# Patient Record
Sex: Male | Born: 1940
Health system: Southern US, Community
[De-identification: ages and names within clinical notes are randomized; demographics above are authoritative.]

## PROBLEM LIST (undated history)

## (undated) DIAGNOSIS — Z950 Presence of cardiac pacemaker: Secondary | ICD-10-CM

## (undated) DIAGNOSIS — K219 Gastro-esophageal reflux disease without esophagitis: Secondary | ICD-10-CM

## (undated) DIAGNOSIS — I499 Cardiac arrhythmia, unspecified: Secondary | ICD-10-CM

## (undated) DIAGNOSIS — M353 Polymyalgia rheumatica: Secondary | ICD-10-CM

## (undated) DIAGNOSIS — R413 Other amnesia: Secondary | ICD-10-CM

## (undated) DIAGNOSIS — E7800 Pure hypercholesterolemia, unspecified: Secondary | ICD-10-CM

## (undated) DIAGNOSIS — G709 Myoneural disorder, unspecified: Secondary | ICD-10-CM

## (undated) DIAGNOSIS — G4700 Insomnia, unspecified: Secondary | ICD-10-CM

## (undated) DIAGNOSIS — I2510 Atherosclerotic heart disease of native coronary artery without angina pectoris: Secondary | ICD-10-CM

## (undated) DIAGNOSIS — I10 Essential (primary) hypertension: Secondary | ICD-10-CM

## (undated) DIAGNOSIS — I509 Heart failure, unspecified: Secondary | ICD-10-CM

## (undated) DIAGNOSIS — I4891 Unspecified atrial fibrillation: Secondary | ICD-10-CM

## (undated) DIAGNOSIS — E119 Type 2 diabetes mellitus without complications: Secondary | ICD-10-CM

## (undated) DIAGNOSIS — M1990 Unspecified osteoarthritis, unspecified site: Secondary | ICD-10-CM

## (undated) HISTORY — PX: ESOPHAGOGASTRODUODENOSCOPY: SHX1529

## (undated) HISTORY — PX: CORONARY ANGIOPLASTY WITH STENT PLACEMENT: SHX49

## (undated) HISTORY — PX: CARDIAC CATHETERIZATION: SHX172

## (undated) HISTORY — DX: Pure hypercholesterolemia, unspecified: E78.00

## (undated) HISTORY — DX: Unspecified atrial fibrillation: I48.91

## (undated) HISTORY — PX: BACK SURGERY: SHX140

## (undated) HISTORY — PX: ATRIAL FIBRILLATION ABLATION: EP1191

## (undated) HISTORY — DX: Essential (primary) hypertension: I10

## (undated) HISTORY — DX: Polymyalgia rheumatica: M35.3

## (undated) HISTORY — DX: Gastro-esophageal reflux disease without esophagitis: K21.9

## (undated) HISTORY — DX: Other amnesia: R41.3

## (undated) HISTORY — PX: TONSILLECTOMY: SUR1361

## (undated) HISTORY — DX: Insomnia, unspecified: G47.00

## (undated) HISTORY — DX: Type 2 diabetes mellitus without complications: E11.9

## (undated) HISTORY — DX: Unspecified osteoarthritis, unspecified site: M19.90

---

## 2003-08-09 ENCOUNTER — Ambulatory Visit (HOSPITAL_COMMUNITY): Admission: RE | Admit: 2003-08-09 | Discharge: 2003-08-09 | Payer: Self-pay | Admitting: Cardiology

## 2003-12-09 ENCOUNTER — Inpatient Hospital Stay (HOSPITAL_COMMUNITY): Admission: AD | Admit: 2003-12-09 | Discharge: 2003-12-12 | Payer: Self-pay | Admitting: Internal Medicine

## 2004-07-21 ENCOUNTER — Ambulatory Visit: Payer: Self-pay

## 2004-08-03 ENCOUNTER — Ambulatory Visit: Payer: Self-pay | Admitting: Internal Medicine

## 2004-08-24 ENCOUNTER — Ambulatory Visit: Payer: Self-pay | Admitting: Internal Medicine

## 2004-08-24 ENCOUNTER — Ambulatory Visit: Payer: Self-pay | Admitting: Cardiovascular Disease

## 2004-09-10 ENCOUNTER — Ambulatory Visit: Payer: Self-pay | Admitting: Internal Medicine

## 2004-10-06 ENCOUNTER — Ambulatory Visit: Payer: Self-pay | Admitting: Internal Medicine

## 2004-10-08 ENCOUNTER — Ambulatory Visit: Payer: Self-pay | Admitting: Internal Medicine

## 2004-10-16 ENCOUNTER — Ambulatory Visit: Payer: Self-pay

## 2004-10-19 ENCOUNTER — Ambulatory Visit: Payer: Self-pay | Admitting: Cardiology

## 2004-10-20 ENCOUNTER — Ambulatory Visit (HOSPITAL_COMMUNITY): Admission: RE | Admit: 2004-10-20 | Discharge: 2004-10-20 | Payer: Self-pay | Admitting: Cardiovascular Disease

## 2004-10-20 ENCOUNTER — Ambulatory Visit: Payer: Self-pay | Admitting: Cardiology

## 2004-10-22 ENCOUNTER — Ambulatory Visit: Payer: Self-pay | Admitting: Internal Medicine

## 2004-10-28 ENCOUNTER — Ambulatory Visit: Payer: Self-pay | Admitting: Cardiology

## 2004-10-28 ENCOUNTER — Encounter: Admission: RE | Admit: 2004-10-28 | Discharge: 2004-10-28 | Payer: Self-pay | Admitting: Rheumatology

## 2004-11-04 ENCOUNTER — Ambulatory Visit: Payer: Self-pay | Admitting: Internal Medicine

## 2004-11-13 ENCOUNTER — Ambulatory Visit: Payer: Self-pay | Admitting: Internal Medicine

## 2004-11-19 ENCOUNTER — Ambulatory Visit: Payer: Self-pay | Admitting: Cardiology

## 2004-12-07 ENCOUNTER — Ambulatory Visit: Payer: Self-pay | Admitting: Cardiology

## 2004-12-28 ENCOUNTER — Ambulatory Visit: Payer: Self-pay | Admitting: Cardiology

## 2005-01-04 ENCOUNTER — Ambulatory Visit: Payer: Self-pay | Admitting: Cardiology

## 2005-01-11 ENCOUNTER — Ambulatory Visit: Payer: Self-pay | Admitting: *Deleted

## 2005-01-18 ENCOUNTER — Ambulatory Visit: Payer: Self-pay | Admitting: Cardiology

## 2005-01-28 ENCOUNTER — Ambulatory Visit: Payer: Self-pay | Admitting: Cardiology

## 2005-02-02 ENCOUNTER — Ambulatory Visit: Payer: Self-pay | Admitting: *Deleted

## 2005-02-16 ENCOUNTER — Ambulatory Visit: Payer: Self-pay | Admitting: Cardiovascular Disease

## 2005-02-18 ENCOUNTER — Ambulatory Visit: Payer: Self-pay | Admitting: Internal Medicine

## 2005-02-22 ENCOUNTER — Ambulatory Visit: Payer: Self-pay | Admitting: Cardiology

## 2005-03-02 ENCOUNTER — Ambulatory Visit: Payer: Self-pay | Admitting: Cardiology

## 2005-03-23 ENCOUNTER — Ambulatory Visit: Payer: Self-pay | Admitting: Cardiology

## 2005-04-14 ENCOUNTER — Ambulatory Visit: Payer: Self-pay | Admitting: Cardiology

## 2005-05-04 ENCOUNTER — Ambulatory Visit: Payer: Self-pay | Admitting: Cardiology

## 2005-06-01 ENCOUNTER — Ambulatory Visit: Payer: Self-pay | Admitting: *Deleted

## 2005-06-29 ENCOUNTER — Ambulatory Visit: Payer: Self-pay | Admitting: Cardiology

## 2005-07-01 ENCOUNTER — Ambulatory Visit: Payer: Self-pay | Admitting: Family Medicine

## 2005-07-06 ENCOUNTER — Ambulatory Visit: Payer: Self-pay | Admitting: Cardiology

## 2005-07-27 ENCOUNTER — Ambulatory Visit: Payer: Self-pay | Admitting: Cardiology

## 2005-08-27 ENCOUNTER — Ambulatory Visit: Payer: Self-pay | Admitting: Internal Medicine

## 2005-09-20 ENCOUNTER — Ambulatory Visit: Payer: Self-pay | Admitting: Internal Medicine

## 2005-10-08 ENCOUNTER — Ambulatory Visit: Payer: Self-pay | Admitting: Cardiology

## 2005-10-11 ENCOUNTER — Ambulatory Visit: Payer: Self-pay | Admitting: Family Medicine

## 2005-11-05 ENCOUNTER — Ambulatory Visit: Payer: Self-pay | Admitting: Internal Medicine

## 2005-12-06 ENCOUNTER — Ambulatory Visit: Payer: Self-pay | Admitting: Cardiology

## 2006-01-03 ENCOUNTER — Ambulatory Visit: Payer: Self-pay | Admitting: Cardiology

## 2006-01-19 ENCOUNTER — Ambulatory Visit: Payer: Self-pay | Admitting: Family Medicine

## 2006-02-02 ENCOUNTER — Ambulatory Visit: Payer: Self-pay | Admitting: Cardiovascular Disease

## 2006-02-14 ENCOUNTER — Ambulatory Visit: Payer: Self-pay | Admitting: Cardiology

## 2006-02-28 ENCOUNTER — Ambulatory Visit: Payer: Self-pay | Admitting: Cardiology

## 2006-03-15 ENCOUNTER — Ambulatory Visit: Payer: Self-pay | Admitting: Cardiology

## 2006-03-29 ENCOUNTER — Ambulatory Visit: Payer: Self-pay | Admitting: *Deleted

## 2006-04-13 ENCOUNTER — Ambulatory Visit: Payer: Self-pay | Admitting: Cardiology

## 2006-04-28 ENCOUNTER — Ambulatory Visit: Payer: Self-pay | Admitting: Cardiology

## 2006-05-23 ENCOUNTER — Ambulatory Visit: Payer: Self-pay | Admitting: Cardiology

## 2006-06-20 ENCOUNTER — Ambulatory Visit: Payer: Self-pay | Admitting: Cardiology

## 2006-07-04 ENCOUNTER — Ambulatory Visit: Payer: Self-pay | Admitting: Cardiology

## 2006-07-08 ENCOUNTER — Ambulatory Visit: Payer: Self-pay | Admitting: Family Medicine

## 2006-07-25 ENCOUNTER — Ambulatory Visit: Payer: Self-pay | Admitting: Cardiology

## 2006-09-08 ENCOUNTER — Ambulatory Visit: Payer: Self-pay | Admitting: Family Medicine

## 2006-09-13 ENCOUNTER — Ambulatory Visit: Payer: Self-pay | Admitting: Cardiology

## 2006-09-23 ENCOUNTER — Ambulatory Visit: Payer: Self-pay | Admitting: Cardiovascular Disease

## 2006-09-30 ENCOUNTER — Encounter: Payer: Self-pay | Admitting: Family Medicine

## 2006-10-04 ENCOUNTER — Ambulatory Visit: Payer: Self-pay | Admitting: Cardiology

## 2006-10-17 ENCOUNTER — Ambulatory Visit: Payer: Self-pay | Admitting: Cardiology

## 2006-11-29 ENCOUNTER — Ambulatory Visit: Payer: Self-pay | Admitting: Cardiology

## 2006-12-13 ENCOUNTER — Ambulatory Visit: Payer: Self-pay | Admitting: Cardiology

## 2006-12-20 ENCOUNTER — Ambulatory Visit: Payer: Self-pay | Admitting: *Deleted

## 2006-12-27 ENCOUNTER — Ambulatory Visit: Payer: Self-pay | Admitting: Cardiology

## 2007-01-09 ENCOUNTER — Ambulatory Visit: Payer: Self-pay | Admitting: Family Medicine

## 2007-05-01 ENCOUNTER — Ambulatory Visit: Payer: Self-pay | Admitting: Family Medicine

## 2007-05-01 DIAGNOSIS — I4891 Unspecified atrial fibrillation: Secondary | ICD-10-CM | POA: Insufficient documentation

## 2007-05-22 DIAGNOSIS — K219 Gastro-esophageal reflux disease without esophagitis: Secondary | ICD-10-CM | POA: Insufficient documentation

## 2007-05-22 DIAGNOSIS — I10 Essential (primary) hypertension: Secondary | ICD-10-CM | POA: Insufficient documentation

## 2007-05-30 ENCOUNTER — Ambulatory Visit: Payer: Self-pay | Admitting: Family Medicine

## 2007-06-01 LAB — CONVERTED CEMR LAB
ALT: 26 units/L (ref 0–53)
AST: 28 units/L (ref 0–37)
Albumin: 4 g/dL (ref 3.5–5.2)
Alkaline Phosphatase: 71 units/L (ref 39–117)
BUN: 12 mg/dL (ref 6–23)
Basophils Absolute: 0 10*3/uL (ref 0.0–0.1)
Basophils Relative: 0.1 % (ref 0.0–1.0)
Bilirubin, Direct: 0.2 mg/dL (ref 0.0–0.3)
CO2: 30 meq/L (ref 19–32)
Calcium: 9.9 mg/dL (ref 8.4–10.5)
Chloride: 106 meq/L (ref 96–112)
Cholesterol: 236 mg/dL (ref 0–200)
Creatinine, Ser: 0.9 mg/dL (ref 0.4–1.5)
Direct LDL: 144.1 mg/dL
Eosinophils Absolute: 0 10*3/uL (ref 0.0–0.6)
Eosinophils Relative: 0.5 % (ref 0.0–5.0)
GFR calc Af Amer: 109 mL/min
GFR calc non Af Amer: 90 mL/min
Glucose, Bld: 109 mg/dL — ABNORMAL HIGH (ref 70–99)
HCT: 41.1 % (ref 39.0–52.0)
HDL: 46.3 mg/dL (ref >39.0)
Hemoglobin: 14.4 g/dL (ref 13.0–17.0)
Lymphocytes Relative: 10.3 % — ABNORMAL LOW (ref 12.0–46.0)
MCHC: 35.1 g/dL (ref 30.0–36.0)
MCV: 100.7 fL — ABNORMAL HIGH (ref 78.0–100.0)
Monocytes Absolute: 0.5 10*3/uL (ref 0.2–0.7)
Monocytes Relative: 7.6 % (ref 3.0–11.0)
Neutro Abs: 5.8 10*3/uL (ref 1.4–7.7)
Neutrophils Relative %: 81.5 % — ABNORMAL HIGH (ref 43.0–77.0)
PSA: 0.69 ng/mL (ref 0.10–4.00)
Platelets: 174 10*3/uL (ref 150–400)
Potassium: 4.6 meq/L (ref 3.5–5.1)
RBC: 4.08 M/uL — ABNORMAL LOW (ref 4.22–5.81)
RDW: 12.1 % (ref 11.5–14.6)
Sodium: 142 meq/L (ref 135–145)
TSH: 0.76 microintl units/mL (ref 0.35–5.50)
Total Bilirubin: 1.1 mg/dL (ref 0.3–1.2)
Total CHOL/HDL Ratio: 5.1
Total Protein: 6 g/dL (ref 6.0–8.3)
Triglycerides: 162 mg/dL — ABNORMAL HIGH (ref 0–149)
VLDL: 32 mg/dL (ref 0–40)
WBC: 7 10*3/uL (ref 4.5–10.5)

## 2007-06-05 ENCOUNTER — Ambulatory Visit: Payer: Self-pay | Admitting: Family Medicine

## 2007-06-05 DIAGNOSIS — M353 Polymyalgia rheumatica: Secondary | ICD-10-CM | POA: Insufficient documentation

## 2007-06-09 ENCOUNTER — Ambulatory Visit: Payer: Self-pay | Admitting: Family Medicine

## 2007-06-27 ENCOUNTER — Ambulatory Visit: Payer: Self-pay | Admitting: Gastroenterology

## 2007-07-07 ENCOUNTER — Encounter: Payer: Self-pay | Admitting: Family Medicine

## 2007-07-07 ENCOUNTER — Encounter (INDEPENDENT_AMBULATORY_CARE_PROVIDER_SITE_OTHER): Payer: Self-pay | Admitting: *Deleted

## 2007-07-07 ENCOUNTER — Ambulatory Visit: Payer: Self-pay | Admitting: Gastroenterology

## 2007-07-07 ENCOUNTER — Encounter: Payer: Self-pay | Admitting: Gastroenterology

## 2007-07-07 DIAGNOSIS — D126 Benign neoplasm of colon, unspecified: Secondary | ICD-10-CM | POA: Insufficient documentation

## 2007-08-11 ENCOUNTER — Ambulatory Visit: Payer: Self-pay | Admitting: Family Medicine

## 2008-01-09 ENCOUNTER — Telehealth: Payer: Self-pay | Admitting: Family Medicine

## 2008-01-26 ENCOUNTER — Ambulatory Visit: Payer: Self-pay | Admitting: Family Medicine

## 2008-01-26 DIAGNOSIS — N318 Other neuromuscular dysfunction of bladder: Secondary | ICD-10-CM | POA: Insufficient documentation

## 2008-03-14 ENCOUNTER — Telehealth: Payer: Self-pay | Admitting: Family Medicine

## 2008-03-26 ENCOUNTER — Ambulatory Visit: Payer: Self-pay | Admitting: Family Medicine

## 2008-03-26 LAB — CONVERTED CEMR LAB
INR: 2.7
Prothrombin Time: 19.9 s

## 2008-04-25 ENCOUNTER — Ambulatory Visit: Payer: Self-pay | Admitting: Family Medicine

## 2008-04-25 LAB — CONVERTED CEMR LAB
INR: 3.1
Prothrombin Time: 21.3 s

## 2008-05-16 ENCOUNTER — Ambulatory Visit: Payer: Self-pay | Admitting: Family Medicine

## 2008-05-16 LAB — CONVERTED CEMR LAB
INR: 2
Prothrombin Time: 17.3 s

## 2008-05-30 ENCOUNTER — Ambulatory Visit: Payer: Self-pay | Admitting: Family Medicine

## 2008-05-30 LAB — CONVERTED CEMR LAB
INR: 2.2
Prothrombin Time: 18.1 s

## 2008-06-07 ENCOUNTER — Ambulatory Visit: Payer: Self-pay | Admitting: Family Medicine

## 2008-06-13 ENCOUNTER — Telehealth (INDEPENDENT_AMBULATORY_CARE_PROVIDER_SITE_OTHER): Payer: Self-pay | Admitting: *Deleted

## 2008-06-27 ENCOUNTER — Ambulatory Visit: Payer: Self-pay | Admitting: Family Medicine

## 2008-06-27 LAB — CONVERTED CEMR LAB
INR: 1.8
Prothrombin Time: 16.6 s

## 2008-07-11 ENCOUNTER — Ambulatory Visit: Payer: Self-pay | Admitting: Family Medicine

## 2008-07-11 LAB — CONVERTED CEMR LAB
INR: 2.6
Prothrombin Time: 19.7 s

## 2008-07-19 ENCOUNTER — Encounter: Payer: Self-pay | Admitting: Family Medicine

## 2008-07-19 ENCOUNTER — Encounter: Admission: RE | Admit: 2008-07-19 | Discharge: 2008-07-19 | Payer: Self-pay | Admitting: Family Medicine

## 2008-08-08 ENCOUNTER — Ambulatory Visit: Payer: Self-pay | Admitting: Family Medicine

## 2008-08-08 LAB — CONVERTED CEMR LAB
INR: 1.5
Prothrombin Time: 14.9 s

## 2008-08-22 ENCOUNTER — Ambulatory Visit: Payer: Self-pay | Admitting: Family Medicine

## 2008-08-22 LAB — CONVERTED CEMR LAB
INR: 2.6
Prothrombin Time: 19.7 s

## 2008-09-13 ENCOUNTER — Ambulatory Visit: Payer: Self-pay | Admitting: Family Medicine

## 2008-09-13 DIAGNOSIS — E785 Hyperlipidemia, unspecified: Secondary | ICD-10-CM | POA: Insufficient documentation

## 2008-09-17 LAB — CONVERTED CEMR LAB
ALT: 32 units/L (ref 0–53)
AST: 31 units/L (ref 0–37)
Albumin: 4.2 g/dL (ref 3.5–5.2)
Alkaline Phosphatase: 63 units/L (ref 39–117)
Bilirubin, Direct: 0.2 mg/dL (ref 0.0–0.3)
Cholesterol: 176 mg/dL (ref 0–200)
Creatinine,U: 178.4 mg/dL
HDL: 43.1 mg/dL (ref >39.0)
Hgb A1c MFr Bld: 6.2 % — ABNORMAL HIGH (ref 4.6–6.0)
LDL Cholesterol: 101 mg/dL — ABNORMAL HIGH (ref 0–99)
Microalb Creat Ratio: 8.4 mg/g (ref 0.0–30.0)
Microalb, Ur: 1.5 mg/dL (ref 0.0–1.9)
Total Bilirubin: 1.2 mg/dL (ref 0.3–1.2)
Total CHOL/HDL Ratio: 4.1
Total Protein: 6.3 g/dL (ref 6.0–8.3)
Triglycerides: 157 mg/dL — ABNORMAL HIGH (ref 0–149)
VLDL: 31 mg/dL (ref 0–40)

## 2008-09-19 ENCOUNTER — Ambulatory Visit: Payer: Self-pay | Admitting: Family Medicine

## 2008-09-19 LAB — CONVERTED CEMR LAB
INR: 2.7
Prothrombin Time: 19.9 s

## 2008-10-14 ENCOUNTER — Ambulatory Visit: Payer: Self-pay | Admitting: Family Medicine

## 2008-10-14 LAB — CONVERTED CEMR LAB
INR: 2.3
Prothrombin Time: 18.6 s

## 2008-11-05 ENCOUNTER — Encounter: Payer: Self-pay | Admitting: Family Medicine

## 2008-11-18 ENCOUNTER — Ambulatory Visit: Payer: Self-pay | Admitting: Family Medicine

## 2008-11-18 LAB — CONVERTED CEMR LAB
INR: 2
Prothrombin Time: 17.3 s

## 2008-12-12 ENCOUNTER — Ambulatory Visit: Payer: Self-pay | Admitting: Family Medicine

## 2008-12-12 LAB — CONVERTED CEMR LAB
INR: 1.6
Prothrombin Time: 15.4 s

## 2008-12-18 ENCOUNTER — Encounter: Payer: Self-pay | Admitting: Family Medicine

## 2009-01-03 ENCOUNTER — Ambulatory Visit: Payer: Self-pay | Admitting: Family Medicine

## 2009-01-03 LAB — CONVERTED CEMR LAB
INR: 2
Prothrombin Time: 17.6 s

## 2009-02-06 ENCOUNTER — Ambulatory Visit: Payer: Self-pay | Admitting: Family Medicine

## 2009-02-06 LAB — CONVERTED CEMR LAB
INR: 1.4
Prothrombin Time: 14.3 s

## 2009-04-07 ENCOUNTER — Ambulatory Visit: Payer: Self-pay | Admitting: Family Medicine

## 2009-04-07 ENCOUNTER — Telehealth (INDEPENDENT_AMBULATORY_CARE_PROVIDER_SITE_OTHER): Payer: Self-pay | Admitting: *Deleted

## 2009-04-07 LAB — CONVERTED CEMR LAB
INR: 2
Prothrombin Time: 17.6 s

## 2009-05-07 ENCOUNTER — Ambulatory Visit: Payer: Self-pay | Admitting: Family Medicine

## 2009-05-07 LAB — CONVERTED CEMR LAB
INR: 3.1
Prothrombin Time: 18.6 s

## 2009-05-09 ENCOUNTER — Ambulatory Visit: Payer: Self-pay | Admitting: Family Medicine

## 2009-05-13 ENCOUNTER — Encounter: Payer: Self-pay | Admitting: Family Medicine

## 2009-05-14 LAB — CONVERTED CEMR LAB
ALT: 58 units/L — ABNORMAL HIGH (ref 0–53)
AST: 42 units/L — ABNORMAL HIGH (ref 0–37)
Albumin: 4.1 g/dL (ref 3.5–5.2)
Alkaline Phosphatase: 86 units/L (ref 39–117)
Bilirubin, Direct: 0.1 mg/dL (ref 0.0–0.3)
Cholesterol: 224 mg/dL — ABNORMAL HIGH (ref 0–200)
Creatinine,U: 119.1 mg/dL
Direct LDL: 145.1 mg/dL
HDL: 42.9 mg/dL (ref >39.00)
Hgb A1c MFr Bld: 5.7 % (ref 4.6–6.5)
Microalb Creat Ratio: 8.4 mg/g (ref 0.0–30.0)
Microalb, Ur: 1 mg/dL (ref 0.0–1.9)
Total Bilirubin: 1.2 mg/dL (ref 0.3–1.2)
Total CHOL/HDL Ratio: 5
Total Protein: 6.4 g/dL (ref 6.0–8.3)
Triglycerides: 183 mg/dL — ABNORMAL HIGH (ref 0.0–149.0)
VLDL: 36.6 mg/dL (ref 0.0–40.0)

## 2009-05-20 ENCOUNTER — Telehealth: Payer: Self-pay | Admitting: Family Medicine

## 2009-05-29 ENCOUNTER — Ambulatory Visit: Payer: Self-pay | Admitting: Family Medicine

## 2009-05-29 LAB — CONVERTED CEMR LAB
INR: 3
Prothrombin Time: 21 s

## 2009-06-18 ENCOUNTER — Ambulatory Visit: Payer: Self-pay | Admitting: Family Medicine

## 2009-06-26 ENCOUNTER — Ambulatory Visit: Payer: Self-pay | Admitting: Family Medicine

## 2009-06-26 LAB — CONVERTED CEMR LAB
INR: 1.6
Prothrombin Time: 14.7 s

## 2009-07-10 ENCOUNTER — Ambulatory Visit: Payer: Self-pay | Admitting: Family Medicine

## 2009-07-11 ENCOUNTER — Telehealth: Payer: Self-pay | Admitting: Family Medicine

## 2009-07-15 ENCOUNTER — Ambulatory Visit: Payer: Self-pay | Admitting: Family Medicine

## 2009-07-15 LAB — CONVERTED CEMR LAB
INR: 2
Prothrombin Time: 17.6 s

## 2009-08-15 ENCOUNTER — Ambulatory Visit: Payer: Self-pay | Admitting: Family Medicine

## 2009-08-15 LAB — CONVERTED CEMR LAB
INR: 1.8
Prothrombin Time: 16.7 s

## 2009-09-02 ENCOUNTER — Encounter: Payer: Self-pay | Admitting: Family Medicine

## 2009-09-02 ENCOUNTER — Ambulatory Visit: Payer: Self-pay | Admitting: Family Medicine

## 2009-09-02 LAB — CONVERTED CEMR LAB
INR: 3.95
INR: 3.95 — ABNORMAL HIGH (ref <1.50)
Prothrombin Time: 28.1 s
Prothrombin Time: 28.1 s — ABNORMAL HIGH (ref 10.2–14.0)

## 2009-09-17 ENCOUNTER — Ambulatory Visit: Payer: Self-pay | Admitting: Family Medicine

## 2009-09-17 DIAGNOSIS — R071 Chest pain on breathing: Secondary | ICD-10-CM | POA: Insufficient documentation

## 2009-09-17 DIAGNOSIS — K4090 Unilateral inguinal hernia, without obstruction or gangrene, not specified as recurrent: Secondary | ICD-10-CM | POA: Insufficient documentation

## 2009-09-26 ENCOUNTER — Encounter: Payer: Self-pay | Admitting: Family Medicine

## 2009-09-29 ENCOUNTER — Encounter: Admission: RE | Admit: 2009-09-29 | Discharge: 2009-09-29 | Payer: Self-pay | Admitting: Surgery

## 2009-10-01 ENCOUNTER — Encounter: Payer: Self-pay | Admitting: Family Medicine

## 2009-10-02 ENCOUNTER — Encounter: Admission: RE | Admit: 2009-10-02 | Discharge: 2009-10-02 | Payer: Self-pay | Admitting: Surgery

## 2009-10-03 ENCOUNTER — Ambulatory Visit: Payer: Self-pay | Admitting: Family Medicine

## 2009-10-03 LAB — CONVERTED CEMR LAB
INR: 2.6
Prothrombin Time: 19.1 s

## 2009-10-09 ENCOUNTER — Encounter: Payer: Self-pay | Admitting: Family Medicine

## 2009-10-10 ENCOUNTER — Telehealth (INDEPENDENT_AMBULATORY_CARE_PROVIDER_SITE_OTHER): Payer: Self-pay | Admitting: *Deleted

## 2009-10-14 ENCOUNTER — Encounter: Payer: Self-pay | Admitting: Family Medicine

## 2009-10-31 ENCOUNTER — Ambulatory Visit: Payer: Self-pay | Admitting: Family Medicine

## 2009-10-31 LAB — CONVERTED CEMR LAB
INR: 2.9
Prothrombin Time: 20.5 s

## 2009-11-07 ENCOUNTER — Ambulatory Visit: Payer: Self-pay | Admitting: Family Medicine

## 2009-11-07 LAB — CONVERTED CEMR LAB: Blood Glucose, Fingerstick: 136

## 2009-11-10 ENCOUNTER — Ambulatory Visit: Payer: Self-pay | Admitting: Family Medicine

## 2009-11-10 DIAGNOSIS — IMO0002 Reserved for concepts with insufficient information to code with codable children: Secondary | ICD-10-CM | POA: Insufficient documentation

## 2009-11-11 ENCOUNTER — Telehealth: Payer: Self-pay | Admitting: Family Medicine

## 2009-11-11 LAB — CONVERTED CEMR LAB
ALT: 26 units/L (ref 0–53)
AST: 26 units/L (ref 0–37)
Albumin: 4.1 g/dL (ref 3.5–5.2)
Alkaline Phosphatase: 90 units/L (ref 39–117)
BUN: 16 mg/dL (ref 6–23)
Basophils Absolute: 0 10*3/uL (ref 0.0–0.1)
Basophils Relative: 0.2 % (ref 0.0–3.0)
Bilirubin, Direct: 0.1 mg/dL (ref 0.0–0.3)
CO2: 33 meq/L — ABNORMAL HIGH (ref 19–32)
Calcium: 9.1 mg/dL (ref 8.4–10.5)
Chloride: 105 meq/L (ref 96–112)
Creatinine, Ser: 0.8 mg/dL (ref 0.4–1.5)
Eosinophils Absolute: 0.1 10*3/uL (ref 0.0–0.7)
Eosinophils Relative: 0.7 % (ref 0.0–5.0)
GFR calc non Af Amer: 101.81 mL/min (ref >60)
Glucose, Bld: 106 mg/dL — ABNORMAL HIGH (ref 70–99)
HCT: 40.9 % (ref 39.0–52.0)
Hemoglobin: 14 g/dL (ref 13.0–17.0)
Hgb A1c MFr Bld: 5.7 % (ref 4.6–6.5)
Lymphocytes Relative: 14.5 % (ref 12.0–46.0)
Lymphs Abs: 1.2 10*3/uL (ref 0.7–4.0)
MCHC: 34.2 g/dL (ref 30.0–36.0)
MCV: 102.3 fL — ABNORMAL HIGH (ref 78.0–100.0)
Monocytes Absolute: 0.7 10*3/uL (ref 0.1–1.0)
Monocytes Relative: 9.1 % (ref 3.0–12.0)
Neutro Abs: 6 10*3/uL (ref 1.4–7.7)
Neutrophils Relative %: 75.5 % (ref 43.0–77.0)
Platelets: 163 10*3/uL (ref 150.0–400.0)
Potassium: 4.6 meq/L (ref 3.5–5.1)
RBC: 4 M/uL — ABNORMAL LOW (ref 4.22–5.81)
RDW: 12.8 % (ref 11.5–14.6)
Sodium: 142 meq/L (ref 135–145)
TSH: 0.46 microintl units/mL (ref 0.35–5.50)
Total Bilirubin: 0.6 mg/dL (ref 0.3–1.2)
Total Protein: 6.1 g/dL (ref 6.0–8.3)
Vitamin B-12: 360 pg/mL (ref 211–911)
WBC: 8 10*3/uL (ref 4.5–10.5)

## 2009-11-24 ENCOUNTER — Telehealth (INDEPENDENT_AMBULATORY_CARE_PROVIDER_SITE_OTHER): Payer: Self-pay | Admitting: *Deleted

## 2009-11-28 ENCOUNTER — Ambulatory Visit (HOSPITAL_COMMUNITY): Admission: RE | Admit: 2009-11-28 | Discharge: 2009-11-28 | Payer: Self-pay | Admitting: Surgery

## 2009-11-28 HISTORY — PX: INGUINAL HERNIA REPAIR: SUR1180

## 2009-12-09 ENCOUNTER — Ambulatory Visit: Payer: Self-pay | Admitting: Family Medicine

## 2009-12-09 LAB — CONVERTED CEMR LAB
INR: 1.7
Prothrombin Time: 16.1 s

## 2009-12-11 ENCOUNTER — Encounter: Payer: Self-pay | Admitting: Family Medicine

## 2010-01-06 ENCOUNTER — Ambulatory Visit (HOSPITAL_COMMUNITY): Admission: RE | Admit: 2010-01-06 | Discharge: 2010-01-06 | Payer: Self-pay | Admitting: Orthopedic Surgery

## 2010-01-21 ENCOUNTER — Ambulatory Visit: Payer: Self-pay | Admitting: Family Medicine

## 2010-01-21 LAB — CONVERTED CEMR LAB
INR: 2.1
Prothrombin Time: 17.8 s

## 2010-02-03 ENCOUNTER — Telehealth: Payer: Self-pay | Admitting: Family Medicine

## 2010-02-03 HISTORY — PX: KNEE ARTHROSCOPY: SHX127

## 2010-03-12 ENCOUNTER — Ambulatory Visit: Payer: Self-pay | Admitting: Family Medicine

## 2010-03-12 LAB — CONVERTED CEMR LAB: INR: 2.6

## 2010-05-07 ENCOUNTER — Ambulatory Visit: Payer: Self-pay | Admitting: Family Medicine

## 2010-05-07 LAB — CONVERTED CEMR LAB: INR: 2.6

## 2010-07-13 ENCOUNTER — Encounter: Payer: Self-pay | Admitting: Family Medicine

## 2010-07-13 ENCOUNTER — Ambulatory Visit: Payer: Self-pay | Admitting: Family Medicine

## 2010-07-13 DIAGNOSIS — N401 Enlarged prostate with lower urinary tract symptoms: Secondary | ICD-10-CM

## 2010-07-13 DIAGNOSIS — N138 Other obstructive and reflux uropathy: Secondary | ICD-10-CM | POA: Insufficient documentation

## 2010-07-14 ENCOUNTER — Encounter: Payer: Self-pay | Admitting: Family Medicine

## 2010-07-15 LAB — CONVERTED CEMR LAB
ALT: 21 units/L (ref 0–53)
AST: 26 units/L (ref 0–37)
Albumin: 4.2 g/dL (ref 3.5–5.2)
Alkaline Phosphatase: 86 units/L (ref 39–117)
BUN: 16 mg/dL (ref 6–23)
Basophils Absolute: 0 10*3/uL (ref 0.0–0.1)
Basophils Relative: 0.6 % (ref 0.0–3.0)
Bilirubin, Direct: 0.1 mg/dL (ref 0.0–0.3)
CO2: 28 meq/L (ref 19–32)
Calcium: 9.7 mg/dL (ref 8.4–10.5)
Chloride: 107 meq/L (ref 96–112)
Cholesterol: 212 mg/dL — ABNORMAL HIGH (ref 0–200)
Creatinine, Ser: 0.9 mg/dL (ref 0.4–1.5)
Creatinine,U: 59.2 mg/dL
Direct LDL: 146.8 mg/dL
Eosinophils Absolute: 0.2 10*3/uL (ref 0.0–0.7)
Eosinophils Relative: 2.7 % (ref 0.0–5.0)
GFR calc non Af Amer: 91.03 mL/min (ref >60)
Glucose, Bld: 84 mg/dL (ref 70–99)
HCT: 41.9 % (ref 39.0–52.0)
HDL: 49.2 mg/dL (ref >39.00)
Hemoglobin: 14.5 g/dL (ref 13.0–17.0)
Hgb A1c MFr Bld: 5.7 % (ref 4.6–6.5)
Lymphocytes Relative: 21.3 % (ref 12.0–46.0)
Lymphs Abs: 1.2 10*3/uL (ref 0.7–4.0)
MCHC: 34.7 g/dL (ref 30.0–36.0)
MCV: 100.8 fL — ABNORMAL HIGH (ref 78.0–100.0)
Microalb Creat Ratio: 4.1 mg/g (ref 0.0–30.0)
Microalb, Ur: 2.4 mg/dL — ABNORMAL HIGH (ref 0.0–1.9)
Monocytes Absolute: 0.7 10*3/uL (ref 0.1–1.0)
Monocytes Relative: 12.6 % — ABNORMAL HIGH (ref 3.0–12.0)
Neutro Abs: 3.7 10*3/uL (ref 1.4–7.7)
Neutrophils Relative %: 62.8 % (ref 43.0–77.0)
PSA: 0.91 ng/mL (ref 0.10–4.00)
Platelets: 166 10*3/uL (ref 150.0–400.0)
Potassium: 4.1 meq/L (ref 3.5–5.1)
RBC: 4.16 M/uL — ABNORMAL LOW (ref 4.22–5.81)
RDW: 13.1 % (ref 11.5–14.6)
Sodium: 142 meq/L (ref 135–145)
TSH: 1.81 microintl units/mL (ref 0.35–5.50)
Total Bilirubin: 1 mg/dL (ref 0.3–1.2)
Total CHOL/HDL Ratio: 4
Total Protein: 6.5 g/dL (ref 6.0–8.3)
Triglycerides: 91 mg/dL (ref 0.0–149.0)
VLDL: 18.2 mg/dL (ref 0.0–40.0)
WBC: 5.8 10*3/uL (ref 4.5–10.5)

## 2010-07-24 ENCOUNTER — Encounter: Payer: Self-pay | Admitting: Family Medicine

## 2010-07-28 ENCOUNTER — Ambulatory Visit: Payer: Self-pay | Admitting: Family Medicine

## 2010-07-28 LAB — CONVERTED CEMR LAB: INR: 1.7

## 2010-08-12 ENCOUNTER — Ambulatory Visit: Payer: Self-pay | Admitting: Family Medicine

## 2010-08-12 LAB — CONVERTED CEMR LAB: INR: 2.5

## 2010-09-22 ENCOUNTER — Encounter: Payer: Self-pay | Admitting: Family Medicine

## 2010-09-23 ENCOUNTER — Encounter
Admission: RE | Admit: 2010-09-23 | Discharge: 2010-09-23 | Payer: Self-pay | Source: Home / Self Care | Attending: Rheumatology | Admitting: Rheumatology

## 2010-09-25 ENCOUNTER — Telehealth: Payer: Self-pay | Admitting: Family Medicine

## 2010-09-29 ENCOUNTER — Encounter: Payer: Self-pay | Admitting: Family Medicine

## 2010-10-04 LAB — CONVERTED CEMR LAB
ALT: 30 units/L (ref 0–53)
ALT: 33 units/L (ref 0–53)
AST: 25 units/L (ref 0–37)
AST: 33 units/L (ref 0–37)
Albumin: 3.9 g/dL (ref 3.5–5.2)
Albumin: 4.2 g/dL (ref 3.5–5.2)
Alkaline Phosphatase: 60 units/L (ref 39–117)
Alkaline Phosphatase: 89 units/L (ref 39–117)
BUN: 10 mg/dL (ref 6–23)
BUN: 11 mg/dL (ref 6–23)
Basophils Absolute: 0 10*3/uL (ref 0.0–0.1)
Basophils Absolute: 0 10*3/uL (ref 0.0–0.1)
Basophils Relative: 0 % (ref 0.0–3.0)
Basophils Relative: 0.3 % (ref 0.0–3.0)
Bilirubin, Direct: 0.1 mg/dL (ref 0.0–0.3)
Bilirubin, Direct: 0.1 mg/dL (ref 0.0–0.3)
CO2: 31 meq/L (ref 19–32)
CO2: 33 meq/L — ABNORMAL HIGH (ref 19–32)
Calcium: 10 mg/dL (ref 8.4–10.5)
Calcium: 9.7 mg/dL (ref 8.4–10.5)
Chloride: 103 meq/L (ref 96–112)
Chloride: 106 meq/L (ref 96–112)
Cholesterol: 157 mg/dL (ref 0–200)
Cholesterol: 273 mg/dL (ref 0–200)
Creatinine, Ser: 0.9 mg/dL (ref 0.4–1.5)
Creatinine, Ser: 0.9 mg/dL (ref 0.4–1.5)
Creatinine,U: 91.4 mg/dL
Direct LDL: 164.4 mg/dL
Eosinophils Absolute: 0 10*3/uL (ref 0.0–0.7)
Eosinophils Absolute: 0.1 10*3/uL (ref 0.0–0.7)
Eosinophils Relative: 0.2 % (ref 0.0–5.0)
Eosinophils Relative: 1.3 % (ref 0.0–5.0)
GFR calc Af Amer: 108 mL/min
GFR calc non Af Amer: 88.96 mL/min (ref >60)
GFR calc non Af Amer: 89 mL/min
Glucose, Bld: 102 mg/dL — ABNORMAL HIGH (ref 70–99)
Glucose, Bld: 140 mg/dL — ABNORMAL HIGH (ref 70–99)
HCT: 42.5 % (ref 39.0–52.0)
HCT: 42.7 % (ref 39.0–52.0)
HDL: 49.2 mg/dL (ref >39.00)
HDL: 56.9 mg/dL (ref >39.0)
Hemoglobin: 14.7 g/dL (ref 13.0–17.0)
Hemoglobin: 15 g/dL (ref 13.0–17.0)
Hgb A1c MFr Bld: 5.7 % (ref 4.6–6.5)
LDL Cholesterol: 82 mg/dL (ref 0–99)
Lymphocytes Relative: 12.4 % (ref 12.0–46.0)
Lymphocytes Relative: 3.8 % — ABNORMAL LOW (ref 12.0–46.0)
Lymphs Abs: 0.9 10*3/uL (ref 0.7–4.0)
MCHC: 34.4 g/dL (ref 30.0–36.0)
MCHC: 35.3 g/dL (ref 30.0–36.0)
MCV: 101.7 fL — ABNORMAL HIGH (ref 78.0–100.0)
MCV: 103.4 fL — ABNORMAL HIGH (ref 78.0–100.0)
Microalb Creat Ratio: 16.4 mg/g (ref 0.0–30.0)
Microalb, Ur: 1.5 mg/dL (ref 0.0–1.9)
Monocytes Absolute: 0.5 10*3/uL (ref 0.1–1.0)
Monocytes Absolute: 0.6 10*3/uL (ref 0.1–1.0)
Monocytes Relative: 3.6 % (ref 3.0–12.0)
Monocytes Relative: 9 % (ref 3.0–12.0)
Neutro Abs: 12.7 10*3/uL — ABNORMAL HIGH (ref 1.4–7.7)
Neutro Abs: 5.4 10*3/uL (ref 1.4–7.7)
Neutrophils Relative %: 77.3 % — ABNORMAL HIGH (ref 43.0–77.0)
Neutrophils Relative %: 92.1 % — ABNORMAL HIGH (ref 43.0–77.0)
PSA: 0.71 ng/mL (ref 0.10–4.00)
PSA: 0.84 ng/mL (ref 0.10–4.00)
Platelets: 168 10*3/uL (ref 150–400)
Platelets: 174 10*3/uL (ref 150.0–400.0)
Potassium: 4.2 meq/L (ref 3.5–5.1)
Potassium: 4.4 meq/L (ref 3.5–5.1)
RBC: 4.13 M/uL — ABNORMAL LOW (ref 4.22–5.81)
RBC: 4.18 M/uL — ABNORMAL LOW (ref 4.22–5.81)
RDW: 12.2 % (ref 11.5–14.6)
RDW: 12.5 % (ref 11.5–14.6)
Sodium: 140 meq/L (ref 135–145)
Sodium: 144 meq/L (ref 135–145)
TSH: 0.56 microintl units/mL (ref 0.35–5.50)
TSH: 0.94 microintl units/mL (ref 0.35–5.50)
Total Bilirubin: 0.9 mg/dL (ref 0.3–1.2)
Total Bilirubin: 1.3 mg/dL — ABNORMAL HIGH (ref 0.3–1.2)
Total CHOL/HDL Ratio: 3
Total CHOL/HDL Ratio: 4.8
Total Protein: 6.2 g/dL (ref 6.0–8.3)
Total Protein: 6.7 g/dL (ref 6.0–8.3)
Triglycerides: 130 mg/dL (ref 0.0–149.0)
Triglycerides: 192 mg/dL — ABNORMAL HIGH (ref 0–149)
VLDL: 26 mg/dL (ref 0.0–40.0)
VLDL: 38 mg/dL (ref 0–40)
WBC: 13.7 10*3/uL — ABNORMAL HIGH (ref 4.5–10.5)
WBC: 7 10*3/uL (ref 4.5–10.5)

## 2010-10-06 NOTE — Assessment & Plan Note (Signed)
Summary: pt fell/hurt back/ribs/stomach/cjr   Vital Signs:  Patient profile:   70 year old male Weight:      175 pounds Temp:     97.9 degrees F oral Pulse rate:   76 / minute BP sitting:   118 / 66  (left arm) Cuff size:   regular  Vitals Entered By: Alfred Levins, CMA (September 17, 2009 11:48 AM) CC: fell down on 09/08/09 and now his stomach, back, and chest hurt   History of Present Illness: Here with his wife for several reasons. On 09-08-09 he was ice skating and he fell forwards, landing on his chest and abdomen. Since then he has had severe sharp pains in his anterior chest, both sides of his chest, and in the middle of his back. No SOB, but it hurts to breathe deeply. Also he has had a painful lump in the right groin since then. Urinates normally. On Tylenol for the pain.   Allergies: 1)  ! Codeine  Past History:  Past Medical History: Reviewed history from 09/13/2008 and no changes required. High Cholesterol Insomnia Urticaria GERD Hypertension Atrial fibrillation (Dr. Sampson Goon at [facility] and Dr. Ladona Ridgel at [facility]) - resolved. [facility]  polymyalgia rheumatica (Dr. Kellie Simmering) Diabetes mellitus, type II  Past Surgical History: Reviewed history from 06/05/2007 and no changes required. cardiac ablation x two  Review of Systems  The patient denies anorexia, fever, weight loss, weight gain, vision loss, decreased hearing, hoarseness, syncope, dyspnea on exertion, peripheral edema, prolonged cough, headaches, hemoptysis, melena, hematochezia, severe indigestion/heartburn, hematuria, incontinence, genital sores, muscle weakness, suspicious skin lesions, transient blindness, difficulty walking, depression, unusual weight change, abnormal bleeding, enlarged lymph nodes, angioedema, breast masses, and testicular masses.    Physical Exam  General:  in pain, alert Neck:  No deformities, masses, or tenderness noted. Lungs:  Normal respiratory effort, chest expands  symmetrically. Lungs are clear to auscultation, no crackles or wheezes. Heart:  Normal rate and regular rhythm. S1 and S2 normal without gallop, murmur, click, rub or other extra sounds. Abdomen:  soft, normal bowel sounds, no distention, no masses, no guarding, no rigidity, no rebound tenderness, no abdominal hernia, no hepatomegaly, and no splenomegaly.  Has a large mildly tender nonreducible right direct inguinal hernia.  Msk:  very tender around the middle thorax, in a complete band from the mid back to the lower sternal area, worst in both sides. No ecchymosis or crepitus. The spine and sternum seem okay   Impression & Recommendations:  Problem # 1:  INGUINAL HERNIA (ICD-550.90)  Orders: Surgical Referral (Surgery)  Problem # 2:  CHEST WALL PAIN, ACUTE (ICD-786.52)  Complete Medication List: 1)  Amlodipine Besylate 5 Mg Tabs (Amlodipine besylate) .Marland Kitchen.. 1 by mouth once daily 2)  Flecainide Acetate 50 Mg Tabs (Flecainide acetate) .... Take one-half two times a day 3)  Prednisone 10 Mg Tabs (Prednisone) .... 3 tabs once daily 4)  Multivitamins Tabs (Multiple vitamin) .Marland Kitchen.. 1 by mouth once daily 5)  Vitamin E 600 Unit Caps (Vitamin e) .Marland Kitchen.. 1 by mouth once daily 6)  Vitamin B Complex-c Caps (B complex-c) .Marland Kitchen.. 1 by mouth once daily 7)  Vitamin D3 1000 Unit Caps (Cholecalciferol) .... Take one tablet by mouth once a day 8)  Colchicine 0.6 Mg Tabs (Colchicine) .... Two times a day 9)  Crestor 10 Mg Tabs (Rosuvastatin calcium) .Marland Kitchen.. 1 tablet by mouth daily 10)  Coumadin 4 Mg Tabs (Warfarin sodium) .... Sunday - 5 mg, monday - 5 mg, tuesday -  5 mg, wednesday - 4 mg, thursday - 5 mg, friday - 5 mg, saturday - 5 mg 11)  Coumadin 1 Mg Tabs (Warfarin sodium) .... Sunday - 5 mg, monday - 5 mg, tuesday - 5 mg, wednesday - 4 mg, thursday - 5 mg, friday - 5 mg, saturday - 5 mg 12)  Vicodin 5-500 Mg Tabs (Hydrocodone-acetaminophen) .Marland Kitchen.. 1 q 4 hours as needed pain  Other Orders: T-Ribs Bilateral 4 Views  [phone])  Patient Instructions: 1)  He probably has some cracked ribs from the fall. Will send him for rib Xrays this afternoon. Rest. Use Vicodin for pain. Will refer to Surgery for the inguinal hernia.  Prescriptions: VICODIN 5-500 MG TABS (HYDROCODONE-ACETAMINOPHEN) 1 q 4 hours as needed pain  #60 x 2   Entered and Authorized by:   Nelwyn Salisbury MD   Signed by:   Nelwyn Salisbury MD on 09/17/2009   Method used:   Print then Give to Patient   RxID:   [id number]

## 2010-10-06 NOTE — Assessment & Plan Note (Signed)
Summary: PT WILL COME IN FASTING/NJR/pt req flu shot/cjr   Vital Signs:  Patient profile:   70 year old male Height:      65 inches Weight:      164 pounds O2 Sat:      98 % Temp:     97.4 degrees F Pulse rate:   56 / minute BP sitting:   120 / 80  (left arm)  Vitals Entered By: Pura Spice, RN (July 13, 2010 9:30 AM) CC: cpx fasting needs refills  fatigue    History of Present Illness: 70 yr old male for a cpx. His only complaint today is of fatigue for the past few months. No chest pains but he does get out of breath very easily when exerting himself. He and his wife think this may be due to stress. He has been very active in his church, and some of the activities he has been helping with are very stressful to him. Apparently a lot of politics are involved , and these situations bother him. In fact he will be retiring on 08-23-10, and he is looking forward to this.   Allergies: 1)  ! Codeine 2)  ! Vicodin  Past History:  Past Medical History: Reviewed history from 11/07/2009 and no changes required. High Cholesterol Insomnia Urticaria GERD Hypertension Atrial fibrillation (Dr. Eliane Decree  at [facility] and Dr. Ladona Ridgel at [facility]) - resolved. [facility] to [facility]  polymyalgia rheumatica (Dr. Kellie Simmering) Diabetes mellitus, type II  Past Surgical History: cardiac ablation x two at [facility] colonoscopy 07-06-10 per Dr. Wendall Papa, single polyp, repeat in 5 yrs right inguinal herniorrhaphy 11-28-09 per Dr. Manus Rudd left knee arthroscopy 01-06-10 per Dr. Worthy Rancher  Past History:  Care Management: Ophthalmology: Dr Alben Spittle Cardiology: Dr Rowan Blase  Family History: Reviewed history from 05/22/2007 and no changes required. Family History of CAD Male 1st degree relative <50 Family History Hypertension Family History of Cardiovascular disorder Family History of Neurological disorder Family History of Respiratory disease  Social History: Reviewed  history from 07/10/2009 and no changes required. Married Never Smoked Alcohol use-no Drug use-no Retired  Review of Systems  The patient denies anorexia, fever, weight loss, weight gain, vision loss, decreased hearing, hoarseness, chest pain, syncope, dyspnea on exertion, peripheral edema, prolonged cough, headaches, hemoptysis, abdominal pain, melena, hematochezia, severe indigestion/heartburn, hematuria, incontinence, genital sores, muscle weakness, suspicious skin lesions, transient blindness, difficulty walking, depression, unusual weight change, abnormal bleeding, enlarged lymph nodes, angioedema, breast masses, and testicular masses.         Flu Vaccine Consent Questions     Do you have a history of severe allergic reactions to this vaccine? no    Any prior history of allergic reactions to egg and/or gelatin? no    Do you have a sensitivity to the preservative Thimersol? no    Do you have a past history of Guillan-Barre Syndrome? no    Do you currently have an acute febrile illness? no    Have you ever had a severe reaction to latex? no    Vaccine information given and explained to patient? yes    Are you currently pregnant? no    Lot Number:[id number]   Exp Date:03/06/2011   Site Given  Left Deltoid IM Pura Spice, RN  July 13, 2010 10:14 AM   Physical Exam  General:  Well-developed,well-nourished,in no acute distress; alert,appropriate and cooperative throughout examination Head:  Normocephalic and atraumatic without obvious abnormalities. No apparent alopecia or  balding. Eyes:  No corneal or conjunctival inflammation noted. EOMI. Perrla. Funduscopic exam benign, without hemorrhages, exudates or papilledema. Vision grossly normal. Ears:  External ear exam shows no significant lesions or deformities.  Otoscopic examination reveals clear canals, tympanic membranes are intact bilaterally without bulging, retraction, inflammation or discharge. Hearing is grossly normal  bilaterally. Nose:  External nasal examination shows no deformity or inflammation. Nasal mucosa are pink and moist without lesions or exudates. Mouth:  Oral mucosa and oropharynx without lesions or exudates.  Teeth in good repair. Neck:  No deformities, masses, or tenderness noted. Chest Wall:  No deformities, masses, tenderness or gynecomastia noted. Lungs:  Normal respiratory effort, chest expands symmetrically. Lungs are clear to auscultation, no crackles or wheezes. Heart:  Normal rate and regular rhythm. S1 and S2 normal without gallop, murmur, click, rub or other extra sounds. EKG shows sinus rhythm but he has lateral T wave inversions which are new for him.  Abdomen:  Bowel sounds positive,abdomen soft and non-tender without masses, organomegaly or hernias noted. Rectal:  No external abnormalities noted. Normal sphincter tone. No rectal masses or tenderness. Genitalia:  Testes bilaterally descended without nodularity, tenderness or masses. No scrotal lesions. No penis lesions or urethral discharge. Small left hydrocele which is stable  Prostate:  Prostate gland firm and smooth, no enlargement, nodularity, tenderness, mass, asymmetry or induration. Msk:  No deformity or scoliosis noted of thoracic or lumbar spine.   Pulses:  R and L carotid,radial,femoral,dorsalis pedis and posterior tibial pulses are full and equal bilaterally Extremities:  No clubbing, cyanosis, edema, or deformity noted with normal full range of motion of all joints.   Neurologic:  No cranial nerve deficits noted. Station and gait are normal. Plantar reflexes are down-going bilaterally. DTRs are symmetrical throughout. Sensory, motor and coordinative functions appear intact. Skin:  Intact without suspicious lesions or rashes Cervical Nodes:  No lymphadenopathy noted Axillary Nodes:  No palpable lymphadenopathy Inguinal Nodes:  No significant adenopathy Psych:  Cognition and judgment appear intact. Alert and cooperative  with normal attention span and concentration. No apparent delusions, illusions, hallucinations   Impression & Recommendations:  Problem # 1:  DIABETES MELLITUS, TYPE II (ICD-250.00)  Orders: UA Dipstick w/o Micro (automated)  (81003) Venipuncture (57846) TLB-Lipid Panel (80061-LIPID) TLB-BMP (Basic Metabolic Panel-BMET) (80048-METABOL) TLB-CBC Platelet - w/Differential (85025-CBCD) TLB-Hepatic/Liver Function Pnl (80076-HEPATIC) TLB-TSH (Thyroid Stimulating Hormone) (84443-TSH) TLB-A1C / Hgb A1C (Glycohemoglobin) (83036-A1C) TLB-Microalbumin/Creat Ratio, Urine (82043-MALB)  Problem # 2:  HYPERLIPIDEMIA (ICD-272.4)  Problem # 3:  BENIGN PROSTATIC HYPERTROPHY, WITH OBSTRUCTION (ICD-600.01)  Orders: TLB-PSA (Prostate Specific Antigen) (84153-PSA)  Problem # 4:  POLYMYALGIA RHEUMATICA (ICD-725)  The following medications were removed from the medication list:    Prednisone 10 Mg Tabs (Prednisone) .Marland Kitchen... 1/2 once daily  Problem # 5:  HYPERTENSION (ICD-401.9)  His updated medication list for this problem includes:    Amlodipine Besylate 5 Mg Tabs (Amlodipine besylate) .Marland Kitchen... 1 by mouth once daily  Problem # 6:  ATRIAL FIBRILLATION, CHRONIC (ICD-427.31)  His updated medication list for this problem includes:    Amlodipine Besylate 5 Mg Tabs (Amlodipine besylate) .Marland Kitchen... 1 by mouth once daily    Flecainide Acetate 50 Mg Tabs (Flecainide acetate) .Marland Kitchen... Take one-half two times a day    Coumadin 5 Mg Tabs (Warfarin sodium) ..... Once daily  Orders: EKG w/ Interpretation (93000)  Complete Medication List: 1)  Amlodipine Besylate 5 Mg Tabs (Amlodipine besylate) .Marland Kitchen.. 1 by mouth once daily 2)  Flecainide Acetate 50 Mg Tabs (Flecainide acetate) .Marland KitchenMarland KitchenMarland Kitchen  Take one-half two times a day 3)  Multivitamins Tabs (Multiple vitamin) .Marland Kitchen.. 1 by mouth once daily 4)  Coumadin 5 Mg Tabs (Warfarin sodium) .... Once daily  Other Orders: Flu Vaccine 64yrs + MEDICARE PATIENTS (V4098) Administration Flu  vaccine - MCR (G0008) Tdap => 21yrs IM (11914) Admin 1st Vaccine (78295)  Patient Instructions: 1)  Get fasting labs today. His recent SOB and the EKG changes are worrisome for ischemia. We will have him see [facility] see him ASAP.  Prescriptions: COUMADIN 5 MG TABS (WARFARIN SODIUM) once daily  #90 x 3   Entered and Authorized by:   Nelwyn Salisbury MD   Signed by:   Nelwyn Salisbury MD on 07/13/2010   Method used:   Print then Give to Patient   RxID:   [id number] AMLODIPINE BESYLATE 5 MG  TABS (AMLODIPINE BESYLATE) 1 by mouth once daily  #90 x 3   Entered and Authorized by:   Nelwyn Salisbury MD   Signed by:   Nelwyn Salisbury MD on 07/13/2010   Method used:   Print then Give to Patient   RxID:   [id number]    Orders Added: 1)  Flu Vaccine 100yrs + MEDICARE PATIENTS [Q2039] 2)  Administration Flu vaccine - MCR [G0008] 3)  Tdap => 64yrs IM [90715] 4)  Admin 1st Vaccine [90471] 5)  Est. Patient Level IV [36644] 6)  UA Dipstick w/o Micro (automated)  [81003] 7)  EKG w/ Interpretation [93000] 8)  Venipuncture [36415] 9)  TLB-Lipid Panel [80061-LIPID] 10)  TLB-BMP (Basic Metabolic Panel-BMET) [80048-METABOL] 11)  TLB-CBC Platelet - w/Differential [85025-CBCD] 12)  TLB-Hepatic/Liver Function Pnl [80076-HEPATIC] 13)  TLB-TSH (Thyroid Stimulating Hormone) [84443-TSH] 14)  TLB-PSA (Prostate Specific Antigen) [84153-PSA] 15)  TLB-A1C / Hgb A1C (Glycohemoglobin) [83036-A1C] 16)  TLB-Microalbumin/Creat Ratio, Urine [82043-MALB]   Immunizations Administered:  Tetanus Vaccine:    Vaccine Type: Tdap    Site: right deltoid    Mfr: GlaxoSmithKline    Dose: 0.5 ml    Route: IM    Given by: Pura Spice, RN    Exp. Date: 06/25/2012    Lot #: [id number]    VIS given: 07/24/08 version given July 13, 2010.   Immunizations Administered:  Tetanus Vaccine:    Vaccine Type: Tdap    Site: right deltoid    Mfr: GlaxoSmithKline    Dose: 0.5 ml    Route: IM    Given by:  Pura Spice, RN    Exp. Date: 06/25/2012    Lot #: [id number]    VIS given: 07/24/08 version given July 13, 2010.   Eye Exam  last eye exam 2011 with Dr Alben Spittle    Appended Document: PT WILL COME IN FASTING/NJR/pt req flu shot/cjr appt with Dr Isabell Jarvis 07-14-10 at 2:20. left mess on pt vm and requested he return call to confirm appt.   Appended Document: Orders Update     Clinical Lists Changes  Orders: Added new Service order of Specimen Handling (51884) - Signed Observations: Added new observation of COMMENTS: Wynona Canes, CMA  July 13, 2010 12:31 PM  (07/13/2010 12:29) Added new observation of PH URINE: 6.0  (07/13/2010 12:29) Added new observation of SPEC GR URIN: 1.015  (07/13/2010 12:29) Added new observation of APPEARANCE U: Clear  (07/13/2010 12:29) Added new observation of UA COLOR: yellow  (07/13/2010 12:29) Added new observation of WBC DIPSTK U: negative  (07/13/2010 12:29) Added new observation of NITRITE URN: negative  (07/13/2010  12:29) Added new observation of UROBILINOGEN: 0.2  (07/13/2010 12:29) Added new observation of PROTEIN, URN: negative  (07/13/2010 12:29) Added new observation of BLOOD UR DIP: trace-lysed  (07/13/2010 12:29) Added new observation of KETONES URN: negative  (07/13/2010 12:29) Added new observation of BILIRUBIN UR: negative  (07/13/2010 12:29) Added new observation of GLUCOSE, URN: negative  (07/13/2010 12:29)      Laboratory Results   Urine Tests  Date/Time Recieved: July 13, 2010 12:31 PM  Date/Time Reported: July 13, 2010 12:30 PM   Routine Urinalysis   Color: yellow Appearance: Clear Glucose: negative   (Normal Range: Negative) Bilirubin: negative   (Normal Range: Negative) Ketone: negative   (Normal Range: Negative) Spec. Gravity: 1.015   (Normal Range: 1.003-1.035) Blood: trace-lysed   (Normal Range: Negative) pH: 6.0   (Normal Range: 5.0-8.0) Protein: negative   (Normal Range:  Negative) Urobilinogen: 0.2   (Normal Range: 0-1) Nitrite: negative   (Normal Range: Negative) Leukocyte Esterace: negative   (Normal Range: Negative)    Comments: Wynona Canes, CMA  July 13, 2010 12:31 PM

## 2010-10-06 NOTE — Progress Notes (Signed)
Summary: new pain med  Phone Note Call from Patient Call back at Home Phone [phone]   Caller: Spouse Call For: fry Summary of Call: pt broke out in a rash yesterday from the hydrocodone.  Rash went from stoach to groin and welped up a little.  Wife gave him benadryl and it is all cleared up now but she wants to know if you could call him in another pain med Initial call taken by: Alfred Levins, CMA,  November 11, 2009 4:00 PM  Follow-up for Phone Call        he saw Dr. Darrelyn Hillock yesterday for the knee. He needs to ask him for a pain med Follow-up by: Nelwyn Salisbury MD,  November 11, 2009 5:02 PM  Additional Follow-up for Phone Call Additional follow up Details #1::        pt aware Additional Follow-up by: Alfred Levins, CMA,  November 11, 2009 5:27 PM   New Allergies: ! VICODIN New Allergies: ! VICODIN

## 2010-10-06 NOTE — Assessment & Plan Note (Signed)
Summary: back pain/cdw   Vital Signs:  Patient profile:   70 year old male Weight:      174 pounds BMI:     29.28 Temp:     98.1 degrees F oral Pulse rate:   68 / minute Pulse rhythm:   regular BP sitting:   130 / 76  (left arm) Cuff size:   regular  Vitals Entered By: Raechel Ache, RN (November 10, 2009 11:29 AM) CC: Heard loud pop in L knee yesterday while running. C/o pain.   History of Present Illness: Here with his wife after he injured his left knee yesterday. While walking outside in the rain he felt a sudden sharp pain on the medial side of the left knee and heard a loud "pop". The knee has been painful ever since. No swelling. he has stayed off the leg since then and is using Vicodins for pain. He has no hx of knee problems.   Allergies: 1)  ! Codeine  Past History:  Past Medical History: Reviewed history from 11/07/2009 and no changes required. High Cholesterol Insomnia Urticaria GERD Hypertension Atrial fibrillation (Dr. Eliane Decree  at [facility] and Dr[facility] Ladona Ridgel at [facility]) - resolved. Goes to [facility]  polymyalgia rheumatica (Dr. Kellie Simmering) Diabetes mellitus, type II  Past Surgical History: Reviewed history from 11/07/2009 and no changes required. cardiac ablation x two at [facility]  Review of Systems  The patient denies anorexia, fever, weight loss, weight gain, vision loss, decreased hearing, hoarseness, chest pain, syncope, dyspnea on exertion, peripheral edema, prolonged cough, headaches, hemoptysis, abdominal pain, melena, hematochezia, severe indigestion/heartburn, hematuria, incontinence, genital sores, muscle weakness, suspicious skin lesions, transient blindness, difficulty walking, depression, unusual weight change, abnormal bleeding, enlarged lymph nodes, angioedema, breast masses, and testicular masses.    Physical Exam  General:  limping, in obvious pain Extremities:  the left knee shows no swelling but is very tender along the  medial joint space. Limited ROM. Positive McMurrays, and a negative anterior drawer sign.    Impression & Recommendations:  Problem # 1:  KNEE SPRAIN (ICD-844.9)  Complete Medication List: 1)  Amlodipine Besylate 5 Mg Tabs (Amlodipine besylate) .Marland Kitchen.. 1 by mouth once daily 2)  Flecainide Acetate 50 Mg Tabs (Flecainide acetate) .... Take one-half two times a day 3)  Prednisone 10 Mg Tabs (Prednisone) .... 1/2 once daily 4)  Multivitamins Tabs (Multiple vitamin) .Marland Kitchen.. 1 by mouth once daily 5)  Vitamin E 600 Unit Caps (Vitamin e) .Marland Kitchen.. 1 by mouth once daily 6)  Vitamin B Complex-c Caps (B complex-c) .Marland Kitchen.. 1 by mouth once daily 7)  Vitamin D3 1000 Unit Caps (Cholecalciferol) .... Take one tablet by mouth once a day 8)  Colchicine 0.6 Mg Tabs (Colchicine) .... Two times a day 9)  Vicodin 5-500 Mg Tabs (Hydrocodone-acetaminophen) .Marland Kitchen.. 1 q 4 hours as needed pain 10)  Warfarin Sodium 5 Mg Tabs (Warfarin sodium) .... Take 1 tab by mouth once daily as directed 11)  Warfarin Sodium 4 Mg Tabs (Warfarin sodium) .Marland Kitchen.. 1 tab by mouth daily as directed 12)  Coumadin 3 Mg Tabs (Warfarin sodium) .... Sunday - 4 mg, monday - 5 mg, tuesday - 5 mg, wednesday - 4 mg, thursday - 5 mg, friday - 5 mg, saturday - 5 mg  Other Orders: Orthopedic Referral (Ortho)  Patient Instructions: 1)  I think he has a tear in the medial meniscus. will have orthopedics see him today.

## 2010-10-06 NOTE — Assessment & Plan Note (Signed)
Summary: PT  Nurse Visit   Vitals Entered By: Payton Spark CMA (October 03, 2009 1:42 PM)  Allergies: 1)  ! Codeine Laboratory Results   Blood Tests     PT: 19.1 s   (Normal Range: 10.6-13.4)  INR: 2.6   (Normal Range: 0.88-1.12   Therap INR: 2.0-3.5)    Orders Added: 1)  Fingerstick [36416] 2)  Protime INR [16109]   Anticoagulation Management History:      The patient is on coumadin and comes in today for a routine follow up visit.  Anticoagulation is being administered due to chronic atrial fibrillation.  Plans are to keep the patient on coumadin for life.  His last INR was 3.95 and today's INR is 2.6.    Anticoagulation Management Assessment/Plan:      The target INR is 2.0-3.0.  He is to have a PT/INR in 4 weeks.  Anticoagulation instructions were given to patient.         Current Anticoagulation Instructions: The patient is to continue with the same dose of coumadin.  This dosage includes:   Repeat PT/INR in 4 weeks.

## 2010-10-06 NOTE — Progress Notes (Signed)
Summary: Coumadin  Phone Note Call from Patient   Caller: Patient Summary of Call: Pt going for surgery thurs and has stopped coumadin today. When should Pt restart coumadin. Once Pt recovers from this surgery, he will then go for knee surgery. Should Pt stay off coumadin until recovered from both surgeries? Pt called cards to make this call but cards advised that you should make the call because you have been monitoring this. Please advise. Initial call taken by: Payton Spark CMA,  November 24, 2009 2:34 PM  Follow-up for Phone Call        this is probably up to his surgeon due to bleeding risk. Follow-up by: Seymour Bars DO,  November 24, 2009 2:39 PM  Additional Follow-up for Phone Call Additional follow up Details #1::        Pt's wife aware Additional Follow-up by: Payton Spark CMA,  November 24, 2009 2:51 PM

## 2010-10-06 NOTE — Letter (Signed)
Summary: [facility]  [facility]   Imported By: Maryln Gottron 07/20/2010 13:33:52  _____________________________________________________________________  External Attachment:    Type:   Image     Comment:   External Document

## 2010-10-06 NOTE — Progress Notes (Signed)
  Phone Note Refill Request Message from:  Pharmacy on [facility] [address]  [phone]  Refills Requested: Medication #1:  WARFARIN SODIUM 5 MG TABS Take 1 tab by mouth once daily as directed Initial call taken by: Kandice Hams,  October 10, 2009 8:08 AM    Prescriptions: WARFARIN SODIUM 5 MG TABS (WARFARIN SODIUM) Take 1 tab by mouth once daily as directed  #30 x 2   Entered by:   Kandice Hams   Authorized by:   Seymour Bars DO   Signed by:   Kandice Hams on 10/10/2009   Method used:   Faxed to ...       [facility]  [address] [phone]* (retail)       [address]       [place], [place]  [address]       Ph: [phone] or [phone]       Fax: [phone]   RxID:   [id number]

## 2010-10-06 NOTE — Assessment & Plan Note (Signed)
Summary: Coumadin check  Nurse Visit   Vitals Entered By: Payton Spark CMA (July 28, 2010 8:51 AM)  Allergies: 1)  ! Codeine 2)  ! Vicodin Laboratory Results   Blood Tests      INR: 1.7   (Normal Range: 0.88-1.12   Therap INR: 2.0-3.5)    Orders Added: 1)  Fingerstick [36416] 2)  Protime [16109UE]   Anticoagulation Management History:      The patient is on coumadin and comes in today for a routine follow up visit.  Coumadin therapy is being given due to chronic atrial fibrillation.  Plans are to keep the patient on coumadin for life.  His last INR was 2.6 and today's INR is 1.7.    Anticoagulation Management Assessment/Plan:      The target INR is 2.0-3.0.  He is to have a PT/INR in 1 week.  Anticoagulation instructions were given to patient.         Current Anticoagulation Instructions: The patient's dosage of coumadin will be increased.  The new dosage includes: Coumadin 5 mg tabs and Coumadin 1 mg tabs:  Sunday - 5 mg, Monday - 5 mg, Tuesday - 6 mg, Wednesday - 5 mg, Thursday - 5 mg, Friday - 5 mg, Saturday - 6 mg.  Repeat PT/INR in 1 week.

## 2010-10-06 NOTE — Assessment & Plan Note (Signed)
Summary: BS CHECK BEFORE SURGERY FOR SYMPTOMS HE'S HAVING/PS   Vital Signs:  Patient profile:   70 year old male Height:      64.75 inches Weight:      173 pounds BMI:     29.12 Temp:     98.2 degrees F oral Pulse rate:   67 / minute BP sitting:   134 / 84  (left arm) Cuff size:   regular  Vitals Entered By: Alfred Levins, CMA (November 07, 2009 2:58 PM) CC: wants bs checked because he always fatigued CBG Result 136   History of Present Illness: Here with his wife with concerns over possible diabetes problems. he has not checked his glucoses at home because he does not have a monitor. His A1c last November was excellent at 5.7, but he admits to slipping a lot since then with his diet. He also has experienced a lot of fatigue and lack of energy over the past couple of months. He is scheduled for surgery to repair a right inguinal hernia on 11-28-09 per Dr. Manus Rudd. He saw Dr. Isabell Jarvis at [facility] a few weeks ago, and he was cleared for surgery from a Cardiology perspective. Of note he stopped taking Crestor about 2 months ago due to some mild muscle aches.  Allergies: 1)  ! Codeine  Past History:  Past Medical History: High Cholesterol Insomnia Urticaria GERD Hypertension Atrial fibrillation (Dr. Eliane Decree  at [facility] and Dr. Ladona Ridgel at [facility]) - resolved. [facility]  polymyalgia rheumatica (Dr. Kellie Simmering) Diabetes mellitus, type II  Past Surgical History: cardiac ablation x two at [facility]  Review of Systems  The patient denies anorexia, fever, weight loss, weight gain, vision loss, decreased hearing, hoarseness, chest pain, syncope, dyspnea on exertion, peripheral edema, prolonged cough, headaches, hemoptysis, abdominal pain, melena, hematochezia, severe indigestion/heartburn, hematuria, incontinence, genital sores, muscle weakness, suspicious skin lesions, transient blindness, difficulty walking, depression, unusual weight change, abnormal bleeding,  enlarged lymph nodes, angioedema, breast masses, and testicular masses.    Physical Exam  General:  Well-developed,well-nourished,in no acute distress; alert,appropriate and cooperative throughout examination Neck:  No deformities, masses, or tenderness noted. Lungs:  Normal respiratory effort, chest expands symmetrically. Lungs are clear to auscultation, no crackles or wheezes. Heart:  Normal rate and regular rhythm. S1 and S2 normal without gallop, murmur, click, rub or other extra sounds.   Impression & Recommendations:  Problem # 1:  DIABETES MELLITUS, TYPE II (ICD-250.00)  Orders: Capillary Blood Glucose/CBG (16109) UA Dipstick w/o Micro (automated)  (81003) Venipuncture ([address]454) TLB-BMP (Basic Metabolic Panel-BMET) (80048-METABOL) TLB-CBC Platelet - w/Differential (85025-CBCD) TLB-Hepatic/Liver Function Pnl (80076-HEPATIC) TLB-TSH (Thyroid Stimulating Hormone) (84443-TSH) TLB-A1C / Hgb A1C (Glycohemoglobin) (83036-A1C) TLB-B12, Serum-Total ONLY (09811-B14)  Problem # 2:  INGUINAL HERNIA (ICD-550.90)  Problem # 3:  HYPERTENSION (ICD-401.9)  His updated medication list for this problem includes:    Amlodipine Besylate 5 Mg Tabs (Amlodipine besylate) .Marland Kitchen... 1 by mouth once daily  Complete Medication List: 1)  Amlodipine Besylate 5 Mg Tabs (Amlodipine besylate) .Marland Kitchen.. 1 by mouth once daily 2)  Flecainide Acetate 50 Mg Tabs (Flecainide acetate) .... Take one-half two times a day 3)  Prednisone 10 Mg Tabs (Prednisone) .... 3 tabs once daily 4)  Multivitamins Tabs (Multiple vitamin) .Marland Kitchen.. 1 by mouth once daily 5)  Vitamin E 600 Unit Caps (Vitamin e) .Marland Kitchen.. 1 by mouth once daily 6)  Vitamin B Complex-c Caps (B complex-c) .Marland Kitchen.. 1 by mouth once daily 7)  Vitamin D3 1000 Unit  Caps (Cholecalciferol) .... Take one tablet by mouth once a day 8)  Colchicine 0.6 Mg Tabs (Colchicine) .... Two times a day 9)  Vicodin 5-500 Mg Tabs (Hydrocodone-acetaminophen) .Marland Kitchen.. 1 q 4 hours as needed pain 10)   Warfarin Sodium 5 Mg Tabs (Warfarin sodium) .... Take 1 tab by mouth once daily as directed 11)  Warfarin Sodium 4 Mg Tabs (Warfarin sodium) .Marland Kitchen.. 1 tab by mouth daily as directed 12)  Coumadin 3 Mg Tabs (Warfarin sodium) .... Sunday - 4 mg, monday - 5 mg, tuesday - 5 mg, wednesday - 4 mg, thursday - 5 mg, friday - 5 mg, saturday - 5 mg  Patient Instructions: 1)  I am not sure how to explain his recent fatigue, but diabetes is certainly a possibility. Get labs today including an A1c. I wrote for him to get a glucose monitor so he can follow his glucose levels at home.   Appended Document: BS CHECK BEFORE SURGERY FOR SYMPTOMS HE'S HAVING/PS  Laboratory Results   Urine Tests    Routine Urinalysis   Color: yellow Appearance: Clear Glucose: negative   (Normal Range: Negative) Bilirubin: negative   (Normal Range: Negative) Ketone: negative   (Normal Range: Negative) Spec. Gravity: 1.025   (Normal Range: 1.003-1.035) Blood: negative   (Normal Range: Negative) pH: 5.0   (Normal Range: 5.0-8.0) Protein: negative   (Normal Range: Negative) Urobilinogen: 0.2   (Normal Range: 0-1) Nitrite: negative   (Normal Range: Negative) Leukocyte Esterace: negative   (Normal Range: Negative)    Comments: Rita Ohara  November 07, 2009 5:35 PM

## 2010-10-06 NOTE — Assessment & Plan Note (Signed)
Summary: PT CHECK  Nurse Visit   Allergies: 1)  ! Codeine 2)  ! Vicodin Laboratory Results   Blood Tests   Date/Time Received: 05/07/10 Date/Time Reported: 05/07/10   INR: 2.6   (Normal Range: 0.88-1.12   Therap INR: 2.0-3.5)    Orders Added: 1)  Fingerstick [36416] 2)  Protime INR [85610]   Anticoagulation Management History:      The patient is on coumadin and comes in today for a routine follow up visit.  He is being anticoagulated due to chronic atrial fibrillation.  Plans are to keep the patient on coumadin for life.  His last INR was 2.6 and today's INR is 2.6.    Anticoagulation Management Assessment/Plan:      The target INR is 2.0-3.0.  He is to have a PT/INR in 6 weeks.  Anticoagulation instructions were given to patient.         Current Anticoagulation Instructions: The patient is to continue with the same dose of coumadin.  This dosage includes: Coumadin 5 mg tabs:  Sunday - 5 mg, Monday - 5 mg, Tuesday - 5 mg, Wednesday - 5 mg, Thursday - 5 mg, Friday - 5 mg, Saturday - 5 mg.  Repeat PT/INR in 6 weeks.

## 2010-10-06 NOTE — Assessment & Plan Note (Signed)
Summary: Coumadin check - jr  Nurse Visit   Vitals Entered By: Payton Spark CMA (August 12, 2010 10:49 AM)  Allergies: 1)  ! Codeine 2)  ! Vicodin Laboratory Results   Blood Tests      INR: 2.5   (Normal Range: 0.88-1.12   Therap INR: 2.0-3.5)    Orders Added: 1)  Fingerstick [36416] 2)  Protime [10626RS]   Anticoagulation Management History:      The patient is on coumadin and comes in today for a routine follow up visit.  Coumadin therapy is being given due to chronic atrial fibrillation.  Plans are to keep the patient on coumadin for life.  His last INR was 1.7 and today's INR is 2.5.    Anticoagulation Management Assessment/Plan:      The target INR is 2.0-3.0.  He is to have a PT/INR in 6 weeks.  Anticoagulation instructions were given to patient.         Current Anticoagulation Instructions: The patient is to continue with the same dose of coumadin.  This dosage includes:   Coumadin 5 mg tabs and Coumadin 1 mg tabs:  Sunday - 5 mg, Monday - 5 mg, Tuesday - 6 mg, Wednesday - 5 mg, Thursday - 5 mg, Friday - 5 mg, Saturday - 6 mg.    Repeat PT/INR in 6 weeks.     Appended Document: Coumadin check - jr [facility] informing Pt of the above.

## 2010-10-06 NOTE — Consult Note (Signed)
Summary: [facility]   Imported By: Maryln Gottron 10/08/2009 12:45:06  _____________________________________________________________________  External Attachment:    Type:   Image     Comment:   External Document

## 2010-10-06 NOTE — Progress Notes (Signed)
Summary: BS  Phone Note Call from Patient Call back at Home Phone [phone]   Reason for Call: Talk to Nurse Summary of Call: New meter.  Wants to know a good number to get. 70 - 100 fasting first thing in am.  Rudy Jew, RN  Feb 03, 2010 4:57 PM  Initial call taken by: Rudy Jew, RN,  Feb 03, 2010 2:58 PM

## 2010-10-06 NOTE — Letter (Signed)
Summary: [facility]   Imported By: Sherian Rein 02/14/2010 10:34:38  _____________________________________________________________________  External Attachment:    Type:   Image     Comment:   External Document

## 2010-10-06 NOTE — Letter (Signed)
Summary: Discharge Letter/[facility]  Discharge Letter/[facility]   Imported By: Maryln Gottron 08/06/2010 11:01:51  _____________________________________________________________________  External Attachment:    Type:   Image     Comment:   External Document

## 2010-10-06 NOTE — Assessment & Plan Note (Signed)
Summary: PT CHECK//VGJ  Nurse Visit   Allergies: 1)  ! Codeine Laboratory Results   Blood Tests   Date/Time Received: 10/31/2009 Date/Time Reported: 10/31/2009  PT: 20.5 s   (Normal Range: 10.6-13.4)  INR: 2.9   (Normal Range: 0.88-1.12   Therap INR: 2.0-3.5)    Orders Added: 1)  Fingerstick [36416] 2)  Protime INR [16109]   Anticoagulation Management History:      The patient is on coumadin and comes in today for a routine follow up visit.  Anticoagulation is being administered due to chronic atrial fibrillation.  Plans are to keep the patient on coumadin for life.  His last INR was 2.6 and today's INR is 2.9.    Anticoagulation Management Assessment/Plan:      The target INR is 2.0-3.0.  He is to have a PT/INR in 4 weeks.  Anticoagulation instructions were given to patient.         Current Anticoagulation Instructions: The patient's dosage of coumadin will be decreased.  The new dosage includes:  Warfarin sodium 5 mg tabs: Take 1 tab by mouth once daily as directed Coumadin 1 mg tabs and Coumadin 3 mg tabs:  Sunday - 4 mg, Monday - 5 mg, Tuesday - 5 mg, Wednesday - 4 mg, Thursday - 5 mg, Friday - 5 mg, Saturday - 5 mg.   Repeat PT/INR in 4 weeks.     Appended Document: PT/ INR Prescriptions: WARFARIN SODIUM 4 MG TABS (WARFARIN SODIUM) 1 tab by mouth daily as directed  #30 x 2   Entered and Authorized by:     DO   Signed by:     DO on 10/31/2009   Method used:   Electronically to        [id number] (ret[facility])       [address]       [place], [place]  [address]       Ph: [phone] or [phone]       Fax: [phone]   RxID:   [id number]

## 2010-10-06 NOTE — Letter (Signed)
Summary: [facility]  [facility]   Imported By: Maryln Gottron 10/20/2009 12:26:13  _____________________________________________________________________  External Attachment:    Type:   Image     Comment:   External Document

## 2010-10-06 NOTE — Letter (Signed)
Summary: [facility]  [facility]   Imported By: Maryln Gottron 10/07/2009 14:54:17  _____________________________________________________________________  External Attachment:    Type:   Image     Comment:   External Document

## 2010-10-06 NOTE — Letter (Signed)
Summary: [facility]  [facility]   Imported By: Maryln Gottron 07/29/2010 13:07:32  _____________________________________________________________________  External Attachment:    Type:   Image     Comment:   External Document

## 2010-10-06 NOTE — Assessment & Plan Note (Signed)
Summary: Coumadin check - jr  Nurse Visit   Allergies: 1)  ! Codeine 2)  ! Vicodin Laboratory Results   Blood Tests   Date/Time Received: 01/21/2010 Date/Time Reported: 01/21/2010  PT: 17.8 s   (Normal Range: 10.6-13.4)  INR: 2.1   (Normal Range: 0.88-1.12   Therap INR: 2.0-3.5)    Orders Added: 1)  Fingerstick [36416] 2)  Protime INR [60737]   Anticoagulation Management History:      The patient is on coumadin and comes in today for a routine follow up visit.  The patient is on Coumadin for chronic atrial fibrillation.  Plans are to keep the patient on coumadin for life.  His last INR was 1.7 and today's INR is 2.1.    Anticoagulation Management Assessment/Plan:      The target INR is 2.0-3.0.  He is to have a PT/INR in 4 weeks.  Anticoagulation instructions were given to patient.         Current Anticoagulation Instructions: The patient is to continue with the same dose of coumadin.  This dosage includes:   Warfarin sodium 5 mg tabs: Take 1 tab by mouth once daily as directed Warfarin sodium 4 mg tabs: 1 tab by mouth daily as directed Coumadin 5 mg tabs:  Sunday - 5 mg, Monday - 5 mg, Tuesday - 5 mg, Wednesday - 5 mg, Thursday - 5 mg, Friday - 5 mg, Saturday - 5 mg.  Repeat PT/INR in 4 weeks.

## 2010-10-06 NOTE — Assessment & Plan Note (Signed)
Summary: PT/ INR  Nurse Visit   Vitals Entered By: Payton Spark CMA (December 09, 2009 1:47 PM)  Allergies: 1)  ! Codeine 2)  ! Vicodin Laboratory Results   Blood Tests     PT: 16.1 s   (Normal Range: 10.6-13.4)  INR: 1.7   (Normal Range: 0.88-1.12   Therap INR: 2.0-3.5)    Orders Added: 1)  Fingerstick [36416] 2)  Protime INR [16109]   Anticoagulation Management History:      The patient is on coumadin and comes in today for a routine follow up visit.  The patient is taking Coumadin for chronic atrial fibrillation.  Plans are to keep the patient on coumadin for life.  His last INR was 2.9 and today's INR is 1.7.    Anticoagulation Management Assessment/Plan:      The target INR is 2.0-3.0.  He is to have a PT/INR in 4 weeks.  Anticoagulation instructions were given to patient.         Current Anticoagulation Instructions: The patient's dosage of coumadin will be increased.  The new dosage includes:  Warfarin sodium 5 mg tabs: Take 1 tab by mouth once daily as directed Warfarin sodium 4 mg tabs: 1 tab by mouth daily as directed Coumadin 3 mg tabs:  Sunday - 4 mg, Monday - 5 mg, Tuesday - 5 mg, Wednesday - 4 mg, Thursday - 5 mg, Friday - 5 mg, Saturday - 5 mg.  Repeat PT/INR in 4 weeks.

## 2010-10-06 NOTE — Letter (Signed)
Summary: [facility]   Imported By: Maryln Gottron 01/05/2010 11:03:59  _____________________________________________________________________  External Attachment:    Type:   Image     Comment:   External Document

## 2010-10-06 NOTE — Letter (Signed)
Summary: [facility]   Imported By: Maryln Gottron 10/27/2009 15:14:34  _____________________________________________________________________  External Attachment:    Type:   Image     Comment:   External Document

## 2010-10-06 NOTE — Assessment & Plan Note (Signed)
Summary: coumadin check - jr  Nurse Visit   Vitals Entered By: Payton Spark CMA (March 12, 2010 11:10 AM)  Allergies: 1)  ! Codeine 2)  ! Vicodin Laboratory Results   Blood Tests      INR: 2.6   (Normal Range: 0.88-1.12   Therap INR: 2.0-3.5)    Orders Added: 1)  Fingerstick [36416] 2)  Protime INR [16109]   Anticoagulation Management History:      The patient is on coumadin and comes in today for a routine follow up visit.  The patient is on Coumadin for chronic atrial fibrillation.  Plans are to keep the patient on coumadin for life.  His last INR was 2.1 and today's INR is 2.6.    Anticoagulation Management Assessment/Plan:      The target INR is 2.0-3.0.  He is to have a PT/INR in 4 weeks.  Anticoagulation instructions were given to patient.         Current Anticoagulation Instructions: The patient is to continue with the same dose of coumadin.  This dosage includes:   Coumadin 5 mg tabs:  Sunday - 5 mg, Monday - 5 mg, Tuesday - 5 mg, Wednesday - 5 mg, Thursday - 5 mg, Friday - 5 mg, Saturday - 5 mg.    Repeat PT/INR in 4 weeks.

## 2010-10-08 NOTE — Progress Notes (Signed)
Summary: Pt called and said his cardiologis is req that he gets labs done  Phone Note Call from Patient Call back at Home Phone [phone]   Caller: Patient Summary of Call: Pt said that his heart doctor is req that the pt get some lab work done to check new bp med. Pt is req to get labs done during the week of 09/28/10. pls advise.  Initial call taken by: Lucy Antigua,  September 25, 2010 11:02 AM  Follow-up for Phone Call        NO, his cardiologist will need to order these labs himself  Follow-up by: Nelwyn Salisbury MD,  September 25, 2010 4:26 PM  Additional Follow-up for Phone Call Additional follow up Details #1::        Notified pt. Additional Follow-up by: Lynann Beaver CMA AAMA,  September 28, 2010 8:11 AM     Appended Document: Pt called and said his cardiologis is req that he gets labs done pt called back left messs vm requesting last labs be mailed to home address so will have for Dr at [facility]   Appended Document: Pt called and said his cardiologis is req that he gets labs done mailed as requested (labs)

## 2010-10-08 NOTE — Letter (Signed)
Summary: Appointments, Instructions, Medications/[facility]  Appointments, Instructions, Medications/[facility]   Imported By: Maryln Gottron 08/20/2010 08:58:49  _____________________________________________________________________  External Attachment:    Type:   Image     Comment:   External Document

## 2010-10-12 ENCOUNTER — Encounter: Payer: Self-pay | Admitting: Family Medicine

## 2010-10-12 ENCOUNTER — Other Ambulatory Visit (INDEPENDENT_AMBULATORY_CARE_PROVIDER_SITE_OTHER): Payer: MEDICARE

## 2010-10-12 ENCOUNTER — Telehealth (INDEPENDENT_AMBULATORY_CARE_PROVIDER_SITE_OTHER): Payer: Self-pay | Admitting: *Deleted

## 2010-10-12 DIAGNOSIS — Z7901 Long term (current) use of anticoagulants: Secondary | ICD-10-CM

## 2010-10-12 DIAGNOSIS — I4891 Unspecified atrial fibrillation: Secondary | ICD-10-CM

## 2010-10-14 NOTE — Letter (Signed)
Summary: [facility]  [facility]   Imported By: Maryln Gottron 10/06/2010 14:46:33  _____________________________________________________________________  External Attachment:    Type:   Image     Comment:   External Document

## 2010-10-22 NOTE — Assessment & Plan Note (Signed)
Summary: INR  Nurse Visit   Vitals Entered By: Payton Spark CMA (October 12, 2010 2:12 PM)  Allergies: 1)  ! Codeine 2)  ! Vicodin Laboratory Results   Blood Tests      INR: 1.8   (Normal Range: 0.88-1.12   Therap INR: 2.0-3.5)    Orders Added: 1)  Fingerstick [36416] 2)  Protime [21308MV]   Anticoagulation Management History:      The patient is on coumadin and comes in today for a routine follow up visit.  Anticoagulation is being administered due to chronic atrial fibrillation.  Plans are to keep the patient on coumadin for life.  His last INR was 2.5 and today's INR is 1.8.    Anticoagulation Management Assessment/Plan:      The target INR is 2.0-3.0.  He is to have a PT/INR in 4 weeks.  Anticoagulation instructions were given to patient.         Current Anticoagulation Instructions: The patient's dosage of coumadin will be increased.  The new dosage includes:   Coumadin 5 mg tabs and Coumadin 1 mg tabs:  Sunday - 6 mg, Monday - 5 mg, Tuesday - 6 mg, Wednesday - 5 mg, Thursday - 6 mg, Friday - 5 mg, Saturday - 6 mg.    Repeat PT/INR in 4 weeks.

## 2010-10-22 NOTE — Progress Notes (Signed)
       New/Updated Medications: LISINOPRIL 10 MG TABS (LISINOPRIL) Take 1 tab by mouth once daily VICODIN 5-500 MG TABS (HYDROCODONE-ACETAMINOPHEN) Take as directed as needed

## 2010-11-20 ENCOUNTER — Ambulatory Visit: Payer: MEDICARE

## 2010-11-24 LAB — COMPREHENSIVE METABOLIC PANEL
ALT: 30 U/L (ref 0–53)
Albumin: 4.3 g/dL (ref 3.5–5.2)
Alkaline Phosphatase: 92 U/L (ref 39–117)
Chloride: 105 mEq/L (ref 96–112)
Potassium: 4.5 mEq/L (ref 3.5–5.1)
Sodium: 140 mEq/L (ref 135–145)
Total Bilirubin: 0.7 mg/dL (ref 0.3–1.2)
Total Protein: 6.7 g/dL (ref 6.0–8.3)

## 2010-11-24 LAB — CBC
Hemoglobin: 14.2 g/dL (ref 13.0–17.0)
Platelets: 170 10*3/uL (ref 150–400)
RDW: 13.2 % (ref 11.5–15.5)
WBC: 7.4 10*3/uL (ref 4.0–10.5)

## 2010-11-24 LAB — GLUCOSE, CAPILLARY: Glucose-Capillary: 106 mg/dL — ABNORMAL HIGH (ref 70–99)

## 2010-11-24 LAB — URINALYSIS, ROUTINE W REFLEX MICROSCOPIC
Bilirubin Urine: NEGATIVE
Nitrite: NEGATIVE
Protein, ur: NEGATIVE mg/dL
Specific Gravity, Urine: 1.021 (ref 1.005–1.030)
Urobilinogen, UA: 0.2 mg/dL (ref 0.0–1.0)

## 2010-11-24 LAB — PROTIME-INR
INR: 1.14 (ref 0.00–1.49)
INR: 1.45 (ref 0.00–1.49)
Prothrombin Time: 14.5 seconds (ref 11.6–15.2)
Prothrombin Time: 17.5 seconds — ABNORMAL HIGH (ref 11.6–15.2)

## 2010-11-24 LAB — DIFFERENTIAL
Lymphs Abs: 0.6 10*3/uL — ABNORMAL LOW (ref 0.7–4.0)
Monocytes Relative: 8 % (ref 3–12)
Neutro Abs: 6.1 10*3/uL (ref 1.7–7.7)
Neutrophils Relative %: 82 % — ABNORMAL HIGH (ref 43–77)

## 2010-11-24 LAB — APTT: aPTT: 29 seconds (ref 24–37)

## 2010-11-29 LAB — DIFFERENTIAL
Basophils Relative: 0 % (ref 0–1)
Lymphocytes Relative: 10 % — ABNORMAL LOW (ref 12–46)
Monocytes Absolute: 0.8 10*3/uL (ref 0.1–1.0)
Neutro Abs: 6.2 10*3/uL (ref 1.7–7.7)
Neutrophils Relative %: 78 % — ABNORMAL HIGH (ref 43–77)

## 2010-11-29 LAB — CBC
Hemoglobin: 14.7 g/dL (ref 13.0–17.0)
MCHC: 35.1 g/dL (ref 30.0–36.0)
Platelets: 156 10*3/uL (ref 150–400)
RDW: 13.2 % (ref 11.5–15.5)

## 2010-11-29 LAB — BASIC METABOLIC PANEL
Calcium: 9.7 mg/dL (ref 8.4–10.5)
Creatinine, Ser: 0.94 mg/dL (ref 0.4–1.5)
GFR calc Af Amer: >60 mL/min (ref >60)
GFR calc non Af Amer: >60 mL/min (ref >60)
Glucose, Bld: 104 mg/dL — ABNORMAL HIGH (ref 70–99)
Sodium: 140 mEq/L (ref 135–145)

## 2010-11-29 LAB — GLUCOSE, CAPILLARY: Glucose-Capillary: 79 mg/dL (ref 70–99)

## 2010-12-06 ENCOUNTER — Other Ambulatory Visit: Payer: Self-pay | Admitting: Family Medicine

## 2011-01-07 ENCOUNTER — Ambulatory Visit (INDEPENDENT_AMBULATORY_CARE_PROVIDER_SITE_OTHER): Payer: MEDICARE | Admitting: Family Medicine

## 2011-01-07 DIAGNOSIS — I4891 Unspecified atrial fibrillation: Secondary | ICD-10-CM

## 2011-01-07 LAB — POCT INR: INR: 3.5

## 2011-01-07 MED ORDER — WARFARIN SODIUM 5 MG PO TABS: 5.0000 mg | ORAL_TABLET | Freq: Every day | ORAL | Status: DC

## 2011-01-07 MED ORDER — WARFARIN SODIUM 1 MG PO TABS: 1.0000 mg | ORAL_TABLET | ORAL | Status: DC

## 2011-01-19 NOTE — Assessment & Plan Note (Signed)
[facility]                         GASTROENTEROLOGY OFFICE NOTE   Tanner Hensley, Tanner Hensley                      MRN:          [id number]  DATE:06/27/2007                            DOB:          26-May-1941    PRIMARY CARE PHYSICIAN:  Tera Mater. Clent Hensley, M.D.   CARDIOLOGIST:  Tanner Hensley in [place] at [facility].   REASON FOR REFERRAL:  Tanner Hensley asked me to evaluate Tanner Hensley in  consultation regarding colorectal cancer screening with colonoscopy.  He  is at high risk, given ongoing Coumadin use.   HISTORY OF PRESENT ILLNESS:  Tanner Hensley is a very pleasant 70 year old  man who had colonoscopy probably 10-15 years, at least.  He had several  polyps removed at that point.  He has been put on Coumadin in the  interim.  He has been on Coumadin for five years.  He said when he has  had any procedures done while on Coumadin that he bridges off with  Lovenox.  He has had no trouble with constipation or bleeding.  No  diarrhea.   REVIEW OF SYSTEMS:  Essentially normal and is available on her nursing  intake sheet.   PAST MEDICAL HISTORY:  1. Hypertension.  2. Atrial fibrillation, on Coumadin for the past five years.   CURRENT MEDICATIONS:  Coumadin, flecainide, prednisone 10 mg a day,  amlodipine, glucosamine, calcium, multivitamin.   ALLERGIES:  CODEINE, which causes itching and rash.   SOCIAL HISTORY:  Married with three children.  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  Nonsmoker, nondrinker.   FAMILY HISTORY:  No colon cancer or colon polyps in the family.   PHYSICAL EXAMINATION:  Height 5 feet 6 inches.  Weight 186 pounds.  Blood pressure 138/98, pulse 64.  CONSTITUTIONAL:  Generally well-appearing.  LUNGS:  Clear to auscultation bilaterally.  CARDIOVASCULAR:  Irregularly irregular.  ABDOMEN:  Soft, nontender, nondistended.  Normal bowel sounds.  EXTREMITIES:  No lower extremity edema.  SKIN:   No rash or lesions on visible extremities.   ASSESSMENT/PLAN:  A 70 year old man at slightly elevated risk for  colorectal cancer, given personal history of colon polyps, at elevated  risk for colonoscopy, given ongoing Coumadin use.   Usually in patient's with atrial fibrillation, it is safe to simply hold  their Coumadin; however, Tanner Hensley has usually bridged with Lovenox for  procedures, so we will contact his cardiologist, Tanner Hensley,  to confirm with them how he wants Tanner Hensley to be taken off Coumadin  safely for his colonoscopy.  We will arrange that to be done in the next  2-3 weeks pending the word from Tanner Hensley. I see no reason for any  further blood tests or imaging studies prior to them.     Tanner Fee, MD  Electronically Signed    DPJ/MedQ  DD: 06/27/2007  DT: 06/28/2007  Job #: [id number]   cc:   Tanner Senior A. Clent Ridges, MD  Tanner Hensley, [facility]

## 2011-01-22 NOTE — Discharge Summary (Signed)
NAME:  ARA, MANO NO.:  [id number]   MEDICAL RECORD NO.:  [id number]                   PATIENT TYPE:  INP   LOCATION:  3731                                 FACILITY:  [facility]   PHYSICIAN:  Doylene Canning. Ladona Ridgel, M.D.               DATE OF BIRTH:  05-31-1941   DATE OF ADMISSION:  12/09/2003  DATE OF DISCHARGE:  12/12/2003                                 DISCHARGE SUMMARY   PRIMARY DIAGNOSIS:  Atrial fibrillation.   SECONDARY DIAGNOSES:  1. 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  2. Hypertension.  3. Dyslipidemia.   HISTORY OF PRESENT ILLNESS:  This is a 70 year old gentleman who has a  history of atrial fibrillation diagnosed in August by Dr. Nelwyn Salisbury.  Initially, referred to see Dr. Emilie Rutter. Pulsipher, but otherwise healthy  except for atrial fibrillation.  Has hypertension, dyslipidemia and the  patient was initially treated with Flecainide with doses increased from 50  to 100 per day.  Subsequently, underwent a treadmill test where he developed  nonsustained exercise induced VT.  Flecainide was discontinued.  Since that  time, he has been predominantly in atrial fibrillation and not feeling well.  He continued to work a regular 40 hour or more work week.  He says he feels  that after he converted back in December he felt well for several weeks but  then went back to fibrillation and felt poorly since.  He is still able to  work but feels tired and has less energy.  Denies syncope or chest pain,  dyspnea on exertion, or overall fatigue.   HOSPITAL COURSE:  The patient was admitted and placed on Tikosyn therapy.  He tolerated Tikosyn well.  He converted normal sinus rhythm and was  eventually discharged to home in stable condition on all of his previous  medications.   DISCHARGE MEDICATIONS:  1. Tikosyn 500 mcg q.12h.  2. Effexor 75 mg q.d.  3. Norvasc 5 mg q.d.  4. Lotensin 20 mg q.d.  5. Toprol XL 25 mg q.d.  6. Lipitor 20 mg nightly.  7. Coumadin  7.5 mg nightly.  8. Multivitamin q.d.  9. Pain medication not applicable.   ACTIVITY:  No restrictions.   DIET:  Low fat, no salt, low cholesterol diet.   FOLLOW UP:  The patient was to follow-up with Dr. Doylene Canning. Ladona Ridgel in four to  six weeks and then be seen [facility] on Monday.      Chinita Pester, C.R.N.P. [facility]                 Doylene Canning. Ladona Ridgel, M.D.    DS/MEDQ  D:  12/12/2003  T:  12/12/2003  Job:  [id number]   cc:   Doylene Canning. Ladona Ridgel, M.D.   Tera Mater. Clent Ridges, M.D. [facility]

## 2011-01-22 NOTE — Op Note (Signed)
NAME:  Tanner Hensley, Tanner Hensley NO.:  [id number]   MEDICAL RECORD NO.:  [id number]          PATIENT TYPE:  OIB   LOCATION:  2856                         FACILITY:  [facility]   PHYSICIAN:  Willa Rough, M.D.     DATE OF BIRTH:  1941/03/02   DATE OF PROCEDURE:  DATE OF DISCHARGE:                                 OPERATIVE REPORT   PROCEDURE:  Cardioversion.   [name] has been carefully assessed and most recently was changed to  amiodarone.  He is now here for cardioversion.  He has normal LV function.   The patient received 110 mg of IV Pentothal with anesthesia present.  Anterior/posterior pads were in place.  He received 100 joules with a  biphasic defibrillator and converted to sinus rhythm.  He tolerated the  procedure well.   After an hour, he will be allowed to go home and he will follow up in our  office.      JK/MEDQ  D:  10/20/2004  T:  10/20/2004  Job:  [id number]   cc:   Doylene Canning. Ladona Ridgel, M.D.

## 2011-03-12 ENCOUNTER — Other Ambulatory Visit: Payer: Self-pay | Admitting: Family Medicine

## 2011-04-09 ENCOUNTER — Ambulatory Visit (INDEPENDENT_AMBULATORY_CARE_PROVIDER_SITE_OTHER): Payer: Medicare Other | Admitting: Family Medicine

## 2011-04-09 VITALS — BP 116/78 | HR 68

## 2011-04-09 DIAGNOSIS — I4891 Unspecified atrial fibrillation: Secondary | ICD-10-CM

## 2011-04-09 LAB — POCT INR: INR: 3

## 2011-04-16 ENCOUNTER — Ambulatory Visit (INDEPENDENT_AMBULATORY_CARE_PROVIDER_SITE_OTHER): Payer: Medicare Other | Admitting: Family Medicine

## 2011-04-16 VITALS — BP 150/92 | HR 102

## 2011-04-16 DIAGNOSIS — I4891 Unspecified atrial fibrillation: Secondary | ICD-10-CM

## 2011-04-16 LAB — POCT INR: INR: 3.1

## 2011-04-16 NOTE — Progress Notes (Signed)
  Subjective:    Patient ID: Tanner Hensley, male    DOB: 31-May-1941, 70 y.o.   MRN: [id number]  HPI  INR for A-fib  Review of Systems     Objective:   Physical Exam        Assessment & Plan:

## 2011-04-22 ENCOUNTER — Ambulatory Visit: Payer: Medicare Other

## 2011-04-22 ENCOUNTER — Telehealth: Payer: Self-pay | Admitting: Family Medicine

## 2011-04-22 MED ORDER — LISINOPRIL 10 MG PO TABS: 10.0000 mg | ORAL_TABLET | Freq: Every day | ORAL | Status: DC

## 2011-04-22 NOTE — Telephone Encounter (Signed)
Script sent e-scribe 

## 2011-04-23 ENCOUNTER — Ambulatory Visit (INDEPENDENT_AMBULATORY_CARE_PROVIDER_SITE_OTHER): Payer: Medicare Other | Admitting: Family Medicine

## 2011-04-23 VITALS — BP 137/81 | HR 67

## 2011-04-23 DIAGNOSIS — I4891 Unspecified atrial fibrillation: Secondary | ICD-10-CM

## 2011-04-23 LAB — POCT INR: INR: 3.3

## 2011-04-23 NOTE — Progress Notes (Signed)
  Subjective:    Patient ID: Tanner Hensley, male    DOB: September 11, 1940, 70 y.o.   MRN: [id number]  HPI INR needed for coumadin therapy   Review of Systems     Objective:   Physical Exam        Assessment & Plan:

## 2011-04-30 ENCOUNTER — Ambulatory Visit (INDEPENDENT_AMBULATORY_CARE_PROVIDER_SITE_OTHER): Payer: Medicare Other | Admitting: Family Medicine

## 2011-04-30 DIAGNOSIS — I4891 Unspecified atrial fibrillation: Secondary | ICD-10-CM

## 2011-04-30 DIAGNOSIS — Z7901 Long term (current) use of anticoagulants: Secondary | ICD-10-CM

## 2011-05-07 ENCOUNTER — Ambulatory Visit (INDEPENDENT_AMBULATORY_CARE_PROVIDER_SITE_OTHER): Payer: Medicare Other | Admitting: Family Medicine

## 2011-05-07 ENCOUNTER — Telehealth: Payer: Self-pay | Admitting: *Deleted

## 2011-05-07 ENCOUNTER — Ambulatory Visit: Payer: Medicare Other

## 2011-05-07 VITALS — BP 143/84 | HR 74

## 2011-05-07 DIAGNOSIS — I4891 Unspecified atrial fibrillation: Secondary | ICD-10-CM

## 2011-05-07 NOTE — Telephone Encounter (Signed)
error 

## 2011-05-07 NOTE — Progress Notes (Signed)
  Subjective:    Patient ID: Tanner Hensley, male    DOB: 03/12/1941, 69 y.o.   MRN: [id number]  HPI INR needed for coumadin therapy   Review of Systems     Objective:   Physical Exam        Assessment & Plan:   

## 2011-05-14 ENCOUNTER — Ambulatory Visit (INDEPENDENT_AMBULATORY_CARE_PROVIDER_SITE_OTHER): Payer: Medicare Other | Admitting: Family Medicine

## 2011-05-14 VITALS — BP 152/90 | HR 80

## 2011-05-14 DIAGNOSIS — Z5181 Encounter for therapeutic drug level monitoring: Secondary | ICD-10-CM

## 2011-05-14 DIAGNOSIS — I4891 Unspecified atrial fibrillation: Secondary | ICD-10-CM

## 2011-05-14 DIAGNOSIS — Z7901 Long term (current) use of anticoagulants: Secondary | ICD-10-CM

## 2011-05-14 NOTE — Progress Notes (Signed)
  Subjective:    Patient ID: Tanner Hensley, male    DOB: May 02, 1941, 70 y.o.   MRN: [id number]  HPI INR for coumadin therapy    Review of Systems     Objective:   Physical Exam        Assessment & Plan:

## 2011-05-17 ENCOUNTER — Other Ambulatory Visit: Payer: Self-pay | Admitting: *Deleted

## 2011-05-17 MED ORDER — WARFARIN SODIUM 5 MG PO TABS: 5.0000 mg | ORAL_TABLET | Freq: Every day | ORAL | Status: DC

## 2011-05-19 ENCOUNTER — Ambulatory Visit (INDEPENDENT_AMBULATORY_CARE_PROVIDER_SITE_OTHER): Payer: Medicare Other | Admitting: Family Medicine

## 2011-05-19 VITALS — BP 148/93 | HR 76 | Wt 169.0 lb

## 2011-05-19 DIAGNOSIS — I4891 Unspecified atrial fibrillation: Secondary | ICD-10-CM

## 2011-05-19 NOTE — Progress Notes (Signed)
  Subjective:    Patient ID: Tanner Hensley, male    DOB: 01/09/1941, 69 y.o.   MRN: [id number]  HPI INR needed for coumadin therapy   Review of Systems     Objective:   Physical Exam        Assessment & Plan:   

## 2011-05-31 ENCOUNTER — Ambulatory Visit: Payer: Medicare Other | Admitting: Family Medicine

## 2011-06-01 ENCOUNTER — Ambulatory Visit (INDEPENDENT_AMBULATORY_CARE_PROVIDER_SITE_OTHER): Payer: Medicare Other | Admitting: Family Medicine

## 2011-06-01 VITALS — BP 148/74 | HR 56

## 2011-06-01 DIAGNOSIS — I4891 Unspecified atrial fibrillation: Secondary | ICD-10-CM

## 2011-06-01 NOTE — Progress Notes (Signed)
  Subjective:    Patient ID: Tanner Hensley, male    DOB: 12/08/1940, 69 y.o.   MRN: [id number]  HPI INR needed for coumadin therapy   Review of Systems     Objective:   Physical Exam        Assessment & Plan:   

## 2011-06-02 NOTE — Progress Notes (Signed)
  Subjective:    Patient ID: Tanner Hensley, male    DOB: 1941/06/29, 70 y.o.   MRN: [id number]  HPI Pt aware    Review of Systems     Objective:   Physical Exam        Assessment & Plan:

## 2011-06-04 ENCOUNTER — Encounter: Payer: Self-pay | Admitting: Family Medicine

## 2011-06-07 ENCOUNTER — Ambulatory Visit (INDEPENDENT_AMBULATORY_CARE_PROVIDER_SITE_OTHER): Payer: Medicare Other | Admitting: Family Medicine

## 2011-06-07 ENCOUNTER — Encounter: Payer: Self-pay | Admitting: Family Medicine

## 2011-06-07 VITALS — BP 120/74 | HR 51 | Temp 98.2°F | Wt 166.0 lb

## 2011-06-07 DIAGNOSIS — Z23 Encounter for immunization: Secondary | ICD-10-CM

## 2011-06-07 DIAGNOSIS — I4891 Unspecified atrial fibrillation: Secondary | ICD-10-CM

## 2011-06-07 DIAGNOSIS — I480 Paroxysmal atrial fibrillation: Secondary | ICD-10-CM

## 2011-06-07 DIAGNOSIS — I10 Essential (primary) hypertension: Secondary | ICD-10-CM

## 2011-06-07 DIAGNOSIS — I2510 Atherosclerotic heart disease of native coronary artery without angina pectoris: Secondary | ICD-10-CM

## 2011-06-07 NOTE — Progress Notes (Signed)
Addended by: Aniceto Boss A on: 06/07/2011 12:52 PM   Modules accepted: Orders

## 2011-06-07 NOTE — Progress Notes (Signed)
  Subjective:    Patient ID: Tanner Hensley, male    DOB: 16-May-1941, 70 y.o.   MRN: [id number]  HPI Here to follow up a hospital stay at [facility] from 05-20-11 to 05-22-11 for a DC cardioversion per Dr. Isabell Jarvis. He had another bout of atrial fib, and he has done well since he got home. His rhythm has remained in sinus. He denies any SOB or chest pain. He stays tired all the time however, and he takes a lot of naps now. He cannot use Fleconide now since he has coronary stents in place, so he is taking Sotalol instead.    Review of Systems  Constitutional: Positive for fatigue.  Respiratory: Negative.   Cardiovascular: Negative.        Objective:   Physical Exam  Constitutional: He appears well-developed and well-nourished.  Neck: Neck supple. No thyromegaly present.  Cardiovascular: Normal rate, regular rhythm, normal heart sounds and intact distal pulses.  Exam reveals no gallop and no friction rub.   No murmur heard. Pulmonary/Chest: Effort normal and breath sounds normal. No respiratory distress. He has no wheezes. He has no rales. He exhibits no tenderness.  Lymphadenopathy:    He has no cervical adenopathy.          Assessment & Plan:  He seems to be doing well. I think his energy level  will continue to improve over time. He is to see Dr. Isabell Jarvis in a few weeks.

## 2011-06-20 ENCOUNTER — Other Ambulatory Visit: Payer: Self-pay | Admitting: Family Medicine

## 2011-07-01 ENCOUNTER — Ambulatory Visit (INDEPENDENT_AMBULATORY_CARE_PROVIDER_SITE_OTHER): Payer: Medicare Other | Admitting: Family Medicine

## 2011-07-01 VITALS — BP 145/75 | HR 46

## 2011-07-01 DIAGNOSIS — I4891 Unspecified atrial fibrillation: Secondary | ICD-10-CM

## 2011-07-01 LAB — POCT INR: INR: 3.1

## 2011-07-01 NOTE — Progress Notes (Signed)
  Subjective:    Patient ID: Tanner Hensley, male    DOB: 1940-11-07, 70 y.o.   MRN: [id number] Pt here for PT/INR check. Pt pulse low at 46- states seen cardiologist yesterday and is scheduled to have pacemaker put in in December. Instructed pt to monitor and if felt dizzy or more tired than usual then to call cardiologist and notify him. HPI    Review of Systems     Objective:   Physical Exam        Assessment & Plan:

## 2011-07-12 ENCOUNTER — Other Ambulatory Visit: Payer: Self-pay | Admitting: Family Medicine

## 2011-07-19 ENCOUNTER — Ambulatory Visit (INDEPENDENT_AMBULATORY_CARE_PROVIDER_SITE_OTHER): Payer: Medicare Other | Admitting: Family Medicine

## 2011-07-19 ENCOUNTER — Encounter: Payer: Self-pay | Admitting: Family Medicine

## 2011-07-19 VITALS — BP 146/78 | HR 45 | Temp 98.5°F | Ht 65.5 in | Wt 168.0 lb

## 2011-07-19 DIAGNOSIS — Z0000 Encounter for general adult medical examination without abnormal findings: Secondary | ICD-10-CM

## 2011-07-19 DIAGNOSIS — Z79899 Other long term (current) drug therapy: Secondary | ICD-10-CM

## 2011-07-19 DIAGNOSIS — Z125 Encounter for screening for malignant neoplasm of prostate: Secondary | ICD-10-CM

## 2011-07-19 LAB — POCT URINALYSIS DIPSTICK
Nitrite, UA: NEGATIVE
Protein, UA: NEGATIVE
Spec Grav, UA: 1.025
Urobilinogen, UA: 0.2

## 2011-07-19 MED ORDER — PREDNISONE 5 MG PO TABS: 5.0000 mg | ORAL_TABLET | Freq: Every day | ORAL | Status: DC

## 2011-07-19 NOTE — Progress Notes (Signed)
  Subjective:    Patient ID: Tanner Hensley, male    DOB: 25-Sep-1940, 70 y.o.   MRN: [id number]  HPI 70 yr old male for a cpx. He is doing well in general. He is scheduled to have a pacemaker implanted next month. He was recently started on Fosamax by Dr. Kellie Simmering, although he has never had a bone density evaluation.   Review of Systems  Constitutional: Negative.   HENT: Negative.   Eyes: Negative.   Respiratory: Negative.   Cardiovascular: Negative.   Gastrointestinal: Negative.   Genitourinary: Negative.   Musculoskeletal: Negative.   Skin: Negative.   Neurological: Negative.   Hematological: Negative.   Psychiatric/Behavioral: Negative.        Objective:   Physical Exam  Constitutional: He is oriented to person, place, and time. He appears well-developed and well-nourished. No distress.  HENT:  Head: Normocephalic and atraumatic.  Right Ear: External ear normal.  Left Ear: External ear normal.  Nose: Nose normal.  Mouth/Throat: Oropharynx is clear and moist. No oropharyngeal exudate.  Eyes: Conjunctivae and EOM are normal. Pupils are equal, round, and reactive to light. Right eye exhibits no discharge. Left eye exhibits no discharge. No scleral icterus.  Neck: Neck supple. No JVD present. No tracheal deviation present. No thyromegaly present.  Cardiovascular: Normal rate, regular rhythm, normal heart sounds and intact distal pulses.  Exam reveals no gallop and no friction rub.   No murmur heard. Pulmonary/Chest: Effort normal and breath sounds normal. No respiratory distress. He has no wheezes. He has no rales. He exhibits no tenderness.  Abdominal: Soft. Bowel sounds are normal. He exhibits no distension and no mass. There is no tenderness. There is no rebound and no guarding.  Genitourinary: Rectum normal, prostate normal and penis normal. Guaiac negative stool. No penile tenderness.  Musculoskeletal: Normal range of motion. He exhibits no edema and no tenderness.    Lymphadenopathy:    He has no cervical adenopathy.  Neurological: He is alert and oriented to person, place, and time. He has normal reflexes. No cranial nerve deficit. He exhibits normal muscle tone. Coordination normal.  Skin: Skin is warm and dry. No rash noted. He is not diaphoretic. No erythema. No pallor.  Psychiatric: He has a normal mood and affect. His behavior is normal. Judgment and thought content normal.          Assessment & Plan:  Get fasting labs. Set up a DEXA.

## 2011-07-20 LAB — LIPID PANEL
Cholesterol: 212 mg/dL — ABNORMAL HIGH (ref 0–200)
HDL: 50.3 mg/dL (ref >39.00)
Triglycerides: 76 mg/dL (ref 0.0–149.0)

## 2011-07-20 LAB — TSH: TSH: 0.73 u[IU]/mL (ref 0.35–5.50)

## 2011-07-20 LAB — CBC WITH DIFFERENTIAL/PLATELET
Basophils Relative: 0.2 % (ref 0.0–3.0)
Eosinophils Relative: 1.1 % (ref 0.0–5.0)
HCT: 42.5 % (ref 39.0–52.0)
Hemoglobin: 14.4 g/dL (ref 13.0–17.0)
Lymphs Abs: 0.8 10*3/uL (ref 0.7–4.0)
MCV: 103.3 fl — ABNORMAL HIGH (ref 78.0–100.0)
Monocytes Absolute: 0.6 10*3/uL (ref 0.1–1.0)
Monocytes Relative: 7.7 % (ref 3.0–12.0)
Platelets: 146 10*3/uL — ABNORMAL LOW (ref 150.0–400.0)
RBC: 4.11 Mil/uL — ABNORMAL LOW (ref 4.22–5.81)
WBC: 7.3 10*3/uL (ref 4.5–10.5)

## 2011-07-20 LAB — HEPATIC FUNCTION PANEL
ALT: 20 U/L (ref 0–53)
Albumin: 4 g/dL (ref 3.5–5.2)
Bilirubin, Direct: 0.1 mg/dL (ref 0.0–0.3)
Total Protein: 6.3 g/dL (ref 6.0–8.3)

## 2011-07-20 LAB — BASIC METABOLIC PANEL
BUN: 17 mg/dL (ref 6–23)
CO2: 29 mEq/L (ref 19–32)
Calcium: 9.5 mg/dL (ref 8.4–10.5)
Creatinine, Ser: 0.7 mg/dL (ref 0.4–1.5)
Glucose, Bld: 88 mg/dL (ref 70–99)
Sodium: 143 mEq/L (ref 135–145)

## 2011-07-20 LAB — LDL CHOLESTEROL, DIRECT: Direct LDL: 151.8 mg/dL

## 2011-07-21 ENCOUNTER — Encounter: Payer: Self-pay | Admitting: Family Medicine

## 2011-07-21 ENCOUNTER — Ambulatory Visit (INDEPENDENT_AMBULATORY_CARE_PROVIDER_SITE_OTHER)
Admission: RE | Admit: 2011-07-21 | Discharge: 2011-07-21 | Disposition: A | Discharge status: Home / Self Care | Payer: Medicare Other | Source: Ambulatory Visit

## 2011-07-21 DIAGNOSIS — Z0000 Encounter for general adult medical examination without abnormal findings: Secondary | ICD-10-CM

## 2011-07-21 DIAGNOSIS — Z13820 Encounter for screening for osteoporosis: Secondary | ICD-10-CM

## 2011-07-21 NOTE — Progress Notes (Signed)
Quick Note:  Pt aware and put a copy of labs in mail, per pt request. ______

## 2011-08-09 ENCOUNTER — Ambulatory Visit (INDEPENDENT_AMBULATORY_CARE_PROVIDER_SITE_OTHER): Payer: Medicare Other | Admitting: Family Medicine

## 2011-08-09 ENCOUNTER — Encounter: Payer: Self-pay | Admitting: Family Medicine

## 2011-08-09 DIAGNOSIS — M109 Gout, unspecified: Secondary | ICD-10-CM

## 2011-08-09 MED ORDER — METHYLPREDNISOLONE ACETATE 80 MG/ML IJ SUSP
120.0000 mg | Freq: Once | INTRAMUSCULAR | Status: AC
  Administered 2011-08-09: 120 mg via INTRAMUSCULAR

## 2011-08-09 MED ORDER — OXYCODONE-ACETAMINOPHEN 5-325 MG PO TABS: 1.0000 | ORAL_TABLET | ORAL | Status: DC | PRN

## 2011-08-09 NOTE — Progress Notes (Signed)
  Subjective:    Patient ID: Tanner Hensley, male    DOB: [birth year], 70 y.o.   MRN: [id number]  HPI Here for the sudden onset yesterday of swelling and pain in the right ankle. No recent trauma but he did a lot of yardwork this past weekend. On Tylenol.    Review of Systems  Constitutional: Negative.   Musculoskeletal: Positive for joint swelling and arthralgias.       Objective:   Physical Exam  Constitutional: He appears well-developed and well-nourished.  Musculoskeletal:       Right lateral malleolus is red, warm, swollen, and very tender           Assessment & Plan:  Recheck prn

## 2011-08-10 ENCOUNTER — Telehealth: Payer: Self-pay

## 2011-08-10 NOTE — Telephone Encounter (Signed)
Pt was in to see Dr. Clent Ridges about gout on yesterday and once pt got home he remembered he had just started taking fish oil tablets.  Could this be causing a problem with gout? Pls advise.

## 2011-08-11 NOTE — Telephone Encounter (Signed)
Pts wife is aware 

## 2011-08-11 NOTE — Telephone Encounter (Signed)
No, fish oil will not aggrevate gout at all

## 2011-08-11 NOTE — Telephone Encounter (Signed)
Called pt at home no vm will, attempt to call at a later time.

## 2011-08-12 ENCOUNTER — Telehealth: Payer: Self-pay | Admitting: Family Medicine

## 2011-08-12 NOTE — Telephone Encounter (Signed)
I called to give Bone Density Scan and pt is already taking the suggested supplements and script. Is there something else to recommend?

## 2011-08-12 NOTE — Progress Notes (Signed)
Quick Note:  I spoke with pt and he has been taking the supplements and the Fosamax. Is there anything else to recommend? ______

## 2011-08-12 NOTE — Telephone Encounter (Signed)
Spoke with pt and he requested a copy in the mail, which I did.

## 2011-08-12 NOTE — Telephone Encounter (Signed)
Good. No there is nothing else to do. Repeat a DEXA in 2 years

## 2011-08-25 ENCOUNTER — Other Ambulatory Visit: Payer: Self-pay | Admitting: Family Medicine

## 2011-09-10 ENCOUNTER — Ambulatory Visit (INDEPENDENT_AMBULATORY_CARE_PROVIDER_SITE_OTHER): Payer: Medicare Other | Admitting: Family Medicine

## 2011-09-10 VITALS — BP 124/72 | HR 56

## 2011-09-10 DIAGNOSIS — I4891 Unspecified atrial fibrillation: Secondary | ICD-10-CM

## 2011-09-10 NOTE — Progress Notes (Signed)
Patient ID: Tanner Hensley, male   DOB: 07-Aug-1941, 71 y.o.   MRN: [id number] INR for coumadin therapy

## 2011-09-16 ENCOUNTER — Ambulatory Visit (INDEPENDENT_AMBULATORY_CARE_PROVIDER_SITE_OTHER): Payer: Medicare Other | Admitting: Family Medicine

## 2011-09-16 DIAGNOSIS — I4891 Unspecified atrial fibrillation: Secondary | ICD-10-CM

## 2011-09-16 LAB — POCT INR: INR: 1.8

## 2011-09-16 NOTE — Progress Notes (Signed)
  Subjective:    Patient ID: Tanner Hensley, male    DOB: 11/01/1940, 70 y.o.   MRN: [id number] INR check HPI    Review of Systems     Objective:   Physical Exam        Assessment & Plan:   

## 2011-09-23 ENCOUNTER — Other Ambulatory Visit: Payer: Self-pay | Admitting: Family Medicine

## 2011-10-07 ENCOUNTER — Ambulatory Visit (INDEPENDENT_AMBULATORY_CARE_PROVIDER_SITE_OTHER): Payer: Medicare Other | Admitting: Family Medicine

## 2011-10-07 VITALS — BP 148/81 | HR 62

## 2011-10-07 DIAGNOSIS — I4891 Unspecified atrial fibrillation: Secondary | ICD-10-CM

## 2011-10-07 LAB — POCT INR: INR: 1.9

## 2011-10-07 NOTE — Progress Notes (Signed)
  Subjective:    Patient ID: Tanner Hensley, male    DOB: [birth year], 71 y.o.   MRN: [id number] INR check. Pt just came from cardiologist office and had new pacemaker adjusted HPI    Review of Systems     Objective:   Physical Exam        Assessment & Plan:

## 2011-10-31 ENCOUNTER — Other Ambulatory Visit: Payer: Self-pay | Admitting: Family Medicine

## 2011-11-29 ENCOUNTER — Other Ambulatory Visit: Payer: Self-pay | Admitting: Family Medicine

## 2011-11-30 ENCOUNTER — Telehealth: Payer: Self-pay | Admitting: Family Medicine

## 2011-11-30 ENCOUNTER — Ambulatory Visit (INDEPENDENT_AMBULATORY_CARE_PROVIDER_SITE_OTHER): Payer: Medicare Other

## 2011-11-30 ENCOUNTER — Telehealth: Payer: Self-pay | Admitting: *Deleted

## 2011-11-30 DIAGNOSIS — I4891 Unspecified atrial fibrillation: Secondary | ICD-10-CM

## 2011-11-30 MED ORDER — LISINOPRIL 10 MG PO TABS: 10.0000 mg | ORAL_TABLET | Freq: Every day | ORAL | Status: DC

## 2011-11-30 NOTE — Telephone Encounter (Signed)
Pt requested a 90 day supply of Lisinopril and send to [facility]. Script sent e-scribe.

## 2011-12-02 ENCOUNTER — Ambulatory Visit (INDEPENDENT_AMBULATORY_CARE_PROVIDER_SITE_OTHER): Payer: Medicare Other | Admitting: Family Medicine

## 2011-12-02 VITALS — BP 157/81 | HR 60

## 2011-12-02 DIAGNOSIS — I4891 Unspecified atrial fibrillation: Secondary | ICD-10-CM

## 2011-12-02 LAB — POCT INR: INR: 2.1

## 2011-12-02 NOTE — Progress Notes (Signed)
  Subjective:    Patient ID: Tanner Hensley, male    DOB: 08/22/1941, 70 y.o.   MRN: [id number] INR check HPI    Review of Systems     Objective:   Physical Exam        Assessment & Plan:   

## 2011-12-14 ENCOUNTER — Ambulatory Visit (INDEPENDENT_AMBULATORY_CARE_PROVIDER_SITE_OTHER): Payer: Medicare Other | Admitting: Family Medicine

## 2011-12-14 ENCOUNTER — Encounter: Payer: Self-pay | Admitting: Family Medicine

## 2011-12-14 VITALS — BP 124/70 | HR 61 | Temp 98.6°F | Wt 172.0 lb

## 2011-12-14 DIAGNOSIS — J40 Bronchitis, not specified as acute or chronic: Secondary | ICD-10-CM

## 2011-12-14 MED ORDER — AZITHROMYCIN 250 MG PO TABS: ORAL_TABLET | ORAL | Status: AC

## 2011-12-14 NOTE — Progress Notes (Signed)
  Subjective:    Patient ID: Tanner Hensley, male    DOB: 1941-08-19, 71 y.o.   MRN: [id number]  HPI Here for 7 days of PND, chest tightness and coughing up green sputum. No chest pains. Some low fevers.    Review of Systems  Constitutional: Positive for fever.  HENT: Positive for congestion and postnasal drip.   Eyes: Negative.   Respiratory: Positive for cough and chest tightness.   Cardiovascular: Negative.        Objective:   Physical Exam  Constitutional: He appears well-developed and well-nourished.  HENT:  Right Ear: External ear normal.  Left Ear: External ear normal.  Nose: Nose normal.  Mouth/Throat: Oropharynx is clear and moist. No oropharyngeal exudate.  Eyes: Conjunctivae are normal.  Pulmonary/Chest: Effort normal. No respiratory distress. He has no wheezes. He has no rales.       Scattered rhonchi  Lymphadenopathy:    He has no cervical adenopathy.          Assessment & Plan:  Add Robitussin DM.

## 2012-02-09 ENCOUNTER — Other Ambulatory Visit: Payer: Self-pay | Admitting: Family Medicine

## 2012-03-24 ENCOUNTER — Ambulatory Visit (INDEPENDENT_AMBULATORY_CARE_PROVIDER_SITE_OTHER): Payer: Medicare Other | Admitting: Family Medicine

## 2012-03-24 ENCOUNTER — Other Ambulatory Visit: Payer: Self-pay | Admitting: *Deleted

## 2012-03-24 VITALS — BP 141/73 | HR 61

## 2012-03-24 DIAGNOSIS — I4891 Unspecified atrial fibrillation: Secondary | ICD-10-CM

## 2012-03-24 MED ORDER — WARFARIN SODIUM 5 MG PO TABS: 5.0000 mg | ORAL_TABLET | Freq: Every day | ORAL | Status: DC

## 2012-03-24 NOTE — Progress Notes (Signed)
  Subjective:    Patient ID: Tanner Hensley, male    DOB: Jul 11, 1941, 71 y.o.   MRN: [id number] INR check HPI    Review of Systems     Objective:   Physical Exam        Assessment & Plan:

## 2012-03-31 ENCOUNTER — Ambulatory Visit (INDEPENDENT_AMBULATORY_CARE_PROVIDER_SITE_OTHER): Payer: Medicare Other | Admitting: Family Medicine

## 2012-03-31 VITALS — BP 183/98 | HR 70

## 2012-03-31 DIAGNOSIS — I4891 Unspecified atrial fibrillation: Secondary | ICD-10-CM

## 2012-03-31 NOTE — Addendum Note (Signed)
Addended by: Nani Gasser D on: 03/31/2012 09:34 AM   Modules accepted: Level of Service

## 2012-03-31 NOTE — Progress Notes (Signed)
  Subjective:    Patient ID: Tanner Hensley, male    DOB: [birth year], 71 y.o.   MRN: [id number] INR check. Pt has taken BP med this am. Has no c/o dizziness, blurred vision, H/A HPI    Review of Systems     Objective:   Physical Exam        Assessment & Plan:

## 2012-04-08 ENCOUNTER — Other Ambulatory Visit: Payer: Self-pay | Admitting: Family Medicine

## 2012-04-10 ENCOUNTER — Ambulatory Visit (INDEPENDENT_AMBULATORY_CARE_PROVIDER_SITE_OTHER): Payer: Medicare Other | Admitting: Family Medicine

## 2012-04-10 VITALS — BP 162/90 | HR 59

## 2012-04-10 DIAGNOSIS — I4891 Unspecified atrial fibrillation: Secondary | ICD-10-CM

## 2012-04-10 NOTE — Progress Notes (Signed)
  Subjective:    Patient ID: Tanner Hensley, male    DOB: Sep 26, 1940, 71 y.o.   MRN: [id number]  HPI  INR check. Patient denies any excessive bruising or bleeding. 5 minutes spent with patient.   Review of Systems     Objective:   Physical Exam        Assessment & Plan:

## 2012-04-24 ENCOUNTER — Ambulatory Visit (INDEPENDENT_AMBULATORY_CARE_PROVIDER_SITE_OTHER): Payer: Medicare Other | Admitting: Family Medicine

## 2012-04-24 VITALS — BP 111/72

## 2012-04-24 DIAGNOSIS — I4891 Unspecified atrial fibrillation: Secondary | ICD-10-CM

## 2012-04-24 NOTE — Progress Notes (Signed)
Pt denies any excessive bruising or bleeding. 5 mins spent with pt.

## 2012-05-15 ENCOUNTER — Ambulatory Visit (INDEPENDENT_AMBULATORY_CARE_PROVIDER_SITE_OTHER): Payer: Medicare Other

## 2012-05-16 ENCOUNTER — Ambulatory Visit (INDEPENDENT_AMBULATORY_CARE_PROVIDER_SITE_OTHER): Payer: Medicare Other | Admitting: Family Medicine

## 2012-05-16 VITALS — BP 138/85 | HR 67

## 2012-05-16 DIAGNOSIS — I4891 Unspecified atrial fibrillation: Secondary | ICD-10-CM

## 2012-05-16 NOTE — Progress Notes (Signed)
  Subjective:    Patient ID: Tanner Hensley, male    DOB: April 10, 1941, 71 y.o.   MRN: [id number]  HPI   Pt denies any excessive bruising or bleeding. 5 mins spent with pt. Takes 7.5mg  two days out of the week and 5mg  all other days Review of Systems     Objective:   Physical Exam        Assessment & Plan:

## 2012-05-24 ENCOUNTER — Encounter: Payer: Self-pay | Admitting: Gastroenterology

## 2012-06-21 ENCOUNTER — Ambulatory Visit: Payer: Medicare Other

## 2012-06-21 ENCOUNTER — Ambulatory Visit (INDEPENDENT_AMBULATORY_CARE_PROVIDER_SITE_OTHER): Payer: Medicare Other

## 2012-06-21 DIAGNOSIS — Z23 Encounter for immunization: Secondary | ICD-10-CM

## 2012-07-10 ENCOUNTER — Telehealth: Payer: Self-pay | Admitting: *Deleted

## 2012-07-10 ENCOUNTER — Ambulatory Visit (INDEPENDENT_AMBULATORY_CARE_PROVIDER_SITE_OTHER): Payer: Medicare Other | Admitting: Family Medicine

## 2012-07-10 VITALS — BP 123/69 | HR 98

## 2012-07-10 DIAGNOSIS — I4891 Unspecified atrial fibrillation: Secondary | ICD-10-CM

## 2012-07-10 NOTE — Telephone Encounter (Signed)
Called and notified pt of results. 

## 2012-07-17 ENCOUNTER — Ambulatory Visit: Payer: Medicare Other | Admitting: Family Medicine

## 2012-07-20 ENCOUNTER — Ambulatory Visit (INDEPENDENT_AMBULATORY_CARE_PROVIDER_SITE_OTHER): Payer: Medicare Other | Admitting: Family Medicine

## 2012-07-20 ENCOUNTER — Encounter: Payer: Self-pay | Admitting: Family Medicine

## 2012-07-20 VITALS — BP 122/78 | HR 65 | Temp 97.8°F | Ht 65.25 in | Wt 157.0 lb

## 2012-07-20 DIAGNOSIS — Z0000 Encounter for general adult medical examination without abnormal findings: Secondary | ICD-10-CM

## 2012-07-20 DIAGNOSIS — E119 Type 2 diabetes mellitus without complications: Secondary | ICD-10-CM

## 2012-07-20 DIAGNOSIS — Z23 Encounter for immunization: Secondary | ICD-10-CM

## 2012-07-20 DIAGNOSIS — Z125 Encounter for screening for malignant neoplasm of prostate: Secondary | ICD-10-CM

## 2012-07-20 LAB — CBC WITH DIFFERENTIAL/PLATELET
Basophils Absolute: 0 10*3/uL (ref 0.0–0.1)
Basophils Relative: 0.5 % (ref 0.0–3.0)
Hemoglobin: 13.7 g/dL (ref 13.0–17.0)
Lymphocytes Relative: 23.8 % (ref 12.0–46.0)
Monocytes Relative: 10.9 % (ref 3.0–12.0)
Neutro Abs: 3.5 10*3/uL (ref 1.4–7.7)
Neutrophils Relative %: 61.9 % (ref 43.0–77.0)
RBC: 3.98 Mil/uL — ABNORMAL LOW (ref 4.22–5.81)
WBC: 5.7 10*3/uL (ref 4.5–10.5)

## 2012-07-20 LAB — POCT URINALYSIS DIPSTICK
Bilirubin, UA: NEGATIVE
Glucose, UA: NEGATIVE
Ketones, UA: NEGATIVE
Leukocytes, UA: NEGATIVE

## 2012-07-20 LAB — PSA: PSA: 1.01 ng/mL (ref 0.10–4.00)

## 2012-07-20 LAB — BASIC METABOLIC PANEL
Calcium: 9.2 mg/dL (ref 8.4–10.5)
Chloride: 107 mEq/L (ref 96–112)
Creatinine, Ser: 0.8 mg/dL (ref 0.4–1.5)
GFR: 102.5 mL/min (ref >60.00)

## 2012-07-20 LAB — HEPATIC FUNCTION PANEL
AST: 28 U/L (ref 0–37)
Total Bilirubin: 1.2 mg/dL (ref 0.3–1.2)

## 2012-07-20 LAB — LIPID PANEL
Cholesterol: 262 mg/dL — ABNORMAL HIGH (ref 0–200)
Total CHOL/HDL Ratio: 5
VLDL: 34 mg/dL (ref 0.0–40.0)

## 2012-07-20 LAB — LDL CHOLESTEROL, DIRECT: Direct LDL: 176.1 mg/dL

## 2012-07-20 LAB — TSH: TSH: 0.92 u[IU]/mL (ref 0.35–5.50)

## 2012-07-20 MED ORDER — ALENDRONATE SODIUM 70 MG PO TABS: 70.0000 mg | ORAL_TABLET | ORAL | Status: DC

## 2012-07-20 MED ORDER — LISINOPRIL 10 MG PO TABS: 10.0000 mg | ORAL_TABLET | Freq: Every day | ORAL | Status: DC

## 2012-07-20 NOTE — Progress Notes (Signed)
  Subjective:    Patient ID: Tanner Hensley, male    DOB: 02-26-1941, 72 y.o.   MRN: [id number]  HPI 71 yr old male for a cpx. He feels well and has no concerns. He has been dieting and exercising for 2 months and has lost 11 lbs.    Review of Systems  Constitutional: Negative.   HENT: Negative.   Eyes: Negative.   Respiratory: Negative.   Cardiovascular: Negative.   Gastrointestinal: Negative.   Genitourinary: Negative.   Musculoskeletal: Negative.   Skin: Negative.   Neurological: Negative.   Hematological: Negative.   Psychiatric/Behavioral: Negative.        Objective:   Physical Exam  Constitutional: He is oriented to person, place, and time. He appears well-developed and well-nourished. No distress.  HENT:  Head: Normocephalic and atraumatic.  Right Ear: External ear normal.  Left Ear: External ear normal.  Nose: Nose normal.  Mouth/Throat: Oropharynx is clear and moist. No oropharyngeal exudate.  Eyes: Conjunctivae normal and EOM are normal. Pupils are equal, round, and reactive to light. Right eye exhibits no discharge. Left eye exhibits no discharge. No scleral icterus.  Neck: Neck supple. No JVD present. No tracheal deviation present. No thyromegaly present.  Cardiovascular: Normal rate, regular rhythm, normal heart sounds and intact distal pulses.  Exam reveals no gallop and no friction rub.   No murmur heard. Pulmonary/Chest: Effort normal and breath sounds normal. No respiratory distress. He has no wheezes. He has no rales. He exhibits no tenderness.  Abdominal: Soft. Bowel sounds are normal. He exhibits no distension and no mass. There is no tenderness. There is no rebound and no guarding.  Genitourinary: Rectum normal, prostate normal and penis normal. Guaiac negative stool. No penile tenderness.  Musculoskeletal: Normal range of motion. He exhibits no edema and no tenderness.  Lymphadenopathy:    He has no cervical adenopathy.  Neurological: He is alert and  oriented to person, place, and time. He has normal reflexes. No cranial nerve deficit. He exhibits normal muscle tone. Coordination normal.  Skin: Skin is warm and dry. No rash noted. He is not diaphoretic. No erythema. No pallor.  Psychiatric: He has a normal mood and affect. His behavior is normal. Judgment and thought content normal.          Assessment & Plan:  Well exam. Get fasting labs

## 2012-07-24 ENCOUNTER — Encounter: Payer: Self-pay | Admitting: Family Medicine

## 2012-07-24 MED ORDER — ATORVASTATIN CALCIUM 20 MG PO TABS: 20.0000 mg | ORAL_TABLET | Freq: Every day | ORAL | Status: DC

## 2012-07-24 NOTE — Addendum Note (Signed)
Addended by: Aniceto Boss A on: 07/24/2012 03:59 PM   Modules accepted: Orders

## 2012-07-24 NOTE — Progress Notes (Signed)
Quick Note:  I spoke to pt, sent script e-scribe and put a copy of results in mail. ______

## 2012-09-26 ENCOUNTER — Ambulatory Visit (INDEPENDENT_AMBULATORY_CARE_PROVIDER_SITE_OTHER): Payer: Medicare Other | Admitting: Family Medicine

## 2012-09-26 ENCOUNTER — Telehealth: Payer: Self-pay | Admitting: *Deleted

## 2012-09-26 VITALS — BP 114/85 | HR 63

## 2012-09-26 DIAGNOSIS — I4891 Unspecified atrial fibrillation: Secondary | ICD-10-CM

## 2012-09-26 LAB — POCT INR: INR: 4.3

## 2012-09-26 LAB — PROTIME-INR: INR: 3.45 — ABNORMAL HIGH (ref <1.50)

## 2012-09-26 NOTE — Telephone Encounter (Signed)
Pt.notified

## 2012-10-02 ENCOUNTER — Telehealth: Payer: Self-pay | Admitting: Family Medicine

## 2012-10-02 NOTE — Telephone Encounter (Signed)
Stop it for now and we will discuss

## 2012-10-02 NOTE — Telephone Encounter (Signed)
Pt feels like he is having side effects from the atorvastatin (LIPITOR) 20 MG tablet Pain in shoulders,both wrist, sometimes in leg (Not a lot). This pain has been going on since mid December. Pt has follow up appt tommorow. Pt wanted me to let you know prior to appt. Should he take in the am before his appt? Pls advise.

## 2012-10-02 NOTE — Telephone Encounter (Signed)
I spoke with pt  

## 2012-10-03 ENCOUNTER — Encounter: Payer: Self-pay | Admitting: Family Medicine

## 2012-10-03 ENCOUNTER — Ambulatory Visit (INDEPENDENT_AMBULATORY_CARE_PROVIDER_SITE_OTHER): Payer: Medicare Other | Admitting: Family Medicine

## 2012-10-03 VITALS — BP 128/86 | HR 67 | Temp 97.9°F | Wt 166.0 lb

## 2012-10-03 DIAGNOSIS — M791 Myalgia, unspecified site: Secondary | ICD-10-CM

## 2012-10-03 DIAGNOSIS — I10 Essential (primary) hypertension: Secondary | ICD-10-CM

## 2012-10-03 DIAGNOSIS — IMO0001 Reserved for inherently not codable concepts without codable children: Secondary | ICD-10-CM

## 2012-10-03 DIAGNOSIS — E785 Hyperlipidemia, unspecified: Secondary | ICD-10-CM

## 2012-10-03 NOTE — Progress Notes (Signed)
  Subjective:    Patient ID: Tanner Hensley, male    DOB: 1941-03-13, 72 y.o.   MRN: [id number]  HPI Here to follow up on lipids and HTN. His BP is stable. He stopped taking Lipitor recently because he was getting aching pains in the shoulders, the back, and the hips. He had a similar reaction to Zocor some years ago.    Review of Systems  Constitutional: Negative.   Respiratory: Negative.   Cardiovascular: Negative.   Musculoskeletal: Positive for myalgias.       Objective:   Physical Exam  Constitutional: He appears well-developed and well-nourished.  Cardiovascular: Normal rate, regular rhythm, normal heart sounds and intact distal pulses.   Pulmonary/Chest: Effort normal and breath sounds normal.  Musculoskeletal: Normal range of motion. He exhibits no edema and no tenderness.          Assessment & Plan:  He is having side effects from the Lipitor so he will stay off this. We will try to avoid all statins. He will redouble his dietary efforts. Use fish oil and flax seed. His HTN is stable.

## 2012-10-05 ENCOUNTER — Ambulatory Visit (INDEPENDENT_AMBULATORY_CARE_PROVIDER_SITE_OTHER): Payer: Medicare Other | Admitting: Family Medicine

## 2012-10-05 VITALS — BP 189/88 | HR 60

## 2012-10-05 DIAGNOSIS — I4891 Unspecified atrial fibrillation: Secondary | ICD-10-CM

## 2012-10-05 LAB — POCT INR: INR: 2.6

## 2012-11-08 ENCOUNTER — Encounter: Payer: Medicare Other | Admitting: Family Medicine

## 2012-11-09 ENCOUNTER — Ambulatory Visit (INDEPENDENT_AMBULATORY_CARE_PROVIDER_SITE_OTHER): Payer: Medicare Other | Admitting: Family Medicine

## 2012-11-09 VITALS — BP 147/82 | HR 61

## 2012-11-09 DIAGNOSIS — I4891 Unspecified atrial fibrillation: Secondary | ICD-10-CM

## 2012-11-09 NOTE — Progress Notes (Signed)
  Subjective:    Patient ID: Tanner Hensley, male    DOB: 02-19-1941, 72 y.o.   MRN: [id number] Pt notified of coumadin dosage change. HPI    Review of Systems     Objective:   Physical Exam        Assessment & Plan:

## 2012-11-09 NOTE — Progress Notes (Signed)
  Subjective:    Patient ID: Tanner Hensley, male    DOB: 04/17/1941, 72 y.o.   MRN: [id number] Pt here for INR check.  Results are 3.8.  Pt states he takes 6mg  on tue & sat; 5mg  on all other days. HPI    Review of Systems     Objective:   Physical Exam        Assessment & Plan:

## 2012-11-09 NOTE — Patient Instructions (Signed)
Please make sure to go over dosing with him. He wan't taking it the way I had in the system from last time.

## 2012-11-30 ENCOUNTER — Telehealth: Payer: Self-pay | Admitting: *Deleted

## 2012-11-30 NOTE — Progress Notes (Signed)
This encounter was created in error - please disregard.  This encounter was created in error - please disregard.  This encounter was created in error - please disregard.  This encounter was created in error - please disregard.  This encounter was created in error - please disregard.  This encounter was created in error - please disregard.

## 2012-11-30 NOTE — Progress Notes (Signed)
This encounter was created in error - please disregard.

## 2012-12-29 ENCOUNTER — Encounter: Payer: Self-pay | Admitting: Family Medicine

## 2012-12-29 ENCOUNTER — Ambulatory Visit (INDEPENDENT_AMBULATORY_CARE_PROVIDER_SITE_OTHER): Payer: Medicare Other | Admitting: Family Medicine

## 2012-12-29 ENCOUNTER — Telehealth: Payer: Self-pay | Admitting: Family Medicine

## 2012-12-29 VITALS — BP 130/70 | HR 67 | Temp 98.9°F | Wt 159.0 lb

## 2012-12-29 DIAGNOSIS — M546 Pain in thoracic spine: Secondary | ICD-10-CM

## 2012-12-29 MED ORDER — CYCLOBENZAPRINE HCL 10 MG PO TABS: 10.0000 mg | ORAL_TABLET | Freq: Three times a day (TID) | ORAL | Status: DC | PRN

## 2012-12-29 MED ORDER — METHYLPREDNISOLONE ACETATE 80 MG/ML IJ SUSP
120.0000 mg | Freq: Once | INTRAMUSCULAR | Status: AC
  Administered 2012-12-29: 120 mg via INTRAMUSCULAR

## 2012-12-29 MED ORDER — HYDROMORPHONE HCL 4 MG PO TABS: 4.0000 mg | ORAL_TABLET | Freq: Four times a day (QID) | ORAL | Status: DC | PRN

## 2012-12-29 NOTE — Telephone Encounter (Signed)
Patient Information:  Caller Name: Tanner Hensley  Phone: [phone]  Patient: Tanner Hensley, Tanner Hensley  Gender: Male  DOB: 13-Sep-1940  Age: 72 Years  PCP: Gershon Crane [facility])  Office Follow Up:  Does the office need to follow up with this patient?: No  Instructions For The Office: N/A   Symptoms  Reason For Call & Symptoms: Tanner Hensley /Wife states [name] was seen in office on 12/29/12 for muscle strain. Was given Depo Medrol injection and prescription for Hydromorphone and Flexeril. [name] states Flexeril is no longer covered by Medicare. States she has Flexeril 10 mg tabs that expired on 07/09/2012. Also, has Zanaflex 4 mg tabs at home.  Asking may Behr use these tabs? Dr. Fabian Sharp notified.  Advised to use Flexeril tabs as that is what Dr. Clent Ridges subscribed. Do not use Zanaflex. Advised Flexeril tabs may have lost some potency but may use. Callback if needed.  Reviewed Health History In EMR: Yes  Reviewed Medications In EMR: Yes  Reviewed Allergies In EMR: Yes  Reviewed Surgeries / Procedures: Yes  Date of Onset of Symptoms: 12/29/2012  Guideline(s) Used:  No Protocol Available - Sick Adult  Disposition Per Guideline:   Callback by PCP or Subspecialist within 1 Hour  Reason For Disposition Reached:   Nursing judgment  Advice Given:  Call Back If:  You become worse.  Patient Will Follow Care Advice:  YES

## 2012-12-29 NOTE — Progress Notes (Signed)
  Subjective:    Patient ID: Tanner Hensley, male    DOB: May 14, 1941, 72 y.o.   MRN: [id number]  HPI Here for 4 days of sharp pains in the right upper back which radiate to the right shoulder. No recent trauma. He had been doing a lot of yard work last weekend. He has been using heat and Tylenol.    Review of Systems  Constitutional: Negative.   Respiratory: Negative.   Cardiovascular: Negative.   Musculoskeletal: Positive for back pain.       Objective:   Physical Exam  Constitutional: He appears well-developed and well-nourished.  In some pain  Pulmonary/Chest: Effort normal and breath sounds normal.  Musculoskeletal:  Tender in the right upper back and right upper trapezius. ROM if the neck and thoracic spine are limited. Lots of spasm          Assessment & Plan:  This is a muscle strain with spasm. Use Dilaudid fir severe pain. Rest, heat. Add Flexeril prn

## 2012-12-29 NOTE — Addendum Note (Signed)
Addended by: Aniceto Boss A on: 12/29/2012 01:17 PM   Modules accepted: Orders

## 2013-01-01 ENCOUNTER — Telehealth: Payer: Self-pay | Admitting: Family Medicine

## 2013-01-01 NOTE — Telephone Encounter (Signed)
I spoke with pt and he is going to try the Zanaflex 4 mg, okay per Dr. Clent Ridges, I did update medication list

## 2013-01-01 NOTE — Telephone Encounter (Signed)
Request to change Flexeril to Meloxicam, insurance will not cover the Flexeril.

## 2013-01-01 NOTE — Telephone Encounter (Signed)
No he cannot take Meloxicam since he is on Coumadin. He needs a muscle relaxer, so if insurance won't cover Flexeril what WILL they cover?

## 2013-02-07 ENCOUNTER — Ambulatory Visit (INDEPENDENT_AMBULATORY_CARE_PROVIDER_SITE_OTHER): Payer: Medicare Other | Admitting: Family Medicine

## 2013-02-07 VITALS — BP 123/73 | HR 60

## 2013-02-07 DIAGNOSIS — I4891 Unspecified atrial fibrillation: Secondary | ICD-10-CM

## 2013-02-07 LAB — POCT INR: INR: 2.4

## 2013-02-07 NOTE — Progress Notes (Signed)
  Subjective:    Patient ID: Tanner Hensley, male    DOB: [birth year], 72 y.o.   MRN: [id number]  HPI    Review of Systems     Objective:   Physical Exam        Assessment & Plan:  Pt notified of instructions and recheck 2 weeks. Barry Dienes, LPN

## 2013-02-16 ENCOUNTER — Encounter: Payer: Self-pay | Admitting: Gastroenterology

## 2013-03-27 ENCOUNTER — Ambulatory Visit (INDEPENDENT_AMBULATORY_CARE_PROVIDER_SITE_OTHER): Payer: Medicare Other | Admitting: Family Medicine

## 2013-03-27 ENCOUNTER — Other Ambulatory Visit: Payer: Self-pay | Admitting: *Deleted

## 2013-03-27 ENCOUNTER — Telehealth: Payer: Self-pay | Admitting: *Deleted

## 2013-03-27 ENCOUNTER — Encounter: Payer: Self-pay | Admitting: *Deleted

## 2013-03-27 VITALS — BP 159/92 | HR 59

## 2013-03-27 DIAGNOSIS — I4891 Unspecified atrial fibrillation: Secondary | ICD-10-CM

## 2013-03-27 LAB — POCT INR: INR: 1.8

## 2013-03-27 MED ORDER — WARFARIN SODIUM 1 MG PO TABS: 1.0000 mg | ORAL_TABLET | ORAL | Status: DC

## 2013-03-27 NOTE — Telephone Encounter (Signed)
rx sent

## 2013-03-27 NOTE — Telephone Encounter (Signed)
Pt calls & states that he is going to need 1mg  of the coumadin for the new dosage instructions.

## 2013-03-27 NOTE — Progress Notes (Signed)
  Subjective:    Patient ID: Tanner Hensley, male    DOB: 08-Jun-1941, 72 y.o.   MRN: [id number] INR is 1.8 today.  Pt states his dose is 5mg  every day. Donne Anon, CMA HPI    Review of Systems     Objective:   Physical Exam        Assessment & Plan:

## 2013-03-27 NOTE — Progress Notes (Signed)
  Subjective:    Patient ID: Tanner Hensley, male    DOB: Aug 09, 1941, 72 y.o.   MRN: [id number] Pt notified of new dosage instructions. Donne Anon, CMA HPI    Review of Systems     Objective:   Physical Exam        Assessment & Plan:

## 2013-03-27 NOTE — Telephone Encounter (Signed)
Ok to send over new rx.

## 2013-04-27 ENCOUNTER — Ambulatory Visit (INDEPENDENT_AMBULATORY_CARE_PROVIDER_SITE_OTHER): Payer: Medicare Other | Admitting: Family Medicine

## 2013-04-27 VITALS — BP 119/62 | HR 62

## 2013-04-27 DIAGNOSIS — I4891 Unspecified atrial fibrillation: Secondary | ICD-10-CM

## 2013-04-27 LAB — POCT INR: INR: 2.4

## 2013-04-27 NOTE — Progress Notes (Signed)
  Subjective:    Patient ID: Tanner Hensley, male    DOB: 03/08/1941, 72 y.o.   MRN: [id number]  HPI Patient advised to stay on same dose and repeat in 3 weeks.    Review of Systems     Objective:   Physical Exam        Assessment & Plan:

## 2013-05-23 ENCOUNTER — Other Ambulatory Visit: Payer: Self-pay | Admitting: Family Medicine

## 2013-06-15 ENCOUNTER — Ambulatory Visit (INDEPENDENT_AMBULATORY_CARE_PROVIDER_SITE_OTHER): Payer: Medicare Other | Admitting: Family Medicine

## 2013-06-15 ENCOUNTER — Encounter: Payer: Self-pay | Admitting: *Deleted

## 2013-06-15 VITALS — BP 130/76 | HR 64

## 2013-06-15 DIAGNOSIS — I4891 Unspecified atrial fibrillation: Secondary | ICD-10-CM

## 2013-06-15 NOTE — Progress Notes (Signed)
Pt's spouse notified.

## 2013-06-28 ENCOUNTER — Other Ambulatory Visit: Payer: Self-pay | Admitting: Family Medicine

## 2013-07-03 ENCOUNTER — Other Ambulatory Visit: Payer: Self-pay | Admitting: Family Medicine

## 2013-07-26 ENCOUNTER — Encounter: Payer: Self-pay | Admitting: Family Medicine

## 2013-07-26 ENCOUNTER — Ambulatory Visit (INDEPENDENT_AMBULATORY_CARE_PROVIDER_SITE_OTHER): Payer: Medicare Other | Admitting: Family Medicine

## 2013-07-26 VITALS — BP 120/70 | HR 74 | Temp 98.0°F | Ht 65.0 in | Wt 146.0 lb

## 2013-07-26 DIAGNOSIS — Z0000 Encounter for general adult medical examination without abnormal findings: Secondary | ICD-10-CM

## 2013-07-26 DIAGNOSIS — Z23 Encounter for immunization: Secondary | ICD-10-CM

## 2013-07-26 DIAGNOSIS — E119 Type 2 diabetes mellitus without complications: Secondary | ICD-10-CM

## 2013-07-26 DIAGNOSIS — Z125 Encounter for screening for malignant neoplasm of prostate: Secondary | ICD-10-CM

## 2013-07-26 DIAGNOSIS — I10 Essential (primary) hypertension: Secondary | ICD-10-CM

## 2013-07-26 LAB — HEPATIC FUNCTION PANEL
ALT: 17 U/L (ref 0–53)
AST: 26 U/L (ref 0–37)
Albumin: 3.9 g/dL (ref 3.5–5.2)
Alkaline Phosphatase: 72 U/L (ref 39–117)
Total Protein: 6.4 g/dL (ref 6.0–8.3)

## 2013-07-26 LAB — TSH: TSH: 1.06 u[IU]/mL (ref 0.35–5.50)

## 2013-07-26 LAB — BASIC METABOLIC PANEL
BUN: 13 mg/dL (ref 6–23)
CO2: 30 mEq/L (ref 19–32)
Calcium: 9.8 mg/dL (ref 8.4–10.5)
Chloride: 106 mEq/L (ref 96–112)
Creatinine, Ser: 0.9 mg/dL (ref 0.4–1.5)

## 2013-07-26 LAB — POCT URINALYSIS DIPSTICK
Leukocytes, UA: NEGATIVE
Nitrite, UA: NEGATIVE
Protein, UA: NEGATIVE
Urobilinogen, UA: 0.2
pH, UA: 6

## 2013-07-26 LAB — PSA: PSA: 1.15 ng/mL (ref 0.10–4.00)

## 2013-07-26 LAB — CBC WITH DIFFERENTIAL/PLATELET
Basophils Absolute: 0 10*3/uL (ref 0.0–0.1)
Eosinophils Absolute: 0.1 10*3/uL (ref 0.0–0.7)
Lymphocytes Relative: 23.8 % (ref 12.0–46.0)
MCHC: 34.4 g/dL (ref 30.0–36.0)
Monocytes Relative: 11.5 % (ref 3.0–12.0)
Neutro Abs: 3.2 10*3/uL (ref 1.4–7.7)
Neutrophils Relative %: 61.9 % (ref 43.0–77.0)
Platelets: 153 10*3/uL (ref 150.0–400.0)
RBC: 3.76 Mil/uL — ABNORMAL LOW (ref 4.22–5.81)
RDW: 12.9 % (ref 11.5–14.6)

## 2013-07-26 LAB — LIPID PANEL
Cholesterol: 187 mg/dL (ref 0–200)
HDL: 41.6 mg/dL (ref >39.00)
LDL Cholesterol: 130 mg/dL — ABNORMAL HIGH (ref 0–99)
Triglycerides: 76 mg/dL (ref 0.0–149.0)

## 2013-07-26 LAB — HEMOGLOBIN A1C: Hgb A1c MFr Bld: 5.7 % (ref 4.6–6.5)

## 2013-07-26 MED ORDER — LISINOPRIL 10 MG PO TABS: 10.0000 mg | ORAL_TABLET | Freq: Every day | ORAL | Status: DC

## 2013-07-26 MED ORDER — OXYBUTYNIN CHLORIDE ER 10 MG PO TB24: 10.0000 mg | ORAL_TABLET | Freq: Every day | ORAL | Status: DC

## 2013-07-26 MED ORDER — ALENDRONATE SODIUM 70 MG PO TABS: ORAL_TABLET | ORAL | Status: DC

## 2013-07-26 NOTE — Progress Notes (Signed)
  Subjective:    Patient ID: Tanner Hensley, male    DOB: February 22, 1941, 72 y.o.   MRN: [id number]  HPI 72 yr old male for a cpx. He feels well but has some concerns. He has noticed trouble hearing for several years but has not had this evaluated yet. He also is worried about some memory loss. Every day he often forgets what he meant to do or he may ask the same question to his wife several times. His wife thinks his hearing may be part of the issue. Also he has had some urinary incontinence for the past few months. This seems to be an overactive bladder problem because he feels urgency and sometimes does not make it to the bathroom in time. No burning or discomfort.    Review of Systems  Constitutional: Negative.   HENT: Positive for hearing loss. Negative for congestion, dental problem, ear discharge, ear pain, facial swelling, nosebleeds, postnasal drip, rhinorrhea, sinus pressure, sore throat, tinnitus, trouble swallowing and voice change.   Eyes: Negative.   Respiratory: Negative.   Cardiovascular: Negative.   Gastrointestinal: Negative.   Genitourinary: Positive for urgency. Negative for dysuria, frequency, hematuria, flank pain, decreased urine volume, discharge, penile swelling, scrotal swelling, enuresis, difficulty urinating, genital sores, penile pain and testicular pain.  Musculoskeletal: Negative.   Skin: Negative.   Neurological: Negative.   Psychiatric/Behavioral: Negative.        Objective:   Physical Exam  Constitutional: He is oriented to person, place, and time. He appears well-developed and well-nourished. No distress.  HENT:  Head: Normocephalic and atraumatic.  Right Ear: External ear normal.  Left Ear: External ear normal.  Nose: Nose normal.  Mouth/Throat: Oropharynx is clear and moist. No oropharyngeal exudate.  Eyes: Conjunctivae and EOM are normal. Pupils are equal, round, and reactive to light. Right eye exhibits no discharge. Left eye exhibits no discharge.  No scleral icterus.  Neck: Neck supple. No JVD present. No tracheal deviation present. No thyromegaly present.  Cardiovascular: Normal rate, regular rhythm, normal heart sounds and intact distal pulses.  Exam reveals no gallop and no friction rub.   No murmur heard. EKG shows a paced rhythm   Pulmonary/Chest: Effort normal and breath sounds normal. No respiratory distress. He has no wheezes. He has no rales. He exhibits no tenderness.  Abdominal: Soft. Bowel sounds are normal. He exhibits no distension and no mass. There is no tenderness. There is no rebound and no guarding.  Genitourinary: Rectum normal, prostate normal and penis normal. Guaiac negative stool. No penile tenderness.  Musculoskeletal: Normal range of motion. He exhibits no edema and no tenderness.  Lymphadenopathy:    He has no cervical adenopathy.  Neurological: He is alert and oriented to person, place, and time. He has normal reflexes. No cranial nerve deficit. He exhibits normal muscle tone. Coordination normal.  Skin: Skin is warm and dry. No rash noted. He is not diaphoretic. No erythema. No pallor.  Psychiatric: He has a normal mood and affect. His behavior is normal. Judgment and thought content normal.          Assessment & Plan:  Well exam. Get fasting labs. I encouraged him to have a formal audiology evaluation, and then we can assess memory issues after that. Try Oxybutynin for the incontinence.

## 2013-07-26 NOTE — Progress Notes (Signed)
Pre visit review using our clinic review tool, if applicable. No additional management support is needed unless otherwise documented below in the visit note. 

## 2013-07-27 ENCOUNTER — Telehealth: Payer: Self-pay | Admitting: Family Medicine

## 2013-07-27 NOTE — Telephone Encounter (Signed)
I left a voice message with the below information. 

## 2013-07-27 NOTE — Telephone Encounter (Signed)
We no longer have any samples. Apparently he has chosen to stay off meds for now.

## 2013-07-27 NOTE — Telephone Encounter (Signed)
I spoke with pt's wife and she will find out from insurance company what would be cheaper.

## 2013-07-27 NOTE — Telephone Encounter (Signed)
Pt can not afford oxybutynin. Please call a different med [facility]

## 2013-07-27 NOTE — Telephone Encounter (Signed)
Ask him to check with his insurance company as to what would be cheaper (ex- Detrol or Vesicare)

## 2013-07-27 NOTE — Telephone Encounter (Signed)
Pt stated detrol is not covered and vesicare cost more than oxybutynin. Pt would like samples of vesicare. Pt stated he will do without medication

## 2013-09-20 ENCOUNTER — Other Ambulatory Visit: Payer: Self-pay | Admitting: *Deleted

## 2013-09-20 ENCOUNTER — Ambulatory Visit (INDEPENDENT_AMBULATORY_CARE_PROVIDER_SITE_OTHER): Payer: Medicare Other | Admitting: Family Medicine

## 2013-09-20 ENCOUNTER — Encounter: Payer: Self-pay | Admitting: *Deleted

## 2013-09-20 VITALS — BP 154/87 | HR 69

## 2013-09-20 DIAGNOSIS — I4891 Unspecified atrial fibrillation: Secondary | ICD-10-CM

## 2013-09-20 LAB — POCT INR: INR: 3.2

## 2013-09-20 MED ORDER — WARFARIN SODIUM 5 MG PO TABS: 5.0000 mg | ORAL_TABLET | Freq: Every day | ORAL | Status: DC

## 2013-09-20 MED ORDER — WARFARIN SODIUM 1 MG PO TABS
ORAL_TABLET | ORAL | Status: DC
Start: 2013-09-20 — End: 2013-11-28

## 2013-09-20 NOTE — Progress Notes (Signed)
   Subjective:    Patient ID: Tanner Hensley, male    DOB: [birth year], 73 y.o.   MRN: [id number] Left new coumadin dosing instructions on patient's vm. Beatris Ship, CMA  HPI    Review of Systems     Objective:   Physical Exam        Assessment & Plan:

## 2013-10-10 ENCOUNTER — Ambulatory Visit: Payer: Medicare Other

## 2013-10-15 ENCOUNTER — Telehealth: Payer: Self-pay | Admitting: *Deleted

## 2013-10-15 ENCOUNTER — Ambulatory Visit (INDEPENDENT_AMBULATORY_CARE_PROVIDER_SITE_OTHER): Payer: Medicare Other | Admitting: Family Medicine

## 2013-10-15 ENCOUNTER — Encounter: Payer: Self-pay | Admitting: *Deleted

## 2013-10-15 VITALS — BP 139/84 | HR 70 | Wt 155.0 lb

## 2013-10-15 DIAGNOSIS — I4891 Unspecified atrial fibrillation: Secondary | ICD-10-CM

## 2013-10-15 LAB — POCT INR: INR: 2.8

## 2013-10-15 NOTE — Progress Notes (Signed)
No missed doses, changes in diet, no bruising, SOB or CP.Marland KitchenAudelia Hives Hensley

## 2013-10-15 NOTE — Telephone Encounter (Signed)
INR is now therapeutic, no change to coumadin reigmen. Repeat INR in one week and if therapeutic can space out readings 5mg  every day.Tanner Hensley [place]

## 2013-10-16 ENCOUNTER — Ambulatory Visit: Payer: Medicare Other

## 2013-11-27 LAB — PROTIME-INR

## 2013-11-28 ENCOUNTER — Ambulatory Visit: Payer: Self-pay | Admitting: Family Medicine

## 2013-11-28 ENCOUNTER — Telehealth: Payer: Self-pay | Admitting: *Deleted

## 2013-11-28 DIAGNOSIS — I4891 Unspecified atrial fibrillation: Secondary | ICD-10-CM

## 2013-11-28 LAB — PROTIME-INR: INR: 2.8 — AB (ref 0.9–1.1)

## 2013-11-28 MED ORDER — WARFARIN SODIUM 5 MG PO TABS: 5.0000 mg | ORAL_TABLET | Freq: Every day | ORAL | Status: DC

## 2013-11-28 MED ORDER — WARFARIN SODIUM 1 MG PO TABS: ORAL_TABLET | ORAL | Status: DC

## 2013-11-28 NOTE — Telephone Encounter (Signed)
No change to dose. 5mg  every day. Please verify that is what he is actually taking.  Pt informed. He takes 5mg . He is having a Cardioversion on Friday.Audelia Hives [place]

## 2014-02-06 ENCOUNTER — Ambulatory Visit (INDEPENDENT_AMBULATORY_CARE_PROVIDER_SITE_OTHER): Payer: Medicare Other | Admitting: Family Medicine

## 2014-02-06 DIAGNOSIS — I4891 Unspecified atrial fibrillation: Secondary | ICD-10-CM

## 2014-02-06 LAB — POCT INR: INR: 2

## 2014-02-06 NOTE — Progress Notes (Signed)
Called home number; no answer and no vm. 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 I told the pt in the office at the time of appt that he would prob stay the same since his INR was perfect. Pt agreed and has been on the same regimen of coumadin for a while

## 2014-02-08 ENCOUNTER — Encounter: Payer: Self-pay | Admitting: Family Medicine

## 2014-02-08 ENCOUNTER — Ambulatory Visit (INDEPENDENT_AMBULATORY_CARE_PROVIDER_SITE_OTHER): Payer: Medicare Other | Admitting: Family Medicine

## 2014-02-08 VITALS — BP 118/75 | HR 89 | Temp 98.0°F | Ht 65.0 in | Wt 150.0 lb

## 2014-02-08 DIAGNOSIS — R413 Other amnesia: Secondary | ICD-10-CM

## 2014-02-08 DIAGNOSIS — I10 Essential (primary) hypertension: Secondary | ICD-10-CM

## 2014-02-08 DIAGNOSIS — I4891 Unspecified atrial fibrillation: Secondary | ICD-10-CM

## 2014-02-08 LAB — CBC WITH DIFFERENTIAL/PLATELET
Basophils Absolute: 0 10*3/uL (ref 0.0–0.1)
Basophils Relative: 0.5 % (ref 0.0–3.0)
EOS ABS: 0.1 10*3/uL (ref 0.0–0.7)
Eosinophils Relative: 2.6 % (ref 0.0–5.0)
HCT: 38.6 % — ABNORMAL LOW (ref 39.0–52.0)
Hemoglobin: 13.1 g/dL (ref 13.0–17.0)
LYMPHS PCT: 22 % (ref 12.0–46.0)
Lymphs Abs: 1.1 10*3/uL (ref 0.7–4.0)
MCHC: 33.9 g/dL (ref 30.0–36.0)
MCV: 99.7 fl (ref 78.0–100.0)
Monocytes Absolute: 0.8 10*3/uL (ref 0.1–1.0)
Monocytes Relative: 15.6 % — ABNORMAL HIGH (ref 3.0–12.0)
NEUTROS PCT: 59.3 % (ref 43.0–77.0)
Neutro Abs: 3.1 10*3/uL (ref 1.4–7.7)
Platelets: 148 10*3/uL — ABNORMAL LOW (ref 150.0–400.0)
RBC: 3.87 Mil/uL — ABNORMAL LOW (ref 4.22–5.81)
RDW: 13 % (ref 11.5–15.5)
WBC: 5.2 10*3/uL (ref 4.0–10.5)

## 2014-02-08 LAB — BASIC METABOLIC PANEL
BUN: 13 mg/dL (ref 6–23)
CALCIUM: 9.7 mg/dL (ref 8.4–10.5)
CHLORIDE: 104 meq/L (ref 96–112)
CO2: 30 mEq/L (ref 19–32)
CREATININE: 0.9 mg/dL (ref 0.4–1.5)
GFR: 93.79 mL/min (ref >60.00)
Glucose, Bld: 73 mg/dL (ref 70–99)
Potassium: 4.6 mEq/L (ref 3.5–5.1)
Sodium: 139 mEq/L (ref 135–145)

## 2014-02-08 LAB — HEPATIC FUNCTION PANEL
ALBUMIN: 3.6 g/dL (ref 3.5–5.2)
ALK PHOS: 71 U/L (ref 39–117)
ALT: 15 U/L (ref 0–53)
AST: 25 U/L (ref 0–37)
Bilirubin, Direct: 0.1 mg/dL (ref 0.0–0.3)
TOTAL PROTEIN: 6.3 g/dL (ref 6.0–8.3)
Total Bilirubin: 0.6 mg/dL (ref 0.2–1.2)

## 2014-02-08 LAB — VITAMIN B12: Vitamin B-12: 224 pg/mL (ref 211–911)

## 2014-02-08 LAB — TSH: TSH: 1.34 u[IU]/mL (ref 0.35–4.50)

## 2014-02-08 NOTE — Progress Notes (Signed)
   Subjective:    Patient ID: Tanner Hensley, male    DOB: May 13, 1941, 73 y.o.   MRN: [id number]  HPI Here with his wife to discuss memory loss. He has noticed some problems with short term memory for several years but lately is has gotten worse. He relates stories of putting his glasses down and not remembering where they are, of going into a room to do something and forgetting why he is there, etc. His wife corroborates this. He feels well otherwise. He has had 2 cardioversions at [facility] this spring. The first time did not work well but he has been in sinus rhythm since the second one, especially after his dose of Sotalol was increased.    Review of Systems  Constitutional: Negative.   Neurological: Negative.        Objective:   Physical Exam  Constitutional: He is oriented to person, place, and time. He appears well-developed and well-nourished.  Cardiovascular: Normal rate, regular rhythm, normal heart sounds and intact distal pulses.   Pulmonary/Chest: Effort normal and breath sounds normal.  Neurological: He is alert and oriented to person, place, and time. He has normal reflexes. No cranial nerve deficit. He exhibits normal muscle tone. Coordination normal.          Assessment & Plan:  We will get some labs today. Refer to Neurology

## 2014-02-08 NOTE — Progress Notes (Signed)
Pre visit review using our clinic review tool, if applicable. No additional management support is needed unless otherwise documented below in the visit note. 

## 2014-02-11 ENCOUNTER — Telehealth: Payer: Self-pay | Admitting: Family Medicine

## 2014-02-11 NOTE — Telephone Encounter (Signed)
Relevant patient education mailed to patient.  

## 2014-02-18 ENCOUNTER — Ambulatory Visit (INDEPENDENT_AMBULATORY_CARE_PROVIDER_SITE_OTHER): Payer: Medicare Other | Admitting: Neurology

## 2014-02-18 ENCOUNTER — Encounter: Payer: Self-pay | Admitting: Neurology

## 2014-02-18 VITALS — BP 105/68 | Ht 65.0 in | Wt 151.8 lb

## 2014-02-18 DIAGNOSIS — R413 Other amnesia: Secondary | ICD-10-CM

## 2014-02-18 NOTE — Patient Instructions (Signed)
1. CT head without contrast 2. Physical and brain stimulation exercises are important for brain health 3. Follow-up in 6 months
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
## 2014-02-20 ENCOUNTER — Ambulatory Visit (HOSPITAL_COMMUNITY): Payer: Medicare Other

## 2014-02-21 ENCOUNTER — Ambulatory Visit (HOSPITAL_COMMUNITY)
Admission: RE | Admit: 2014-02-21 | Discharge: 2014-02-21 | Disposition: A | Discharge status: Home / Self Care | Payer: Medicare Other | Source: Ambulatory Visit | Attending: Neurology | Admitting: Neurology

## 2014-02-21 DIAGNOSIS — R413 Other amnesia: Secondary | ICD-10-CM | POA: Insufficient documentation

## 2014-02-21 DIAGNOSIS — I6789 Other cerebrovascular disease: Secondary | ICD-10-CM | POA: Insufficient documentation

## 2014-02-22 DIAGNOSIS — R413 Other amnesia: Secondary | ICD-10-CM | POA: Insufficient documentation

## 2014-02-28 ENCOUNTER — Telehealth: Payer: Self-pay | Admitting: Neurology

## 2014-02-28 NOTE — Telephone Encounter (Signed)
Pt called f/u on the results for his CAT-SCAN  C/b [phone]

## 2014-03-01 NOTE — Telephone Encounter (Signed)
Patient notified

## 2014-03-06 ENCOUNTER — Encounter: Payer: Self-pay | Admitting: Physician Assistant

## 2014-03-06 ENCOUNTER — Ambulatory Visit (INDEPENDENT_AMBULATORY_CARE_PROVIDER_SITE_OTHER): Payer: Medicare Other | Admitting: Physician Assistant

## 2014-03-06 VITALS — BP 128/84 | HR 72 | Temp 98.5°F | Resp 18 | Wt 150.0 lb

## 2014-03-06 DIAGNOSIS — M545 Low back pain, unspecified: Secondary | ICD-10-CM

## 2014-03-06 DIAGNOSIS — R109 Unspecified abdominal pain: Secondary | ICD-10-CM

## 2014-03-06 LAB — POCT URINALYSIS DIPSTICK
Bilirubin, UA: NEGATIVE
Glucose, UA: NEGATIVE
Ketones, UA: NEGATIVE
LEUKOCYTES UA: NEGATIVE
Nitrite, UA: NEGATIVE
PROTEIN UA: NEGATIVE
SPEC GRAV UA: 1.02
Urobilinogen, UA: 1
pH, UA: 6.5

## 2014-03-06 MED ORDER — METHYLPREDNISOLONE (PAK) 4 MG PO TABS: ORAL_TABLET | ORAL | Status: DC

## 2014-03-06 MED ORDER — CYCLOBENZAPRINE HCL 5 MG PO TABS: 5.0000 mg | ORAL_TABLET | Freq: Three times a day (TID) | ORAL | Status: DC | PRN

## 2014-03-06 NOTE — Patient Instructions (Signed)
Medrol Dosepak, follow pack instructions, take with food.  Flexeril 3 times per day as needed for muscle relaxation. Do not drive while taking this medication as it will inhibit your ability to operate a motor vehicle.  Continue Tylenol for pain relief as needed.  Continue using ice and heat for comfort.  Stop taking hydromorphone, take this to your pharmacy in order to discard it appropriately.  If emergency symptoms discussed during visit developed, seek medical attention immediately.  Followup as needed, or for worsening or persistent symptoms despite treatment.    Back Pain, Adult Back pain is very common. The pain often gets better over time. The cause of back pain is usually not dangerous. Most people can learn to manage their back pain on their own.  HOME CARE   Stay active. Start with short walks on flat ground if you can. Try to walk farther each day.  Do not sit, drive, or stand in one place for more than 30 minutes. Do not stay in bed.  Do not avoid exercise or work. Activity can help your back heal faster.  Be careful when you bend or lift an object. Bend at your knees, keep the object close to you, and do not twist.  Sleep on a firm mattress. Lie on your side, and bend your knees. If you lie on your back, put a pillow under your knees.  Only take medicines as told by your doctor.  Put ice on the injured area.  Put ice in a plastic bag.  Place a towel between your skin and the bag.  Leave the ice on for 15-20 minutes, 03-04 times a day for the first 2 to 3 days. After that, you can switch between ice and heat packs.  Ask your doctor about back exercises or massage.  Avoid feeling anxious or stressed. Find good ways to deal with stress, such as exercise. GET HELP RIGHT AWAY IF:   Your pain does not go away with rest or medicine.  Your pain does not go away in 1 week.  You have new problems.  You do not feel well.  The pain spreads into your legs.  You  cannot control when you poop (bowel movement) or pee (urinate).  Your arms or legs feel weak or lose feeling (numbness).  You feel sick to your stomach (nauseous) or throw up (vomit).  You have belly (abdominal) pain.  You feel like you may pass out (faint). MAKE SURE YOU:   Understand these instructions.  Will watch your condition.  Will get help right away if you are not doing well or get worse. Document Released: 02/09/2008 Document Revised: 11/15/2011 Document Reviewed: 01/11/2011 [facility] Patient Information 2015 [place], [place]. This information is not intended to replace advice given to you by your health care provider. Make sure you discuss any questions you have with your health care provider.

## 2014-03-06 NOTE — Progress Notes (Signed)
Pre visit review using our clinic review tool, if applicable. No additional management support is needed unless otherwise documented below in the visit note. 

## 2014-03-06 NOTE — Progress Notes (Signed)
Subjective:    Patient ID: Tanner Hensley, male    DOB: 06/04/1941, 73 y.o.   MRN: [id number]  Back Pain This is a new problem. The current episode started 1 to 4 weeks ago (about 8 days). The problem occurs constantly. The problem has been waxing and waning since onset. The pain is present in the lumbar spine and gluteal. The quality of the pain is described as aching. The pain radiates to the right thigh. The pain is at a severity of 10/10. The pain is severe. The pain is the same all the time. The symptoms are aggravated by bending, twisting and position. Associated symptoms include headaches, leg pain and weakness. Pertinent negatives include no abdominal pain, bladder incontinence, bowel incontinence, chest pain, dysuria, fever, numbness, paresis, paresthesias, pelvic pain, perianal numbness, tingling or weight loss. Risk factors include poor posture. He has tried heat and ice (was using old hydromorphone, which he thought was improving the pain, went off of it , then pain returned, so he started back on the hydromorphone.) for the symptoms. The treatment provided mild relief.      Review of Systems  Constitutional: Negative for fever, chills and weight loss.  Respiratory: Negative for shortness of breath.   Cardiovascular: Negative for chest pain.  Gastrointestinal: Negative for nausea, vomiting, abdominal pain, diarrhea and bowel incontinence.  Genitourinary: Negative for bladder incontinence, dysuria and pelvic pain.  Musculoskeletal: Positive for back pain. Negative for gait problem and myalgias.  Neurological: Positive for weakness and headaches. Negative for tingling, numbness and paresthesias.  All other systems reviewed and are negative.  Past Medical History  Diagnosis Date  . High cholesterol   . Insomnia   . Urticaria   . GERD (gastroesophageal reflux disease)   . Hypertension   . Atrial fibrillation     (Dr. Phylliss Blakes at [facility] and Dr. Lovena Le at [facility].  [facility] (Dr. Charlestine Night)  . Diabetes mellitus type II   . Arthritis     sees Dr. Charlestine Night  . Gout   . Polymyalgia rheumatica     sees Dr. Charlestine Night     History   Social History  . Marital Status: Married    Spouse Name: N/A    Number of Children: N/A  . Years of Education: N/A   Occupational History  . Not on file.   Social History Main Topics  . Smoking status: Never Smoker   . Smokeless tobacco: Never Used  . Alcohol Use: No  . Drug Use: No  . Sexual Activity: Not on file   Other Topics Concern  . Not on file   Social History Narrative  . No narrative on file    Past Surgical History  Procedure Laterality Date  . Cardiac ablation x two at [place] [facility]    . Colonoscopy  07/06/10    per Dr. Oretha Caprice, single poly, repeat in 5 yrs.   . Right inguinal herniorrhaphy  11/28/09     per Dr. Donnie Mesa  . Left knee arthroscopy  02/03/10    per Dr. Lindwood Qua    Family History  Problem Relation Age of Onset  . Coronary artery disease Other   . Hypertension Other   . Heart disease Other     Allergies  Allergen Reactions  . Codeine   . Hydrocodone-Acetaminophen   . Lipitor [Atorvastatin]     Myalgias   . Zocor [Simvastatin]     myalgias    Current Outpatient  Prescriptions on File Prior to Visit  Medication Sig Dispense Refill  . alendronate (FOSAMAX) 70 MG tablet TAKE 1 TABLET BY MOUTH EVERY 7 DAYS. TAKE WITH FULL GLASS OF WATER  12 tablet  3  . Ascorbic Acid 500 MG CAPS Take by mouth daily as needed.       Marland Kitchen aspirin 81 MG tablet Take 81 mg by mouth daily.        . Calcium Carbonate-Vitamin D (CALCIUM + D PO) Take by mouth 2 (two) times daily.        . fish oil-omega-3 fatty acids 1000 MG capsule Take 2 g by mouth daily.      Marland Kitchen lisinopril (PRINIVIL,ZESTRIL) 10 MG tablet Take 1 tablet (10 mg total) by mouth daily.  30 tablet  2  . metroNIDAZOLE (METROGEL) 0.75 % gel Apply 1 application topically as needed.      .  Multiple Vitamin (MULTIVITAMIN) tablet Take 1 tablet by mouth daily.        . sotalol (BETAPACE) 120 MG tablet Take 160 mg by mouth 2 (two) times daily.       Marland Kitchen warfarin (COUMADIN) 1 MG tablet TAKE 1 TABLET (1 MG TOTAL) BY MOUTH AS DIRECTED. AS DIRECTED  90 tablet  0  . warfarin (COUMADIN) 5 MG tablet Take 1 tablet (5 mg total) by mouth daily.  90 tablet  0   No current facility-administered medications on file prior to visit.    EXAM: BP 128/84  Pulse 72  Temp(Src) 98.5 F (36.9 C) (Oral)  Resp 18  Wt 150 lb (68.04 kg)     Objective:   Physical Exam  Nursing note and vitals reviewed. Constitutional: He is oriented to person, place, and time. He appears well-developed and well-nourished. No distress.  HENT:  Head: Normocephalic and atraumatic.  Eyes: Conjunctivae and EOM are normal. Pupils are equal, round, and reactive to light.  Neck: Normal range of motion.  Cardiovascular: Normal rate, regular rhythm, normal heart sounds and intact distal pulses.   Pulmonary/Chest: Effort normal and breath sounds normal. No respiratory distress. He exhibits no tenderness.  CVA tenderness to percussion on the right.  Musculoskeletal: Normal range of motion. He exhibits tenderness. He exhibits no edema.  Bilateral paraspinal muscle tenderness in the lumbar spine region. No bony tenderness.    Neurological: He is alert and oriented to person, place, and time. He has normal reflexes. He displays normal reflexes. He exhibits normal muscle tone. Coordination normal.  GAIT normal. SLR positive on the right.  Skin: Skin is warm and dry. No rash noted. He is not diaphoretic. No erythema. No pallor.  Psychiatric: He has a normal mood and affect. His behavior is normal. Judgment and thought content normal.     Lab Results  Component Value Date   WBC 5.2 02/08/2014   HGB 13.1 02/08/2014   HCT 38.6* 02/08/2014   PLT 148.0* 02/08/2014   GLUCOSE 73 02/08/2014   CHOL 187 07/26/2013   TRIG 76.0 07/26/2013    HDL 41.60 07/26/2013   LDLDIRECT 176.1 07/20/2012   LDLCALC 130* 07/26/2013   ALT 15 02/08/2014   AST 25 02/08/2014   NA 139 02/08/2014   K 4.6 02/08/2014   CL 104 02/08/2014   CREATININE 0.9 02/08/2014   BUN 13 02/08/2014   CO2 30 02/08/2014   TSH 1.34 02/08/2014   PSA 1.15 07/26/2013   INR 2.0 02/06/2014   HGBA1C 5.7 07/26/2013   MICROALBUR 2.4* 07/13/2010  Assessment & Plan:  Dhani was seen today for back pain.  Diagnoses and associated orders for this visit:  Flank pain Comments: Urine clear of infection, no urinary symptoms, trace microscopic blood. Will do repeat UA to reassess any blood at next OV. - POCT urinalysis dipstick - cyclobenzaprine (FLEXERIL) 5 MG tablet; Take 1 tablet (5 mg total) by mouth 3 (three) times daily as needed for muscle spasms (No Driving with this.). - methylPREDNIsolone (MEDROL DOSPACK) 4 MG tablet; follow package directions  Acute low back pain Comments: With some radicular symptoms. Will do medrol dose pack, flexeril, and tylenol. Add Ice/Heat therapy. - cyclobenzaprine (FLEXERIL) 5 MG tablet; Take 1 tablet (5 mg total) by mouth 3 (three) times daily as needed for muscle spasms (No Driving with this.). - methylPREDNIsolone (MEDROL DOSPACK) 4 MG tablet; follow package directions    No sign of infection, or urinary symptoms make the CVA area tenderness to percussion and palpation more likely of a musculoskeletal origin. Will treat this and back pain with some radicular symptoms with muscle relaxant, medrol dosepak, and tylenol. Pt will also continue ice and heat alternation.   Microscopic blood in urine will be reassessed with follow up UA at next office visit.  Pt educated on the potential risk of Flexeril associated with the elderly, and that he cannot drive while taking the medication as it causes drowsiness. He acknowledges and is amenable to trial of flexeril.   Return precautions provided, and patient handout on back pain.  Plan to follow up as  needed, or for worsening or persistent symptoms despite treatment.  Patient Instructions  Medrol Dosepak, follow pack instructions, take with food.  Flexeril 3 times per day as needed for muscle relaxation. Do not drive while taking this medication as it will inhibit your ability to operate a motor vehicle.  Continue Tylenol for pain relief as needed.  Continue using ice and heat for comfort.  Stop taking hydromorphone, take this to your pharmacy in order to discard it appropriately.  If emergency symptoms discussed during visit developed, seek medical attention immediately.  Followup as needed, or for worsening or persistent symptoms despite treatment.

## 2014-04-03 ENCOUNTER — Ambulatory Visit (INDEPENDENT_AMBULATORY_CARE_PROVIDER_SITE_OTHER): Payer: Medicare Other | Admitting: Family Medicine

## 2014-04-03 DIAGNOSIS — I4891 Unspecified atrial fibrillation: Secondary | ICD-10-CM

## 2014-04-03 LAB — POCT INR: INR: 2.6

## 2014-04-03 MED ORDER — WARFARIN SODIUM 5 MG PO TABS: 5.0000 mg | ORAL_TABLET | Freq: Every day | ORAL | Status: DC

## 2014-04-03 NOTE — Progress Notes (Signed)
Mr. Conley informed with understanding./Levii Hairfield,CMA

## 2014-05-20 ENCOUNTER — Ambulatory Visit (INDEPENDENT_AMBULATORY_CARE_PROVIDER_SITE_OTHER): Payer: Medicare Other | Admitting: Family Medicine

## 2014-05-20 DIAGNOSIS — Z23 Encounter for immunization: Secondary | ICD-10-CM

## 2014-05-30 ENCOUNTER — Ambulatory Visit (INDEPENDENT_AMBULATORY_CARE_PROVIDER_SITE_OTHER): Payer: Medicare Other | Admitting: Family Medicine

## 2014-05-30 VITALS — BP 156/87 | HR 69 | Ht 65.0 in | Wt 160.0 lb

## 2014-05-30 DIAGNOSIS — I482 Chronic atrial fibrillation, unspecified: Secondary | ICD-10-CM

## 2014-05-30 DIAGNOSIS — I4891 Unspecified atrial fibrillation: Secondary | ICD-10-CM

## 2014-05-30 LAB — POCT INR: INR: 1

## 2014-05-31 ENCOUNTER — Other Ambulatory Visit: Payer: Self-pay | Admitting: Emergency Medicine

## 2014-05-31 MED ORDER — WARFARIN SODIUM 5 MG PO TABS: 5.0000 mg | ORAL_TABLET | Freq: Every day | ORAL | Status: DC

## 2014-05-31 NOTE — Progress Notes (Signed)
   Subjective:    Patient ID: Tanner Hensley, male    DOB: 11-18-1940, 73 y.o.   MRN: [id number] Pt notified of new dosing schedule & to recheck INR in one week. Beatris Ship, CMA HPI    Review of Systems     Objective:   Physical Exam        Assessment & Plan:

## 2014-06-07 ENCOUNTER — Ambulatory Visit (INDEPENDENT_AMBULATORY_CARE_PROVIDER_SITE_OTHER): Payer: Medicare Other | Admitting: Family Medicine

## 2014-06-07 VITALS — BP 168/87 | Wt 157.0 lb

## 2014-06-07 DIAGNOSIS — I482 Chronic atrial fibrillation, unspecified: Secondary | ICD-10-CM

## 2014-06-07 LAB — POCT INR: INR: 2.4

## 2014-06-07 NOTE — Progress Notes (Signed)
Patient notified to follow up in 2 weeks. Margette Fast, CMA

## 2014-06-12 ENCOUNTER — Telehealth: Payer: Self-pay

## 2014-06-12 NOTE — Telephone Encounter (Signed)
Unable to reach patient or leave a voice message.  Mailbox not set up.

## 2014-06-21 ENCOUNTER — Ambulatory Visit (INDEPENDENT_AMBULATORY_CARE_PROVIDER_SITE_OTHER): Payer: Medicare Other | Admitting: Family Medicine

## 2014-06-21 DIAGNOSIS — I482 Chronic atrial fibrillation, unspecified: Secondary | ICD-10-CM

## 2014-06-21 LAB — POCT INR: INR: 3.1

## 2014-06-21 NOTE — Progress Notes (Signed)
Patient advised of recommendations.  

## 2014-08-02 ENCOUNTER — Encounter: Payer: Medicare Other | Admitting: Family Medicine

## 2014-08-09 ENCOUNTER — Ambulatory Visit (INDEPENDENT_AMBULATORY_CARE_PROVIDER_SITE_OTHER): Payer: Medicare Other | Admitting: Family Medicine

## 2014-08-09 ENCOUNTER — Encounter: Payer: Self-pay | Admitting: Family Medicine

## 2014-08-09 VITALS — BP 130/90 | Temp 97.9°F | Ht 65.75 in | Wt 158.0 lb

## 2014-08-09 DIAGNOSIS — N401 Enlarged prostate with lower urinary tract symptoms: Secondary | ICD-10-CM

## 2014-08-09 DIAGNOSIS — E785 Hyperlipidemia, unspecified: Secondary | ICD-10-CM | POA: Diagnosis not present

## 2014-08-09 DIAGNOSIS — E119 Type 2 diabetes mellitus without complications: Secondary | ICD-10-CM | POA: Diagnosis not present

## 2014-08-09 DIAGNOSIS — I10 Essential (primary) hypertension: Secondary | ICD-10-CM

## 2014-08-09 DIAGNOSIS — Z23 Encounter for immunization: Secondary | ICD-10-CM

## 2014-08-09 DIAGNOSIS — Z0000 Encounter for general adult medical examination without abnormal findings: Secondary | ICD-10-CM

## 2014-08-09 DIAGNOSIS — M79644 Pain in right finger(s): Secondary | ICD-10-CM

## 2014-08-09 LAB — POCT URINALYSIS DIPSTICK
BILIRUBIN UA: NEGATIVE
Blood, UA: NEGATIVE
Glucose, UA: NEGATIVE
Ketones, UA: NEGATIVE
LEUKOCYTES UA: NEGATIVE
Nitrite, UA: NEGATIVE
PROTEIN UA: NEGATIVE
Spec Grav, UA: 1.015
Urobilinogen, UA: 0.2
pH, UA: 6

## 2014-08-09 MED ORDER — ALENDRONATE SODIUM 70 MG PO TABS: ORAL_TABLET | ORAL | Status: DC

## 2014-08-09 MED ORDER — LISINOPRIL 10 MG PO TABS
10.0000 mg | ORAL_TABLET | Freq: Every day | ORAL | Status: DC
Start: 2014-08-09 — End: 2014-11-28

## 2014-08-09 NOTE — Progress Notes (Signed)
   Subjective:    Patient ID: Tanner Hensley, male    DOB: Sep 17, 1940, 74 y.o.   MRN: [id number]  HPI 73 yr old male for a cpx. He feels well except for some pain in the right thumb. No hx of trauma but about a month ago it began clicking when he flexed it. Since then it has become very painful and he has started wearing a splint to immobilize it. He had a cardioversion at [facility] in early summer and he has been in sinus rhythm since then. He had a stress test last week but they have not heard any results from that yet.    Review of Systems  Constitutional: Negative.   HENT: Negative.   Eyes: Negative.   Respiratory: Negative.   Cardiovascular: Negative.   Gastrointestinal: Negative.   Genitourinary: Negative.   Musculoskeletal: Negative.   Skin: Negative.   Neurological: Negative.   Psychiatric/Behavioral: Negative.        Objective:   Physical Exam  Constitutional: He is oriented to person, place, and time. He appears well-developed and well-nourished. No distress.  HENT:  Head: Normocephalic and atraumatic.  Right Ear: External ear normal.  Left Ear: External ear normal.  Nose: Nose normal.  Mouth/Throat: Oropharynx is clear and moist. No oropharyngeal exudate.  Eyes: Conjunctivae and EOM are normal. Pupils are equal, round, and reactive to light. Right eye exhibits no discharge. Left eye exhibits no discharge. No scleral icterus.  Neck: Neck supple. No JVD present. No tracheal deviation present. No thyromegaly present.  Cardiovascular: Normal rate, regular rhythm, normal heart sounds and intact distal pulses.  Exam reveals no gallop and no friction rub.   No murmur heard. Pulmonary/Chest: Effort normal and breath sounds normal. No respiratory distress. He has no wheezes. He has no rales. He exhibits no tenderness.  Abdominal: Soft. Bowel sounds are normal. He exhibits no distension and no mass. There is no tenderness. There is no rebound and no guarding.    Genitourinary: Rectum normal, prostate normal and penis normal. Guaiac negative stool. No penile tenderness.  Musculoskeletal: Normal range of motion. He exhibits no edema or tenderness.  The right thumb is not swollen or tender, but he has severe pain on flexion   Lymphadenopathy:    He has no cervical adenopathy.  Neurological: He is alert and oriented to person, place, and time. He has normal reflexes. No cranial nerve deficit. He exhibits normal muscle tone. Coordination normal.  Skin: Skin is warm and dry. No rash noted. He is not diaphoretic. No erythema. No pallor.  Psychiatric: He has a normal mood and affect. His behavior is normal. Judgment and thought content normal.          Assessment & Plan:  Well exam. Get fasting labs. Refer to Hand Surgery for a possible trigger thumb.

## 2014-08-09 NOTE — Progress Notes (Signed)
Pre visit review using our clinic review tool, if applicable. No additional management support is needed unless otherwise documented below in the visit note. 

## 2014-08-10 LAB — CBC WITH DIFFERENTIAL/PLATELET
BASOS ABS: 0 10*3/uL (ref 0.0–0.1)
Basophils Relative: 0.4 % (ref 0.0–3.0)
EOS ABS: 0.2 10*3/uL (ref 0.0–0.7)
Eosinophils Relative: 2.5 % (ref 0.0–5.0)
HCT: 41.6 % (ref 39.0–52.0)
HEMOGLOBIN: 13.8 g/dL (ref 13.0–17.0)
LYMPHS PCT: 22.4 % (ref 12.0–46.0)
Lymphs Abs: 1.4 10*3/uL (ref 0.7–4.0)
MCHC: 33.1 g/dL (ref 30.0–36.0)
MCV: 103 fl — ABNORMAL HIGH (ref 78.0–100.0)
Monocytes Absolute: 0.6 10*3/uL (ref 0.1–1.0)
Monocytes Relative: 10 % (ref 3.0–12.0)
NEUTROS PCT: 64.7 % (ref 43.0–77.0)
Neutro Abs: 4.1 10*3/uL (ref 1.4–7.7)
Platelets: 157 10*3/uL (ref 150.0–400.0)
RBC: 4.04 Mil/uL — ABNORMAL LOW (ref 4.22–5.81)
RDW: 13.3 % (ref 11.5–15.5)
WBC: 6.3 10*3/uL (ref 4.0–10.5)

## 2014-08-10 LAB — PSA: PSA: 1.1 ng/mL (ref 0.10–4.00)

## 2014-08-10 LAB — TSH: TSH: 1.77 u[IU]/mL (ref 0.35–4.50)

## 2014-08-11 LAB — LIPID PANEL
CHOL/HDL RATIO: 4
Cholesterol: 207 mg/dL — ABNORMAL HIGH (ref 0–200)
HDL: 49.8 mg/dL (ref >39.00)
LDL CALC: 144 mg/dL — AB (ref 0–99)
NonHDL: 157.2
TRIGLYCERIDES: 66 mg/dL (ref 0.0–149.0)
VLDL: 13.2 mg/dL (ref 0.0–40.0)

## 2014-08-11 LAB — HEPATIC FUNCTION PANEL
ALT: 15 U/L (ref 0–53)
AST: 24 U/L (ref 0–37)
Albumin: 4.4 g/dL (ref 3.5–5.2)
Alkaline Phosphatase: 68 U/L (ref 39–117)
BILIRUBIN TOTAL: 0.9 mg/dL (ref 0.2–1.2)
Bilirubin, Direct: 0.2 mg/dL (ref 0.0–0.3)
Total Protein: 6.6 g/dL (ref 6.0–8.3)

## 2014-08-11 LAB — BASIC METABOLIC PANEL
BUN: 14 mg/dL (ref 6–23)
CO2: 31 mEq/L (ref 19–32)
CREATININE: 0.8 mg/dL (ref 0.4–1.5)
Calcium: 9.8 mg/dL (ref 8.4–10.5)
Chloride: 105 mEq/L (ref 96–112)
GFR: 96.27 mL/min (ref >60.00)
GLUCOSE: 88 mg/dL (ref 70–99)
Potassium: 4.1 mEq/L (ref 3.5–5.1)
Sodium: 141 mEq/L (ref 135–145)

## 2014-08-11 LAB — MICROALBUMIN / CREATININE URINE RATIO
CREATININE, U: 107.2 mg/dL
MICROALB UR: 1.3 mg/dL (ref 0.0–1.9)
MICROALB/CREAT RATIO: 1.2 mg/g (ref 0.0–30.0)

## 2014-08-11 LAB — HEMOGLOBIN A1C: HEMOGLOBIN A1C: 6 % (ref 4.6–6.5)

## 2014-08-16 ENCOUNTER — Ambulatory Visit (INDEPENDENT_AMBULATORY_CARE_PROVIDER_SITE_OTHER): Payer: Medicare Other | Admitting: Family Medicine

## 2014-08-16 ENCOUNTER — Ambulatory Visit: Payer: Medicare Other

## 2014-08-16 VITALS — BP 165/89 | HR 70

## 2014-08-16 DIAGNOSIS — I4891 Unspecified atrial fibrillation: Secondary | ICD-10-CM

## 2014-08-16 LAB — POCT INR: INR: 1

## 2014-08-16 NOTE — Progress Notes (Signed)
Patient's wife advised

## 2014-08-23 ENCOUNTER — Ambulatory Visit (INDEPENDENT_AMBULATORY_CARE_PROVIDER_SITE_OTHER): Payer: Medicare Other | Admitting: Sports Medicine

## 2014-08-23 VITALS — BP 158/93 | HR 72

## 2014-08-23 DIAGNOSIS — I4891 Unspecified atrial fibrillation: Secondary | ICD-10-CM

## 2014-08-23 LAB — POCT INR: INR: 1.6

## 2014-08-23 NOTE — Progress Notes (Signed)
Left detailed message and for patient to return call.

## 2014-08-26 NOTE — Progress Notes (Signed)
Left message for patient to call back  

## 2014-08-27 NOTE — Progress Notes (Signed)
Patient advised and scheduled for follow up.  

## 2014-09-02 ENCOUNTER — Telehealth: Payer: Self-pay

## 2014-09-02 ENCOUNTER — Ambulatory Visit (INDEPENDENT_AMBULATORY_CARE_PROVIDER_SITE_OTHER): Payer: Medicare Other | Admitting: Family Medicine

## 2014-09-02 VITALS — BP 171/101 | HR 73

## 2014-09-02 DIAGNOSIS — I482 Chronic atrial fibrillation, unspecified: Secondary | ICD-10-CM

## 2014-09-02 LAB — POCT INR: INR: 3.1

## 2014-09-02 NOTE — Telephone Encounter (Signed)
FYI  Tanner Hensley was seen for his INR check in Dr Gardiner Ramus office. His blood pressure is elevated 171/101. I offered him an appointment to see Dr Madilyn Fireman; he declined. He would rather follow up with Dr Sarajane Jews for his elevated blood pressure.

## 2014-09-02 NOTE — Progress Notes (Signed)
Patient advised of recommendations.  

## 2014-09-02 NOTE — Telephone Encounter (Signed)
Called pt to get information about eye exam.   Pt stated that he went to [facility] at [place] They are requesting a records release for the eye exam.

## 2014-09-02 NOTE — Telephone Encounter (Signed)
noted 

## 2014-09-09 DIAGNOSIS — Z950 Presence of cardiac pacemaker: Secondary | ICD-10-CM | POA: Diagnosis not present

## 2014-09-10 NOTE — Telephone Encounter (Signed)
Spoke to [employer]. He is sending a message to physician to okay the release of records.

## 2014-09-17 ENCOUNTER — Ambulatory Visit (INDEPENDENT_AMBULATORY_CARE_PROVIDER_SITE_OTHER): Payer: Medicare Other | Admitting: Family Medicine

## 2014-09-17 VITALS — BP 165/98 | HR 67

## 2014-09-17 DIAGNOSIS — I4891 Unspecified atrial fibrillation: Secondary | ICD-10-CM

## 2014-09-17 LAB — POCT INR: INR: 3.9

## 2014-09-17 NOTE — Progress Notes (Signed)
Tanner Hensley was notifed and told to change his medication to 5mg  every day and follow up for recheck in 2 weeks. He verbalized understanding.

## 2014-10-10 ENCOUNTER — Ambulatory Visit (INDEPENDENT_AMBULATORY_CARE_PROVIDER_SITE_OTHER): Payer: Medicare Other | Admitting: Family Medicine

## 2014-10-10 DIAGNOSIS — I4891 Unspecified atrial fibrillation: Secondary | ICD-10-CM | POA: Diagnosis not present

## 2014-10-10 LAB — POCT INR: INR: 2.9

## 2014-10-10 NOTE — Progress Notes (Signed)
Pt.notified

## 2014-10-10 NOTE — Patient Instructions (Signed)
Tanner Hensley, Will you please let patient know that INR is at goal.  Continue 5mg  of coumadin daily and if INR is again at goal in one week rechecks can be spaced out further.

## 2014-10-10 NOTE — Progress Notes (Signed)
Tanner Hensley, Will you please let patient know that INR is at goal.  Continue 5mg  of coumadin daily and if INR is again at goal in one week rechecks can be spaced out further.

## 2014-10-29 ENCOUNTER — Encounter: Payer: Self-pay | Admitting: Family Medicine

## 2014-10-29 ENCOUNTER — Ambulatory Visit (INDEPENDENT_AMBULATORY_CARE_PROVIDER_SITE_OTHER): Payer: Medicare Other | Admitting: Family Medicine

## 2014-10-29 VITALS — BP 160/96 | HR 71 | Temp 98.3°F | Ht 65.75 in | Wt 160.0 lb

## 2014-10-29 DIAGNOSIS — B349 Viral infection, unspecified: Secondary | ICD-10-CM | POA: Diagnosis not present

## 2014-10-29 MED ORDER — TRAMADOL HCL 50 MG PO TABS: 50.0000 mg | ORAL_TABLET | Freq: Four times a day (QID) | ORAL | Status: DC | PRN

## 2014-10-29 NOTE — Progress Notes (Signed)
Pre visit review using our clinic review tool, if applicable. No additional management support is needed unless otherwise documented below in the visit note. 

## 2014-10-29 NOTE — Progress Notes (Signed)
   Subjective:    Patient ID: Tanner Hensley, male    DOB: June 24, 1941, 74 y.o.   MRN: [id number]  HPI Here with his wife for one week of HAs, fatigue, nausea without vomiting, and light diarrhea. No cough or ST.    Review of Systems  Constitutional: Positive for fatigue. Negative for fever.  HENT: Negative.   Eyes: Negative.   Respiratory: Negative.   Cardiovascular: Negative.   Gastrointestinal: Positive for nausea and diarrhea. Negative for vomiting, abdominal pain, constipation, blood in stool, abdominal distention and anal bleeding.       Objective:   Physical Exam  Constitutional: He appears well-developed and well-nourished.  HENT:  Right Ear: External ear normal.  Left Ear: External ear normal.  Nose: Nose normal.  Mouth/Throat: Oropharynx is clear and moist.  Eyes: Conjunctivae are normal.  Pulmonary/Chest: Effort normal and breath sounds normal.  Abdominal: Soft. Bowel sounds are normal. He exhibits no distension and no mass. There is no tenderness. There is no rebound and no guarding.  Lymphadenopathy:    He has no cervical adenopathy.          Assessment & Plan:  Rest, drink fluids, use tramadol prn. This should resolve soon

## 2014-11-12 ENCOUNTER — Ambulatory Visit: Payer: Medicare Other

## 2014-11-14 ENCOUNTER — Ambulatory Visit (INDEPENDENT_AMBULATORY_CARE_PROVIDER_SITE_OTHER): Payer: Medicare Other | Admitting: Family Medicine

## 2014-11-14 ENCOUNTER — Encounter: Payer: Self-pay | Admitting: Neurology

## 2014-11-14 ENCOUNTER — Ambulatory Visit (INDEPENDENT_AMBULATORY_CARE_PROVIDER_SITE_OTHER): Payer: Medicare Other | Admitting: Neurology

## 2014-11-14 VITALS — BP 164/94 | HR 70 | Resp 16 | Wt 161.0 lb

## 2014-11-14 VITALS — BP 150/100 | HR 80 | Resp 16 | Ht 65.0 in | Wt 161.0 lb

## 2014-11-14 DIAGNOSIS — I482 Chronic atrial fibrillation, unspecified: Secondary | ICD-10-CM

## 2014-11-14 DIAGNOSIS — G3184 Mild cognitive impairment, so stated: Secondary | ICD-10-CM

## 2014-11-14 LAB — POCT INR: INR: 3.3

## 2014-11-14 MED ORDER — DONEPEZIL HCL 10 MG PO TABS: ORAL_TABLET | ORAL | Status: DC

## 2014-11-14 NOTE — Progress Notes (Signed)
NEUROLOGY FOLLOW UP OFFICE NOTE  Tanner Hensley [id number]  HISTORY OF PRESENT ILLNESS: I had the pleasure of seeing Tanner Hensley in follow-up in the neurology clinic on 11/14/2014. He is again accompanied by his wife who helps supplement the history. 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 MOCA score at that time was normal 28/30.   Records and images were personally reviewed where available.  I personally reviewed head CT which did not show any acute changes, there was moderate subcortical white matter disease. Since his last visit, he feels his memory is getting worse. He continues to have word-finding difficulties. His wife has taken over bills in the past 4 months. He has to be reminded to take his medications, because in January he forgot to put his Coumadin in his pillbox and had problems with his INR. His wife now makes sure he puts them all in the box. He gets frustrated very easily due to the memory changes, at one point he got a call about their bank card and got flustered, he started screaming in frustration, which is unusual for him. He has burned some bagels, and gets distracted easily. He denies getting lost driving.  He continues to monitor his atrial fibrillation, and have been discussing potential ablation and concern for risks. He does note easy fatigability, short activities wear him out. He denies any chest pain. He has fallen down steps a couple of times, no head injuries.   HPI: This is a very pleasant 74 yo RH man with a history of hypertension, hyperlipidemia, and atrial fibrillation s/p ablation on chronic anticoagulation and pacemaker placement, who presented with worsening memory since May 2015. His wife reports that when he is in atrial fibrillation, he has more problems remembering things, however since May despite return to sinus rhythm, his memory has not improved. He asks the same questions  repeatedly, asking his wife 8-10 times a day what day it is. He has a routine where he makes coffee in the morning then walks the dog. In June 2015, he apparently had set the pot beside the coffee maker and when he returned from outside, grounds and coffee were all spilled on the table and floor. He would confuse dates. He went to a bible study, then could not recall if he had put his bible in his car. He forgets what he was going to get in the pantry, or when he puts his glasses, looking for it for 45 minutes. He has burned toast several times. He has noticed that it takes longer for him to process what his wife is saying, and by the time he gets it, she is on to another topic. He has to write things down to remember them. There is no family history of memory problems.  PAST MEDICAL HISTORY: Past Medical History  Diagnosis Date  . High cholesterol   . Insomnia   . Urticaria   . GERD (gastroesophageal reflux disease)   . Hypertension   . Atrial fibrillation     (Dr. Phylliss Blakes at [facility] and Dr. Lovena Le at [facility]. [facility] (Dr. Charlestine Night)  . Diabetes mellitus type II   . Arthritis     sees Dr. Charlestine Night  . Gout   . Polymyalgia rheumatica     sees Dr. Charlestine Night     MEDICATIONS: Current Outpatient Prescriptions on File Prior to Visit  Medication Sig Dispense Refill  . alendronate (FOSAMAX)  70 MG tablet TAKE 1 TABLET BY MOUTH EVERY 7 DAYS. TAKE WITH FULL GLASS OF WATER 12 tablet 3  . Ascorbic Acid 500 MG CAPS Take by mouth daily as needed.     Marland Kitchen aspirin 81 MG tablet Take 81 mg by mouth daily.      . Calcium Carbonate-Vitamin D (CALCIUM + D PO) Take by mouth 2 (two) times daily.      Marland Kitchen lisinopril (PRINIVIL,ZESTRIL) 10 MG tablet Take 1 tablet (10 mg total) by mouth daily. 90 tablet 3  . metroNIDAZOLE (METROGEL) 0.75 % gel Apply 1 application topically as needed.    . Multiple Vitamin (MULTIVITAMIN) tablet Take 1 tablet by mouth daily.        . sotalol (BETAPACE) 160 MG tablet     . warfarin (COUMADIN) 1 MG tablet TAKE 1 TABLET (1 MG TOTAL) BY MOUTH AS DIRECTED. AS DIRECTED (Patient taking differently: Take on Tuesdays with 5 mg tablet) 90 tablet 0  . warfarin (COUMADIN) 5 MG tablet Take 1 tablet (5 mg total) by mouth daily. (Patient taking differently: 5 mg daily except for 6 mg on Tuesdays) 90 tablet 1   No current facility-administered medications on file prior to visit.    ALLERGIES: Allergies  Allergen Reactions  . Codeine   . Hydrocodone-Acetaminophen   . Lipitor [Atorvastatin]     Myalgias   . Tramadol Nausea And Vomiting  . Zocor [Simvastatin]     myalgias    FAMILY HISTORY: Family History  Problem Relation Age of Onset  . Coronary artery disease Other   . Hypertension Other   . Heart disease Other     SOCIAL HISTORY: History   Social History  . Marital Status: Married    Spouse Name: N/A  . Number of Children: N/A  . Years of Education: N/A   Occupational History  . Not on file.   Social History Main Topics  . Smoking status: Never Smoker   . Smokeless tobacco: Never Used  . Alcohol Use: No  . Drug Use: No  . Sexual Activity: Not on file   Other Topics Concern  . Not on file   Social History Narrative    REVIEW OF SYSTEMS: Constitutional: No fevers, chills, or sweats, + generalized fatigue, no change in appetite Eyes: No visual changes, double vision, eye pain Ear, nose and throat: No hearing loss, ear pain, nasal congestion, sore throat Cardiovascular: No chest pain, palpitations Respiratory:  No shortness of breath at rest or with exertion, wheezes GastrointestinaI: No nausea, vomiting, diarrhea, abdominal pain, fecal incontinence Genitourinary:  No dysuria, urinary retention or frequency Musculoskeletal:  No neck pain, back pain Integumentary: No rash, pruritus, skin lesions Neurological: as above Psychiatric: No depression, insomnia, anxiety Endocrine: No palpitations,  fatigue, diaphoresis, mood swings, change in appetite, change in weight, increased thirst Hematologic/Lymphatic:  No anemia, purpura, petechiae. Allergic/Immunologic: no itchy/runny eyes, nasal congestion, recent allergic reactions, rashes  PHYSICAL EXAM: Filed Vitals:   11/14/14 0828  BP: 150/100  Pulse: 80  Resp: 16   General: No acute distress Head:  Normocephalic/atraumatic Neck: supple, no paraspinal tenderness, full range of motion Heart:  Regular rate and rhythm Lungs:  Clear to auscultation bilaterally Back: No paraspinal tenderness Skin/Extremities: No rash, no edema Neurological Exam: alert and oriented to person, place, and time. No aphasia or dysarthria. Fund of knowledge is appropriate.  Recent and remote memory are intact.  Attention and concentration are normal.    Able to name objects and repeat phrases.  Montreal Cognitive Assessment  11/14/2014  Visuospatial/ Executive (0/5) 5  Naming (0/3) 3  Attention: Read list of digits (0/2) 2  Attention: Read list of letters (0/1) 1  Attention: Serial 7 subtraction starting at 100 (0/3) 3  Language: Repeat phrase (0/2) 2  Language : Fluency (0/1) 1  Abstraction (0/2) 2  Delayed Recall (0/5) 4  Orientation (0/6) 6  Total 29  Adjusted Score (based on education) 29   Cranial nerves: Pupils equal, round, reactive to light.  Fundoscopic exam unremarkable, no papilledema. Extraocular movements intact with no nystagmus. Visual fields full. Facial sensation intact. No facial asymmetry. Tongue, uvula, palate midline.  Motor: Bulk and tone normal, no cogwheeling, muscle strength 5/5 throughout with no pronator drift.  Sensation to light touch intact.  No extinction to double simultaneous stimulation.  Deep tendon reflexes 2+ throughout except for +1 ankle jerks bilaterally, toes downgoing.  Finger to nose testing intact.  Gait narrow-based and steady, able to tandem walk adequately.  Romberg negative.  IMPRESSION: This is a 74 yo RH  man with vascular risk factors including hypertension, hyperlipidemia, and atrial fibrillation s/p ablation, pacemaker placement, on chronic anticoagulation, who presented with worsening memory since May 2015. His MOCA score in June 2015 was 28/30, today it is again normal at 29/30. 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 We had discussed the option of starting Aricept on his last visit, he would like to try this today. Side effects and expectations from the medication were discussed, he will start low dose 5mg  daily for a week, then increase to 10mg  daily. We again discussed the importance of control of vascular risk factors, physical exercise, and brain stimulation exercises for brain health. He will follow-up in 3 months.   Thank you for allowing me to participate in his care.  Please do not hesitate to call for any questions or concerns.  The duration of this appointment visit was 25 minutes of face-to-face time with the patient.  Greater than 50% of this time was spent in counseling, explanation of diagnosis, planning of further management, and coordination of care.   Ellouise Newer, M.D.   CC: Dr. Sarajane Jews

## 2014-11-14 NOTE — Patient Instructions (Signed)
1. Start Aricept 10mg : Take 1/2 tablet daily for 1 week, then increase to 1 tablet daily 2. Physical exercise and brain stimulation exercises are important for brain health 3. Follow-up in 3 months, call for any problems

## 2014-11-16 NOTE — Progress Notes (Signed)
Tried calling patient. Phone line busy multiple attempts.

## 2014-11-16 NOTE — Progress Notes (Signed)
Patient advised of recommendations.  

## 2014-11-18 ENCOUNTER — Encounter: Payer: Self-pay | Admitting: Neurology

## 2014-11-27 ENCOUNTER — Telehealth: Payer: Self-pay | Admitting: Family Medicine

## 2014-11-27 NOTE — Telephone Encounter (Signed)
[facility] appointment sounds good, thanks

## 2014-11-27 NOTE — Telephone Encounter (Signed)
Pt has severe headache they think due to the bp issues pt has been having,  No appt here at [place]  Scheduled pt at [facility]. Pt wanted you to know in case you will work in tomorrow. pls advise if this appt is ok? Thank you!!

## 2014-11-28 ENCOUNTER — Ambulatory Visit (INDEPENDENT_AMBULATORY_CARE_PROVIDER_SITE_OTHER): Payer: Medicare Other | Admitting: Family Medicine

## 2014-11-28 ENCOUNTER — Encounter: Payer: Self-pay | Admitting: Family Medicine

## 2014-11-28 VITALS — BP 170/100 | HR 73 | Temp 98.4°F | Ht 65.75 in | Wt 160.0 lb

## 2014-11-28 DIAGNOSIS — R51 Headache: Secondary | ICD-10-CM | POA: Diagnosis not present

## 2014-11-28 DIAGNOSIS — I10 Essential (primary) hypertension: Secondary | ICD-10-CM | POA: Diagnosis not present

## 2014-11-28 DIAGNOSIS — R519 Headache, unspecified: Secondary | ICD-10-CM

## 2014-11-28 MED ORDER — LISINOPRIL 20 MG PO TABS: 20.0000 mg | ORAL_TABLET | Freq: Every day | ORAL | Status: DC

## 2014-11-28 NOTE — Progress Notes (Signed)
OFFICE NOTE  11/28/2014  CC:  Chief Complaint  Patient presents with  . Headache     HPI: Patient is a 74 y.o. Caucasian male who is here accompanied by his wife for uncontrolled hypertension, having about a month hx of HA's all over head of throbbing type several days per week.  Ringing in ears, "pressure like a vice".  No focal weakness, no visual sx's. Tylenol does help the headache.  Sleep helps.  No pain in other parts of his body lately that may precipitate a rise in BP.  No OTC cold meds with decongestants.  Never has been much of a caffeine man. No meds started prior to HAs and worsening bp. Started aricept last week.   He has been compliant with 10mg  lisinopril which he has been on for approx 10 yrs per his report.  Pertinent PMH:  PMH and PSH were reviewed today.  MEDS:  Outpatient Prescriptions Prior to Visit  Medication Sig Dispense Refill  . alendronate (FOSAMAX) 70 MG tablet TAKE 1 TABLET BY MOUTH EVERY 7 DAYS. TAKE WITH FULL GLASS OF WATER 12 tablet 3  . aspirin 81 MG tablet Take 81 mg by mouth daily.      . Calcium Carbonate-Vitamin D (CALCIUM + D PO) Take by mouth 2 (two) times daily.      Marland Kitchen donepezil (ARICEPT) 10 MG tablet Take 1/2 tablet daily for 1 week, then increase to 1 tablet daily (Patient taking differently: Take 10 mg by mouth daily. ) 30 tablet 4  . lisinopril (PRINIVIL,ZESTRIL) 10 MG tablet Take 1 tablet (10 mg total) by mouth daily. 90 tablet 3  . metroNIDAZOLE (METROGEL) 0.75 % gel Apply 1 application topically as needed.    . Multiple Vitamin (MULTIVITAMIN) tablet Take 1 tablet by mouth daily.      . sotalol (BETAPACE) 160 MG tablet Take 120 mg by mouth daily.     Marland Kitchen warfarin (COUMADIN) 1 MG tablet TAKE 1 TABLET (1 MG TOTAL) BY MOUTH AS DIRECTED. AS DIRECTED (Patient taking differently: Take on Tuesdays with 5 mg tablet) 90 tablet 0  . warfarin (COUMADIN) 5 MG tablet Take 1 tablet (5 mg total) by mouth daily. (Patient taking differently: 5 mg daily  except for 6 mg on Tuesdays) 90 tablet 1  . Ascorbic Acid 500 MG CAPS Take by mouth daily as needed.      No facility-administered medications prior to visit.    PE: Blood pressure 170/100, pulse 73, temperature 98.4 F (36.9 C), temperature source Oral, height 5' 5.75" (1.67 m), weight 160 lb (72.576 kg), SpO2 98 %. Gen: Alert, well appearing.  Patient is oriented to person, place, time, and situation. CV: RRR (rate 70 by me), no m/r/g.   LUNGS: CTA bilat, nonlabored resps, good aeration in all lung fields. EXT: no clubbing, cyanosis, or edema.  Neuro: CN 2-12 intact bilaterally, strength 5/5 in proximal and distal upper extremities and lower extremities bilaterally.    No tremor.  No disdiadochokinesis.  No ataxia.    LABS: none today Recent:    Chemistry      Component Value Date/Time   NA 141 08/09/2014 1027   K 4.1 08/09/2014 1027   CL 105 08/09/2014 1027   CO2 31 08/09/2014 1027   BUN 14 08/09/2014 1027   CREATININE 0.8 08/09/2014 1027      Component Value Date/Time   CALCIUM 9.8 08/09/2014 1027   ALKPHOS 68 08/09/2014 1027   AST 24 08/09/2014 1027   ALT 15  08/09/2014 1027   BILITOT 0.9 08/09/2014 1027       IMPRESSION AND PLAN:  1) Uncontrolled hypertension; increase lisinopril to 20mg  qd. Buy bp cuff for home monitoring, check bp once a day and bring numbers to f/u with Dr. Sarajane Jews (or with me if Dr. Sarajane Jews is unavailable).  Goal <140/90 for now, and I told pt after this goal is attained he and Dr. Sarajane Jews can discuss how close to "perfect" bp they want to shoot for.    2) HA's of late, suspect due to uncontrolled HTN (+stress, per wife: he worries about his a fib a lot).  An After Visit Summary was printed and given to the patient.  FOLLOW UP: 2 wks

## 2014-11-28 NOTE — Progress Notes (Signed)
Pre visit review using our clinic review tool, if applicable. No additional management support is needed unless otherwise documented below in the visit note. 

## 2014-12-03 ENCOUNTER — Telehealth: Payer: Self-pay | Admitting: Neurology

## 2014-12-03 NOTE — Telephone Encounter (Signed)
Patient's wife called our office & said that patient has watery diarrhea & is not doing well. His wife thinks he should go to the ER but patient is refusing. She said he will agree to go if Dr. Anitra Lauth tells him to. Please call [phone]

## 2014-12-03 NOTE — Telephone Encounter (Signed)
I spoke with patient's wife. She is concerned that the patient's recent sxs are coming from the Aricept. She states that he did well with the half of tablet & even did well with the 1st week on the whole tablet. She states that he had 6-8 episodes of watery diarrhea in the middle of the night last night. I did explain to her that the gi upset/diarrhea are common side effects with the Aricept but the other sxs of weakness & vomiting typically are not. I did advise for her to take him to the ED for fluids due to him not being able to keep anything down and the continuing diarrhea which she states started on Saturday. I spoke with the patient as well & explained to him the importance of fluids given his sxs. He states he doesn't want to go to the ED he stated that they will call his pcp to see if they can get in to see him today if he can be seen today & his pcp suggests that he go to the ED for fluids then he will go at that time. I did tell them per Dr. Delice Lesch to stop the Aricept. I asked patient's wife to call me later with an update or how patient was doing.

## 2014-12-03 NOTE — Telephone Encounter (Signed)
Pt wife Lavonna Monarch called [phone] and states that pt is not doing well he is throwing up, using the bathroom, he has lost 7 pounds in a week, and blood pressure is going up. He has not been able to keep anything down. He had the flu. Pt wife is really worried about him and would like someone to call her

## 2014-12-03 NOTE — Telephone Encounter (Signed)
Please advise 

## 2014-12-03 NOTE — Telephone Encounter (Signed)
I recommend that patient go to the ED ASAP.-thx

## 2014-12-03 NOTE — Telephone Encounter (Signed)
Pt's wife aware to take pt to ED now.

## 2014-12-05 ENCOUNTER — Other Ambulatory Visit: Payer: Self-pay | Admitting: Family Medicine

## 2014-12-09 DIAGNOSIS — I10 Essential (primary) hypertension: Secondary | ICD-10-CM | POA: Diagnosis not present

## 2014-12-09 DIAGNOSIS — I481 Persistent atrial fibrillation: Secondary | ICD-10-CM | POA: Diagnosis not present

## 2014-12-09 DIAGNOSIS — I2510 Atherosclerotic heart disease of native coronary artery without angina pectoris: Secondary | ICD-10-CM | POA: Diagnosis not present

## 2014-12-09 DIAGNOSIS — I495 Sick sinus syndrome: Secondary | ICD-10-CM | POA: Diagnosis not present

## 2014-12-10 DIAGNOSIS — Z79899 Other long term (current) drug therapy: Secondary | ICD-10-CM | POA: Diagnosis not present

## 2014-12-10 DIAGNOSIS — I480 Paroxysmal atrial fibrillation: Secondary | ICD-10-CM | POA: Diagnosis not present

## 2014-12-10 DIAGNOSIS — I498 Other specified cardiac arrhythmias: Secondary | ICD-10-CM | POA: Diagnosis not present

## 2014-12-10 DIAGNOSIS — I4891 Unspecified atrial fibrillation: Secondary | ICD-10-CM | POA: Diagnosis not present

## 2014-12-10 DIAGNOSIS — I2510 Atherosclerotic heart disease of native coronary artery without angina pectoris: Secondary | ICD-10-CM | POA: Diagnosis not present

## 2014-12-10 DIAGNOSIS — I481 Persistent atrial fibrillation: Secondary | ICD-10-CM | POA: Diagnosis not present

## 2014-12-10 DIAGNOSIS — E119 Type 2 diabetes mellitus without complications: Secondary | ICD-10-CM | POA: Diagnosis not present

## 2014-12-10 DIAGNOSIS — I10 Essential (primary) hypertension: Secondary | ICD-10-CM | POA: Diagnosis not present

## 2014-12-10 DIAGNOSIS — R0602 Shortness of breath: Secondary | ICD-10-CM | POA: Diagnosis not present

## 2014-12-12 ENCOUNTER — Encounter: Payer: Self-pay | Admitting: Family Medicine

## 2014-12-12 ENCOUNTER — Ambulatory Visit (INDEPENDENT_AMBULATORY_CARE_PROVIDER_SITE_OTHER): Payer: Medicare Other | Admitting: Family Medicine

## 2014-12-12 VITALS — BP 117/67 | HR 72 | Temp 98.1°F | Ht 65.75 in | Wt 156.0 lb

## 2014-12-12 DIAGNOSIS — I10 Essential (primary) hypertension: Secondary | ICD-10-CM

## 2014-12-12 DIAGNOSIS — I4891 Unspecified atrial fibrillation: Secondary | ICD-10-CM

## 2014-12-12 DIAGNOSIS — R413 Other amnesia: Secondary | ICD-10-CM

## 2014-12-12 NOTE — Progress Notes (Signed)
   Subjective:    Patient ID: Tanner Hensley, male    DOB: Aug 04, 1941, 74 y.o.   MRN: [id number]  HPI Here to follow up on HTN and other issues. He has had an eventful month. First his BP had been steadily going up and he saw Dr. Ernestine Conrad on 11-28-14 when it was 170/100. His Lisinopril was increased from 10 mg to 20 mg daily and his BP has been stable ever since. He had been having headaches but these have stopped. At the same time he was started on Aricept by Dr. Delice Lesch for cognitive impairment. He took 5 mg daily for one week and then increased this to 10 mg daily. After several weeks of this he developed nausea, vomiting, and diarrhea with no fever. It was determined that these were side effects of the Aricept so Dr. Delice Lesch advised him to stop it completely. He did so and within 24 hours his GI symptoms resolved. He is due to follow up with Dr. Delice Lesch in about 2 months. Also several days ago he underwent another cardioversion for his atrial fibrillation and this was successful. He has been in steady rhythm since then.    Review of Systems  Constitutional: Negative.   Respiratory: Negative.   Cardiovascular: Negative.   Gastrointestinal: Negative.   Neurological: Negative.        Objective:   Physical Exam  Constitutional: He appears well-developed and well-nourished. No distress.  Cardiovascular: Normal rate, regular rhythm, normal heart sounds and intact distal pulses.   Pulmonary/Chest: Effort normal and breath sounds normal.  Abdominal: Soft. Bowel sounds are normal. He exhibits no distension and no mass. There is no tenderness. There is no rebound and no guarding.          Assessment & Plan:  His GI effects from the Aricept have resolved. When he sees Dr. Delice Lesch again they can discuss possibly trying low dose Aricept again or else trying Namenda. His HTN is stable and he is in sinus rhythm. Recheck prn

## 2014-12-12 NOTE — Progress Notes (Signed)
Pre visit review using our clinic review tool, if applicable. No additional management support is needed unless otherwise documented below in the visit note. 

## 2015-01-14 DIAGNOSIS — I2510 Atherosclerotic heart disease of native coronary artery without angina pectoris: Secondary | ICD-10-CM | POA: Diagnosis not present

## 2015-01-14 DIAGNOSIS — I4891 Unspecified atrial fibrillation: Secondary | ICD-10-CM | POA: Diagnosis not present

## 2015-01-14 DIAGNOSIS — I440 Atrioventricular block, first degree: Secondary | ICD-10-CM | POA: Diagnosis not present

## 2015-02-05 DIAGNOSIS — I498 Other specified cardiac arrhythmias: Secondary | ICD-10-CM | POA: Diagnosis not present

## 2015-02-05 DIAGNOSIS — Z45010 Encounter for checking and testing of cardiac pacemaker pulse generator [battery]: Secondary | ICD-10-CM | POA: Diagnosis not present

## 2015-02-14 ENCOUNTER — Ambulatory Visit: Payer: Medicare Other | Admitting: Neurology

## 2015-02-24 ENCOUNTER — Ambulatory Visit (INDEPENDENT_AMBULATORY_CARE_PROVIDER_SITE_OTHER): Payer: Medicare Other | Admitting: Family Medicine

## 2015-02-24 VITALS — BP 172/104 | HR 83

## 2015-02-24 DIAGNOSIS — I4891 Unspecified atrial fibrillation: Secondary | ICD-10-CM

## 2015-02-24 LAB — POCT INR: INR: 1.9

## 2015-02-24 MED ORDER — WARFARIN SODIUM 5 MG PO TABS: ORAL_TABLET | ORAL | Status: DC

## 2015-02-24 NOTE — Progress Notes (Signed)
   Subjective:    Patient ID: Tanner Hensley, male    DOB: [birth year], 74 y.o.   MRN: [id number]  HPI Blood pressure quite elevated today. Patient reports that he did take his blood pressure pill, lisinopril and sotalol around 7 AM this morning. He came in today around 9 AM. We will fax this information over to Dr. Alysia Penna patient says that he will call to make an appointment to get in with Dr. Sarajane Jews.  Beatrice Lecher, MD    Review of Systems     Objective:   Physical Exam        Assessment & Plan:

## 2015-02-24 NOTE — Progress Notes (Signed)
Advised of dosage change, verbalized understanding. Pt states he has not yet called Dr. Sarajane Jews for f/u on his BP but will call today. Pt will call clinic to set up 2 week nurse visit. No further questions.

## 2015-02-25 ENCOUNTER — Encounter: Payer: Self-pay | Admitting: Family Medicine

## 2015-02-25 ENCOUNTER — Ambulatory Visit (INDEPENDENT_AMBULATORY_CARE_PROVIDER_SITE_OTHER): Payer: Medicare Other | Admitting: Family Medicine

## 2015-02-25 VITALS — BP 150/88 | HR 68 | Temp 99.3°F | Wt 155.7 lb

## 2015-02-25 DIAGNOSIS — I482 Chronic atrial fibrillation, unspecified: Secondary | ICD-10-CM

## 2015-02-25 DIAGNOSIS — I10 Essential (primary) hypertension: Secondary | ICD-10-CM

## 2015-02-25 MED ORDER — METOPROLOL SUCCINATE ER 25 MG PO TB24: 25.0000 mg | ORAL_TABLET | Freq: Every day | ORAL | Status: DC

## 2015-02-25 MED ORDER — LISINOPRIL 40 MG PO TABS: 40.0000 mg | ORAL_TABLET | Freq: Every day | ORAL | Status: DC

## 2015-02-25 NOTE — Progress Notes (Signed)
Pre visit review using our clinic review tool, if applicable. No additional management support is needed unless otherwise documented below in the visit note. 

## 2015-02-26 ENCOUNTER — Encounter: Payer: Self-pay | Admitting: Family Medicine

## 2015-02-26 NOTE — Progress Notes (Signed)
   Subjective:    Patient ID: Tanner Hensley, male    DOB: 1941-08-27, 74 y.o.   MRN: [id number]  HPI Here with concerns about high BP readings. At home he has been getting readings as high as 172/104, averaging from 343 to 735 systolic. He has felt fine, no HA or chest pain or SOB. He as chronic atrial fibrillation and is due to see his cardiologist again in about 3 weeks. He is on Sotalol and Lisinopril 20 mg daily. He had normal labs which demonstrated normal renal function in January.    Review of Systems  Constitutional: Negative.   HENT: Negative.   Eyes: Negative.   Respiratory: Negative.   Cardiovascular: Negative.   Neurological: Negative.        Objective:   Physical Exam  Constitutional: He is oriented to person, place, and time. He appears well-developed and well-nourished.  Neck: No thyromegaly present.  Cardiovascular: Normal rate, normal heart sounds and intact distal pulses.   Irregular rhythm   Pulmonary/Chest: Effort normal.  Musculoskeletal: He exhibits no edema.  Lymphadenopathy:    He has no cervical adenopathy.  Neurological: He is alert and oriented to person, place, and time.          Assessment & Plan:  His BP has been going up so we will increase the Lisinopril to 40 mg daily. He will see his cardiologist to recheck this in 3 weeks

## 2015-03-26 DIAGNOSIS — I442 Atrioventricular block, complete: Secondary | ICD-10-CM | POA: Diagnosis not present

## 2015-03-28 ENCOUNTER — Other Ambulatory Visit: Payer: Self-pay

## 2015-03-28 MED ORDER — LISINOPRIL 40 MG PO TABS: 40.0000 mg | ORAL_TABLET | Freq: Every day | ORAL | Status: DC

## 2015-03-28 NOTE — Telephone Encounter (Signed)
Rx request Lisnopril 40 mg tablet- Take 1 tablet by mouth daily #90 Rx sent to pharmacy.

## 2015-04-16 ENCOUNTER — Ambulatory Visit (INDEPENDENT_AMBULATORY_CARE_PROVIDER_SITE_OTHER): Payer: Medicare Other | Admitting: Family Medicine

## 2015-04-16 VITALS — BP 164/100 | HR 88

## 2015-04-16 DIAGNOSIS — I482 Chronic atrial fibrillation, unspecified: Secondary | ICD-10-CM

## 2015-04-16 LAB — POCT INR: INR: 3.5

## 2015-04-16 NOTE — Progress Notes (Signed)
Pt advised of dosage change, will call to schedule his f/u.

## 2015-04-28 ENCOUNTER — Ambulatory Visit (INDEPENDENT_AMBULATORY_CARE_PROVIDER_SITE_OTHER): Payer: Medicare Other | Admitting: Family Medicine

## 2015-04-28 ENCOUNTER — Encounter: Payer: Self-pay | Admitting: Family Medicine

## 2015-04-28 VITALS — BP 118/75 | HR 91 | Temp 99.1°F | Ht 65.75 in | Wt 161.0 lb

## 2015-04-28 DIAGNOSIS — I10 Essential (primary) hypertension: Secondary | ICD-10-CM | POA: Diagnosis not present

## 2015-04-28 DIAGNOSIS — I482 Chronic atrial fibrillation, unspecified: Secondary | ICD-10-CM

## 2015-04-28 DIAGNOSIS — Z23 Encounter for immunization: Secondary | ICD-10-CM | POA: Diagnosis not present

## 2015-04-28 NOTE — Progress Notes (Signed)
   Subjective:    Patient ID: Tanner Hensley, male    DOB: Dec 21, 1940, 74 y.o.   MRN: [id number]  HPI Here to recheck his HTN. When we saw him 2 months ago we increased the Lisinopril to 40 mg daily. This has worked well for him. The BP is normal and he feels fine. He saw his cardiology team a few weeks ago, and they seemed fine with this as well.    Review of Systems  Constitutional: Negative.   Respiratory: Negative.   Cardiovascular: Negative.   Neurological: Negative.        Objective:   Physical Exam  Constitutional: He appears well-developed and well-nourished.  Neck: No thyromegaly present.  Cardiovascular: Normal rate, normal heart sounds and intact distal pulses.   Irregular rhythm   Pulmonary/Chest: Effort normal and breath sounds normal.  Lymphadenopathy:    He has no cervical adenopathy.          Assessment & Plan:  His TN is well controlled and his atrial fibrillation is stable.

## 2015-04-28 NOTE — Progress Notes (Signed)
Pre visit review using our clinic review tool, if applicable. No additional management support is needed unless otherwise documented below in the visit note. 

## 2015-05-13 ENCOUNTER — Other Ambulatory Visit: Payer: Self-pay

## 2015-05-13 MED ORDER — ALENDRONATE SODIUM 70 MG PO TABS: ORAL_TABLET | ORAL | Status: DC

## 2015-05-14 DIAGNOSIS — Z45010 Encounter for checking and testing of cardiac pacemaker pulse generator [battery]: Secondary | ICD-10-CM | POA: Diagnosis not present

## 2015-05-14 DIAGNOSIS — I442 Atrioventricular block, complete: Secondary | ICD-10-CM | POA: Diagnosis not present

## 2015-06-03 ENCOUNTER — Ambulatory Visit (INDEPENDENT_AMBULATORY_CARE_PROVIDER_SITE_OTHER): Payer: Medicare Other | Admitting: Family Medicine

## 2015-06-03 VITALS — BP 165/103 | HR 83

## 2015-06-03 DIAGNOSIS — I4891 Unspecified atrial fibrillation: Secondary | ICD-10-CM

## 2015-06-03 LAB — POCT INR: INR: 3.5

## 2015-06-03 NOTE — Progress Notes (Signed)
Patient advised of recommendations.  

## 2015-06-17 ENCOUNTER — Ambulatory Visit (INDEPENDENT_AMBULATORY_CARE_PROVIDER_SITE_OTHER): Payer: Medicare Other | Admitting: Family Medicine

## 2015-06-17 VITALS — BP 155/96 | HR 98

## 2015-06-17 DIAGNOSIS — I4891 Unspecified atrial fibrillation: Secondary | ICD-10-CM | POA: Diagnosis not present

## 2015-06-17 LAB — POCT INR: INR: 3

## 2015-06-17 NOTE — Progress Notes (Signed)
Pt advised of dosage change and follow up appointment was made. No further questions.

## 2015-07-01 ENCOUNTER — Ambulatory Visit (INDEPENDENT_AMBULATORY_CARE_PROVIDER_SITE_OTHER): Payer: Medicare Other | Admitting: Family Medicine

## 2015-07-01 VITALS — BP 138/89 | HR 81

## 2015-07-01 DIAGNOSIS — I4891 Unspecified atrial fibrillation: Secondary | ICD-10-CM

## 2015-07-01 LAB — POCT INR: INR: 1.7

## 2015-07-01 NOTE — Progress Notes (Signed)
Patient advised of recommendations.  

## 2015-07-17 ENCOUNTER — Ambulatory Visit (INDEPENDENT_AMBULATORY_CARE_PROVIDER_SITE_OTHER): Payer: Medicare Other | Admitting: Family Medicine

## 2015-07-17 VITALS — BP 136/80 | HR 80 | Resp 16

## 2015-07-17 DIAGNOSIS — I4891 Unspecified atrial fibrillation: Secondary | ICD-10-CM

## 2015-07-17 LAB — POCT INR: INR: 2.1

## 2015-07-29 ENCOUNTER — Ambulatory Visit (INDEPENDENT_AMBULATORY_CARE_PROVIDER_SITE_OTHER): Payer: Medicare Other | Admitting: Family Medicine

## 2015-07-29 ENCOUNTER — Encounter: Payer: Self-pay | Admitting: Family Medicine

## 2015-07-29 VITALS — BP 109/72 | HR 92 | Temp 99.8°F | Ht 65.75 in | Wt 158.0 lb

## 2015-07-29 DIAGNOSIS — J209 Acute bronchitis, unspecified: Secondary | ICD-10-CM | POA: Diagnosis not present

## 2015-07-29 DIAGNOSIS — R509 Fever, unspecified: Secondary | ICD-10-CM

## 2015-07-29 LAB — POCT INFLUENZA A/B
INFLUENZA A, POC: NEGATIVE
INFLUENZA B, POC: NEGATIVE

## 2015-07-29 MED ORDER — ONDANSETRON HCL 8 MG PO TABS: 8.0000 mg | ORAL_TABLET | Freq: Three times a day (TID) | ORAL | Status: DC | PRN

## 2015-07-29 MED ORDER — BENZONATATE 200 MG PO CAPS: 200.0000 mg | ORAL_CAPSULE | Freq: Two times a day (BID) | ORAL | Status: DC | PRN

## 2015-07-29 MED ORDER — AZITHROMYCIN 250 MG PO TABS: ORAL_TABLET | ORAL | Status: DC

## 2015-07-29 NOTE — Progress Notes (Signed)
   Subjective:    Patient ID: Tanner Hensley, male    DOB: [birth year], 74 y.o.   MRN: [id number]  HPI Here for 3 days of fever, body aches, nausea and vomiting, diarrhea, HA, ST, and chest congestion with a dry cough. Drinking fluids but he has not had any food for 2 days.    Review of Systems  Constitutional: Positive for fever, chills and fatigue.  HENT: Positive for sore throat. Negative for congestion, ear pain, postnasal drip and sinus pressure.   Eyes: Negative.   Respiratory: Positive for cough and chest tightness.   Cardiovascular: Negative.   Genitourinary: Negative.   Neurological: Positive for headaches.       Objective:   Physical Exam  Constitutional: He appears well-developed and well-nourished.  Appears ill   HENT:  Right Ear: External ear normal.  Left Ear: External ear normal.  Nose: Nose normal.  Mouth/Throat: Oropharynx is clear and moist. No oropharyngeal exudate.  Eyes: Conjunctivae are normal.  Neck: Neck supple. No thyromegaly present.  Cardiovascular: Normal rate, regular rhythm, normal heart sounds and intact distal pulses.   Pulmonary/Chest: Effort normal. No respiratory distress. He has no wheezes. He has no rales.  Scattered rhonchi   Lymphadenopathy:    He has no cervical adenopathy.          Assessment & Plan:  Bronchitis on top of a viral illness (he tested negative for influenza). Rest, drink fluids. Use Zofran for nausea and Benzonatate for cough. Given a Zpack. Recheck prn

## 2015-07-29 NOTE — Progress Notes (Signed)
Pre visit review using our clinic review tool, if applicable. No additional management support is needed unless otherwise documented below in the visit note. 

## 2015-07-31 ENCOUNTER — Other Ambulatory Visit: Payer: Self-pay | Admitting: Family Medicine

## 2015-08-05 ENCOUNTER — Other Ambulatory Visit: Payer: Self-pay | Admitting: Family Medicine

## 2015-08-06 ENCOUNTER — Encounter: Payer: Self-pay | Admitting: Family Medicine

## 2015-08-06 ENCOUNTER — Ambulatory Visit (INDEPENDENT_AMBULATORY_CARE_PROVIDER_SITE_OTHER): Payer: Medicare Other | Admitting: Family Medicine

## 2015-08-06 VITALS — BP 127/81 | HR 80 | Temp 99.1°F | Ht 65.75 in | Wt 158.0 lb

## 2015-08-06 DIAGNOSIS — J209 Acute bronchitis, unspecified: Secondary | ICD-10-CM | POA: Diagnosis not present

## 2015-08-06 DIAGNOSIS — E119 Type 2 diabetes mellitus without complications: Secondary | ICD-10-CM

## 2015-08-06 DIAGNOSIS — I10 Essential (primary) hypertension: Secondary | ICD-10-CM | POA: Diagnosis not present

## 2015-08-06 DIAGNOSIS — Z0000 Encounter for general adult medical examination without abnormal findings: Secondary | ICD-10-CM

## 2015-08-06 LAB — POCT URINALYSIS DIPSTICK
Bilirubin, UA: NEGATIVE
Glucose, UA: NEGATIVE
Ketones, UA: NEGATIVE
Leukocytes, UA: NEGATIVE
NITRITE UA: NEGATIVE
PROTEIN UA: NEGATIVE
SPEC GRAV UA: 1.025
UROBILINOGEN UA: 0.2
pH, UA: 5.5

## 2015-08-06 LAB — CBC WITH DIFFERENTIAL/PLATELET
BASOS ABS: 0 10*3/uL (ref 0.0–0.1)
Basophils Relative: 0.6 % (ref 0.0–3.0)
EOS ABS: 0.3 10*3/uL (ref 0.0–0.7)
Eosinophils Relative: 4.9 % (ref 0.0–5.0)
HCT: 42.4 % (ref 39.0–52.0)
HEMOGLOBIN: 14.2 g/dL (ref 13.0–17.0)
LYMPHS ABS: 1.6 10*3/uL (ref 0.7–4.0)
Lymphocytes Relative: 24.2 % (ref 12.0–46.0)
MCHC: 33.6 g/dL (ref 30.0–36.0)
MCV: 102.5 fl — ABNORMAL HIGH (ref 78.0–100.0)
MONO ABS: 0.6 10*3/uL (ref 0.1–1.0)
Monocytes Relative: 8.6 % (ref 3.0–12.0)
Neutro Abs: 4 10*3/uL (ref 1.4–7.7)
Neutrophils Relative %: 61.7 % (ref 43.0–77.0)
Platelets: 191 10*3/uL (ref 150.0–400.0)
RBC: 4.14 Mil/uL — AB (ref 4.22–5.81)
RDW: 13 % (ref 11.5–15.5)
WBC: 6.5 10*3/uL (ref 4.0–10.5)

## 2015-08-06 LAB — BASIC METABOLIC PANEL
BUN: 16 mg/dL (ref 6–23)
CHLORIDE: 106 meq/L (ref 96–112)
CO2: 31 meq/L (ref 19–32)
Calcium: 9.8 mg/dL (ref 8.4–10.5)
Creatinine, Ser: 1.07 mg/dL (ref 0.40–1.50)
GFR: 71.62 mL/min (ref >60.00)
GLUCOSE: 87 mg/dL (ref 70–99)
POTASSIUM: 4.7 meq/L (ref 3.5–5.1)
SODIUM: 142 meq/L (ref 135–145)

## 2015-08-06 LAB — PSA: PSA: 1.02 ng/mL (ref 0.10–4.00)

## 2015-08-06 LAB — HEPATIC FUNCTION PANEL
ALK PHOS: 61 U/L (ref 39–117)
ALT: 17 U/L (ref 0–53)
AST: 24 U/L (ref 0–37)
Albumin: 4.1 g/dL (ref 3.5–5.2)
BILIRUBIN DIRECT: 0.1 mg/dL (ref 0.0–0.3)
BILIRUBIN TOTAL: 0.7 mg/dL (ref 0.2–1.2)
TOTAL PROTEIN: 6.9 g/dL (ref 6.0–8.3)

## 2015-08-06 LAB — TSH: TSH: 2.48 u[IU]/mL (ref 0.35–4.50)

## 2015-08-06 LAB — LIPID PANEL
Cholesterol: 191 mg/dL (ref 0–200)
HDL: 32.8 mg/dL — ABNORMAL LOW (ref >39.00)
LDL Cholesterol: 122 mg/dL — ABNORMAL HIGH (ref 0–99)
NONHDL: 157.97
Total CHOL/HDL Ratio: 6
Triglycerides: 179 mg/dL — ABNORMAL HIGH (ref 0.0–149.0)
VLDL: 35.8 mg/dL (ref 0.0–40.0)

## 2015-08-06 LAB — HEMOGLOBIN A1C: HEMOGLOBIN A1C: 5.8 % (ref 4.6–6.5)

## 2015-08-06 MED ORDER — LEVOFLOXACIN 500 MG PO TABS: 500.0000 mg | ORAL_TABLET | Freq: Every day | ORAL | Status: AC

## 2015-08-06 NOTE — Progress Notes (Signed)
Pre visit review using our clinic review tool, if applicable. No additional management support is needed unless otherwise documented below in the visit note. 

## 2015-08-06 NOTE — Progress Notes (Signed)
   Subjective:    Patient ID: Tanner Hensley, male    DOB: 10-30-1940, 74 y.o.   MRN: [id number]  HPI Here for 2 weeks of stuffy head, PND, chest tightness and coughing up yellow sputum. He was here a week ago and was given a Zpack, but this did not help.    Review of Systems  Constitutional: Positive for fever.  HENT: Positive for congestion and postnasal drip. Negative for ear pain, sinus pressure and sore throat.   Eyes: Negative.   Respiratory: Positive for cough and chest tightness.        Objective:   Physical Exam  Constitutional: He appears well-developed and well-nourished.  Coughing frequently   HENT:  Right Ear: External ear normal.  Left Ear: External ear normal.  Nose: Nose normal.  Mouth/Throat: Oropharynx is clear and moist.  Eyes: Conjunctivae are normal.  Neck: No thyromegaly present.  Pulmonary/Chest: Effort normal. No respiratory distress. He has no wheezes. He has no rales.  Scattered rhonchi   Lymphadenopathy:    He has no cervical adenopathy.          Assessment & Plan:  Partially treated bronchitis, treat with Levaquin.

## 2015-08-11 DIAGNOSIS — I498 Other specified cardiac arrhythmias: Secondary | ICD-10-CM | POA: Diagnosis not present

## 2015-08-11 DIAGNOSIS — Z45010 Encounter for checking and testing of cardiac pacemaker pulse generator [battery]: Secondary | ICD-10-CM | POA: Diagnosis not present

## 2015-09-04 ENCOUNTER — Telehealth: Payer: Self-pay | Admitting: Family Medicine

## 2015-09-04 MED ORDER — LEVOFLOXACIN 500 MG PO TABS: 500.0000 mg | ORAL_TABLET | Freq: Every day | ORAL | Status: DC

## 2015-09-04 NOTE — Telephone Encounter (Signed)
Review possible drug interaction with this particular drug, verify with provider before sending in this medication.

## 2015-09-04 NOTE — Telephone Encounter (Signed)
Wife call to ask for refill on the following medicine  LEVOFLOXACIN . She said pt has had a relasp Hotel manager; [place]

## 2015-09-04 NOTE — Telephone Encounter (Signed)
Call in Levaquin 500 mg daily for 10 days  

## 2015-09-04 NOTE — Telephone Encounter (Signed)
Per Dr. Elease Hashimoto its okay to send in the [place], I was processing the order when possible interaction screen was displayed, advise pt to recheck coumadin level next week due to being on 2nd round of antibiotics. I spoke with pt and sent in script, pt will schedule lab appointment for next week.

## 2015-09-09 ENCOUNTER — Ambulatory Visit (INDEPENDENT_AMBULATORY_CARE_PROVIDER_SITE_OTHER): Payer: Medicare Other | Admitting: Family Medicine

## 2015-09-09 VITALS — BP 139/89 | HR 80 | Ht 65.75 in | Wt 158.0 lb

## 2015-09-09 DIAGNOSIS — I4891 Unspecified atrial fibrillation: Secondary | ICD-10-CM | POA: Diagnosis not present

## 2015-09-09 LAB — POCT INR: INR: 3.9

## 2015-09-09 NOTE — Progress Notes (Signed)
Patient advised of recommendations.  

## 2015-09-18 ENCOUNTER — Encounter: Payer: Self-pay | Admitting: Family Medicine

## 2015-09-18 ENCOUNTER — Ambulatory Visit (INDEPENDENT_AMBULATORY_CARE_PROVIDER_SITE_OTHER): Payer: Medicare Other | Admitting: Family Medicine

## 2015-09-18 VITALS — BP 140/86 | HR 85 | Temp 99.1°F | Ht 65.71 in | Wt 158.0 lb

## 2015-09-18 DIAGNOSIS — M810 Age-related osteoporosis without current pathological fracture: Secondary | ICD-10-CM | POA: Diagnosis not present

## 2015-09-18 DIAGNOSIS — M25512 Pain in left shoulder: Secondary | ICD-10-CM

## 2015-09-18 DIAGNOSIS — M25511 Pain in right shoulder: Secondary | ICD-10-CM | POA: Diagnosis not present

## 2015-09-18 DIAGNOSIS — Z0001 Encounter for general adult medical examination with abnormal findings: Secondary | ICD-10-CM | POA: Diagnosis not present

## 2015-09-18 DIAGNOSIS — E785 Hyperlipidemia, unspecified: Secondary | ICD-10-CM | POA: Diagnosis not present

## 2015-09-18 DIAGNOSIS — Z0000 Encounter for general adult medical examination without abnormal findings: Secondary | ICD-10-CM

## 2015-09-18 MED ORDER — LISINOPRIL 40 MG PO TABS: ORAL_TABLET | ORAL | Status: DC

## 2015-09-18 MED ORDER — MEMANTINE HCL 10 MG PO TABS: 10.0000 mg | ORAL_TABLET | Freq: Two times a day (BID) | ORAL | Status: DC

## 2015-09-18 NOTE — Progress Notes (Signed)
   Subjective:    Patient ID: Tanner Hensley, male    DOB: [birth year], 75 y.o.   MRN: [id number]  HPI 75 yr old male for a cpx. He feels well in general but he does have chronic pain in both shoulders. He asks to see Dr. Brien Few about pain management for these areas. He has benefited from cortisone shots in the past. Also he and his wife ask about his memory loss. 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 He tried Aricept but could not tolerate this due to nausea. I suggested he try Namenda but he never did. He sees Cardiology regularly.    Review of Systems  Constitutional: Negative.   HENT: Negative.   Eyes: Negative.   Respiratory: Negative.   Cardiovascular: Negative.   Gastrointestinal: Negative.   Genitourinary: Negative.   Musculoskeletal: Negative.   Skin: Negative.   Neurological: Negative.   Psychiatric/Behavioral: Negative.        Objective:   Physical Exam  Constitutional: He is oriented to person, place, and time. He appears well-developed and well-nourished. No distress.  HENT:  Head: Normocephalic and atraumatic.  Right Ear: External ear normal.  Left Ear: External ear normal.  Nose: Nose normal.  Mouth/Throat: Oropharynx is clear and moist. No oropharyngeal exudate.  Eyes: Conjunctivae and EOM are normal. Pupils are equal, round, and reactive to light. Right eye exhibits no discharge. Left eye exhibits no discharge. No scleral icterus.  Neck: Neck supple. No JVD present. No tracheal deviation present. No thyromegaly present.  Cardiovascular: Normal rate, regular rhythm, normal heart sounds and intact distal pulses.  Exam reveals no gallop and no friction rub.   No murmur heard. EKG shows a paced ventricular rhythm   Pulmonary/Chest: Effort normal and breath sounds normal. No respiratory distress. He has no wheezes. He has no rales. He exhibits no tenderness.  Abdominal: Soft. Bowel sounds are normal. He  exhibits no distension and no mass. There is no tenderness. There is no rebound and no guarding.  Genitourinary: Rectum normal, prostate normal and penis normal. Guaiac negative stool. No penile tenderness.  Musculoskeletal: Normal range of motion. He exhibits no edema or tenderness.  Lymphadenopathy:    He has no cervical adenopathy.  Neurological: He is alert and oriented to person, place, and time. He has normal reflexes. No cranial nerve deficit. He exhibits normal muscle tone. Coordination normal.  Skin: Skin is warm and dry. No rash noted. He is not diaphoretic. No erythema. No pallor.  Psychiatric: He has a normal mood and affect. His behavior is normal. Judgment and thought content normal.          Assessment & Plan:  Well exam. We discussed diet and exercise. We will refer him to Dr. Brien Few for the shoulder pain. We will refer him to GI since he is past due for a colonoscopy. He is due for another DEXA. He will try Namenda 10 mg bid for the memory loss.

## 2015-09-18 NOTE — Progress Notes (Signed)
Pre visit review using our clinic review tool, if applicable. No additional management support is needed unless otherwise documented below in the visit note. 

## 2015-09-19 ENCOUNTER — Encounter: Payer: Self-pay | Admitting: Gastroenterology

## 2015-09-25 ENCOUNTER — Ambulatory Visit (INDEPENDENT_AMBULATORY_CARE_PROVIDER_SITE_OTHER): Payer: Medicare Other | Admitting: Family Medicine

## 2015-09-25 VITALS — BP 160/95 | HR 82 | Ht 65.0 in | Wt 158.0 lb

## 2015-09-25 DIAGNOSIS — I4891 Unspecified atrial fibrillation: Secondary | ICD-10-CM | POA: Diagnosis not present

## 2015-09-25 LAB — POCT INR: INR: 2.6

## 2015-09-25 NOTE — Progress Notes (Signed)
   Subjective:    Patient ID: Tanner Hensley, male    DOB: 12/18/1940, 75 y.o.   MRN: [id number]  HPI  Patient comes in for INR check. No other complaints  Review of Systems     Objective:   Physical Exam   BP 160/95 mmHg  Pulse 82  Ht 5\' 5"  (1.651 m)  Wt 158 lb (71.668 kg)  BMI 26.29 kg/m2      Assessment & Plan:   Blood pressure was high on both arm readings. 170/98 and 160/95.  Patient states he hasn't taken his medications yet this morning.  Will monitor at home. The INR was 2.6, within normal range.

## 2015-10-01 ENCOUNTER — Telehealth: Payer: Self-pay | Admitting: Family Medicine

## 2015-10-01 NOTE — Telephone Encounter (Signed)
Pt said [facility] was going to contact Dr Sarajane Jews about the following rx memantine (NAMENDA) 10 MG tablet pt said the rx cost 195.00 and he can not pay this amount.  Pt is asking if you call him back

## 2015-10-01 NOTE — Telephone Encounter (Signed)
I spoke with pt and he is going to check with insurance and find out another alternative to this medication. He will get this information and call us back.

## 2015-10-02 DIAGNOSIS — M7541 Impingement syndrome of right shoulder: Secondary | ICD-10-CM | POA: Diagnosis not present

## 2015-10-02 DIAGNOSIS — I482 Chronic atrial fibrillation: Secondary | ICD-10-CM | POA: Diagnosis not present

## 2015-10-02 DIAGNOSIS — I10 Essential (primary) hypertension: Secondary | ICD-10-CM | POA: Diagnosis not present

## 2015-10-02 DIAGNOSIS — M7542 Impingement syndrome of left shoulder: Secondary | ICD-10-CM | POA: Diagnosis not present

## 2015-10-24 ENCOUNTER — Telehealth: Payer: Self-pay | Admitting: Family Medicine

## 2015-10-24 MED ORDER — MEMANTINE HCL 10 MG PO TABS: 10.0000 mg | ORAL_TABLET | Freq: Two times a day (BID) | ORAL | Status: DC

## 2015-10-24 NOTE — Telephone Encounter (Signed)
Pt need new rx  memantine 10 mg #90 w/refills send to [facility]

## 2015-10-24 NOTE — Telephone Encounter (Signed)
Per Dr. Sarajane Jews resend script and put dispense generic formulation only. I sent script e-scribe and spoke with pt.

## 2015-10-30 ENCOUNTER — Ambulatory Visit (INDEPENDENT_AMBULATORY_CARE_PROVIDER_SITE_OTHER)
Admission: RE | Admit: 2015-10-30 | Discharge: 2015-10-30 | Disposition: A | Discharge status: Home / Self Care | Payer: Medicare Other | Source: Ambulatory Visit | Attending: Family Medicine | Admitting: Family Medicine

## 2015-10-30 DIAGNOSIS — M810 Age-related osteoporosis without current pathological fracture: Secondary | ICD-10-CM

## 2015-11-10 ENCOUNTER — Ambulatory Visit (INDEPENDENT_AMBULATORY_CARE_PROVIDER_SITE_OTHER): Payer: Medicare Other | Admitting: Gastroenterology

## 2015-11-10 ENCOUNTER — Encounter: Payer: Self-pay | Admitting: Gastroenterology

## 2015-11-10 VITALS — BP 130/82 | HR 76 | Ht 65.0 in | Wt 158.0 lb

## 2015-11-10 DIAGNOSIS — Z86010 Personal history of colonic polyps: Secondary | ICD-10-CM

## 2015-11-10 DIAGNOSIS — Z45010 Encounter for checking and testing of cardiac pacemaker pulse generator [battery]: Secondary | ICD-10-CM | POA: Diagnosis not present

## 2015-11-10 DIAGNOSIS — I498 Other specified cardiac arrhythmias: Secondary | ICD-10-CM | POA: Diagnosis not present

## 2015-11-10 MED ORDER — NA SULFATE-K SULFATE-MG SULF 17.5-3.13-1.6 GM/177ML PO SOLN: 1.0000 | Freq: Once | ORAL | Status: DC

## 2015-11-10 NOTE — Progress Notes (Signed)
Review of pertinent gastrointestinal problems: 1. Personal history of precancerous colon polyps:  Colonoscopy Dr. Ardis Hughs 06/2007 found single subCM TA, recommended recall at 5 years.   HPI: This is a   very pleasant 75 year old man    who was referred to me by Laurey Morale, MD  to evaluate  polyp surveillance colonoscopy .    Chief complaint is personal history of precancerous colon polyps, on chronic blood thinners  Generally good health.  Fatigues easily.  Pacemaker placed 2012.  He remains in Afib.  He has been on coumadin for about 20 years.  No GI symptoms.  No bleeding, no change in his bowels. No abdominal pains.  Review of systems: Pertinent positive and negative review of systems were noted in the above HPI section. Complete review of systems was performed and was otherwise normal.   Past Medical History  Diagnosis Date  . High cholesterol   . Insomnia   . Urticaria   . GERD (gastroesophageal reflux disease)   . Hypertension   . Atrial fibrillation ([place])     (Dr. Phylliss Blakes at [facility] and Dr. Lovena Le at [facility]. [facility] polymyalgia rheumatica (Dr. Charlestine Night)  . Diabetes mellitus type II   . Arthritis     sees Dr. Charlestine Night  . Gout   . Polymyalgia rheumatica ([facility])     sees Dr. Charlestine Night     Past Surgical History  Procedure Laterality Date  . Cardiac ablation x two at [facility]    . Colonoscopy  07/06/10    per Dr. Oretha Caprice, single poly, repeat in 5 yrs.   . Right inguinal herniorrhaphy  11/28/09     per Dr. Donnie Mesa  . Left knee arthroscopy  02/03/10    per Dr. Lindwood Qua    Current Outpatient Prescriptions  Medication Sig Dispense Refill  . alendronate (FOSAMAX) 70 MG tablet TAKE 1 TABLET BY MOUTH EVERY 7 DAYS. TAKE WITH FULL GLASS OF WATER 12 tablet 3  . Ascorbic Acid 500 MG CAPS Take by mouth daily as needed.     Marland Kitchen aspirin 81 MG tablet Take 81 mg by mouth daily.      . Calcium Carbonate-Vitamin D (CALCIUM + D PO) Take  by mouth 2 (two) times daily.      . Flaxseed, Linseed, (FLAXSEED OIL) 1000 MG CAPS Take 1,000 mg by mouth daily.    Marland Kitchen lisinopril (PRINIVIL,ZESTRIL) 40 MG tablet Take 1 tablet by mouth  daily 90 tablet 3  . memantine (NAMENDA) 10 MG tablet Take 1 tablet (10 mg total) by mouth 2 (two) times daily. 180 tablet 3  . metroNIDAZOLE (METROGEL) 0.75 % gel Apply 1 application topically as needed.    . Multiple Vitamin (MULTIVITAMIN) tablet Take 1 tablet by mouth daily.      . sotalol (BETAPACE) 160 MG tablet Take 160 mg by mouth 2 (two) times daily.     Marland Kitchen warfarin (COUMADIN) 5 MG tablet Take 1 tablet by mouth  daily (Patient taking differently: Take as directed) 90 tablet 0   No current facility-administered medications for this visit.    Allergies as of 11/10/2015 - Review Complete 11/10/2015  Allergen Reaction Noted  . Aricept [donepezil]  12/12/2014  . Codeine  05/22/2007  . Hydrocodone-acetaminophen  11/11/2009  . Lipitor [atorvastatin]  10/03/2012  . Tramadol Nausea And Vomiting 11/14/2014  . Zocor [simvastatin]  10/03/2012    Family History  Problem Relation Age of Onset  . Coronary artery disease Other   .  Hypertension Other   . Heart disease Other     Social History   Social History  . Marital Status: Married    Spouse Name: N/A  . Number of Children: N/A  . Years of Education: N/A   Occupational History  . Not on file.   Social History Main Topics  . Smoking status: Never Smoker   . Smokeless tobacco: Never Used  . Alcohol Use: No  . Drug Use: No  . Sexual Activity: Not on file   Other Topics Concern  . Not on file   Social History Narrative     Physical Exam: BP 130/82 mmHg  Pulse 76  Ht 5\' 5"  (1.651 m)  Wt 158 lb (71.668 kg)  BMI 26.29 kg/m2 Constitutional: generally well-appearing Psychiatric: alert and oriented x3 Eyes: extraocular movements intact Mouth: oral pharynx moist, no lesions Neck: supple no lymphadenopathy Cardiovascular: heart regular  rate and rhythm Lungs: clear to auscultation bilaterally Abdomen: soft, nontender, nondistended, no obvious ascites, no peritoneal signs, normal bowel sounds Extremities: no lower extremity edema bilaterally Skin: no lesions on visible extremities   Assessment and plan: 75 y.o. male with  personal history of precancerous colon polyps, on chronic blood thinner for atrial fibrillation  He understands that being on Coumadin for atrial fibrillation puts him at increased risk for complications during colonoscopy. We generally asked the patient stopped Coumadin for 5 days prior to a colonoscopy. He understands that we will communicate those recommendations to his cardiologist to make sure that he is safe with that advice. I see no reason for any further blood tests or imaging studies prior to surveillance colonoscopy.   Owens Loffler, MD [facility] 11/10/2015, 10:10 AM  Cc: Laurey Morale, MD

## 2015-11-10 NOTE — Patient Instructions (Addendum)
We will communicate with Dr. Ola Spurr with [facility] (phone [phone]) about the safety of holding your coumadin for 5 days prior to a colonoscopy.  You will be contaced by our office prior to your procedure for directions on holding your Coumadin/Warfarin.  If you do not hear from our office 1 week prior to your scheduled procedure, please call [phone] to discuss.  You will be set up for a colonoscopy for polyp surveillance.

## 2015-11-11 ENCOUNTER — Ambulatory Visit (INDEPENDENT_AMBULATORY_CARE_PROVIDER_SITE_OTHER): Payer: Medicare Other | Admitting: Family Medicine

## 2015-11-11 VITALS — BP 160/83 | HR 98

## 2015-11-11 DIAGNOSIS — I4891 Unspecified atrial fibrillation: Secondary | ICD-10-CM

## 2015-11-11 LAB — POCT INR: INR: 1.3

## 2015-11-12 NOTE — Progress Notes (Signed)
   Subjective:    Patient ID: Tanner Hensley, male    DOB: [birth year], 75 y.o.   MRN: [id number] Pt was notified of new dosing instructions & was transferred to scheduling for a 2 week recheck.  Beatris Ship, CMA HPI    Review of Systems     Objective:   Physical Exam        Assessment & Plan:

## 2015-11-27 ENCOUNTER — Ambulatory Visit: Payer: Medicare Other

## 2015-11-28 ENCOUNTER — Ambulatory Visit (INDEPENDENT_AMBULATORY_CARE_PROVIDER_SITE_OTHER): Payer: Medicare Other | Admitting: Family Medicine

## 2015-11-28 VITALS — BP 151/109 | HR 80 | Wt 158.0 lb

## 2015-11-28 DIAGNOSIS — I4891 Unspecified atrial fibrillation: Secondary | ICD-10-CM | POA: Diagnosis not present

## 2015-11-28 LAB — POCT INR: INR: 2.4

## 2015-11-28 NOTE — Progress Notes (Signed)
Pt's wife notified of recommendations and will inform the pt.

## 2015-12-05 DIAGNOSIS — I482 Chronic atrial fibrillation: Secondary | ICD-10-CM | POA: Diagnosis not present

## 2015-12-05 DIAGNOSIS — R5383 Other fatigue: Secondary | ICD-10-CM | POA: Diagnosis not present

## 2015-12-05 DIAGNOSIS — I2510 Atherosclerotic heart disease of native coronary artery without angina pectoris: Secondary | ICD-10-CM | POA: Diagnosis not present

## 2015-12-05 DIAGNOSIS — I495 Sick sinus syndrome: Secondary | ICD-10-CM | POA: Diagnosis not present

## 2015-12-05 DIAGNOSIS — R0609 Other forms of dyspnea: Secondary | ICD-10-CM | POA: Diagnosis not present

## 2015-12-11 DIAGNOSIS — R0609 Other forms of dyspnea: Secondary | ICD-10-CM | POA: Diagnosis not present

## 2015-12-11 DIAGNOSIS — R5383 Other fatigue: Secondary | ICD-10-CM | POA: Diagnosis not present

## 2015-12-16 ENCOUNTER — Telehealth: Payer: Self-pay

## 2015-12-16 NOTE — Telephone Encounter (Signed)
The pt has been notified that he can hold coumadin 4 days prior to procedure per Dr Ola Spurr.  Response to be scanned into EPIC

## 2015-12-30 DIAGNOSIS — M47814 Spondylosis without myelopathy or radiculopathy, thoracic region: Secondary | ICD-10-CM | POA: Diagnosis not present

## 2015-12-30 DIAGNOSIS — J929 Pleural plaque without asbestos: Secondary | ICD-10-CM | POA: Diagnosis not present

## 2015-12-30 DIAGNOSIS — Z01818 Encounter for other preprocedural examination: Secondary | ICD-10-CM | POA: Diagnosis not present

## 2015-12-30 DIAGNOSIS — R9439 Abnormal result of other cardiovascular function study: Secondary | ICD-10-CM | POA: Diagnosis not present

## 2015-12-30 DIAGNOSIS — I4891 Unspecified atrial fibrillation: Secondary | ICD-10-CM | POA: Diagnosis not present

## 2015-12-31 ENCOUNTER — Encounter: Payer: Medicare Other | Admitting: Gastroenterology

## 2016-01-01 DIAGNOSIS — Z79899 Other long term (current) drug therapy: Secondary | ICD-10-CM | POA: Diagnosis not present

## 2016-01-01 DIAGNOSIS — R0609 Other forms of dyspnea: Secondary | ICD-10-CM | POA: Diagnosis not present

## 2016-01-01 DIAGNOSIS — R5383 Other fatigue: Secondary | ICD-10-CM | POA: Diagnosis not present

## 2016-01-01 DIAGNOSIS — Z7901 Long term (current) use of anticoagulants: Secondary | ICD-10-CM | POA: Diagnosis not present

## 2016-01-01 DIAGNOSIS — I10 Essential (primary) hypertension: Secondary | ICD-10-CM | POA: Diagnosis not present

## 2016-01-01 DIAGNOSIS — Z950 Presence of cardiac pacemaker: Secondary | ICD-10-CM | POA: Diagnosis not present

## 2016-01-01 DIAGNOSIS — I2510 Atherosclerotic heart disease of native coronary artery without angina pectoris: Secondary | ICD-10-CM | POA: Diagnosis not present

## 2016-01-01 DIAGNOSIS — Z7982 Long term (current) use of aspirin: Secondary | ICD-10-CM | POA: Diagnosis not present

## 2016-01-01 DIAGNOSIS — I495 Sick sinus syndrome: Secondary | ICD-10-CM | POA: Diagnosis not present

## 2016-01-01 DIAGNOSIS — R9439 Abnormal result of other cardiovascular function study: Secondary | ICD-10-CM | POA: Diagnosis not present

## 2016-01-14 DIAGNOSIS — I442 Atrioventricular block, complete: Secondary | ICD-10-CM | POA: Diagnosis not present

## 2016-01-14 DIAGNOSIS — I482 Chronic atrial fibrillation: Secondary | ICD-10-CM | POA: Diagnosis not present

## 2016-01-14 DIAGNOSIS — I2510 Atherosclerotic heart disease of native coronary artery without angina pectoris: Secondary | ICD-10-CM | POA: Diagnosis not present

## 2016-01-14 DIAGNOSIS — I10 Essential (primary) hypertension: Secondary | ICD-10-CM | POA: Diagnosis not present

## 2016-01-14 DIAGNOSIS — I495 Sick sinus syndrome: Secondary | ICD-10-CM | POA: Diagnosis not present

## 2016-01-15 ENCOUNTER — Telehealth: Payer: Self-pay | Admitting: Family Medicine

## 2016-01-15 NOTE — Telephone Encounter (Signed)
Wife states you have recommended a BP machine in the past.  Pt is needing to purchase noe due to cardiologist making some changes in his meds. Pt purchased a  ormon 7 series,  But before they use it, pt would like to make sure this is the correct one.   They bought the wrong one in the past. Please call wife.

## 2016-01-15 NOTE — Telephone Encounter (Signed)
I spoke with pt  

## 2016-01-21 ENCOUNTER — Other Ambulatory Visit: Payer: Self-pay | Admitting: Family Medicine

## 2016-01-21 ENCOUNTER — Telehealth: Payer: Self-pay | Admitting: Family Medicine

## 2016-01-21 NOTE — Telephone Encounter (Signed)
[facility] and spoke with pt. 

## 2016-01-21 NOTE — Telephone Encounter (Signed)
Can we refill this? 

## 2016-01-21 NOTE — Telephone Encounter (Signed)
Pt forgot to reorder his lisinopril (PRINIVIL,ZESTRIL) 40 MG tablet  And  Is now out.  [facility] is sending it out, but pt would like a   10 day supply to get him through until it gets here.  [facility] [place]

## 2016-02-18 ENCOUNTER — Ambulatory Visit (INDEPENDENT_AMBULATORY_CARE_PROVIDER_SITE_OTHER): Payer: Medicare Other | Admitting: Osteopathic Medicine

## 2016-02-18 VITALS — BP 136/88 | HR 79

## 2016-02-18 DIAGNOSIS — I4891 Unspecified atrial fibrillation: Secondary | ICD-10-CM

## 2016-02-18 LAB — POCT INR: INR: 2.5

## 2016-02-18 NOTE — Patient Instructions (Signed)
Continue current dose and recheck 4 weeks

## 2016-02-18 NOTE — Progress Notes (Signed)
Pt advised, verbalized understanding. No further questions.  

## 2016-03-02 DIAGNOSIS — I482 Chronic atrial fibrillation: Secondary | ICD-10-CM | POA: Diagnosis not present

## 2016-03-02 DIAGNOSIS — I2510 Atherosclerotic heart disease of native coronary artery without angina pectoris: Secondary | ICD-10-CM | POA: Diagnosis not present

## 2016-03-02 DIAGNOSIS — I495 Sick sinus syndrome: Secondary | ICD-10-CM | POA: Diagnosis not present

## 2016-03-02 DIAGNOSIS — I442 Atrioventricular block, complete: Secondary | ICD-10-CM | POA: Diagnosis not present

## 2016-03-02 DIAGNOSIS — I10 Essential (primary) hypertension: Secondary | ICD-10-CM | POA: Diagnosis not present

## 2016-03-16 ENCOUNTER — Ambulatory Visit (INDEPENDENT_AMBULATORY_CARE_PROVIDER_SITE_OTHER): Payer: Medicare Other | Admitting: Family Medicine

## 2016-03-16 VITALS — BP 170/102 | HR 90

## 2016-03-16 DIAGNOSIS — I4891 Unspecified atrial fibrillation: Secondary | ICD-10-CM | POA: Diagnosis not present

## 2016-03-16 LAB — POCT INR: INR: 2.4

## 2016-03-16 NOTE — Progress Notes (Signed)
Patient's wife advised of recommendations.  

## 2016-03-29 DIAGNOSIS — I495 Sick sinus syndrome: Secondary | ICD-10-CM | POA: Diagnosis not present

## 2016-05-21 ENCOUNTER — Ambulatory Visit (INDEPENDENT_AMBULATORY_CARE_PROVIDER_SITE_OTHER): Payer: Medicare Other | Admitting: Physician Assistant

## 2016-05-21 DIAGNOSIS — I4891 Unspecified atrial fibrillation: Secondary | ICD-10-CM

## 2016-05-21 LAB — POCT INR: INR: 1.9

## 2016-05-21 NOTE — Progress Notes (Signed)
Called pt, no answer. Was unable to leave message.

## 2016-05-24 NOTE — Progress Notes (Signed)
Patient left a message inquiring about INR results. Called back and left instructions on vm and to call back if any questions.

## 2016-06-15 ENCOUNTER — Ambulatory Visit (INDEPENDENT_AMBULATORY_CARE_PROVIDER_SITE_OTHER): Payer: Medicare Other

## 2016-06-15 DIAGNOSIS — Z23 Encounter for immunization: Secondary | ICD-10-CM | POA: Diagnosis not present

## 2016-06-30 DIAGNOSIS — Z950 Presence of cardiac pacemaker: Secondary | ICD-10-CM | POA: Diagnosis not present

## 2016-07-01 ENCOUNTER — Ambulatory Visit (INDEPENDENT_AMBULATORY_CARE_PROVIDER_SITE_OTHER): Payer: Medicare Other | Admitting: Family Medicine

## 2016-07-01 DIAGNOSIS — I4891 Unspecified atrial fibrillation: Secondary | ICD-10-CM

## 2016-07-01 LAB — POCT INR: INR: 3.6

## 2016-07-01 NOTE — Progress Notes (Signed)
Pt.notified

## 2016-07-01 NOTE — Progress Notes (Signed)
Pt presents to the clinic for a INR check. Denies changes with medications, diet, and skipped doses. Pt confirmed dose of 2.5 mg on Mondays and 5 mg all other days. Pt advised that we will call him with further instructions. -EMH/RMA

## 2016-07-13 ENCOUNTER — Other Ambulatory Visit: Payer: Self-pay | Admitting: Family Medicine

## 2016-07-14 DIAGNOSIS — I2510 Atherosclerotic heart disease of native coronary artery without angina pectoris: Secondary | ICD-10-CM | POA: Diagnosis not present

## 2016-07-14 DIAGNOSIS — I481 Persistent atrial fibrillation: Secondary | ICD-10-CM | POA: Diagnosis not present

## 2016-07-14 DIAGNOSIS — Z950 Presence of cardiac pacemaker: Secondary | ICD-10-CM | POA: Diagnosis not present

## 2016-07-14 DIAGNOSIS — I442 Atrioventricular block, complete: Secondary | ICD-10-CM | POA: Diagnosis not present

## 2016-07-14 DIAGNOSIS — I482 Chronic atrial fibrillation: Secondary | ICD-10-CM | POA: Diagnosis not present

## 2016-07-23 ENCOUNTER — Ambulatory Visit (INDEPENDENT_AMBULATORY_CARE_PROVIDER_SITE_OTHER): Payer: Medicare Other | Admitting: Family Medicine

## 2016-07-23 ENCOUNTER — Encounter: Payer: Self-pay | Admitting: Family Medicine

## 2016-07-23 VITALS — BP 110/74 | HR 83 | Temp 98.4°F | Ht 65.0 in | Wt 157.0 lb

## 2016-07-23 DIAGNOSIS — R413 Other amnesia: Secondary | ICD-10-CM | POA: Diagnosis not present

## 2016-07-23 MED ORDER — DONEPEZIL HCL 5 MG PO TABS: 5.0000 mg | ORAL_TABLET | Freq: Every day | ORAL | 2 refills | Status: DC

## 2016-07-23 NOTE — Progress Notes (Signed)
Pre visit review using our clinic review tool, if applicable. No additional management support is needed unless otherwise documented below in the visit note. 

## 2016-07-23 NOTE — Progress Notes (Signed)
   Subjective:    Patient ID: Tanner Hensley, male    DOB: 1941-02-11, 75 y.o.   MRN: [id number]  HPI Here with his wife to ask about trying Aricept again. He has been dealing with memory issues for the past 5 years or so, and about 3 years ago he tried Aricept 5 mg daily. He had to stop this after a few weeks due to nausea. He has been taking Namenda for the past few years and has tolerated this well. He has had further memory losses however, and bot of them would like to try Aricept again. Otherwise his cardiac status right now is excellent.     Review of Systems  Constitutional: Negative.   Respiratory: Negative.   Cardiovascular: Negative.   Neurological: Negative.        Objective:   Physical Exam  Constitutional: He is oriented to person, place, and time. He appears well-developed and well-nourished.  Cardiovascular: Normal rate, regular rhythm, normal heart sounds and intact distal pulses.   Pulmonary/Chest: Effort normal and breath sounds normal.  Neurological: He is alert and oriented to person, place, and time.          Assessment & Plan:  Memory loss. He will start on Aricept 5 mg daily, and we will plan on increasing this to 10 mg daily after a month.  Laurey Morale, MD

## 2016-08-16 ENCOUNTER — Telehealth: Payer: Self-pay | Admitting: Family Medicine

## 2016-08-16 NOTE — Telephone Encounter (Signed)
Pt states he is doing very well on the donepezil (ARICEPT) 5 MG tablet  It was his understanding dr fry would increase to 10mg  if he was doing ok on this.  [facility] [place]

## 2016-08-16 NOTE — Telephone Encounter (Signed)
Increase the Aricept to 10 mg daily. Call in one year supply

## 2016-08-17 MED ORDER — DONEPEZIL HCL 10 MG PO TABS: 10.0000 mg | ORAL_TABLET | Freq: Every day | ORAL | 3 refills | Status: DC

## 2016-08-17 NOTE — Telephone Encounter (Signed)
[facility], updated medication list and spoke with pt.

## 2016-08-27 ENCOUNTER — Encounter: Payer: Self-pay | Admitting: Family Medicine

## 2016-08-27 ENCOUNTER — Ambulatory Visit (INDEPENDENT_AMBULATORY_CARE_PROVIDER_SITE_OTHER)
Admission: RE | Admit: 2016-08-27 | Discharge: 2016-08-27 | Disposition: A | Discharge status: Home / Self Care | Payer: Medicare Other | Source: Ambulatory Visit | Attending: Family Medicine | Admitting: Family Medicine

## 2016-08-27 ENCOUNTER — Ambulatory Visit (INDEPENDENT_AMBULATORY_CARE_PROVIDER_SITE_OTHER): Payer: Medicare Other | Admitting: Family Medicine

## 2016-08-27 VITALS — BP 165/92 | HR 83 | Temp 98.2°F | Ht 65.0 in | Wt 158.0 lb

## 2016-08-27 DIAGNOSIS — M5442 Lumbago with sciatica, left side: Secondary | ICD-10-CM

## 2016-08-27 DIAGNOSIS — M545 Low back pain: Secondary | ICD-10-CM | POA: Diagnosis not present

## 2016-08-27 MED ORDER — METHYLPREDNISOLONE 4 MG PO TBPK: ORAL_TABLET | ORAL | 0 refills | Status: DC

## 2016-08-27 NOTE — Progress Notes (Signed)
   Subjective:    Patient ID: Tanner Hensley, male    DOB: 01/09/1941, 75 y.o.   MRN: [id number]  HPI Here for a sharp pain lin the left lower back and left hip that started 4 days ago. No recent trauma. The pain started one morning when he got up, then after playing 11 holes of frisbee golf the pain got a lot worse. It does not radiate down the leg. No weakness. He is using a cane to walk. He takes Tylenol with partial relief.    Review of Systems  Constitutional: Negative.   Respiratory: Negative.   Cardiovascular: Negative.   Musculoskeletal: Positive for arthralgias, back pain and gait problem.       Objective:   Physical Exam  Constitutional: He is oriented to person, place, and time.  In pain, walks with a cane   Cardiovascular: Normal rate, regular rhythm, normal heart sounds and intact distal pulses.   Pulmonary/Chest: Effort normal and breath sounds normal.  Musculoskeletal:  Very tender in the left lower back and the left sciatic notch. The hips have full ROM. The spine has full ROM with negative SLR  Neurological: He is alert and oriented to person, place, and time.          Assessment & Plan:  Left lower back and hip pain, consistent with sciatica pain. Use Tylenol prn and add a Medrol dose pack. Get lumbar spine Xrays today.  Alysia Penna, MD

## 2016-08-27 NOTE — Progress Notes (Signed)
Pre visit review using our clinic review tool, if applicable. No additional management support is needed unless otherwise documented below in the visit note. 

## 2016-08-31 ENCOUNTER — Other Ambulatory Visit: Payer: Self-pay | Admitting: Family Medicine

## 2016-08-31 ENCOUNTER — Telehealth: Payer: Self-pay | Admitting: Family Medicine

## 2016-08-31 NOTE — Telephone Encounter (Signed)
I spoke with pt and advised Dr. Sarajane Jews is out of the office until Friday 09/03/2016. Pt would like the referral for injections that was discussed during his last office visit. If pain gets any worse then pt will call here and try to see another provider.

## 2016-08-31 NOTE — Telephone Encounter (Signed)
Wife states pt saw dr fry and now is not better.  Wife states pt is worse. She prefers to speak with Sunday Spillers before scheduling with another provider, would like to know what you recommend. Declined another provider. Pt now has pain in groin, knee as well as the hip. Wife states better to call her first.

## 2016-09-01 NOTE — Telephone Encounter (Signed)
I put in a referral to Dr. Verlin Dike, a spinal surgeon. He will evaluate and treat him as he thinks best

## 2016-09-01 NOTE — Addendum Note (Signed)
Addended by: Alysia Penna A on: 09/01/2016 08:04 AM   Modules accepted: Orders

## 2016-09-01 NOTE — Telephone Encounter (Signed)
I left a voice message with below information. 

## 2016-09-03 ENCOUNTER — Telehealth: Payer: Self-pay | Admitting: Family Medicine

## 2016-09-03 MED ORDER — GABAPENTIN 100 MG PO CAPS: 100.0000 mg | ORAL_CAPSULE | Freq: Three times a day (TID) | ORAL | 0 refills | Status: DC

## 2016-09-03 NOTE — Telephone Encounter (Signed)
Pt has an appt to see dumonski on 09-10-16 for bone spurs and is taking tylenol and would like something stronger. [facility]

## 2016-09-03 NOTE — Telephone Encounter (Signed)
[facility] and I left a voice message with below information.

## 2016-09-03 NOTE — Telephone Encounter (Signed)
There is no other pain medication (per se) that he can take, but let him try taking Gabapentin in addition to Tylenol. Call in 100 mg to take TID, #90 with no rf

## 2016-09-07 NOTE — Telephone Encounter (Signed)
Pt states he is still in so much pain, the gabapentin (NEURONTIN) 100 MG capsule Is not helping. Pt would like to know if this can be increased. Tylenol not working Pt would like you to call his wife to discuss [phone].

## 2016-09-07 NOTE — Telephone Encounter (Signed)
I left a voice message for [place] with below information. Advised pt to check back in about 1 week and let us know how this dose is working?

## 2016-09-07 NOTE — Telephone Encounter (Signed)
Try talking 300 mg of Gabapentin (3 tabs) at a time TID

## 2016-09-10 ENCOUNTER — Telehealth: Payer: Self-pay | Admitting: Family Medicine

## 2016-09-10 DIAGNOSIS — M5416 Radiculopathy, lumbar region: Secondary | ICD-10-CM | POA: Diagnosis not present

## 2016-09-10 NOTE — Telephone Encounter (Signed)
Pts wife state that the pharmacy is concerned about filling the Rx [place] due to the codeine and vicodin that the pt is allergic to.  Pts wife would like to have a call back today.

## 2016-09-10 NOTE — Telephone Encounter (Signed)
Dr. Sarajane Jews did review note and agree's with Dr. Lynann Bologna it will be safe to take the Dilaudid. I left a voice message for pt with this information.

## 2016-09-15 ENCOUNTER — Other Ambulatory Visit: Payer: Self-pay | Admitting: Orthopedic Surgery

## 2016-09-16 ENCOUNTER — Other Ambulatory Visit: Payer: Self-pay | Admitting: Orthopedic Surgery

## 2016-09-16 DIAGNOSIS — M5416 Radiculopathy, lumbar region: Secondary | ICD-10-CM

## 2016-09-17 ENCOUNTER — Telehealth: Payer: Self-pay | Admitting: Sports Medicine

## 2016-09-17 ENCOUNTER — Telehealth: Payer: Self-pay | Admitting: Family Medicine

## 2016-09-17 ENCOUNTER — Encounter: Payer: Self-pay | Admitting: Sports Medicine

## 2016-09-17 NOTE — Telephone Encounter (Signed)
Patient really needs to stop his Coumadin 5 days before the lumbar myelogram, he also needs to have his INR checked beforehand to ensure its returned to normal.

## 2016-09-17 NOTE — Telephone Encounter (Signed)
Pt needs a refill on gabapentin 300 mg three times a day #90 w/refills [facility]. Pt wife has unblock our number

## 2016-09-17 NOTE — Telephone Encounter (Signed)
Pt's wife advised of recommendation. Verbalized understanding.

## 2016-09-17 NOTE — Telephone Encounter (Signed)
Pt is followed by our office for his coumadin therapy. Pt is getting scheduled for [facility] MYELOGRAPHY LUMBAR INJ LUMBOSACRAL and needs to know when to stop the coumadin prior to procedure and when restart after. Will route to Provider in office today for review.

## 2016-09-20 ENCOUNTER — Telehealth: Payer: Self-pay | Admitting: Family Medicine

## 2016-09-20 MED ORDER — GABAPENTIN 300 MG PO CAPS
300.0000 mg | ORAL_CAPSULE | Freq: Three times a day (TID) | ORAL | 5 refills | Status: DC
Start: 2016-09-20 — End: 2016-10-25

## 2016-09-20 NOTE — Telephone Encounter (Signed)
I left a voice message for pt to return my call, would like to go over dosage change with pt.

## 2016-09-20 NOTE — Telephone Encounter (Signed)
Printed, ready to fax

## 2016-09-20 NOTE — Telephone Encounter (Signed)
Can we change strength and send in a new script?

## 2016-09-20 NOTE — Telephone Encounter (Signed)
[facility], also spoke with pt and he is aware of dose change, see below information.

## 2016-09-20 NOTE — Telephone Encounter (Signed)
Patient's wife called and gave additional fax numbers to [facility] just in case the fax you had was not correct since they said they did not get your fax from this morning ([place]) Fax: [phone], [phone] or [phone] Attn: Judson Roch. Thanks

## 2016-09-20 NOTE — Telephone Encounter (Signed)
Patient's wife called trying to return your call from 9 this morning about a paper being faxed over to [facility] and they are stating they still do not have the paper. She would like for you to call her at [phone] today. Thanks

## 2016-09-20 NOTE — Telephone Encounter (Signed)
Faxed to all 3 numbers.

## 2016-09-27 ENCOUNTER — Ambulatory Visit (INDEPENDENT_AMBULATORY_CARE_PROVIDER_SITE_OTHER): Payer: Medicare Other | Admitting: Sports Medicine

## 2016-09-27 DIAGNOSIS — I4891 Unspecified atrial fibrillation: Secondary | ICD-10-CM

## 2016-09-27 LAB — POCT INR: INR: 1.2

## 2016-09-28 ENCOUNTER — Ambulatory Visit
Admission: RE | Admit: 2016-09-28 | Discharge: 2016-09-28 | Disposition: A | Discharge status: Home / Self Care | Payer: Medicare Other | Source: Ambulatory Visit | Attending: Orthopedic Surgery | Admitting: Orthopedic Surgery

## 2016-09-28 DIAGNOSIS — M5416 Radiculopathy, lumbar region: Secondary | ICD-10-CM | POA: Diagnosis not present

## 2016-09-28 MED ORDER — ONDANSETRON HCL 4 MG/2ML IJ SOLN: 4.0000 mg | Freq: Four times a day (QID) | INTRAMUSCULAR | Status: DC | PRN

## 2016-09-28 MED ORDER — DIAZEPAM 5 MG PO TABS: 5.0000 mg | ORAL_TABLET | Freq: Once | ORAL | Status: DC

## 2016-09-28 MED ORDER — IOPAMIDOL (ISOVUE-M 200) INJECTION 41%
15.0000 mL | Freq: Once | INTRAMUSCULAR | Status: AC
  Administered 2016-09-28: 15 mL via INTRATHECAL

## 2016-09-28 NOTE — Discharge Instructions (Signed)
° °  Myelogram Discharge Instructions ° °1. Go home and rest quietly for the next 24 hours.  It is important to lie flat for the next 24 hours.  Get up only to go to the restroom.  You may lie in the bed or on a couch on your back, your stomach, your left side or your right side.  You may have one pillow under your head.  You may have pillows between your knees while you are on your side or under your knees while you are on your back. ° °2. DO NOT drive today.  Recline the seat as far back as it will go, while still wearing your seat belt, on the way home. ° °3. You may get up to go to the bathroom as needed.  You may sit up for 10 minutes to eat.  You may resume your normal diet and medications unless otherwise indicated.  Drink plenty of extra fluids today and tomorrow. ° °4. The incidence of a spinal headache with nausea and/or vomiting is about 5% (one in 20 patients).  If you develop a headache, lie flat and drink plenty of fluids until the headache goes away.  Caffeinated beverages may be helpful.  If you develop severe nausea and vomiting or a headache that does not go away with flat bed rest, call [phone]. ° °5. You may resume normal activities after your 24 hours of bed rest is over; however, do not exert yourself strongly or do any heavy lifting tomorrow. ° °6. Call your physician for a follow-up appointment.  ° °You may resume Coumadin/Warfarin today. °

## 2016-10-01 ENCOUNTER — Encounter: Payer: Medicare Other | Admitting: Family Medicine

## 2016-10-01 DIAGNOSIS — M5416 Radiculopathy, lumbar region: Secondary | ICD-10-CM | POA: Diagnosis not present

## 2016-10-04 ENCOUNTER — Other Ambulatory Visit: Payer: Self-pay | Admitting: Orthopedic Surgery

## 2016-10-04 DIAGNOSIS — Z950 Presence of cardiac pacemaker: Secondary | ICD-10-CM | POA: Diagnosis not present

## 2016-10-04 DIAGNOSIS — Z45018 Encounter for adjustment and management of other part of cardiac pacemaker: Secondary | ICD-10-CM | POA: Diagnosis not present

## 2016-10-05 ENCOUNTER — Encounter: Payer: Self-pay | Admitting: Family Medicine

## 2016-10-06 ENCOUNTER — Telehealth: Payer: Self-pay | Admitting: Family Medicine

## 2016-10-06 NOTE — Telephone Encounter (Signed)
Thank you Tanner Hensley, that is what I told them.

## 2016-10-06 NOTE — Telephone Encounter (Signed)
Tanner Hensley,  It looks like Family Medicine (Dr. Druscilla Brownie) manages this patient.  This will need to be routed to him).

## 2016-10-06 NOTE — Telephone Encounter (Signed)
Tanner Hensley with [facility] states pt will see Dr Lynann Bologna on Thursday feb 7 for outpt procedure and they need instructions on when safe to stop and restart coum.  Angela Nevin states they sent something to dr Hali Marry, MD office, but they sent back with instructions to call his PCP. Please advise. Thanks!

## 2016-10-07 ENCOUNTER — Telehealth: Payer: Self-pay | Admitting: Family Medicine

## 2016-10-07 NOTE — Pre-Procedure Instructions (Signed)
    MALAKHAI BARSANTI  10/07/2016      [id number] [place] [place] - [address] [place] [address] Phone: [phone] Fax: [phone]  [place], [place] [address] [place] [address] [place] [address] Phone: [phone] Fax: [phone]    Your procedure is scheduled on  Wednesday, February 7th   Report to [facility] Admitting at 9:00 AM.             (posted surgery time 11:00 am - 3:10 pm)   Call this number if you have problems the MORNING of surgery:  [phone]. [facility] is closed on weekends and opens at 5 am, Mon - Fri.   Remember:  Do not eat food or drink liquids after midnight Tuesday.   Take these medicines the morning of surgery with A SIP OF WATER : Gabapentin, pain medication, Metoprolol.              4-5 days prior to surgery, STOP taking any Vitamins, Herbal Supplements, Anti-inflammatories.               Coumadin will be stopped_______________as per ordered   Do not wear jewelry - no rings or watches.  Do not wear lotions, colognes or deoderant.             Men may shave face and neck.   Do not bring valuables to the hospital.  [facility] is not responsible for any belongings or valuables.  Contacts, dentures or bridgework may not be worn into surgery.  Leave your suitcase in the car.  After surgery it may be brought to your room.  For patients admitted to the hospital, discharge time will be determined by your treatment team.  Please read over the following fact sheets that you were given. Pain Booklet, MRSA Information and Surgical Site Infection Prevention

## 2016-10-07 NOTE — Telephone Encounter (Signed)
Patient needs a letter from PCP stating when he is to stop coumadin and when to restart after. Routing to treating provider.

## 2016-10-08 ENCOUNTER — Encounter (HOSPITAL_COMMUNITY)
Admission: RE | Admit: 2016-10-08 | Discharge: 2016-10-08 | Disposition: A | Discharge status: Home / Self Care | Payer: Medicare Other | Source: Ambulatory Visit | Attending: Orthopedic Surgery | Admitting: Orthopedic Surgery

## 2016-10-08 ENCOUNTER — Other Ambulatory Visit: Payer: Self-pay

## 2016-10-08 ENCOUNTER — Encounter (HOSPITAL_COMMUNITY): Payer: Self-pay

## 2016-10-08 DIAGNOSIS — Z01812 Encounter for preprocedural laboratory examination: Secondary | ICD-10-CM | POA: Diagnosis not present

## 2016-10-08 DIAGNOSIS — Z01810 Encounter for preprocedural cardiovascular examination: Secondary | ICD-10-CM | POA: Insufficient documentation

## 2016-10-08 DIAGNOSIS — I517 Cardiomegaly: Secondary | ICD-10-CM | POA: Diagnosis not present

## 2016-10-08 DIAGNOSIS — Z01818 Encounter for other preprocedural examination: Secondary | ICD-10-CM

## 2016-10-08 HISTORY — DX: Heart failure, unspecified: I50.9

## 2016-10-08 HISTORY — DX: Presence of cardiac pacemaker: Z95.0

## 2016-10-08 HISTORY — DX: Cardiac arrhythmia, unspecified: I49.9

## 2016-10-08 HISTORY — DX: Myoneural disorder, unspecified: G70.9

## 2016-10-08 LAB — URINALYSIS, ROUTINE W REFLEX MICROSCOPIC
BACTERIA UA: NONE SEEN
BILIRUBIN URINE: NEGATIVE
Glucose, UA: NEGATIVE mg/dL
HGB URINE DIPSTICK: NEGATIVE
Ketones, ur: NEGATIVE mg/dL
NITRITE: NEGATIVE
PH: 5 (ref 5.0–8.0)
Protein, ur: NEGATIVE mg/dL
SPECIFIC GRAVITY, URINE: 1.023 (ref 1.005–1.030)
Squamous Epithelial / LPF: NONE SEEN

## 2016-10-08 LAB — CBC WITH DIFFERENTIAL/PLATELET
BASOS ABS: 0 10*3/uL (ref 0.0–0.1)
BASOS PCT: 0 %
EOS ABS: 0.2 10*3/uL (ref 0.0–0.7)
EOS PCT: 2 %
HCT: 40.5 % (ref 39.0–52.0)
Hemoglobin: 13.7 g/dL (ref 13.0–17.0)
Lymphocytes Relative: 19 %
Lymphs Abs: 1.7 10*3/uL (ref 0.7–4.0)
MCH: 34.1 pg — ABNORMAL HIGH (ref 26.0–34.0)
MCHC: 33.8 g/dL (ref 30.0–36.0)
MCV: 100.7 fL — AB (ref 78.0–100.0)
MONO ABS: 0.7 10*3/uL (ref 0.1–1.0)
Monocytes Relative: 9 %
Neutro Abs: 6 10*3/uL (ref 1.7–7.7)
Neutrophils Relative %: 70 %
PLATELETS: 139 10*3/uL — AB (ref 150–400)
RBC: 4.02 MIL/uL — ABNORMAL LOW (ref 4.22–5.81)
RDW: 12.6 % (ref 11.5–15.5)
WBC: 8.5 10*3/uL (ref 4.0–10.5)

## 2016-10-08 LAB — COMPREHENSIVE METABOLIC PANEL
ALT: 18 U/L (ref 17–63)
AST: 26 U/L (ref 15–41)
Albumin: 4.1 g/dL (ref 3.5–5.0)
Alkaline Phosphatase: 62 U/L (ref 38–126)
Anion gap: 7 (ref 5–15)
BUN: 21 mg/dL — AB (ref 6–20)
CHLORIDE: 108 mmol/L (ref 101–111)
CO2: 27 mmol/L (ref 22–32)
Calcium: 9.7 mg/dL (ref 8.9–10.3)
Creatinine, Ser: 0.97 mg/dL (ref 0.61–1.24)
GFR calc Af Amer: >60 mL/min (ref >60)
Glucose, Bld: 93 mg/dL (ref 65–99)
POTASSIUM: 4.2 mmol/L (ref 3.5–5.1)
SODIUM: 142 mmol/L (ref 135–145)
Total Bilirubin: 0.5 mg/dL (ref 0.3–1.2)
Total Protein: 6.7 g/dL (ref 6.5–8.1)

## 2016-10-08 LAB — ABO/RH: ABO/RH(D): B POS

## 2016-10-08 LAB — TYPE AND SCREEN
ABO/RH(D): B POS
Antibody Screen: NEGATIVE

## 2016-10-08 LAB — PROTIME-INR
INR: 2.43
PROTHROMBIN TIME: 26.9 s — AB (ref 11.4–15.2)

## 2016-10-08 LAB — SURGICAL PCR SCREEN
MRSA, PCR: NEGATIVE
STAPHYLOCOCCUS AUREUS: NEGATIVE

## 2016-10-08 LAB — APTT: APTT: 38 s — AB (ref 24–36)

## 2016-10-08 NOTE — Progress Notes (Addendum)
[facility]  Rep is Heron Sabins  [phone]   He is aware of date & time of surgery. PCP is Dr. Wandra Scot, LOV 08/2016 Cardio is Dr. Ola Spurr.  Clearance note from him is inside chart. Spoke with nurse for Dr. Ola Spurr, and he is to stop his coumadin & aspirin tomorrow. Wife states that 'long ago, he was labeled a diabetic" currently takes no meds, and is diet controlled and has lost weight.  Cath done 2017, PCI in 2011.

## 2016-10-09 LAB — HEMOGLOBIN A1C
HEMOGLOBIN A1C: 5.9 % — AB (ref 4.8–5.6)
MEAN PLASMA GLUCOSE: 123 mg/dL

## 2016-10-11 NOTE — Progress Notes (Signed)
Anesthesia chart review: Patient is a 76 year old male scheduled for left sided L3-4 transforaminal lumbar interbody fusion on 10/13/2016 by Dr. Lynann Bologna.  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  Meds include Fosamax, aspirin 81 mg, Aricept, flaxseed, Neurontin, Dilaudid, lisinopril, Namenda, Lopressor, warfarin. Warfarin and ASA to be held five days prior to surgery.  PCP is Dr. Alysia Penna. He deferred surgical risk assessment to cardiology.  Cardiologist is Dr. Adrian Prows with [facility]. According to 10/07/16 and 10/08/16 telephone encounters by Pete Pelt, RN in [place], "Form faxed to [facility] ortho with moderate risk for surgery, may hold warfarin x 5 days, resume when safe, has [employer] d-ppm programmed VVI 80, in 100% a fib. I called and spoke with wife and went over risks of holding warfarin, increased risk of stroke while off warfarin. Wife verbalized understanding. Form faxed to [facility] [phone].Marland KitchenMarland KitchenMay hold ASA as well. Pt and wife aware of risks." Paper form faxed by Bernita Raisin, NP-C indicates, "This patient is at increased cardiac risk for surgery. CVA with holding warfarin."  EP Cardiologist is Dr. Mahala Menghini with [facility].   BP 139/72   Pulse 85   Temp 37 C   Resp 20   Ht 5\' 6"  (1.676 m)   Wt 157 lb 6.4 oz (71.4 kg)   SpO2 98%   BMI 25.41 kg/m   EKG 10/08/16: V-paced rhythm.  Cardiac cath 01/01/16 ([place]; Care Everywhere. Cath done due to abnormal stress test 12/2015 showing "inferior scar and possible peri-infarct  ischemia, EF 60%): Summary: LMCA: Mild luminal irregularities <20% LAD: ostial LAD 30% LCx: Mild luminal irregularities <20% RCA: Mild luminal irregularities <20% Conclusions: Mild to moderate CAD. Patent LAD stent. Normal LV function, EF 55%.  CXR 10/08/16: IMPRESSION: No acute cardiopulmonary disease. Cardiac pacer stable position. Stable cardiomegaly.  Preoperative labs noted. CR 0.97. H/H 13.7/40.5. PLT 139K. PT/INR 26.9/2.43. PTT 38. Glucose 98. A1c 5.9. UA trace leukocytes, negative nitrite. He will need PT/INR on arrival.  If follow-up labs acceptable and otherwise no acute changes then I anticipate he can proceed as planned.  George Hugh [facility] Short Stay Center/Anesthesiology Phone [phone] 10/11/2016 1:49 PM

## 2016-10-11 NOTE — Telephone Encounter (Signed)
Okay for let her stating that he can stop his Coumadin 5 days prior and then restart the Coumadin 12 hours afterwar surgery.  He is to avoid and stop any other blood thinning products such as fish oil her aspirin a week prior.

## 2016-10-11 NOTE — Telephone Encounter (Signed)
Patient's wife advised

## 2016-10-12 ENCOUNTER — Ambulatory Visit: Payer: Medicare Other

## 2016-10-12 NOTE — H&P (Signed)
PREOPERATIVE H&P  Chief Complaint: Left leg pain  HPI: Tanner Hensley is a 76 y.o. 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 Arthritis    sees Dr. Charlestine Night  . Atrial fibrillation ([place])    (Dr. Phylliss Blakes at [facility] and Dr. Lovena Le at [facility]. [facility] polymyalgia rheumatica (Dr. Charlestine Night)  . CHF (congestive heart failure) (HCC)    NOT SURE WHEN  . Diabetes mellitus type II    DIET CONTROLLED AND LOST 40 LBS  . Dysrhythmia    chronic a=fib with cardiac ablations  . GERD (gastroesophageal reflux disease)   . Gout   . High cholesterol   . Hypertension   . Insomnia   . Neuromuscular disorder ([place])    POLY MYALGIA   RHEUMATICA  . Polymyalgia rheumatica ([place])    sees Dr. Charlestine Night   . Presence of permanent cardiac pacemaker    [employer]  08/2011  . Urticaria    Past Surgical History:  Procedure Laterality Date  . cardiac ablation x two at [facility]     2006, 2008, 2013   BY DR. Fitzgerald  . CARDIAC CATHETERIZATION     has had multiple, but last one 2017  . COLONOSCOPY  07/06/10   per Dr. Oretha Caprice, single poly, repeat in 5 yrs.   Marland Kitchen HERNIA REPAIR     right ing 2011  . left knee arthroscopy  02/03/10   per Dr. Lindwood Qua  . right inguinal herniorrhaphy  11/28/09    per Dr. Donnie Mesa   Social History   Social History  . Marital status: Married    Spouse name: N/A  . Number of children: N/A  . Years of education: N/A   Social History Main Topics  . Smoking status: Never Smoker  . Smokeless tobacco: Never Used  . Alcohol use No  . Drug use: No  . Sexual activity: Not on file    Other Topics Concern  . Not on file   Social History Narrative  . No narrative on file   Family History  Problem Relation Age of Onset  . Coronary artery disease Other   . Hypertension Other   . Heart disease Other    Allergies  Allergen Reactions  . Codeine Rash and Other (See Comments)    Dizziness, low blood pressure and heart rate  . Hydrocodone-Acetaminophen Rash and Other (See Comments)    Dizziness, low blood pressure and heart rate  . Tramadol Nausea And Vomiting  . Lipitor [Atorvastatin] Other (See Comments)    Myalgias   . Robaxin [Methocarbamol] Other (See Comments)    Dropped blood pressure too much  . Zocor [Simvastatin] Other (See Comments)    myalgias   Prior to Admission medications   Medication Sig Start Date End Date Taking? Authorizing Provider  acetaminophen (TYLENOL) 500 MG tablet Take 1,000 mg by mouth 3 (three) times daily.   Yes Historical Provider, MD  alendronate (FOSAMAX) 70 MG tablet TAKE 1 TABLET BY MOUTH  EVERY 7 DAYS. TAKE WITH  FULL GLASS OF WATER Patient taking differently: TAKE 1 TABLET BY MOUTH  EVERY 7 DAYS (FRIDAYS). TAKE  WITH  FULL GLASS OF WATER 01/21/16  Yes Laurey Morale, MD  Ascorbic Acid 500 MG CAPS Take 500 mg by mouth at bedtime.    Yes Historical Provider, MD  aspirin EC 81 MG tablet Take 81 mg by mouth daily.   Yes Historical Provider, MD  calcium citrate-vitamin D (CITRACAL+D) 315-200 MG-UNIT tablet Take 1 tablet by mouth 2 (two) times daily.   Yes Historical Provider, MD  donepezil (ARICEPT) 10 MG tablet Take 10 mg by mouth at bedtime. 08/17/16  Yes Historical Provider, MD  FLAXSEED, LINSEED, PO Take 1 capsule by mouth daily. FLAXSEED OIL (1000 MG)-OMEGA3 (450 MG)   Yes Historical Provider, MD  gabapentin (NEURONTIN) 300 MG capsule Take 1 capsule (300 mg total) by mouth 3 (three) times daily. 09/20/16  Yes Laurey Morale, MD  HYDROmorphone (DILAUDID) 2 MG tablet Take 1 mg by mouth every 4 (four) hours as needed for severe pain.     Yes Historical Provider, MD  lisinopril (PRINIVIL,ZESTRIL) 40 MG tablet TAKE 1 TABLET BY MOUTH  DAILY 08/31/16  Yes Laurey Morale, MD  memantine (NAMENDA) 10 MG tablet TAKE 1 TABLET BY MOUTH TWO  TIMES DAILY 08/31/16  Yes Laurey Morale, MD  metoprolol tartrate (LOPRESSOR) 25 MG tablet Take 25 mg by mouth 2 (two) times daily.   Yes Historical Provider, MD  Multiple Vitamin (MULTIVITAMIN WITH MINERALS) TABS tablet Take 1 tablet by mouth daily. SENIOR MULTIVITAMIN   Yes Historical Provider, MD  senna (SENOKOT) 8.6 MG tablet Take 1 tablet by mouth.    Yes Historical Provider, MD  warfarin (COUMADIN) 5 MG tablet TAKE 1 TABLET BY MOUTH  DAILY 07/14/16  Yes Hali Marry, MD     All other systems have been reviewed and were otherwise negative with the exception of those mentioned in the HPI and as above.  Physical Exam: There were no vitals filed for this visit.  General: Alert, no acute distress Cardiovascular: No pedal edema Respiratory: No cyanosis, no use of accessory musculature Skin: No lesions in the area of chief complaint Neurologic: Sensation intact distally Psychiatric: Patient is competent for consent with normal mood and affect Lymphatic: No axillary or cervical lymphadenopathy  MUSCULOSKELETAL: + SLR on the left  Assessment/Plan: Left sided radiculopathy Plan for Procedure(s): LEFT SIDED LUMBAR 3-4 TRANSFORAMINAL LUMBAR INTEBODY FUSION WITH INSTRUMENTATION AND ALLOGRAFT   Sinclair Ship, MD 10/12/2016 2:13 PM

## 2016-10-13 ENCOUNTER — Inpatient Hospital Stay (HOSPITAL_COMMUNITY): Payer: Medicare Other

## 2016-10-13 ENCOUNTER — Inpatient Hospital Stay (HOSPITAL_COMMUNITY)
Admission: RE | Admit: 2016-10-13 | Discharge: 2016-10-16 | DRG: 454 | Disposition: A | Discharge status: Home Health | Payer: Medicare Other | Source: Ambulatory Visit | Attending: Orthopedic Surgery | Admitting: Orthopedic Surgery

## 2016-10-13 ENCOUNTER — Inpatient Hospital Stay (HOSPITAL_COMMUNITY): Payer: Medicare Other | Admitting: Vascular Surgery

## 2016-10-13 ENCOUNTER — Encounter (HOSPITAL_COMMUNITY)
Admission: RE | Disposition: A | Discharge status: Home Health | Payer: Self-pay | Source: Ambulatory Visit | Attending: Orthopedic Surgery

## 2016-10-13 ENCOUNTER — Encounter (HOSPITAL_COMMUNITY): Payer: Self-pay | Admitting: *Deleted

## 2016-10-13 DIAGNOSIS — Z885 Allergy status to narcotic agent status: Secondary | ICD-10-CM

## 2016-10-13 DIAGNOSIS — Z7982 Long term (current) use of aspirin: Secondary | ICD-10-CM | POA: Diagnosis not present

## 2016-10-13 DIAGNOSIS — I951 Orthostatic hypotension: Secondary | ICD-10-CM | POA: Diagnosis not present

## 2016-10-13 DIAGNOSIS — Z950 Presence of cardiac pacemaker: Secondary | ICD-10-CM

## 2016-10-13 DIAGNOSIS — E119 Type 2 diabetes mellitus without complications: Secondary | ICD-10-CM | POA: Diagnosis present

## 2016-10-13 DIAGNOSIS — I2510 Atherosclerotic heart disease of native coronary artery without angina pectoris: Secondary | ICD-10-CM | POA: Diagnosis present

## 2016-10-13 DIAGNOSIS — M5416 Radiculopathy, lumbar region: Secondary | ICD-10-CM | POA: Diagnosis not present

## 2016-10-13 DIAGNOSIS — I4891 Unspecified atrial fibrillation: Secondary | ICD-10-CM | POA: Diagnosis not present

## 2016-10-13 DIAGNOSIS — Z419 Encounter for procedure for purposes other than remedying health state, unspecified: Secondary | ICD-10-CM

## 2016-10-13 DIAGNOSIS — M5126 Other intervertebral disc displacement, lumbar region: Secondary | ICD-10-CM | POA: Diagnosis not present

## 2016-10-13 DIAGNOSIS — M5116 Intervertebral disc disorders with radiculopathy, lumbar region: Principal | ICD-10-CM | POA: Diagnosis present

## 2016-10-13 DIAGNOSIS — Z981 Arthrodesis status: Secondary | ICD-10-CM | POA: Diagnosis not present

## 2016-10-13 DIAGNOSIS — Z7983 Long term (current) use of bisphosphonates: Secondary | ICD-10-CM

## 2016-10-13 DIAGNOSIS — Z7901 Long term (current) use of anticoagulants: Secondary | ICD-10-CM | POA: Diagnosis not present

## 2016-10-13 DIAGNOSIS — K219 Gastro-esophageal reflux disease without esophagitis: Secondary | ICD-10-CM | POA: Diagnosis present

## 2016-10-13 DIAGNOSIS — I10 Essential (primary) hypertension: Secondary | ICD-10-CM | POA: Diagnosis present

## 2016-10-13 DIAGNOSIS — I110 Hypertensive heart disease with heart failure: Secondary | ICD-10-CM | POA: Diagnosis not present

## 2016-10-13 DIAGNOSIS — R509 Fever, unspecified: Secondary | ICD-10-CM | POA: Diagnosis not present

## 2016-10-13 DIAGNOSIS — M109 Gout, unspecified: Secondary | ICD-10-CM | POA: Diagnosis not present

## 2016-10-13 DIAGNOSIS — Z8249 Family history of ischemic heart disease and other diseases of the circulatory system: Secondary | ICD-10-CM

## 2016-10-13 DIAGNOSIS — R55 Syncope and collapse: Secondary | ICD-10-CM | POA: Diagnosis not present

## 2016-10-13 DIAGNOSIS — I482 Chronic atrial fibrillation: Secondary | ICD-10-CM | POA: Diagnosis not present

## 2016-10-13 DIAGNOSIS — M353 Polymyalgia rheumatica: Secondary | ICD-10-CM | POA: Diagnosis present

## 2016-10-13 DIAGNOSIS — I481 Persistent atrial fibrillation: Secondary | ICD-10-CM | POA: Diagnosis present

## 2016-10-13 DIAGNOSIS — R29898 Other symptoms and signs involving the musculoskeletal system: Secondary | ICD-10-CM

## 2016-10-13 DIAGNOSIS — M5410 Radiculopathy, site unspecified: Secondary | ICD-10-CM | POA: Diagnosis present

## 2016-10-13 DIAGNOSIS — I509 Heart failure, unspecified: Secondary | ICD-10-CM | POA: Diagnosis not present

## 2016-10-13 DIAGNOSIS — I77819 Aortic ectasia, unspecified site: Secondary | ICD-10-CM | POA: Diagnosis not present

## 2016-10-13 DIAGNOSIS — I6621 Occlusion and stenosis of right posterior cerebral artery: Secondary | ICD-10-CM | POA: Diagnosis present

## 2016-10-13 DIAGNOSIS — Z79899 Other long term (current) drug therapy: Secondary | ICD-10-CM

## 2016-10-13 DIAGNOSIS — G8220 Paraplegia, unspecified: Secondary | ICD-10-CM | POA: Diagnosis not present

## 2016-10-13 DIAGNOSIS — R531 Weakness: Secondary | ICD-10-CM

## 2016-10-13 DIAGNOSIS — Z7401 Bed confinement status: Secondary | ICD-10-CM

## 2016-10-13 DIAGNOSIS — Z888 Allergy status to other drugs, medicaments and biological substances status: Secondary | ICD-10-CM

## 2016-10-13 DIAGNOSIS — E2740 Unspecified adrenocortical insufficiency: Secondary | ICD-10-CM | POA: Diagnosis not present

## 2016-10-13 DIAGNOSIS — M5136 Other intervertebral disc degeneration, lumbar region: Secondary | ICD-10-CM | POA: Diagnosis not present

## 2016-10-13 DIAGNOSIS — J9811 Atelectasis: Secondary | ICD-10-CM | POA: Diagnosis not present

## 2016-10-13 DIAGNOSIS — I6501 Occlusion and stenosis of right vertebral artery: Secondary | ICD-10-CM | POA: Diagnosis not present

## 2016-10-13 DIAGNOSIS — Z955 Presence of coronary angioplasty implant and graft: Secondary | ICD-10-CM | POA: Diagnosis not present

## 2016-10-13 DIAGNOSIS — E7800 Pure hypercholesterolemia, unspecified: Secondary | ICD-10-CM | POA: Diagnosis present

## 2016-10-13 DIAGNOSIS — M4326 Fusion of spine, lumbar region: Secondary | ICD-10-CM | POA: Diagnosis not present

## 2016-10-13 HISTORY — DX: Atherosclerotic heart disease of native coronary artery without angina pectoris: I25.10

## 2016-10-13 HISTORY — PX: SPINE SURGERY: SHX786

## 2016-10-13 LAB — URINALYSIS, ROUTINE W REFLEX MICROSCOPIC
BACTERIA UA: NONE SEEN
BILIRUBIN URINE: NEGATIVE
Glucose, UA: NEGATIVE mg/dL
Ketones, ur: NEGATIVE mg/dL
LEUKOCYTES UA: NEGATIVE
NITRITE: NEGATIVE
PROTEIN: 30 mg/dL — AB
SPECIFIC GRAVITY, URINE: 1.017 (ref 1.005–1.030)
Squamous Epithelial / LPF: NONE SEEN
pH: 5 (ref 5.0–8.0)

## 2016-10-13 LAB — COMPREHENSIVE METABOLIC PANEL
ALBUMIN: 3.5 g/dL (ref 3.5–5.0)
ALT: 17 U/L (ref 17–63)
AST: 28 U/L (ref 15–41)
Alkaline Phosphatase: 57 U/L (ref 38–126)
Anion gap: 7 (ref 5–15)
BUN: 14 mg/dL (ref 6–20)
CHLORIDE: 102 mmol/L (ref 101–111)
CO2: 28 mmol/L (ref 22–32)
Calcium: 9 mg/dL (ref 8.9–10.3)
Creatinine, Ser: 0.94 mg/dL (ref 0.61–1.24)
GFR calc Af Amer: >60 mL/min (ref >60)
GFR calc non Af Amer: >60 mL/min (ref >60)
GLUCOSE: 148 mg/dL — AB (ref 65–99)
POTASSIUM: 4.2 mmol/L (ref 3.5–5.1)
Sodium: 137 mmol/L (ref 135–145)
Total Bilirubin: 0.8 mg/dL (ref 0.3–1.2)
Total Protein: 5.7 g/dL — ABNORMAL LOW (ref 6.5–8.1)

## 2016-10-13 LAB — PROTIME-INR
INR: 1.23
Prothrombin Time: 15.6 seconds — ABNORMAL HIGH (ref 11.4–15.2)

## 2016-10-13 LAB — GLUCOSE, CAPILLARY
GLUCOSE-CAPILLARY: 91 mg/dL (ref 65–99)
GLUCOSE-CAPILLARY: 81 mg/dL (ref 65–99)
Glucose-Capillary: 132 mg/dL — ABNORMAL HIGH (ref 65–99)

## 2016-10-13 LAB — CBC WITH DIFFERENTIAL/PLATELET
Basophils Absolute: 0 10*3/uL (ref 0.0–0.1)
Basophils Relative: 0 %
EOS ABS: 0 10*3/uL (ref 0.0–0.7)
Eosinophils Relative: 0 %
HEMATOCRIT: 34.4 % — AB (ref 39.0–52.0)
HEMOGLOBIN: 11.7 g/dL — AB (ref 13.0–17.0)
LYMPHS PCT: 4 %
Lymphs Abs: 0.5 10*3/uL — ABNORMAL LOW (ref 0.7–4.0)
MCH: 34.2 pg — AB (ref 26.0–34.0)
MCHC: 34 g/dL (ref 30.0–36.0)
MCV: 100.6 fL — ABNORMAL HIGH (ref 78.0–100.0)
MONOS PCT: 2 %
Monocytes Absolute: 0.3 10*3/uL (ref 0.1–1.0)
NEUTROS PCT: 93 %
Neutro Abs: 10.2 10*3/uL — ABNORMAL HIGH (ref 1.7–7.7)
PLATELETS: 107 10*3/uL — AB (ref 150–400)
RBC: 3.42 MIL/uL — AB (ref 4.22–5.81)
RDW: 12.9 % (ref 11.5–15.5)
WBC: 11 10*3/uL — AB (ref 4.0–10.5)

## 2016-10-13 LAB — LACTIC ACID, PLASMA: Lactic Acid, Venous: 1.6 mmol/L (ref 0.5–1.9)

## 2016-10-13 LAB — TROPONIN I: Troponin I: <0.03 ng/mL (ref <0.03)

## 2016-10-13 LAB — D-DIMER, QUANTITATIVE (NOT AT ARMC): D DIMER QUANT: 1.35 ug{FEU}/mL — AB (ref 0.00–0.50)

## 2016-10-13 LAB — CORTISOL: Cortisol, Plasma: 6.8 ug/dL

## 2016-10-13 SURGERY — POSTERIOR LUMBAR FUSION 1 LEVEL
Anesthesia: General | Site: Spine Lumbar | Laterality: Left

## 2016-10-13 MED ORDER — FENTANYL CITRATE (PF) 100 MCG/2ML IJ SOLN
INTRAMUSCULAR | Status: AC
  Filled 2016-10-13: qty 2

## 2016-10-13 MED ORDER — BUPIVACAINE LIPOSOME 1.3 % IJ SUSP
20.0000 mL | INTRAMUSCULAR | Status: AC
  Administered 2016-10-13: 20 mL
  Filled 2016-10-13: qty 20

## 2016-10-13 MED ORDER — METHYLENE BLUE 0.5 % INJ SOLN
INTRAVENOUS | Status: DC | PRN
  Administered 2016-10-13: .2 mL via INTRADERMAL

## 2016-10-13 MED ORDER — PHENYLEPHRINE HCL 10 MG/ML IJ SOLN
INTRAMUSCULAR | Status: DC | PRN
  Administered 2016-10-13 (×2): 80 ug via INTRAVENOUS

## 2016-10-13 MED ORDER — FLEET ENEMA 7-19 GM/118ML RE ENEM: 1.0000 | ENEMA | Freq: Once | RECTAL | Status: DC | PRN

## 2016-10-13 MED ORDER — ONDANSETRON HCL 4 MG/2ML IJ SOLN
4.0000 mg | INTRAMUSCULAR | Status: DC | PRN
Start: 2016-10-13 — End: 2016-10-16

## 2016-10-13 MED ORDER — ROCURONIUM BROMIDE 100 MG/10ML IV SOLN
INTRAVENOUS | Status: DC | PRN
  Administered 2016-10-13: 20 mg via INTRAVENOUS
  Administered 2016-10-13 (×2): 10 mg via INTRAVENOUS
  Administered 2016-10-13: 50 mg via INTRAVENOUS

## 2016-10-13 MED ORDER — SODIUM CHLORIDE 0.9 % IV SOLN: 250.0000 mL | INTRAVENOUS | Status: DC

## 2016-10-13 MED ORDER — HYDROMORPHONE HCL 1 MG/ML IJ SOLN
INTRAMUSCULAR | Status: AC
  Filled 2016-10-13: qty 0.5

## 2016-10-13 MED ORDER — THROMBIN 20000 UNITS EX KIT
PACK | CUTANEOUS | Status: DC | PRN
  Administered 2016-10-13: 20000 [IU] via TOPICAL

## 2016-10-13 MED ORDER — BUPIVACAINE HCL (PF) 0.25 % IJ SOLN
INTRAMUSCULAR | Status: AC
  Filled 2016-10-13: qty 30

## 2016-10-13 MED ORDER — EPINEPHRINE PF 1 MG/ML IJ SOLN
INTRAMUSCULAR | Status: AC
  Filled 2016-10-13: qty 1

## 2016-10-13 MED ORDER — ACETAMINOPHEN 325 MG PO TABS
650.0000 mg | ORAL_TABLET | ORAL | Status: DC | PRN
  Administered 2016-10-14 – 2016-10-15 (×3): 650 mg via ORAL
  Filled 2016-10-13 (×4): qty 2

## 2016-10-13 MED ORDER — THROMBIN 20000 UNITS EX SOLR
CUTANEOUS | Status: AC
  Filled 2016-10-13: qty 20000

## 2016-10-13 MED ORDER — LISINOPRIL 20 MG PO TABS: 40.0000 mg | ORAL_TABLET | Freq: Every day | ORAL | Status: DC

## 2016-10-13 MED ORDER — ONDANSETRON HCL 4 MG/2ML IJ SOLN
INTRAMUSCULAR | Status: DC | PRN
  Administered 2016-10-13: 4 mg via INTRAVENOUS

## 2016-10-13 MED ORDER — HYDROMORPHONE HCL 2 MG PO TABS
1.0000 mg | ORAL_TABLET | ORAL | Status: DC | PRN
  Administered 2016-10-13 – 2016-10-14 (×2): 2 mg via ORAL
  Administered 2016-10-14 (×2): 1 mg via ORAL
  Administered 2016-10-15: 2 mg via ORAL
  Administered 2016-10-15 (×2): 1 mg via ORAL
  Filled 2016-10-13 (×7): qty 1

## 2016-10-13 MED ORDER — PROPOFOL 10 MG/ML IV BOLUS
INTRAVENOUS | Status: DC | PRN
  Administered 2016-10-13: 20 mg via INTRAVENOUS
  Administered 2016-10-13: 100 mg via INTRAVENOUS

## 2016-10-13 MED ORDER — BUPIVACAINE-EPINEPHRINE 0.25% -1:200000 IJ SOLN
INTRAMUSCULAR | Status: DC | PRN
  Administered 2016-10-13: 20 mL

## 2016-10-13 MED ORDER — CEFAZOLIN SODIUM-DEXTROSE 2-4 GM/100ML-% IV SOLN
2.0000 g | Freq: Three times a day (TID) | INTRAVENOUS | Status: AC
Start: 2016-10-13 — End: 2016-10-14
  Administered 2016-10-13 – 2016-10-14 (×2): 2 g via INTRAVENOUS
  Filled 2016-10-13 (×2): qty 100

## 2016-10-13 MED ORDER — SENNOSIDES-DOCUSATE SODIUM 8.6-50 MG PO TABS
1.0000 | ORAL_TABLET | Freq: Every evening | ORAL | Status: DC | PRN
Start: 2016-10-13 — End: 2016-10-16

## 2016-10-13 MED ORDER — BISACODYL 5 MG PO TBEC: 5.0000 mg | DELAYED_RELEASE_TABLET | Freq: Every day | ORAL | Status: DC | PRN

## 2016-10-13 MED ORDER — SODIUM CHLORIDE 0.9% FLUSH
3.0000 mL | Freq: Two times a day (BID) | INTRAVENOUS | Status: DC
  Administered 2016-10-13 – 2016-10-15 (×4): 3 mL via INTRAVENOUS

## 2016-10-13 MED ORDER — FENTANYL CITRATE (PF) 100 MCG/2ML IJ SOLN
INTRAMUSCULAR | Status: DC | PRN
  Administered 2016-10-13: 100 ug via INTRAVENOUS
  Administered 2016-10-13 (×2): 50 ug via INTRAVENOUS

## 2016-10-13 MED ORDER — GABAPENTIN 300 MG PO CAPS
300.0000 mg | ORAL_CAPSULE | Freq: Three times a day (TID) | ORAL | Status: DC
  Administered 2016-10-13 – 2016-10-16 (×8): 300 mg via ORAL
  Filled 2016-10-13 (×8): qty 1

## 2016-10-13 MED ORDER — CEFAZOLIN SODIUM-DEXTROSE 2-4 GM/100ML-% IV SOLN
2.0000 g | INTRAVENOUS | Status: AC
  Administered 2016-10-13: 2 g via INTRAVENOUS
  Filled 2016-10-13: qty 100

## 2016-10-13 MED ORDER — LACTATED RINGERS IV SOLN
INTRAVENOUS | Status: DC
  Administered 2016-10-13 (×2): via INTRAVENOUS

## 2016-10-13 MED ORDER — ZOLPIDEM TARTRATE 5 MG PO TABS: 5.0000 mg | ORAL_TABLET | Freq: Every evening | ORAL | Status: DC | PRN

## 2016-10-13 MED ORDER — PHENOL 1.4 % MT LIQD: 1.0000 | OROMUCOSAL | Status: DC | PRN

## 2016-10-13 MED ORDER — POTASSIUM CHLORIDE IN NACL 20-0.9 MEQ/L-% IV SOLN
INTRAVENOUS | Status: DC
  Administered 2016-10-13: 22:00:00 via INTRAVENOUS
  Filled 2016-10-13: qty 1000

## 2016-10-13 MED ORDER — PROMETHAZINE HCL 25 MG/ML IJ SOLN: 6.2500 mg | INTRAMUSCULAR | Status: DC | PRN

## 2016-10-13 MED ORDER — MIDAZOLAM HCL 2 MG/2ML IJ SOLN
INTRAMUSCULAR | Status: AC
  Filled 2016-10-13: qty 2

## 2016-10-13 MED ORDER — ONDANSETRON HCL 4 MG/2ML IJ SOLN
INTRAMUSCULAR | Status: AC
  Filled 2016-10-13: qty 2

## 2016-10-13 MED ORDER — HYDROMORPHONE HCL 1 MG/ML IJ SOLN
0.5000 mg | INTRAMUSCULAR | Status: DC | PRN
  Filled 2016-10-13: qty 1

## 2016-10-13 MED ORDER — PHENYLEPHRINE HCL 10 MG/ML IJ SOLN
INTRAVENOUS | Status: DC | PRN
  Administered 2016-10-13: 25 ug/min via INTRAVENOUS

## 2016-10-13 MED ORDER — MEMANTINE HCL 5 MG PO TABS
10.0000 mg | ORAL_TABLET | Freq: Two times a day (BID) | ORAL | Status: DC
  Administered 2016-10-13 – 2016-10-16 (×3): 10 mg via ORAL
  Filled 2016-10-13: qty 1
  Filled 2016-10-13 (×5): qty 2

## 2016-10-13 MED ORDER — LIDOCAINE HCL (CARDIAC) 20 MG/ML IV SOLN
INTRAVENOUS | Status: DC | PRN
  Administered 2016-10-13: 100 mg via INTRAVENOUS

## 2016-10-13 MED ORDER — DOCUSATE SODIUM 100 MG PO CAPS
100.0000 mg | ORAL_CAPSULE | Freq: Two times a day (BID) | ORAL | Status: DC
  Administered 2016-10-13 – 2016-10-16 (×6): 100 mg via ORAL
  Filled 2016-10-13 (×6): qty 1

## 2016-10-13 MED ORDER — POVIDONE-IODINE 7.5 % EX SOLN
Freq: Once | CUTANEOUS | Status: DC
Start: 2016-10-13 — End: 2016-10-13

## 2016-10-13 MED ORDER — SCOPOLAMINE 1 MG/3DAYS TD PT72
1.0000 | MEDICATED_PATCH | TRANSDERMAL | Status: DC
  Administered 2016-10-13: 1.5 mg via TRANSDERMAL
  Filled 2016-10-13: qty 1

## 2016-10-13 MED ORDER — HYDROMORPHONE HCL 1 MG/ML IJ SOLN
0.2500 mg | INTRAMUSCULAR | Status: DC | PRN
  Administered 2016-10-13: 0.25 mg via INTRAVENOUS

## 2016-10-13 MED ORDER — SUGAMMADEX SODIUM 200 MG/2ML IV SOLN
INTRAVENOUS | Status: DC | PRN
  Administered 2016-10-13: 150 mg via INTRAVENOUS

## 2016-10-13 MED ORDER — ADULT MULTIVITAMIN W/MINERALS CH
1.0000 | ORAL_TABLET | Freq: Every day | ORAL | Status: DC
  Administered 2016-10-14 – 2016-10-16 (×3): 1 via ORAL
  Filled 2016-10-13 (×3): qty 1

## 2016-10-13 MED ORDER — DIAZEPAM 5 MG PO TABS
5.0000 mg | ORAL_TABLET | Freq: Four times a day (QID) | ORAL | Status: DC | PRN
  Administered 2016-10-13: 5 mg via ORAL
  Filled 2016-10-13: qty 1

## 2016-10-13 MED ORDER — METHYLENE BLUE 0.5 % INJ SOLN
INTRAVENOUS | Status: AC
  Filled 2016-10-13: qty 10

## 2016-10-13 MED ORDER — ALUM & MAG HYDROXIDE-SIMETH 200-200-20 MG/5ML PO SUSP: 30.0000 mL | Freq: Four times a day (QID) | ORAL | Status: DC | PRN

## 2016-10-13 MED ORDER — PROPOFOL 500 MG/50ML IV EMUL
INTRAVENOUS | Status: DC | PRN
  Administered 2016-10-13: 75 ug/kg/min via INTRAVENOUS

## 2016-10-13 MED ORDER — DONEPEZIL HCL 10 MG PO TABS
10.0000 mg | ORAL_TABLET | Freq: Every day | ORAL | Status: DC
  Administered 2016-10-13 – 2016-10-15 (×3): 10 mg via ORAL
  Filled 2016-10-13 (×4): qty 1

## 2016-10-13 MED ORDER — CALCIUM CITRATE-VITAMIN D 500-400 MG-UNIT PO CHEW
1.0000 | CHEWABLE_TABLET | Freq: Two times a day (BID) | ORAL | Status: DC
  Filled 2016-10-13: qty 1

## 2016-10-13 MED ORDER — CALCIUM CITRATE-VITAMIN D 315-200 MG-UNIT PO TABS: 1.0000 | ORAL_TABLET | Freq: Two times a day (BID) | ORAL | Status: DC

## 2016-10-13 MED ORDER — DEXAMETHASONE SODIUM PHOSPHATE 10 MG/ML IJ SOLN
INTRAMUSCULAR | Status: DC | PRN
  Administered 2016-10-13: 5 mg via INTRAVENOUS

## 2016-10-13 MED ORDER — SENNA 8.6 MG PO TABS
1.0000 | ORAL_TABLET | Freq: Every evening | ORAL | Status: DC | PRN
Start: 2016-10-13 — End: 2016-10-16

## 2016-10-13 MED ORDER — MENTHOL 3 MG MT LOZG: 1.0000 | LOZENGE | OROMUCOSAL | Status: DC | PRN

## 2016-10-13 MED ORDER — METOPROLOL TARTRATE 25 MG PO TABS
25.0000 mg | ORAL_TABLET | Freq: Two times a day (BID) | ORAL | Status: DC
  Administered 2016-10-13: 25 mg via ORAL
  Filled 2016-10-13: qty 1

## 2016-10-13 MED ORDER — SODIUM CHLORIDE 0.9% FLUSH: 3.0000 mL | INTRAVENOUS | Status: DC | PRN

## 2016-10-13 MED ORDER — VITAMIN C 500 MG PO TABS
500.0000 mg | ORAL_TABLET | Freq: Every day | ORAL | Status: DC
  Administered 2016-10-13 – 2016-10-15 (×3): 500 mg via ORAL
  Filled 2016-10-13 (×4): qty 1

## 2016-10-13 MED ORDER — ACETAMINOPHEN 650 MG RE SUPP: 650.0000 mg | RECTAL | Status: DC | PRN

## 2016-10-13 MED ORDER — SUGAMMADEX SODIUM 200 MG/2ML IV SOLN
INTRAVENOUS | Status: AC
  Filled 2016-10-13: qty 2

## 2016-10-13 MED ORDER — PROPOFOL 1000 MG/100ML IV EMUL
INTRAVENOUS | Status: AC
  Filled 2016-10-13: qty 200

## 2016-10-13 MED ORDER — DEXAMETHASONE SODIUM PHOSPHATE 10 MG/ML IJ SOLN
INTRAMUSCULAR | Status: AC
  Filled 2016-10-13: qty 1

## 2016-10-13 MED ORDER — CALCIUM CARBONATE-VITAMIN D 500-200 MG-UNIT PO TABS
1.0000 | ORAL_TABLET | Freq: Two times a day (BID) | ORAL | Status: DC
  Administered 2016-10-13 – 2016-10-16 (×6): 1 via ORAL
  Filled 2016-10-13 (×6): qty 1

## 2016-10-13 MED ORDER — 0.9 % SODIUM CHLORIDE (POUR BTL) OPTIME
TOPICAL | Status: DC | PRN
  Administered 2016-10-13 (×2): 1000 mL

## 2016-10-13 SURGICAL SUPPLY — 92 items
BENZOIN TINCTURE PRP APPL 2/3 (GAUZE/BANDAGES/DRESSINGS) ×3 IMPLANT
BIT DRILL 3.2 (BIT) ×2
BIT DRILL 65X3.2XQC STP NS (BIT) ×1 IMPLANT
BIT DRL 65X3.2XQC STP NS (BIT) ×1
BLADE SURG ROTATE 9660 (MISCELLANEOUS) IMPLANT
BONE VIVIGEN FORMABLE 10CC (Bone Implant) ×3 IMPLANT
BUR PRESCISION 1.7 ELITE (BURR) ×3 IMPLANT
BUR ROUND PRECISION 4.0 (BURR) IMPLANT
BUR ROUND PRECISION 4.0MM (BURR)
BUR SABER RD CUTTING 3.0 (BURR) IMPLANT
BUR SABER RD CUTTING 3.0MM (BURR)
CAGE CONCORDE BULLET 9X11X27 (Cage) ×1 IMPLANT
CAGE CONCORDE BULLET 9X11X27MM (Cage) ×1 IMPLANT
CAGE SPNL 5D BLT NOSE 27X9X11 (Cage) ×1 IMPLANT
CARTRIDGE OIL MAESTRO DRILL (MISCELLANEOUS) ×1 IMPLANT
CLOSURE STERI-STRIP 1/2X4 (GAUZE/BANDAGES/DRESSINGS) ×1
CLOSURE WOUND 1/2 X4 (GAUZE/BANDAGES/DRESSINGS) ×2
CLSR STERI-STRIP ANTIMIC 1/2X4 (GAUZE/BANDAGES/DRESSINGS) ×2 IMPLANT
CONT SPEC STER OR (MISCELLANEOUS) ×3 IMPLANT
COVER MAYO STAND STRL (DRAPES) ×6 IMPLANT
COVER SURGICAL LIGHT HANDLE (MISCELLANEOUS) ×3 IMPLANT
DIFFUSER DRILL AIR PNEUMATIC (MISCELLANEOUS) ×3 IMPLANT
DRAIN CHANNEL 15F RND FF W/TCR (WOUND CARE) IMPLANT
DRAPE C-ARM 42X72 X-RAY (DRAPES) ×3 IMPLANT
DRAPE C-ARMOR (DRAPES) IMPLANT
DRAPE POUCH INSTRU U-SHP 10X18 (DRAPES) ×3 IMPLANT
DRAPE SURG 17X23 STRL (DRAPES) ×12 IMPLANT
DURAPREP 26ML APPLICATOR (WOUND CARE) ×3 IMPLANT
ELECT BLADE 4.0 EZ CLEAN MEGAD (MISCELLANEOUS) ×3
ELECT CAUTERY BLADE 6.4 (BLADE) ×3 IMPLANT
ELECT REM PT RETURN 9FT ADLT (ELECTROSURGICAL) ×3
ELECTRODE BLDE 4.0 EZ CLN MEGD (MISCELLANEOUS) ×1 IMPLANT
ELECTRODE REM PT RTRN 9FT ADLT (ELECTROSURGICAL) ×1 IMPLANT
EVACUATOR SILICONE 100CC (DRAIN) IMPLANT
FEE INTRAOP MONITOR IMPULS NCS (MISCELLANEOUS) ×1 IMPLANT
GAUZE SPONGE 4X4 12PLY STRL (GAUZE/BANDAGES/DRESSINGS) ×3 IMPLANT
GAUZE SPONGE 4X4 16PLY XRAY LF (GAUZE/BANDAGES/DRESSINGS) ×3 IMPLANT
GLOVE BIO SURGEON STRL SZ7 (GLOVE) ×3 IMPLANT
GLOVE BIO SURGEON STRL SZ8 (GLOVE) ×3 IMPLANT
GLOVE BIOGEL PI IND STRL 7.0 (GLOVE) ×1 IMPLANT
GLOVE BIOGEL PI IND STRL 8 (GLOVE) ×1 IMPLANT
GLOVE BIOGEL PI INDICATOR 7.0 (GLOVE) ×2
GLOVE BIOGEL PI INDICATOR 8 (GLOVE) ×2
GOWN STRL REUS W/ TWL LRG LVL3 (GOWN DISPOSABLE) ×2 IMPLANT
GOWN STRL REUS W/ TWL XL LVL3 (GOWN DISPOSABLE) ×1 IMPLANT
GOWN STRL REUS W/TWL LRG LVL3 (GOWN DISPOSABLE) ×4
GOWN STRL REUS W/TWL XL LVL3 (GOWN DISPOSABLE) ×2
INTRAOP MONITOR FEE IMPULS NCS (MISCELLANEOUS) ×1
INTRAOP MONITOR FEE IMPULSE (MISCELLANEOUS) ×2
IV CATH 14GX2 1/4 (CATHETERS) ×3 IMPLANT
KIT BASIN OR (CUSTOM PROCEDURE TRAY) ×3 IMPLANT
KIT POSITION SURG JACKSON T1 (MISCELLANEOUS) ×3 IMPLANT
KIT ROOM TURNOVER OR (KITS) ×3 IMPLANT
MARKER SKIN DUAL TIP RULER LAB (MISCELLANEOUS) ×3 IMPLANT
NDL SAFETY ECLIPSE 18X1.5 (NEEDLE) ×1 IMPLANT
NEEDLE 22X1 1/2 (OR ONLY) (NEEDLE) ×3 IMPLANT
NEEDLE HYPO 18GX1.5 SHARP (NEEDLE) ×2
NEEDLE HYPO 25GX1X1/2 BEV (NEEDLE) ×3 IMPLANT
NEEDLE SPNL 18GX3.5 QUINCKE PK (NEEDLE) ×6 IMPLANT
NS IRRIG 1000ML POUR BTL (IV SOLUTION) ×3 IMPLANT
OIL CARTRIDGE MAESTRO DRILL (MISCELLANEOUS) ×3
PACK LAMINECTOMY ORTHO (CUSTOM PROCEDURE TRAY) ×3 IMPLANT
PACK UNIVERSAL I (CUSTOM PROCEDURE TRAY) ×3 IMPLANT
PAD ARMBOARD 7.5X6 YLW CONV (MISCELLANEOUS) ×6 IMPLANT
PATTIES SURGICAL .5 X1 (DISPOSABLE) ×3 IMPLANT
PATTIES SURGICAL .5X1.5 (GAUZE/BANDAGES/DRESSINGS) ×3 IMPLANT
PROBE PEDCLE PROBE MAGSTM DISP (MISCELLANEOUS) ×3 IMPLANT
ROD PRE BENT EXP 40MM (Rod) ×3 IMPLANT
ROD PRE LORDOSED 5.5X45 (Rod) ×3 IMPLANT
SCREW SET SINGLE INNER (Screw) ×12 IMPLANT
SCREW VIPER CORT FIX 6X35 (Screw) ×12 IMPLANT
SPONGE GAUZE 4X4 12PLY STER LF (GAUZE/BANDAGES/DRESSINGS) ×3 IMPLANT
SPONGE INTESTINAL PEANUT (DISPOSABLE) ×3 IMPLANT
SPONGE SURGIFOAM ABS GEL 100 (HEMOSTASIS) ×3 IMPLANT
STRIP CLOSURE SKIN 1/2X4 (GAUZE/BANDAGES/DRESSINGS) ×4 IMPLANT
SURGIFLO W/THROMBIN 8M KIT (HEMOSTASIS) IMPLANT
SUT MNCRL AB 4-0 PS2 18 (SUTURE) ×3 IMPLANT
SUT VIC AB 0 CT1 18XCR BRD 8 (SUTURE) ×1 IMPLANT
SUT VIC AB 0 CT1 8-18 (SUTURE) ×2
SUT VIC AB 1 CT1 18XCR BRD 8 (SUTURE) ×1 IMPLANT
SUT VIC AB 1 CT1 8-18 (SUTURE) ×2
SUT VIC AB 2-0 CT2 18 VCP726D (SUTURE) ×3 IMPLANT
SYR 20CC LL (SYRINGE) ×3 IMPLANT
SYR BULB IRRIGATION 50ML (SYRINGE) ×3 IMPLANT
SYR CONTROL 10ML LL (SYRINGE) ×6 IMPLANT
SYR TB 1ML LUER SLIP (SYRINGE) ×3 IMPLANT
TAPE CLOTH SURG 6X10 WHT LF (GAUZE/BANDAGES/DRESSINGS) ×3 IMPLANT
TOWEL OR 17X24 6PK STRL BLUE (TOWEL DISPOSABLE) ×3 IMPLANT
TOWEL OR 17X26 10 PK STRL BLUE (TOWEL DISPOSABLE) ×3 IMPLANT
TRAY FOLEY CATH 16FRSI W/METER (SET/KITS/TRAYS/PACK) ×3 IMPLANT
WATER STERILE IRR 1000ML POUR (IV SOLUTION) ×3 IMPLANT
YANKAUER SUCT BULB TIP NO VENT (SUCTIONS) ×3 IMPLANT

## 2016-10-13 NOTE — Anesthesia Preprocedure Evaluation (Addendum)
Anesthesia Evaluation  Patient identified by MRN, date of birth, ID band Patient awake    Reviewed: Allergy & Precautions, NPO status , Patient's Chart, lab work & pertinent test results  History of Anesthesia Complications Negative for: history of anesthetic complications  Airway Mallampati: II  TM Distance: >3 FB Neck ROM: Full    Dental no notable dental hx. (+) Dental Advisory Given, Edentulous Upper   Pulmonary neg pulmonary ROS,    Pulmonary exam normal        Cardiovascular hypertension, Normal cardiovascular exam+ dysrhythmias Atrial Fibrillation      Neuro/Psych negative psych ROS   GI/Hepatic Neg liver ROS, GERD  ,  Endo/Other  diabetes  Renal/GU negative Renal ROS     Musculoskeletal   Abdominal   Peds  Hematology   Anesthesia Other Findings   Reproductive/Obstetrics                            Anesthesia Physical Anesthesia Plan  ASA: III  Anesthesia Plan: General   Post-op Pain Management:    Induction: Intravenous  Airway Management Planned: Oral ETT  Additional Equipment:   Intra-op Plan:   Post-operative Plan: Extubation in OR  Informed Consent: I have reviewed the patients History and Physical, chart, labs and discussed the procedure including the risks, benefits and alternatives for the proposed anesthesia with the patient or authorized representative who has indicated his/her understanding and acceptance.   Dental advisory given  Plan Discussed with: CRNA, Anesthesiologist and Surgeon  Anesthesia Plan Comments:         Anesthesia Quick Evaluation

## 2016-10-13 NOTE — Progress Notes (Signed)
Pt admitted from PACU and Pt ambulated to the bathroom with assist. When Pt attempted to get up he was unable to stand on his legs. Pt seemed to have an episode of syncope. Pt was lifted back to bed by staff. Vitals were taken. HR was tachy. CBG was 81. Pt is unable to move his lower extremities, other than wiggling his toes. RR nurse was called to check Pt. MD notified and orders were given. Medicine will come see the Pt. Pt will be transferred to 5C17 to a telemetry bed. Will continue to monitor Pt. Holli Humbles, RN

## 2016-10-13 NOTE — Transfer of Care (Signed)
Immediate Anesthesia Transfer of Care Note  Patient: Tanner Hensley  Procedure(s) Performed: Procedure(s) with comments: LEFT SIDED LUMBAR 3-4 TRANSFORAMINAL LUMBAR INTEBODY FUSION WITH INSTRUMENTATION AND ALLOGRAFT (Left) - LEFT SIDED LUMBAR 3-4 TRANSFORAMINAL LUMBAR INTEBODY FUSION WITH INSTRUMENTATION AND ALLOGRAFT  Patient Location: PACU  Anesthesia Type:General  Level of Consciousness: awake, alert  and oriented  Airway & Oxygen Therapy: Patient Spontanous Breathing and Patient connected to nasal cannula oxygen  Post-op Assessment: Report given to RN and Post -op Vital signs reviewed and stable  Post vital signs: Reviewed and stable  Last Vitals:  Vitals:   10/13/16 0941  BP: (!) 167/90  Pulse: 79  Resp: 18  Temp: 37 C    Last Pain:  Vitals:   10/13/16 0941  TempSrc: Oral         Complications: No apparent anesthesia complications

## 2016-10-13 NOTE — Consult Note (Signed)
NEURO HOSPITALIST CONSULT NOTE   Requestig physician: Dr. Lynann Bologna  Reason for Consult: Bilateral lower extremity weakness  History obtained from:  Patient and Chart     HPI:                                                                                                                                          Tanner Hensley is an 76 y.o. male who presented with 8 week history of left lower extremity pain. Pain was so severe that he spent much of the past 8 weeks bedbound. He was unable to obtain L-spine MRI due to pacemaker. 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 He had failed multiple forms of conservative care and continued to have pain, warranting surgery. He had low back surgery for radiculopathy on Wednesday. Following surgery, he ambulated to bathroom to urinate, began to feel dizzy and had an episode of syncope. Following this he was unable to move his legs, so he was lifted and brought back to his bed where the weakness continued. He states he was completely aware after the syncope spell, could not move his legs at all except for minimal toe wiggling bilaterally but had normal sensation throughout both legs and in the saddle region, without incontinence. Hospitalist noted on examination that he had intact reflexes and sensation. Gradually, the patient's motor function returned to baseline. He states that left leg pain somewhat limits his movement but that his strength has fully recovered since the initial weakness following his syncopal spell.      Past Medical History:  Diagnosis Date  . Arthritis    sees Dr. Charlestine Night  . Atrial fibrillation ([place])    (Dr. Phylliss Blakes at [facility] and Dr. Lovena Le at [facility]. [facility] to [facility] polymyalgia rheumatica (Dr. Charlestine Night)  . CHF (congestive heart failure) (HCC)    NOT SURE WHEN  . Diabetes mellitus type II    DIET CONTROLLED AND LOST 40 LBS  .  Dysrhythmia    chronic a=fib with cardiac ablations  . GERD (gastroesophageal reflux disease)   . Gout   . High cholesterol   . Hypertension   . Insomnia   . Neuromuscular disorder ([place])    POLY MYALGIA   RHEUMATICA  . Polymyalgia rheumatica ([place])    sees Dr. Charlestine Night   . Presence of permanent cardiac pacemaker    [employer]  08/2011  . Urticaria     Past Surgical History:  Procedure Laterality Date  . cardiac ablation x two at [facility]     2006, 2008, 2013   BY DR. Fitzgerald  . CARDIAC CATHETERIZATION     has had multiple, but last one 2017  . COLONOSCOPY  07/06/10  per Dr. Oretha Caprice, single poly, repeat in 5 yrs.   Marland Kitchen HERNIA REPAIR     right ing 2011  . left knee arthroscopy  02/03/10   per Dr. Lindwood Qua  . right inguinal herniorrhaphy  11/28/09    per Dr. Donnie Mesa    Family History  Problem Relation Age of Onset  . Coronary artery disease Other   . Hypertension Other   . Heart disease Other    Social History:  reports that he has never smoked. He has never used smokeless tobacco. He reports that he does not drink alcohol or use drugs.  Allergies  Allergen Reactions  . Codeine Rash and Other (See Comments)    Dizziness, low blood pressure and heart rate  . Hydrocodone-Acetaminophen Rash and Other (See Comments)    Dizziness, low blood pressure and heart rate  . Tramadol Nausea And Vomiting  . Lipitor [Atorvastatin] Other (See Comments)    Myalgias   . Robaxin [Methocarbamol] Other (See Comments)    Dropped blood pressure too much  . Zocor [Simvastatin] Other (See Comments)    myalgias    MEDICATIONS:                                                                                                                     No current facility-administered medications on file prior to encounter.    Current Outpatient Prescriptions on File Prior to Encounter  Medication Sig Dispense Refill  . alendronate (FOSAMAX) 70 MG tablet TAKE 1 TABLET  BY MOUTH  EVERY 7 DAYS. TAKE WITH  FULL GLASS OF WATER (Patient taking differently: TAKE 1 TABLET BY MOUTH  EVERY 7 DAYS (FRIDAYS). TAKE WITH  FULL GLASS OF WATER) 12 tablet 3  . Ascorbic Acid 500 MG CAPS Take 500 mg by mouth at bedtime.     . gabapentin (NEURONTIN) 300 MG capsule Take 1 capsule (300 mg total) by mouth 3 (three) times daily. 90 capsule 5  . HYDROmorphone (DILAUDID) 2 MG tablet Take 1 mg by mouth every 4 (four) hours as needed for severe pain.     Marland Kitchen lisinopril (PRINIVIL,ZESTRIL) 40 MG tablet TAKE 1 TABLET BY MOUTH  DAILY 90 tablet 0  . memantine (NAMENDA) 10 MG tablet TAKE 1 TABLET BY MOUTH TWO  TIMES DAILY 180 tablet 0  . metoprolol tartrate (LOPRESSOR) 25 MG tablet Take 25 mg by mouth 2 (two) times daily.    Marland Kitchen senna (SENOKOT) 8.6 MG tablet Take 1 tablet by mouth.       Current Facility-Administered Medications:  .  0.9 %  sodium chloride infusion, 250 mL, Intravenous, Continuous, Kayla J McKenzie, PA-C .  0.9 % NaCl with KCl 20 mEq/ L  infusion, , Intravenous, Continuous, Kayla J McKenzie, PA-C, Last Rate: 10 mL/hr at 10/13/16 2201 .  acetaminophen (TYLENOL) tablet 650 mg, 650 mg, Oral, Q4H PRN **OR** acetaminophen (TYLENOL) suppository 650 mg, 650 mg, Rectal, Q4H PRN, Lennie Muckle McKenzie, PA-C .  alum & mag hydroxide-simeth (MAALOX/MYLANTA) 200-200-20  MG/5ML suspension 30 mL, 30 mL, Oral, Q6H PRN, Kayla J McKenzie, PA-C .  bisacodyl (DULCOLAX) EC tablet 5 mg, 5 mg, Oral, Daily PRN, Lennie Muckle McKenzie, PA-C .  calcium-vitamin D (OSCAL WITH D) 500-200 MG-UNIT per tablet 1 tablet, 1 tablet, Oral, BID, Phylliss Bob, MD, 1 tablet at 10/13/16 2237 .  diazepam (VALIUM) tablet 5 mg, 5 mg, Oral, Q6H PRN, Lennie Muckle McKenzie, PA-C, 5 mg at 10/13/16 2239 .  docusate sodium (COLACE) capsule 100 mg, 100 mg, Oral, BID, Lennie Muckle McKenzie, PA-C, 100 mg at 10/13/16 2237 .  donepezil (ARICEPT) tablet 10 mg, 10 mg, Oral, QHS, Kayla J McKenzie, PA-C, 10 mg at 10/13/16 2239 .  gabapentin (NEURONTIN)  capsule 300 mg, 300 mg, Oral, TID, Lennie Muckle McKenzie, PA-C, 300 mg at 10/13/16 2239 .  hydrALAZINE (APRESOLINE) injection 10 mg, 10 mg, Intravenous, Q4H PRN, Rise Patience, MD .  HYDROmorphone (DILAUDID) injection 0.5-1 mg, 0.5-1 mg, Intravenous, Q2H PRN, Lennie Muckle McKenzie, PA-C .  HYDROmorphone (DILAUDID) tablet 1-2 mg, 1-2 mg, Oral, Q4H PRN, Lennie Muckle McKenzie, PA-C, 2 mg at 10/14/16 0342 .  memantine (NAMENDA) tablet 10 mg, 10 mg, Oral, BID, Lennie Muckle McKenzie, PA-C, 10 mg at 10/13/16 2238 .  menthol-cetylpyridinium (CEPACOL) lozenge 3 mg, 1 lozenge, Oral, PRN **OR** phenol (CHLORASEPTIC) mouth spray 1 spray, 1 spray, Mouth/Throat, PRN, Lennie Muckle McKenzie, PA-C .  multivitamin with minerals tablet 1 tablet, 1 tablet, Oral, Daily, Kayla J McKenzie, PA-C .  ondansetron (ZOFRAN) injection 4 mg, 4 mg, Intravenous, Q4H PRN, Lennie Muckle McKenzie, PA-C .  senna (SENOKOT) tablet 8.6 mg, 1 tablet, Oral, QHS PRN, Lennie Muckle McKenzie, PA-C .  senna-docusate (Senokot-S) tablet 1 tablet, 1 tablet, Oral, QHS PRN, Lennie Muckle McKenzie, PA-C .  sodium chloride 0.9 % bolus 1,000 mL, 1,000 mL, Intravenous, Once, Rise Patience, MD, 1,000 mL at 10/14/16 0640 .  sodium chloride flush (NS) 0.9 % injection 3 mL, 3 mL, Intravenous, Q12H, Kayla J McKenzie, PA-C, 3 mL at 10/13/16 2243 .  sodium chloride flush (NS) 0.9 % injection 3 mL, 3 mL, Intravenous, PRN, Kayla J McKenzie, PA-C .  sodium phosphate (FLEET) 7-19 GM/118ML enema 1 enema, 1 enema, Rectal, Once PRN, Kayla J McKenzie, PA-C .  vitamin C (ASCORBIC ACID) tablet 500 mg, 500 mg, Oral, QHS, Kayla J McKenzie, PA-C, 500 mg at 10/13/16 2237 .  zolpidem (AMBIEN) tablet 5 mg, 5 mg, Oral, QHS PRN, Lennie Muckle McKenzie, PA-C   ROS:                                                                                                                                       History obtained from patient. As per HPI. Denies saddle anesthesia or loss of sensation to thighs, lower legs or  feet.   Blood pressure (!) 146/86, pulse 80, temperature (!) 96.9 F (36.1 C), temperature source Oral, resp. rate  16, height 5\' 6"  (1.676 m), weight 77 kg (169 lb 12.1 oz), SpO2 100 %.  General Examination:                                                                                                      HEENT-  Normocephalic/atraumatic.    Lungs- No gross wheezing. Respirations unlabored.  Extremities- Warm and well perfused.    Neurological Examination Mental Status: Intact to complex questions and commands. Speech fluent without evidence of aphasia.  Able to follow all commands without difficulty. Pleasant and cooperative.  Cranial Nerves: II: Visual fields grossly normal, pupils equal, round, reactive to light  III,IV, VI: ptosis not present, extra-ocular motions intact bilaterally V,VII: smile symmetric, facial temperature sensation normal bilaterally VIII: hearing intact to conversation IX,X: no hypophonia XI: bilateral shoulder shrug symmetric XII: midline tongue extension Motor: Tone and bulk normal x 4.  Bilateral upper extremities 5/5 proximal and distal. RLE 5/5 proximal and distal LLE 4-/5 hip flexion and knee extension (limited by pain), 3/5 knee flexion, 4+/5 ankle dorsi-plantar flexion.  Sensory: Tender to palpation left thigh muscle beds. No loss of temperature or fine touch sensation at multiple locations along left thigh, lower leg and foot. Upper extremities and RLE also with normal sensation.  Deep Tendon Reflexes: 2+ and symmetric throughout with no asymmetry Plantars: Right: downgoing   Left: downgoing Cerebellar: Normal FNF bilaterally Gait: Deferred    Lab Results: Basic Metabolic Panel:  Recent Labs Lab 10/08/16 1519 10/13/16 2049  NA 142 137  K 4.2 4.2  CL 108 102  CO2 27 28  GLUCOSE 93 148*  BUN 21* 14  CREATININE 0.97 0.94  CALCIUM 9.7 9.0    Liver Function Tests:  Recent Labs Lab 10/08/16 1519 10/13/16 2049  AST 26 28  ALT 18 17   ALKPHOS 62 57  BILITOT 0.5 0.8  PROT 6.7 5.7*  ALBUMIN 4.1 3.5   No results for input(s): LIPASE, AMYLASE in the last 168 hours. No results for input(s): AMMONIA in the last 168 hours.  CBC:  Recent Labs Lab 10/08/16 1519 10/13/16 2049  WBC 8.5 11.0*  NEUTROABS 6.0 10.2*  HGB 13.7 11.7*  HCT 40.5 34.4*  MCV 100.7* 100.6*  PLT 139* 107*    Cardiac Enzymes:  Recent Labs Lab 10/13/16 2049  TROPONINI <0.03    Lipid Panel: No results for input(s): CHOL, TRIG, HDL, CHOLHDL, VLDL, LDLCALC in the last 168 hours.  CBG:  Recent Labs Lab 10/13/16 1717 10/13/16 1851 10/13/16 2200  GLUCAP 53 81 132*    Microbiology: Results for orders placed or performed during the hospital encounter of 10/08/16  Surgical pcr screen     Status: None   Collection Time: 10/08/16  3:17 PM  Result Value Ref Range Status   MRSA, PCR NEGATIVE NEGATIVE Final   Staphylococcus aureus NEGATIVE NEGATIVE Final    Comment:        The Xpert SA Assay (FDA approved for NASAL specimens in patients over 20 years of age), is one component of a comprehensive surveillance program.  Test performance  has been validated by [facility] for patients greater than or equal to 26 year old. It is not intended to diagnose infection nor to guide or monitor treatment.     Coagulation Studies:  Recent Labs  10/13/16 0953  LABPROT 15.6*  INR 1.23    Imaging: Dg Lumbar Spine 2-3 Views  Result Date: 10/13/2016 CLINICAL DATA:  L3-4 TLIF EXAM: DG C-ARM 61-120 MIN; LUMBAR SPINE - 2-3 VIEW COMPARISON:  None. FINDINGS: 45 seconds of fluoroscopic time utilized for talar half of the elbow 3 4 interspace. Hardware noted within the pedicles of L3 and L4 without breech of vertebral endplates. Interbody disc noted at L3-4. IMPRESSION: L3-4 transforaminal lumbar interbody fusion. Electronically Signed   By: Ashley Royalty M.D.   On: 10/13/2016 17:58   Ct Head Wo Contrast  Result Date: 10/13/2016 CLINICAL DATA:   Bilateral lower extremity weakness EXAM: CT HEAD WITHOUT CONTRAST TECHNIQUE: Contiguous axial images were obtained from the base of the skull through the vertex without intravenous contrast. COMPARISON:  Head CT 02/21/2014 FINDINGS: Brain: No mass lesion, intraparenchymal hemorrhage or extra-axial collection. No evidence of acute cortical infarct. There is confluent periventricular hypoattenuation compatible with chronic microvascular disease. Vascular: No hyperdense vessel or unexpected calcification. Skull: Normal visualized skull base, calvarium and extracranial soft tissues. Sinuses/Orbits: No sinus fluid levels or advanced mucosal thickening. No mastoid effusion. Normal orbits. IMPRESSION: 1. No acute intracranial abnormality. 2. Advanced chronic microvascular ischemia. Electronically Signed   By: Ulyses Jarred M.D.   On: 10/13/2016 22:02   Dg Lumbar Spine 1 View  Result Date: 10/13/2016 CLINICAL DATA:  L3-4 interbody fusion EXAM: LUMBAR SPINE - 1 VIEW COMPARISON:  Myelogram 09/28/2016 FINDINGS: Initial lateral film shows the lower needle at the spinous process of L3 an the upper needle at the interspinous space of L1-2. IMPRESSION: Spinous process at L3 and interspinous space L1-2 localized. Electronically Signed   By: Nelson Chimes M.D.   On: 10/13/2016 15:10   Dg Chest Port 1 View  Result Date: 10/13/2016 CLINICAL DATA:  Weakness EXAM: PORTABLE CHEST 1 VIEW COMPARISON:  10/08/2016 FINDINGS: Left-sided pacing device, similar compared to prior. No acute infiltrate or effusion. Stable cardiomediastinal silhouette with atherosclerosis. No pneumothorax. IMPRESSION: No radiographic evidence for acute cardiopulmonary abnormality Electronically Signed   By: Donavan Foil M.D.   On: 10/13/2016 20:42   Dg C-arm 1-60 Min  Result Date: 10/13/2016 CLINICAL DATA:  L3-4 TLIF EXAM: DG C-ARM 61-120 MIN; LUMBAR SPINE - 2-3 VIEW COMPARISON:  None. FINDINGS: 45 seconds of fluoroscopic time utilized for talar half of the  elbow 3 4 interspace. Hardware noted within the pedicles of L3 and L4 without breech of vertebral endplates. Interbody disc noted at L3-4. IMPRESSION: L3-4 transforaminal lumbar interbody fusion. Electronically Signed   By: Ashley Royalty M.D.   On: 10/13/2016 17:58    Assessment: 1. DDx for acute lower extremity weakness includes bilateral ACA territory hypoperfusion during syncopal spell, versus transient hypoperfusion of anterior spinal artery territory, which is felt to be more likely.  2. CT head negative for ACA infarctions.  3. Persistent atrial fibrillation.  4. 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  5. History of polymyalgia rheumatica.   Recommendations: 1. Unable to obtain MRI spine due to pacemaker.  2. CTA of thoracic aorta to assess for possible structural abnormality that would predispose to spinal cord ischemia.  3. If CTA thoracic aorta is negative, could consider selective multilevel catheter based angiography of the spinal arteries.  4.  Carotid ultrasound to assess for the following: In rare cases, bilateral ACA territory hypoperfusion can occur if there is sufficient carotid artery bulb stenosis, or severe stenoses more distally.  5. A CTA of the head could also be obtained to determine if he has azygous origin of the ACAs, which would also predispose to bilateral lower extremity weakness in cases of cerebral hypoperfusion or embolic stroke to the azygous ACA origin.  6. Would resume anticoagulation as soon as possible.  7. PT.   Electronically signed: Dr. Kerney Elbe 10/13/2016, 10:56 PM

## 2016-10-13 NOTE — Consult Note (Signed)
Reason for Consult: Syncope. Referring Physician: Dr. Lynann Bologna. Orthopedic surgeon.  Tanner Hensley is an 76 y.o. male.  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 Patient was back to the room and went to the bathroom. After using the commode was standing up when patient felt dizzy and lost consciousness. The loss of consciousness was brief. Following which patient had weakness of both lower extremities. Denies any incontinence of urine or bowel. On my exam patient denies any chest pain or shortness of breath. Has weakness of both lower extremities but has good intact reflexes and sensation.      Past Medical History:  Diagnosis Date  . Arthritis    sees Dr. Charlestine Night  . Atrial fibrillation ([place])    (Dr. Phylliss Blakes at [facility] and Dr. Lovena Le at [facility] polymyalgia rheumatica (Dr. Charlestine Night)  . CHF (congestive heart failure) (HCC)    NOT SURE WHEN  . Diabetes mellitus type II    DIET CONTROLLED AND LOST 40 LBS  . Dysrhythmia    chronic a=fib with cardiac ablations  . GERD (gastroesophageal reflux disease)   . Gout   . High cholesterol   . Hypertension   . Insomnia   . Neuromuscular disorder ([place])    POLY MYALGIA   RHEUMATICA  . Polymyalgia rheumatica ([place])    sees Dr. Charlestine Night   . Presence of permanent cardiac pacemaker    [employer]  08/2011  . Urticaria     Past Surgical History:  Procedure Laterality Date  . cardiac ablation x two at [facility]     2006, 2008, 2013   BY DR. Fitzgerald  . CARDIAC CATHETERIZATION     has had multiple, but last one 2017  . COLONOSCOPY  07/06/10   per Dr. Oretha Caprice, single poly, repeat in 5 yrs.   Marland Kitchen HERNIA REPAIR     right ing 2011  . left knee arthroscopy  02/03/10   per Dr. Lindwood Qua  . right inguinal herniorrhaphy  11/28/09    per Dr. Donnie [place]a     Family History  Problem Relation Age of Onset  . Coronary artery disease Other   . Hypertension Other   . Heart disease Other     Social History:  reports that he has never smoked. He has never used smokeless tobacco. He reports that he does not drink alcohol or use drugs.  Allergies:  Allergies  Allergen Reactions  . Codeine Rash and Other (See Comments)    Dizziness, low blood pressure and heart rate  . Hydrocodone-Acetaminophen Rash and Other (See Comments)    Dizziness, low blood pressure and heart rate  . Tramadol Nausea And Vomiting  . Lipitor [Atorvastatin] Other (See Comments)    Myalgias   . Robaxin [Methocarbamol] Other (See Comments)    Dropped blood pressure too much  . Zocor [Simvastatin] Other (See Comments)    myalgias    Medications: I have reviewed the patient's current medications.  Results for orders placed or performed during the hospital encounter of 10/13/16 (from the past 48 hour(s))  Protime-INR     Status: Abnormal   Collection Time: 10/13/16  9:53 AM  Result Value Ref Range   Prothrombin Time 15.6 (H) 11.4 - 15.2 seconds   INR 1.23   Glucose, capillary     Status: None   Collection Time: 10/13/16  5:17 PM  Result Value Ref Range   Glucose-Capillary 91 65 - 99 mg/dL  Glucose, capillary     Status: None   Collection Time: 10/13/16  6:51 PM  Result Value Ref Range   Glucose-Capillary 81 65 - 99 mg/dL    Dg Lumbar Spine 2-3 Views  Result Date: 10/13/2016 CLINICAL DATA:  L3-4 TLIF EXAM: DG C-ARM 61-120 MIN; LUMBAR SPINE - 2-3 VIEW COMPARISON:  None. FINDINGS: 45 seconds of fluoroscopic time utilized for talar half of the elbow 3 4 interspace. Hardware noted within the pedicles of L3 and L4 without breech of vertebral endplates. Interbody disc noted at L3-4. IMPRESSION: L3-4 transforaminal lumbar interbody fusion. Electronically Signed   By: Ashley Royalty M.D.   On: 10/13/2016 17:58   Dg Lumbar Spine 1 View  Result Date: 10/13/2016 CLINICAL  DATA:  L3-4 interbody fusion EXAM: LUMBAR SPINE - 1 VIEW COMPARISON:  Myelogram 09/28/2016 FINDINGS: Initial lateral film shows the lower needle at the spinous process of L3 an the upper needle at the interspinous space of L1-2. IMPRESSION: Spinous process at L3 and interspinous space L1-2 localized. Electronically Signed   By: Nelson Chimes M.D.   On: 10/13/2016 15:10   Dg C-arm 1-60 Min  Result Date: 10/13/2016 CLINICAL DATA:  L3-4 TLIF EXAM: DG C-ARM 61-120 MIN; LUMBAR SPINE - 2-3 VIEW COMPARISON:  None. FINDINGS: 45 seconds of fluoroscopic time utilized for talar half of the elbow 3 4 interspace. Hardware noted within the pedicles of L3 and L4 without breech of vertebral endplates. Interbody disc noted at L3-4. IMPRESSION: L3-4 transforaminal lumbar interbody fusion. Electronically Signed   By: Ashley Royalty M.D.   On: 10/13/2016 17:58    Review of Systems  HENT: Negative.   Eyes: Negative.   Respiratory: Negative.   Cardiovascular: Negative.   Gastrointestinal: Negative.   Genitourinary: Negative.   Musculoskeletal: Negative.   Skin: Negative.   Neurological: Positive for focal weakness (Bilateral lower extremity weakness.) and weakness.  Psychiatric/Behavioral: Negative.    Blood pressure (!) 146/86, pulse 80, temperature (!) 96.9 F (36.1 C), temperature source Oral, resp. rate 16, height 5\' 6"  (1.676 m), weight 77 kg (169 lb 12.1 oz), SpO2 100 %. Physical Exam  Constitutional: He is oriented to person, place, and time. He appears well-developed and well-nourished. No distress.  HENT:  Head: Normocephalic and atraumatic.  Eyes: Conjunctivae are normal. Pupils are equal, round, and reactive to light. Right eye exhibits no discharge. Left eye exhibits no discharge. No scleral icterus.  Neck: Normal range of motion. Neck supple.  Cardiovascular: Normal rate and normal heart sounds.   Respiratory: Effort normal and breath sounds normal.  GI: Soft. Bowel sounds are normal.   Musculoskeletal: Normal range of motion. He exhibits edema and deformity.  Neurological: He is alert and oriented to person, place, and time.  Patient has bilateral lower extremity weakness. Has intact reflexes and sensation.  Skin: Skin is warm and dry. He is not diaphoretic.  Psychiatric: His behavior is normal.    Assessment/Plan:  #1. Syncope - since patient felt dizzy and has had low normal blood pressure, which could be contributing to patient's syncope, at this time will hold off antihypertensives and hydrate. I have also ordered 1 L normal saline bolus. Check cardiac markers, d-dimer (if elevated will get CT angiogram of the chest), check cortisol levels and lactate levels. Chest x-ray and UA are pending to rule out any infectious cause. Patient has history of polymyalgia rheumatica per the chart. Patient has not had  any steroid for last 1 year. Follow cortisol levels. #2. Bilateral lower extremity weakness - as per the patient this is new. I will discuss with Dr. Lynann Bologna, orthopedic surgeon who feels this change could not be related to the surgery. I have consulted neurologist Dr. Cheral Marker who be seeing patient in consult. CT head is pending. #3. Chronic atrial fibrillation and history of high degree AV block status post pacemaker placement - Coumadin on hold secondary to recent surgery. Patient metoprolol is on hold secondary to low normal blood pressure. Closely monitor in telemetry. Restart metoprolol as soon as possible once blood pressure stabilizes. #4. History of CAD status post stenting - cardiologist notes reviewed. Patient has had normal cardiac cath in 2016. We'll cycle cardiac markers. #5. Hypertension - hold antihypertensives due to low normal blood pressure.  All labs and chest x-ray and CT head pending.  Thanks for involving [place] in patient's care and we will follow along with you.  Rise Patience 10/13/2016, 8:32 PM

## 2016-10-13 NOTE — Significant Event (Signed)
Rapid Response Event Note  Overview: Called to see patient with syncopal episode  Event Type: Neurologic  Initial Focused Assessment:  On arrival patient supine in bed - pale warm and dry - having some rigors - alert - speech clear - moves upper extremities well - lower extremities equally weak - resists when examiner tries to lift legs - denies pain - confused to month and age - wife states not unusual post anethesia - mild dementia.  Bil BS = clear - denies CP or SOB.  Hx afib - HR 80 - wife states he has a pacer - BP 150/83 RR 16 O2 sats 100% on RA.     Interventions:  CBG 81.  Blankets applied for the rigors.  Dressing DI to back.  Beginning to move legs now - bends knees to command can lift legs a little off bed - again legs equally weak.  Dr. Lynann Bologna notified by Casey Burkitt.  To call medicine consult - and move to telemetry.  Will get 12 lead EKG - history of afib - off Coumadin x 5 days for surgery. Placed on O2 sat monitor.   Handoff to [employer].    Plan of Care (if not transferred):  Event Summary: Name of Physician Notified: Dr. Verneda Skill at [phone]  Name of Consulting Physician Notified: [facility] at  (by Ortho team)  Outcome: Transferred (Comment) [phone])  Event End Time: 1915  Quin Hoop

## 2016-10-13 NOTE — Progress Notes (Signed)
Orthopedic Tech Progress Note Patient Details:  Tanner Hensley 1941-05-28 [id number]  Ortho Devices Type of Ortho Device: CAM walker Ortho Device/Splint Interventions: Ordered, Application   Braulio Bosch 10/13/2016, 4:44 PM

## 2016-10-13 NOTE — Anesthesia Procedure Notes (Signed)
Procedure Name: Intubation Date/Time: 10/13/2016 12:35 PM Performed by: Tressia Miners LEFFEW Pre-anesthesia Checklist: Patient identified, Patient being monitored, Timeout performed, Emergency Drugs available and Suction available Patient Re-evaluated:Patient Re-evaluated prior to inductionOxygen Delivery Method: Circle System Utilized Preoxygenation: Pre-oxygenation with 100% oxygen Intubation Type: IV induction Ventilation: Mask ventilation without difficulty Laryngoscope Size: Mac and 3 Grade View: Grade I Tube type: Oral Tube size: 7.5 mm Number of attempts: 1 Airway Equipment and Method: Stylet Placement Confirmation: ETT inserted through vocal cords under direct vision,  positive ETCO2 and breath sounds checked- equal and bilateral Secured at: 21 cm Tube secured with: Tape Dental Injury: Teeth and Oropharynx as per pre-operative assessment

## 2016-10-14 ENCOUNTER — Inpatient Hospital Stay (HOSPITAL_COMMUNITY): Payer: Medicare Other

## 2016-10-14 ENCOUNTER — Encounter (HOSPITAL_COMMUNITY): Payer: Self-pay | Admitting: General Practice

## 2016-10-14 DIAGNOSIS — R29898 Other symptoms and signs involving the musculoskeletal system: Secondary | ICD-10-CM

## 2016-10-14 DIAGNOSIS — I481 Persistent atrial fibrillation: Secondary | ICD-10-CM

## 2016-10-14 HISTORY — PX: POSTERIOR LUMBAR FUSION: SHX6036

## 2016-10-14 LAB — GLUCOSE, CAPILLARY
GLUCOSE-CAPILLARY: 199 mg/dL — AB (ref 65–99)
Glucose-Capillary: 173 mg/dL — ABNORMAL HIGH (ref 65–99)
Glucose-Capillary: 157 mg/dL — ABNORMAL HIGH (ref 65–99)
Glucose-Capillary: 122 mg/dL — ABNORMAL HIGH (ref 65–99)

## 2016-10-14 LAB — TROPONIN I

## 2016-10-14 MED ORDER — IOPAMIDOL (ISOVUE-370) INJECTION 76%
INTRAVENOUS | Status: AC
  Administered 2016-10-14: 80 mL
  Filled 2016-10-14: qty 50

## 2016-10-14 MED ORDER — HYDRALAZINE HCL 20 MG/ML IJ SOLN: 10.0000 mg | INTRAMUSCULAR | Status: DC | PRN

## 2016-10-14 MED ORDER — SODIUM CHLORIDE 0.9 % IV BOLUS (SEPSIS)
500.0000 mL | Freq: Once | INTRAVENOUS | Status: AC
  Administered 2016-10-14: 500 mL via INTRAVENOUS

## 2016-10-14 MED ORDER — IOPAMIDOL (ISOVUE-370) INJECTION 76%
INTRAVENOUS | Status: AC
  Administered 2016-10-14: 50 mL
  Filled 2016-10-14: qty 100

## 2016-10-14 MED ORDER — SODIUM CHLORIDE 0.9 % IV BOLUS (SEPSIS)
1000.0000 mL | Freq: Once | INTRAVENOUS | Status: AC
  Administered 2016-10-14: 1000 mL via INTRAVENOUS

## 2016-10-14 MED FILL — Thrombin For Soln 20000 Unit: CUTANEOUS | Qty: 1 | Status: AC

## 2016-10-14 MED FILL — Heparin Sodium (Porcine) Inj 1000 Unit/ML: INTRAMUSCULAR | Qty: 30 | Status: AC

## 2016-10-14 MED FILL — Sodium Chloride IV Soln 0.9%: INTRAVENOUS | Qty: 1000 | Status: AC

## 2016-10-14 NOTE — Consult Note (Signed)
            Tanner Hensley Tanner Hensley Primary Care Navigator  10/14/2016  Tanner Hensley February 06, 1941 [id number]  Able to meet with patient and son Tanner Cola) at the bedside to identify possible discharge needs. Patient reports having "continued extreme pain to back and left knee" which failed non-surgical interventions that had led to this admission/surgery.  Patient endorses Dr. Alysia Penna with [facility] as his primary care provider.   Patient states using [facility] service and [facility] in [place] to obtain medications without any problem.   Patient's wife Tanner Lynch) manages his medications at home as reported.   Wife will be providing transportation to his doctors' appointments after discharge.  Patient's wife will be the primary caregiver at home as stated.   Anticipated discharge plan is home with home health services per son.  Patient and son expressed understanding to call primary care provider's office when he returns home, for a post discharge follow-up appointment within a week or sooner if needs arise. Patient letter (with PCP contact number) was provided as their reminder. Patient reports that he will need to see primary care provider after discharge since he had been due for his annual physical check-up last month..  Patient had communicated no other care management needs or concerns at this time. Patient and son reports that DM had been well managed and has not been currently on any DM medications with A1c at 5.9.  For additional questions please contact:  Edwena Felty A. Juniper Snyders, BSN, RN-BC [facility] PRIMARY CARE Navigator Cell: [phone]

## 2016-10-14 NOTE — Progress Notes (Signed)
    As previously documented, patient had a syncopal episode last night. Medicine was consulted and I spoke with hospitalist, Dr. Hal Hope last night. He is currently being worked up. Thus far, cardiac enzymes negative. Hg 11.7. Patient did also report LE weakness. Neurology consult was reportedly placed last night. Patient denies leg pain. He does feel that his strength has improved substantially since last night.   Physical Exam: Vitals:   10/14/16 0306 10/14/16 0620  BP: 97/62 (!) 96/48  Pulse:  79  Resp:  18  Temp:  99.6 F (37.6 C)    Dressing in place Patient does have full 5/5 strength to DF, PR, KE bilaterally. No focal strength deficits noted.   POD #1 s/p L4/5 decompression and fusion, currently being worked up by medicine and currently pending a neurology consult per medicine.  - Patient's BP continues to be low. Management of this per medicine. Hold Valium.  - Unless medically contraindicated, I would like patient to be at least OOB TID today - CES exceedingly unlikely, given that his reported weakness occurred immediately following a syncopal episode, and since he has no leg pain, and no focal strength deficits. Also, the patient did void normally last night prior to his syncopal episode and has voided about 5 times since then without difficulty.  - I will continue to follow and I do appreciate the assistance of Dr. Hal Hope and neurology

## 2016-10-14 NOTE — Anesthesia Postprocedure Evaluation (Addendum)
Anesthesia Post Note  Patient: Tanner Hensley  Procedure(s) Performed: Procedure(s) (LRB): LEFT SIDED LUMBAR 3-4 TRANSFORAMINAL LUMBAR INTEBODY FUSION WITH INSTRUMENTATION AND ALLOGRAFT (Left)  Patient location during evaluation: PACU Anesthesia Type: General Level of consciousness: awake and alert Pain management: pain level controlled Vital Signs Assessment: post-procedure vital signs reviewed and stable Respiratory status: spontaneous breathing, nonlabored ventilation, respiratory function stable and patient connected to nasal cannula oxygen Cardiovascular status: blood pressure returned to baseline and stable Postop Assessment: no signs of nausea or vomiting Anesthetic complications: no       Last Vitals:  Vitals:   10/14/16 0306 10/14/16 0620  BP: 97/62 (!) 96/48  Pulse:  79  Resp:  18  Temp:  37.6 C    Last Pain:  Vitals:   10/14/16 0620  TempSrc: Oral  PainSc:                  Davielle Lingelbach

## 2016-10-14 NOTE — Care Management Note (Signed)
Case Management Note  Patient Details  Name: DELL SANTELLAN MRN: [id number] Date of Birth: July 23, 1941  Subjective/Objective:                  Patient was admitted for a TLIF. Lives at home with spouse. CM will follow for discharge needs pending PT/OT evals and physician orders.   Action/Plan:   Expected Discharge Date:                  Expected Discharge Plan:     In-House Referral:     Discharge planning Services     Post Acute Care Choice:    Choice offered to:     DME Arranged:    DME Agency:     HH Arranged:    HH Agency:     Status of Service:     If discussed at [employer] of Stay Meetings, dates discussed:    Additional Comments:  Rolm Baptise, RN 10/14/2016, 9:43 AM

## 2016-10-14 NOTE — Op Note (Addendum)
NAME:  Tanner, Hensley NO.:  [id number]  MEDICAL RECORD NO.:  [id number]  LOCATION:                                 FACILITY:  PHYSICIAN:  Phylliss Bob, MD      DATE OF BIRTH:  [birth year]  DATE OF PROCEDURE:  10/13/2016                              OPERATIVE REPORT   PREOPERATIVE DIAGNOSES: 1. Severe left-sided lumbar radiculopathy. 2. 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 3. 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  POSTOPERATIVE DIAGNOSES: 1. Severe left-sided lumbar radiculopathy. 2. 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 3. 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  PROCEDURES: 1. Left-sided L3-4 transforaminal lumbar interbody fusion. 2. Right-sided L3-4 posterolateral fusion. 3. Insertion of interbody device x1 (11 mm x 27 mm, lordotic, [place]     intervertebral spacer). 4. Placement of posterior instrumentation, L3, L4. 5. Use of local autograft. 6. Use of morselized allograft. 7. L3-4 decompression. 8. Intraoperative use of fluoroscopy.  SURGEON:  Phylliss Bob, M.D.  ASSISTANTLonn Georgia McKenzie PA-C.  ANESTHESIA:  General endotracheal anesthesia.  COMPLICATIONS:  None.  DISPOSITION:  Stable.  ESTIMATED BLOOD LOSS:  100 mL.  INDICATIONS FOR SURGERY:  Briefly, Tanner Hensley is a very pleasant 76 year old male who did present to me with rather severe and debilitating pain in the left leg.  The patient's CT myelogram did reveal the findings outlined above.  The patient's pain has been severe and significant and given his ongoing pain and lack of improvement with appropriate nonoperative measures, we did discuss proceeding with the procedure reflected above.  Of note, the patient did also report substantial weakness in  his left leg, mainly with hip flexion and knee extension.  DESCRIPTION OF PROCEDURE:  On October 13, 2016, the patient was brought to surgery and general endotracheal anesthesia was administered.  The patient was placed prone on a well-padded flat Jackson bed with a spinal frame.  Antibiotics were given and a time-out procedure was performed. The back was prepped and draped and a midline incision was made overlying the L3-4 intervertebral space.  The lamina of L3 and L4 was identified and exposed.  The bilateral facet joints were identified and decorticated.  Using anatomic landmarks in addition to AP and lateral fluoroscopy, I did cannulate the L3 and L4 pedicles using a cortical, medial, and lateral trajectory technique.  On the right side, I did place 6 x 35 mm screws and a 45 mm rod was placed and distraction was applied across the rod.  On the left side, bone wax was placed over the cannulated pedicles.  I then performed a partial facetectomy on the left at the L3-4 region.  The traversing left L4 nerve was identified as was the exiting left L3 nerve.  Of note, the exiting L3 nerve was readily noted to be under substantial tension and was noted to be swollen and erythematous.  Immediately, ventral to the nerve was readily identified to be  a very large extruded L3-4 disk herniation.  The herniation was clearly migrated superiorly behind the L3 vertebral body and was located in the foraminal and extraforaminal region.  I did meticulously decompress the L3 nerve by removing multiple disk fragments, which did extend all the way up to the L3 pedicle, and did extend in the extraforaminal region, well lateral to the L3 and L4 pedicles.  I was extremely pleased with the decompression of the nerve that I was able to accomplish.  At this point, with an assistant holding medial retraction of the traversing left L4 nerve, I did use a 15-blade knife to perform an annulotomy.  I then used a series  of curettes and pituitary rongeurs to perform a thorough and complete intervertebral diskectomy.  I was pleased with the diskectomy that I was able to accomplish.  I then placed a series of trials and I did ultimately elect to fill an 11 mm lordotic intervertebral spacer with allograft in the form of ViviGen as well as DBS mix.  The intervertebral spacer was then tamped into position in the usual fashion.  I did liberally packed the intervertebral space with allograft and autograft prior to placing the implant.  I then placed 6 x 35 mm screws on the left at L3 and at L4.  A 40 mm rod was placed and caps were placed and provisionally tightened. Of note, distraction was released on the contralateral right side, prior to placing the rod and caps on the left.  I then placed the remainder of the allograft and autograft into the right posterolateral gutter and along the right posterior elements in order to aid in the success of the fusion.  I was very pleased with the final AP and lateral fluoroscopic images.  The wound was then copiously irrigated.  Of note, the wound was irrigated thoroughly throughout the procedure from start to finish. There was no abnormal EMG activity noted throughout the entire surgery. The wound was then closed in layers using #1 Vicryl followed by 2-0 Vicryl followed by 4-0 Monocryl.  Benzoin and Steri-Strips were applied followed by sterile dressing.  All instrument counts were correct at the termination of the procedure.  Of note, Pricilla Holm was my assistant throughout surgery, and did aid in retraction, suctioning, and closure.     Phylliss Bob, MD   ______________________________ Phylliss Bob, MD    MD/MEDQ  D:  10/13/2016  T:  10/13/2016  Job:  [id number]

## 2016-10-14 NOTE — Evaluation (Signed)
Physical Therapy Evaluation Patient Details Name: SAVIR ERDMANN MRN: [id number] DOB: 1940-12-26 Today's Date: 10/14/2016   History of Present Illness  JACORIEN SIPLE is a 76 y.o. male who presents with ongoing pain in the left leg, severe. Left-sided L3-4 transforaminal lumbar interbody fusion. Right-sided L3-4 posterolateral fusion.  Clinical Impression  Patient required +2 assistance for safety due to additional hospital problems. He required continual cueing for breathing while ambulating. Continued therapy to include HHPT after d/c to help address residual strength, balance, and activity limitations.       Follow Up Recommendations Home health PT;Supervision for mobility/OOB    Equipment Recommendations  None recommended by PT    Recommendations for Other Services       Precautions / Restrictions Precautions Precautions: Back Precaution Booklet Issued: Yes (comment) Required Braces or Orthoses: Spinal Brace Spinal Brace: Thoracolumbosacral orthotic;Applied in sitting position Restrictions Weight Bearing Restrictions: No      Mobility  Bed Mobility Overal bed mobility: Needs Assistance Bed Mobility: Sidelying to Sit;Rolling;Sit to Sidelying Rolling: Min assist Sidelying to sit: Min assist     Sit to sidelying: Min assist General bed mobility comments: Verbal and tactile cues on proper hand and foot placement and back precautions   Transfers Overall transfer level: Needs assistance Equipment used: Rolling walker (2 wheeled) Transfers: Sit to/from Stand Sit to Stand: Min guard         General transfer comment: verbal cues for proper hand placement on bed and walker during sit to stand   Ambulation/Gait Ambulation/Gait assistance: Min assist Ambulation Distance (Feet): 15 Feet Assistive device: Standard walker Gait Pattern/deviations: Step-to pattern;Decreased step length - right;Decreased step length - left;Decreased stride length Gait velocity: decreased  gait velocity    General Gait Details: pt needed cueing for breating during ambulation and to take large steps. Decreased weight bearing on LLE.   Stairs            Wheelchair Mobility    Modified Rankin (Stroke Patients Only)       Balance Overall balance assessment: Needs assistance Sitting-balance support: Feet supported;No upper extremity supported Sitting balance-Leahy Scale: Good     Standing balance support: Bilateral upper extremity supported Standing balance-Leahy Scale: Fair Standing balance comment: Pt able to maintain static balance without UE support                              Pertinent Vitals/Pain Pain Assessment: 0-10 Pain Score: 6  Pain Location: across low back  Pain Descriptors / Indicators: Grimacing Pain Intervention(s): Limited activity within patient's tolerance;Monitored during session    Home Living Family/patient expects to be discharged to:: Private residence Living Arrangements: Spouse/significant other Available Help at Discharge: Family Type of Home: House Home Access: Stairs to enter Entrance Stairs-Rails: None (pt stated that he uses waker to get into and out of home) Technical brewer of Steps: 2 Home Layout: One level Home Equipment: Walker - 2 wheels;Cane - single point;Bedside commode;Shower seat      Prior Function Level of Independence: Independent with assistive device(s)         Comments: pt used walker to get into and out of the house.      Hand Dominance        Extremity/Trunk Assessment   Upper Extremity Assessment Upper Extremity Assessment: Defer to OT evaluation    Lower Extremity Assessment Lower Extremity Assessment: Generalized weakness;LLE deficits/detail LLE Deficits / Details: Hip Flexion 3+/5, Knee Extension (  4-/5), Dorsiflexion (5/5), Plantarflexion (5/5), Knee Flexion (5/5), Great toe extension (5/5)  LLE: Unable to fully assess due to immobilization       Communication    Communication: No difficulties  Cognition Arousal/Alertness: Awake/alert                          General Comments      Exercises     Assessment/Plan    PT Assessment Patient needs continued PT services  PT Problem List Decreased strength;Decreased mobility;Pain;Decreased balance;Decreased activity tolerance;Decreased knowledge of precautions;Decreased knowledge of use of DME          PT Treatment Interventions DME instruction;Gait training;Stair training;Functional mobility training;Therapeutic activities;Balance training;Patient/family education    PT Goals (Current goals can be found in the Care Plan section)  Acute Rehab PT Goals Patient Stated Goal: To get back to independence and working in the yard.  PT Goal Formulation: With patient/family Time For Goal Achievement: 10/21/16 Potential to Achieve Goals: Good    Frequency Min 5X/week   Barriers to discharge        Co-evaluation               End of Session Equipment Utilized During Treatment: Gait belt;Back brace Activity Tolerance: Patient limited by pain Patient left: in bed;with call bell/phone within reach;with bed alarm set;with family/visitor present           Time: [id number] PT Time Calculation (min) (ACUTE ONLY): 29 min   Charges:   PT Evaluation $PT Eval Moderate Complexity: 1 Procedure PT Treatments $Therapeutic Activity: 8-22 mins   PT G CodesGlee Arvin, SPT 10-29-2016

## 2016-10-14 NOTE — Evaluation (Signed)
Occupational Therapy Evaluation Patient Details Name: Tanner Hensley MRN: [id number] DOB: [birth year] Today's Date: 10/14/2016    History of Present Illness Tanner Hensley is a 76 y.o. male who presents with ongoing pain in the left leg, severe. Left-sided L3-4 transforaminal lumbar interbody fusion. Right-sided L3-4 posterolateral fusion.   Clinical Impression   Pt admitted with the above diagnoses and presents with below problem list. Pt will benefit from continued acute OT to address the below listed deficits and maximize independence with basic ADLs prior to d/c home with family assisting as needed. PTA pt was mod I with ADLs, spouse donned socks. Pt is currently min to mod A with LB ADLs, min guard to light min A for functional transfers and while taking pivotal steps in room. Would recommend +2 for safety with functional mobility as pt still reporting lightheadedness standing. BP assessed at end of session: 110/46. Session limited by reported lightheadedness/dizziness once in standing position.      Follow Up Recommendations  Home health OT;Supervision/Assistance - 24 hour    Equipment Recommendations  None recommended by OT    Recommendations for Other Services       Precautions / Restrictions Precautions Precautions: Back Precaution Booklet Issued: Yes (comment) Required Braces or Orthoses: Spinal Brace Spinal Brace: Thoracolumbosacral orthotic;Applied in sitting position Restrictions Weight Bearing Restrictions: No      Mobility Bed Mobility Overal bed mobility: Needs Assistance Bed Mobility: Sit to Sidelying Rolling: Min assist Sidelying to sit: Min assist     Sit to sidelying: Min assist General bed mobility comments: min A to advance BLE up onto bed to maintain precautions.   Transfers Overall transfer level: Needs assistance Equipment used: Rolling walker (2 wheeled) Transfers: Sit to/from Stand Sit to Stand: Min guard;Min assist         General  transfer comment: light min A from lower seat surfaces. Slow and guarded with movements. Cues for technique with rw. "How do I do this (stand up)?"    Balance Overall balance assessment: Needs assistance Sitting-balance support: Feet supported;No upper extremity supported Sitting balance-Leahy Scale: Good     Standing balance support: Bilateral upper extremity supported Standing balance-Leahy Scale: Fair Standing balance comment: Pt able to maintain static balance without UE support                             ADL Overall ADL's : Needs assistance/impaired Eating/Feeding: Set up;Sitting   Grooming: Set up;Sitting   Upper Body Bathing: Set up;Sitting   Lower Body Bathing: Minimal assistance;Sit to/from stand;Moderate assistance   Upper Body Dressing : Set up;Sitting   Lower Body Dressing: Minimal assistance;Sit to/from stand;Moderate assistance   Toilet Transfer: Designer, television/film set Details (indicate cue type and reason): pivotal steps this session due to onset of lightheadedness.  Toileting- Clothing Manipulation and Hygiene: Moderate assistance;Sit to/from stand   Tub/ Shower Transfer: Min guard;Minimal assistance;Ambulation;Shower seat;Rolling walker   Functional mobility during ADLs: Min guard;Rolling walker General ADL Comments: Pt completed pivotal steps from recliner to bed. Session limited by reported lightheadedness/mild dizziness in standing. Sat EOB about 3-4 minutes before standing and sidestepping to left. Completed bed mobility as detailed above. Family present during session. ADL education provided including strategies and AE/DME.     Vision     Perception     Praxis      Pertinent Vitals/Pain Pain Assessment: 0-10 Pain Score: 4  Pain Location: across low back  Pain  Descriptors / Indicators: Grimacing Pain Intervention(s): Limited activity within patient's tolerance;Monitored during session;Repositioned     Hand Dominance      Extremity/Trunk Assessment Upper Extremity Assessment Upper Extremity Assessment: Generalized weakness   Lower Extremity Assessment Lower Extremity Assessment: Defer to PT evaluation LLE Deficits / Details: Hip Flexion 3+/5, Knee Extension (4-/5), Dorsiflexion (5/5), Plantarflexion (5/5), Knee Flexion (5/5), Great toe extension (5/5)  LLE: Unable to fully assess due to immobilization       Communication Communication Communication: No difficulties   Cognition Arousal/Alertness: Awake/alert Behavior During Therapy: WFL for tasks assessed/performed Overall Cognitive Status: Within Functional Limits for tasks assessed                 General Comments: possible STM deficits at baseline?    General Comments       Exercises       Shoulder Instructions      Home Living Family/patient expects to be discharged to:: Private residence Living Arrangements: Spouse/significant other Available Help at Discharge: Family Type of Home: House Home Access: Stairs to enter [employer] of Steps: 2 Entrance Stairs-Rails: None Home Layout: One level               Home Equipment: Environmental consultant - 2 wheels;Cane - single point;Bedside commode;Shower seat;Adaptive equipment Adaptive Equipment: Reacher Additional Comments: Pt reports he was not very active PTA due to back pain.      Prior Functioning/Environment Level of Independence: Independent with assistive device(s)        Comments: pt used walker to get into and out of the house. Spouse dons/doffs socks.        OT Problem List: Impaired balance (sitting and/or standing);Decreased knowledge of use of DME or AE;Decreased knowledge of precautions;Pain;Decreased activity tolerance   OT Treatment/Interventions: Self-care/ADL training;Energy conservation;DME and/or AE instruction;Therapeutic activities;Patient/family education;Balance training    OT Goals(Current goals can be found in the care plan section) Acute Rehab OT  Goals Patient Stated Goal: To get back to independence and working in the yard.  OT Goal Formulation: With patient/family Time For Goal Achievement: 10/21/16 Potential to Achieve Goals: Good ADL Goals Pt Will Perform Grooming: with modified independence;sitting;standing Pt Will Perform Lower Body Bathing: with modified independence;with adaptive equipment;sit to/from stand Pt Will Perform Lower Body Dressing: with modified independence;with adaptive equipment;sit to/from stand Pt Will Transfer to Toilet: with modified independence;ambulating Pt Will Perform Toileting - Clothing Manipulation and hygiene: with modified independence;sitting/lateral leans;sit to/from stand Pt Will Perform Tub/Shower Transfer: with supervision;ambulating;shower seat;rolling walker Additional ADL Goal #1: Pt will complete bed mobility at mod I level to prepare for OOB ADLs.   OT Frequency: Min 2X/week   Barriers to D/C:            Co-evaluation              End of Session Equipment Utilized During Treatment: Gait belt;Rolling walker;Back brace Nurse Communication: Other (comment) (lightheaded/dizzy in standing)  Activity Tolerance: Other (comment) (lightheaded once in standing, mildly dizzy) Patient left: in bed;with call bell/phone within reach;with family/visitor present   Time: [id number] OT Time Calculation (min): 20 min Charges:  OT General Charges $OT Visit: 1 Procedure OT Evaluation $OT Eval Low Complexity: 1 Procedure G-Codes:    Hortencia Pilar 11-01-16, 2:10 PM

## 2016-10-14 NOTE — Progress Notes (Signed)
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 Patient was back to the room and went to the bathroom. After using the commode was standing up when patient felt dizzy and lost consciousness. The loss of consciousness was brief. Following which patient had weakness of both lower extremities. Denies any incontinence of urine or bowel  Assessment and plan #1. Syncope -suspect orthostatic syncope yesterday when the blood pressure was low, patient is status post fluid resuscitation and therefore orthostatics are negative this morning. Patient felt dizzy and has had low normal blood pressure yesterday, which could be contributing to patient's syncope, at this time will hold off antihypertensives and hydrate.Minimise narcotics and BNZ ,will give another L of  normal saline bolus. Negative cardiac markers, d-dimer  elevated ,  CT angiogram of the chest negative for PE ,   cortisol  6.8  and lactate levels 1.6 . Chest x-ray negative and UA  . Patient has history of polymyalgia rheumatica per the chart. Patient has not had any steroid for last 1 year. Follow cortisol levels. CT angiogram of the neck shows Severe proximal right PCA stenosis, which could have contributing to the symptoms in the setting of low blood pressure  #2. Bilateral lower extremity weakness - as per the patient this is new. I will discuss with Dr. Lynann Bologna, orthopedic surgeon who feels this change could not be related to the surgery.   consulted neurologist Dr. Cheral Marker  for further recommendations. #3.  Chronic atrial fibrillation and history of high degree AV block status post pacemaker placement - Coumadin on hold secondary to recent surgery. Patient metoprolol is on hold secondary to low normal blood pressure. Closely monitor in telemetry. Restart metoprolol as soon as possible once blood pressure stabilizes. Resume anticoagulation when able #4. History of CAD status post stenting - cardiologist notes reviewed. Patient has had normal cardiac cath in 2016. Cardiac markers negative. #5. Hypertension - hold antihypertensives due to low normal blood pressure. #6 Dilated ascending aorta. Recommend annual imaging followup by CTA,or MRA    DVT prophylaxsis PER  neurosurgery  Code Status:  Full code    Family Communication: Discussed in detail with the patient, all imaging results, lab results explained to the patient   Disposition Plan:  1-2 days      Consultants:  Neurology  Procedures:  None  Antibiotics: Anti-infectives    Start     Dose/Rate Route Frequency Ordered Stop   10/13/16 1830  ceFAZolin (ANCEF) IVPB 2g/100 mL premix     2 g 200 mL/hr over 30 Minutes Intravenous Every 8 hours 10/13/16 1825 10/14/16 0404   10/13/16 0914  ceFAZolin (ANCEF) IVPB 2g/100 mL premix     2 g 200 mL/hr over 30 Minutes Intravenous On call to O.R. 10/13/16 0914 10/13/16 1237         HPI/Subjective: Patient's blood pressure has improved, he feels well after volume resuscitation yesterday denies any ongoing chest pain shortness of breath or palpitations  Objective: Vitals:   10/14/16 0306 10/14/16 0620 10/14/16 0621 10/14/16 0842  BP: 97/62 (!) [id number]  Marland Kitchen)  97/52  Pulse:  79  83  Resp:  18  20  Temp:  99.6 F (37.6 C)  100.2 F (37.9 C)  TempSrc:  Oral  Oral  SpO2:   97% 97%  Weight:      Height:        Intake/Output Summary (Last 24 hours) at 10/14/16 0957 Last data filed at 10/14/16 [id number]  Gross per 24 hour  Intake             1871 ml  Output              950 ml  Net               921 ml    Exam:  Examination:  General exam: Appears calm and comfortable  Respiratory system: Clear to auscultation. Respiratory effort normal. Cardiovascular system: S1 & S2 heard, RRR. No JVD, murmurs, rubs, gallops or clicks. No pedal edema. Gastrointestinal system: Abdomen is nondistended, soft and nontender. No organomegaly or masses felt. Normal bowel sounds heard. Central nervous system: Alert and oriented. No focal neurological deficits. Extremities: Symmetric 5 x 5 power. Skin: No rashes, lesions or ulcers Psychiatry: Judgement and insight appear normal. Mood & affect appropriate.     Data Reviewed: I have personally reviewed following labs and imaging studies  Micro Results Recent Results (from the past 240 hour(s))  Surgical pcr screen     Status: None   Collection Time: 10/08/16  3:17 PM  Result Value Ref Range Status   MRSA, PCR NEGATIVE NEGATIVE Final   Staphylococcus aureus NEGATIVE NEGATIVE Final    Comment:        The Xpert SA Assay (FDA approved for NASAL specimens in patients over 87 years of age), is one component of a comprehensive surveillance program.  Test performance has been validated by [facility] for patients greater than or equal to 35 year old. It is not intended to diagnose infection nor to guide or monitor treatment.     Radiology Reports Dg Chest 2 View  Result Date: 10/08/2016 CLINICAL DATA:  Lumbar fusion . EXAM: CHEST  2 VIEW COMPARISON:  12/30/2015. FINDINGS: Cardiac pacer with lead tips in right atrium right ventricle. Stable cardiomegaly. No focal infiltrate. Biapical pleural-parenchymal thickening noted consistent with scarring. Chest is stable from prior exam . IMPRESSION: No acute cardiopulmonary disease. Cardiac pacer stable position. Stable cardiomegaly. Electronically Signed   By: Marcello Moores  Register   On: 10/08/2016 17:03   Dg Lumbar Spine 2-3 Views  Result Date: 10/13/2016 CLINICAL DATA:  L3-4 TLIF EXAM: DG C-ARM  61-120 MIN; LUMBAR SPINE - 2-3 VIEW COMPARISON:  None. FINDINGS: 45 seconds of fluoroscopic time utilized for talar half of the elbow 3 4 interspace. Hardware noted within the pedicles of L3 and L4 without breech of vertebral endplates. Interbody disc noted at L3-4. IMPRESSION: L3-4 transforaminal lumbar interbody fusion. Electronically Signed   By: Ashley Royalty M.D.   On: 10/13/2016 17:58   Ct Head Wo Contrast  Result Date: 10/13/2016 CLINICAL DATA:  Bilateral lower extremity weakness EXAM: CT HEAD WITHOUT CONTRAST TECHNIQUE: Contiguous axial images were obtained from the base of the skull through the vertex without intravenous contrast. COMPARISON:  Head CT 02/21/2014 FINDINGS: Brain: No mass lesion, intraparenchymal hemorrhage or extra-axial collection. No evidence of acute cortical infarct. There is confluent periventricular hypoattenuation compatible with chronic microvascular disease. Vascular: No hyperdense vessel or unexpected calcification. Skull: Normal visualized skull base, calvarium and extracranial soft tissues. Sinuses/Orbits: No sinus fluid levels  or advanced mucosal thickening. No mastoid effusion. Normal orbits. IMPRESSION: 1. No acute intracranial abnormality. 2. Advanced chronic microvascular ischemia. Electronically Signed   By: Ulyses Jarred M.D.   On: 10/13/2016 22:02   Ct Lumbar Spine W Contrast  Result Date: 09/28/2016 CLINICAL DATA:  Lumbar radiculopathy. Low back and left lower extremity pain. EXAM: LUMBAR MYELOGRAM FLUOROSCOPY TIME:  Radiation Exposure Index (as provided by the fluoroscopic device): 267.44 microGray*m^2 Fluoroscopy Time (in minutes and seconds):  27 seconds PROCEDURE: After thorough discussion of risks and benefits of the procedure including bleeding, infection, injury to nerves, blood vessels, adjacent structures as well as headache and CSF leak, written and oral informed consent was obtained. Consent was obtained by Dr. Logan Bores. Time out form was completed.  Patient was positioned prone on the fluoroscopy table. Local anesthesia was provided with 1% lidocaine without epinephrine after prepped and draped in the usual sterile fashion. Puncture was performed at L4-5 using a 3 1/2 inch 22-gauge spinal needle via a right interlaminar approach. Using a single pass through the dura, the needle was placed within the thecal sac, with return of clear CSF. 15 mL of Isovue M 200 was injected into the thecal sac, with normal opacification of the nerve roots and cauda equina consistent with free flow within the subarachnoid space. I personally performed the lumbar puncture and administered the intrathecal contrast. I also personally supervised acquisition of the myelogram images. TECHNIQUE: Contiguous axial images were obtained through the Lumbar spine after the intrathecal infusion of infusion. Coronal and sagittal reconstructions were obtained of the axial image sets. COMPARISON:  Lumbar spine radiographs 08/27/2016 FINDINGS: LUMBAR MYELOGRAM FINDINGS: Lumbar segmentation is normal. There is very mild thoracolumbar dextroscoliosis without listhesis or evidence of dynamic instability on upright flexion and extension images. Disc space narrowing and degenerative endplate sclerosis and osteophytosis are present at L3-4. There is mild-to-moderate waist like narrowing of the thecal sac at this level with asymmetric left lateral recess stenosis and left L4 nerve root encroachment. There is also evidence of left L3 nerve root cut off near the entrance to the neural foramen. Spinal stenosis at this level does not appear significantly changed with upright positioning. A shallow ventral extradural defect is present at L4-5. CT LUMBAR MYELOGRAM FINDINGS: There is very mild thoracolumbar dextroscoliosis. There is no listhesis. Vertebral body heights are preserved without evidence of fracture or destructive osseous lesion. The conus medullaris terminates at T12-L1. The diffuse atherosclerotic  calcification is noted of the abdominal aorta and its major branch vessels. There is mild ectasia of the infrarenal abdominal aorta. T12-L1:  Negative. L1-2:  Mild disc bulging without stenosis. L2-3:  Mild disc bulging without stenosis. L3-4: Mild-to-moderate disc space narrowing. Circumferential disc bulging greater to the left and mild-to-moderate facet and ligamentum flavum hypertrophy result in mild-to-moderate spinal stenosis, mild right and mild-to-moderate left lateral recess stenosis. Additionally, there is a left foraminal disc extrusion which results in severe medial foraminal stenosis and left L3 nerve root impingement. L4-5: Mild circumferential disc bulging and mild facet and ligamentum flavum hypertrophy result in mild right neural foraminal stenosis and mild ventral thecal sac flattening without significant spinal stenosis. L5-S1:  Mild disc bulging greater to the right without stenosis. IMPRESSION: 1. L3-4 disc extrusion resulting in severe left neural foraminal stenosis and left L3 nerve root impingement. 2. Mild-to-moderate multifactorial spinal and left greater than right lateral recess stenosis at L3-4. 3. Mild disc and facet degeneration elsewhere without stenosis. Electronically Signed   By: Logan Bores  M.D.   On: 09/28/2016 14:08   Dg Lumbar Spine 1 View  Result Date: 10/13/2016 CLINICAL DATA:  L3-4 interbody fusion EXAM: LUMBAR SPINE - 1 VIEW COMPARISON:  Myelogram 09/28/2016 FINDINGS: Initial lateral film shows the lower needle at the spinous process of L3 an the upper needle at the interspinous space of L1-2. IMPRESSION: Spinous process at L3 and interspinous space L1-2 localized. Electronically Signed   By: Nelson Chimes M.D.   On: 10/13/2016 15:10   Dg Chest Port 1 View  Result Date: 10/13/2016 CLINICAL DATA:  Weakness EXAM: PORTABLE CHEST 1 VIEW COMPARISON:  10/08/2016 FINDINGS: Left-sided pacing device, similar compared to prior. No acute infiltrate or effusion. Stable  cardiomediastinal silhouette with atherosclerosis. No pneumothorax. IMPRESSION: No radiographic evidence for acute cardiopulmonary abnormality Electronically Signed   By: Donavan Foil M.D.   On: 10/13/2016 20:42   Dg C-arm 1-60 Min  Result Date: 10/13/2016 CLINICAL DATA:  L3-4 TLIF EXAM: DG C-ARM 61-120 MIN; LUMBAR SPINE - 2-3 VIEW COMPARISON:  None. FINDINGS: 45 seconds of fluoroscopic time utilized for talar half of the elbow 3 4 interspace. Hardware noted within the pedicles of L3 and L4 without breech of vertebral endplates. Interbody disc noted at L3-4. IMPRESSION: L3-4 transforaminal lumbar interbody fusion. Electronically Signed   By: Ashley Royalty M.D.   On: 10/13/2016 17:58   Dg Myelography Lumbar Inj Lumbosacral  Result Date: 09/28/2016 CLINICAL DATA:  Lumbar radiculopathy. Low back and left lower extremity pain. EXAM: LUMBAR MYELOGRAM FLUOROSCOPY TIME:  Radiation Exposure Index (as provided by the fluoroscopic device): 267.44 microGray*m^2 Fluoroscopy Time (in minutes and seconds):  27 seconds PROCEDURE: After thorough discussion of risks and benefits of the procedure including bleeding, infection, injury to nerves, blood vessels, adjacent structures as well as headache and CSF leak, written and oral informed consent was obtained. Consent was obtained by Dr. Logan Bores. Time out form was completed. Patient was positioned prone on the fluoroscopy table. Local anesthesia was provided with 1% lidocaine without epinephrine after prepped and draped in the usual sterile fashion. Puncture was performed at L4-5 using a 3 1/2 inch 22-gauge spinal needle via a right interlaminar approach. Using a single pass through the dura, the needle was placed within the thecal sac, with return of clear CSF. 15 mL of Isovue M 200 was injected into the thecal sac, with normal opacification of the nerve roots and cauda equina consistent with free flow within the subarachnoid space. I personally performed the lumbar  puncture and administered the intrathecal contrast. I also personally supervised acquisition of the myelogram images. TECHNIQUE: Contiguous axial images were obtained through the Lumbar spine after the intrathecal infusion of infusion. Coronal and sagittal reconstructions were obtained of the axial image sets. COMPARISON:  Lumbar spine radiographs 08/27/2016 FINDINGS: LUMBAR MYELOGRAM FINDINGS: Lumbar segmentation is normal. There is very mild thoracolumbar dextroscoliosis without listhesis or evidence of dynamic instability on upright flexion and extension images. Disc space narrowing and degenerative endplate sclerosis and osteophytosis are present at L3-4. There is mild-to-moderate waist like narrowing of the thecal sac at this level with asymmetric left lateral recess stenosis and left L4 nerve root encroachment. There is also evidence of left L3 nerve root cut off near the entrance to the neural foramen. Spinal stenosis at this level does not appear significantly changed with upright positioning. A shallow ventral extradural defect is present at L4-5. CT LUMBAR MYELOGRAM FINDINGS: There is very mild thoracolumbar dextroscoliosis. There is no listhesis. Vertebral body heights are preserved without evidence  of fracture or destructive osseous lesion. The conus medullaris terminates at T12-L1. The diffuse atherosclerotic calcification is noted of the abdominal aorta and its major branch vessels. There is mild ectasia of the infrarenal abdominal aorta. T12-L1:  Negative. L1-2:  Mild disc bulging without stenosis. L2-3:  Mild disc bulging without stenosis. L3-4: Mild-to-moderate disc space narrowing. Circumferential disc bulging greater to the left and mild-to-moderate facet and ligamentum flavum hypertrophy result in mild-to-moderate spinal stenosis, mild right and mild-to-moderate left lateral recess stenosis. Additionally, there is a left foraminal disc extrusion which results in severe medial foraminal stenosis  and left L3 nerve root impingement. L4-5: Mild circumferential disc bulging and mild facet and ligamentum flavum hypertrophy result in mild right neural foraminal stenosis and mild ventral thecal sac flattening without significant spinal stenosis. L5-S1:  Mild disc bulging greater to the right without stenosis. IMPRESSION: 1. L3-4 disc extrusion resulting in severe left neural foraminal stenosis and left L3 nerve root impingement. 2. Mild-to-moderate multifactorial spinal and left greater than right lateral recess stenosis at L3-4. 3. Mild disc and facet degeneration elsewhere without stenosis. Electronically Signed   By: Logan Bores M.D.   On: 09/28/2016 14:08     CBC  Recent Labs Lab 10/08/16 1519 10/13/16 2049  WBC 8.5 11.0*  HGB 13.7 11.7*  HCT 40.5 34.4*  PLT 139* 107*  MCV 100.7* 100.6*  MCH 34.1* 34.2*  MCHC 33.8 34.0  RDW 12.6 12.9  LYMPHSABS 1.7 0.5*  MONOABS 0.7 0.3  EOSABS 0.2 0.0  BASOSABS 0.0 0.0    Chemistries   Recent Labs Lab 10/08/16 1519 10/13/16 2049  NA 142 137  K 4.2 4.2  CL 108 102  CO2 27 28  GLUCOSE 93 148*  BUN 21* 14  CREATININE 0.97 0.94  CALCIUM 9.7 9.0  AST 26 28  ALT 18 17  ALKPHOS 62 57  BILITOT 0.5 0.8   ------------------------------------------------------------------------------------------------------------------ estimated creatinine clearance is 65.3 mL/min (by C-G formula based on SCr of 0.94 mg/dL). ------------------------------------------------------------------------------------------------------------------ No results for input(s): HGBA1C in the last 72 hours. ------------------------------------------------------------------------------------------------------------------ No results for input(s): CHOL, HDL, LDLCALC, TRIG, CHOLHDL, LDLDIRECT in the last 72 hours. ------------------------------------------------------------------------------------------------------------------ No results for input(s): TSH, T4TOTAL, T3FREE,  THYROIDAB in the last 72 hours.  Invalid input(s): FREET3 ------------------------------------------------------------------------------------------------------------------ No results for input(s): VITAMINB12, FOLATE, FERRITIN, TIBC, IRON, RETICCTPCT in the last 72 hours.  Coagulation profile  Recent Labs Lab 10/08/16 1519 10/13/16 0953  INR 2.43 1.23     Recent Labs  10/13/16 2049  DDIMER 1.35*    Cardiac Enzymes  Recent Labs Lab 10/13/16 2049 10/14/16 0228 10/14/16 0741  TROPONINI <0.03 <0.03 <0.03   ------------------------------------------------------------------------------------------------------------------ Invalid input(s): POCBNP   CBG:  Recent Labs Lab 10/13/16 1717 10/13/16 1851 10/13/16 2200 10/14/16 0638  GLUCAP 91 81 132* 173*       Studies: Dg Lumbar Spine 2-3 Views  Result Date: 10/13/2016 CLINICAL DATA:  L3-4 TLIF EXAM: DG C-ARM 61-120 MIN; LUMBAR SPINE - 2-3 VIEW COMPARISON:  None. FINDINGS: 45 seconds of fluoroscopic time utilized for talar half of the elbow 3 4 interspace. Hardware noted within the pedicles of L3 and L4 without breech of vertebral endplates. Interbody disc noted at L3-4. IMPRESSION: L3-4 transforaminal lumbar interbody fusion. Electronically Signed   By: Ashley Royalty M.D.   On: 10/13/2016 17:58   Ct Head Wo Contrast  Result Date: 10/13/2016 CLINICAL DATA:  Bilateral lower extremity weakness EXAM: CT HEAD WITHOUT CONTRAST TECHNIQUE: Contiguous axial images were obtained from the base of the  skull through the vertex without intravenous contrast. COMPARISON:  Head CT 02/21/2014 FINDINGS: Brain: No mass lesion, intraparenchymal hemorrhage or extra-axial collection. No evidence of acute cortical infarct. There is confluent periventricular hypoattenuation compatible with chronic microvascular disease. Vascular: No hyperdense vessel or unexpected calcification. Skull: Normal visualized skull base, calvarium and extracranial soft  tissues. Sinuses/Orbits: No sinus fluid levels or advanced mucosal thickening. No mastoid effusion. Normal orbits. IMPRESSION: 1. No acute intracranial abnormality. 2. Advanced chronic microvascular ischemia. Electronically Signed   By: Ulyses Jarred M.D.   On: 10/13/2016 22:02   Dg Lumbar Spine 1 View  Result Date: 10/13/2016 CLINICAL DATA:  L3-4 interbody fusion EXAM: LUMBAR SPINE - 1 VIEW COMPARISON:  Myelogram 09/28/2016 FINDINGS: Initial lateral film shows the lower needle at the spinous process of L3 an the upper needle at the interspinous space of L1-2. IMPRESSION: Spinous process at L3 and interspinous space L1-2 localized. Electronically Signed   By: Nelson Chimes M.D.   On: 10/13/2016 15:10   Dg Chest Port 1 View  Result Date: 10/13/2016 CLINICAL DATA:  Weakness EXAM: PORTABLE CHEST 1 VIEW COMPARISON:  10/08/2016 FINDINGS: Left-sided pacing device, similar compared to prior. No acute infiltrate or effusion. Stable cardiomediastinal silhouette with atherosclerosis. No pneumothorax. IMPRESSION: No radiographic evidence for acute cardiopulmonary abnormality Electronically Signed   By: Donavan Foil M.D.   On: 10/13/2016 20:42   Dg C-arm 1-60 Min  Result Date: 10/13/2016 CLINICAL DATA:  L3-4 TLIF EXAM: DG C-ARM 61-120 MIN; LUMBAR SPINE - 2-3 VIEW COMPARISON:  None. FINDINGS: 45 seconds of fluoroscopic time utilized for talar half of the elbow 3 4 interspace. Hardware noted within the pedicles of L3 and L4 without breech of vertebral endplates. Interbody disc noted at L3-4. IMPRESSION: L3-4 transforaminal lumbar interbody fusion. Electronically Signed   By: Ashley Royalty M.D.   On: 10/13/2016 17:58      Lab Results  Component Value Date   HGBA1C 5.9 (H) 10/08/2016   HGBA1C 5.8 08/06/2015   HGBA1C 6.0 08/09/2014   Lab Results  Component Value Date   MICROALBUR 1.3 08/09/2014   LDLCALC 122 (H) 08/06/2015   CREATININE 0.94 10/13/2016       Scheduled Meds: . calcium-vitamin D  1 tablet  Oral BID  . docusate sodium  100 mg Oral BID  . donepezil  10 mg Oral QHS  . gabapentin  300 mg Oral TID  . iopamidol      . iopamidol      . memantine  10 mg Oral BID  . multivitamin with minerals  1 tablet Oral Daily  . sodium chloride flush  3 mL Intravenous Q12H  . vitamin C  500 mg Oral QHS   Continuous Infusions: . sodium chloride    . 0.9 % NaCl with KCl 20 mEq / L 10 mL/hr at 10/13/16 2201     LOS: 1 day    Time spent: >30 MINS    [facility] Pager [phone]. If 7PM-7AM, please contact night-coverage at [email or web address], password [facility] 10/14/2016, 9:57 AM  LOS: 1 day

## 2016-10-15 ENCOUNTER — Inpatient Hospital Stay (HOSPITAL_COMMUNITY): Payer: Medicare Other

## 2016-10-15 DIAGNOSIS — R55 Syncope and collapse: Secondary | ICD-10-CM

## 2016-10-15 DIAGNOSIS — R29898 Other symptoms and signs involving the musculoskeletal system: Secondary | ICD-10-CM

## 2016-10-15 LAB — TROPONIN I: Troponin I: <0.03 ng/mL (ref <0.03)

## 2016-10-15 LAB — COMPREHENSIVE METABOLIC PANEL
ALBUMIN: 3.1 g/dL — AB (ref 3.5–5.0)
ALT: 13 U/L — ABNORMAL LOW (ref 17–63)
AST: 34 U/L (ref 15–41)
Alkaline Phosphatase: 45 U/L (ref 38–126)
Anion gap: 8 (ref 5–15)
BILIRUBIN TOTAL: 0.7 mg/dL (ref 0.3–1.2)
BUN: 15 mg/dL (ref 6–20)
CO2: 26 mmol/L (ref 22–32)
Calcium: 8.8 mg/dL — ABNORMAL LOW (ref 8.9–10.3)
Chloride: 105 mmol/L (ref 101–111)
Creatinine, Ser: 1.06 mg/dL (ref 0.61–1.24)
GFR calc Af Amer: >60 mL/min (ref >60)
GFR calc non Af Amer: >60 mL/min (ref >60)
GLUCOSE: 92 mg/dL (ref 65–99)
POTASSIUM: 4.3 mmol/L (ref 3.5–5.1)
Sodium: 139 mmol/L (ref 135–145)
TOTAL PROTEIN: 5.1 g/dL — AB (ref 6.5–8.1)

## 2016-10-15 LAB — GLUCOSE, CAPILLARY
Glucose-Capillary: 86 mg/dL (ref 65–99)
Glucose-Capillary: 85 mg/dL (ref 65–99)
Glucose-Capillary: 91 mg/dL (ref 65–99)
Glucose-Capillary: 132 mg/dL — ABNORMAL HIGH (ref 65–99)

## 2016-10-15 LAB — CBC
HEMATOCRIT: 28.6 % — AB (ref 39.0–52.0)
Hemoglobin: 9.7 g/dL — ABNORMAL LOW (ref 13.0–17.0)
MCH: 34.4 pg — ABNORMAL HIGH (ref 26.0–34.0)
MCHC: 33.9 g/dL (ref 30.0–36.0)
MCV: 101.4 fL — ABNORMAL HIGH (ref 78.0–100.0)
Platelets: 96 10*3/uL — ABNORMAL LOW (ref 150–400)
RBC: 2.82 MIL/uL — ABNORMAL LOW (ref 4.22–5.81)
RDW: 12.9 % (ref 11.5–15.5)
WBC: 10.1 10*3/uL (ref 4.0–10.5)

## 2016-10-15 LAB — LACTIC ACID, PLASMA
LACTIC ACID, VENOUS: 1.6 mmol/L (ref 0.5–1.9)
Lactic Acid, Venous: 1 mmol/L (ref 0.5–1.9)

## 2016-10-15 LAB — ECHOCARDIOGRAM COMPLETE
HEIGHTINCHES: 66 in
WEIGHTICAEL: 2716.07 [oz_av]

## 2016-10-15 MED ORDER — SODIUM CHLORIDE 0.9 % IV BOLUS (SEPSIS): 500.0000 mL | Freq: Once | INTRAVENOUS | Status: DC

## 2016-10-15 MED ORDER — METOPROLOL TARTRATE 25 MG PO TABS: 12.5000 mg | ORAL_TABLET | Freq: Two times a day (BID) | ORAL | 0 refills | Status: DC

## 2016-10-15 MED ORDER — HYDROCORTISONE NA SUCCINATE PF 100 MG IJ SOLR: 50.0000 mg | Freq: Three times a day (TID) | INTRAMUSCULAR | Status: DC

## 2016-10-15 MED ORDER — HYDROCORTISONE NA SUCCINATE PF 100 MG IJ SOLR
50.0000 mg | Freq: Three times a day (TID) | INTRAMUSCULAR | Status: DC
  Administered 2016-10-15 – 2016-10-16 (×2): 50 mg via INTRAVENOUS
  Filled 2016-10-15 (×2): qty 2

## 2016-10-15 MED ORDER — SODIUM CHLORIDE 0.9 % IV BOLUS (SEPSIS)
1000.0000 mL | Freq: Once | INTRAVENOUS | Status: AC
  Administered 2016-10-15: 1000 mL via INTRAVENOUS

## 2016-10-15 MED ORDER — HYDROCORTISONE NA SUCCINATE PF 100 MG IJ SOLR
100.0000 mg | Freq: Once | INTRAMUSCULAR | Status: AC
  Administered 2016-10-15: 100 mg via INTRAVENOUS
  Filled 2016-10-15: qty 2

## 2016-10-15 NOTE — Progress Notes (Signed)
Echocardiogram 2D Echocardiogram has been performed.  Tanner Hensley 10/15/2016, 4:30 PM

## 2016-10-15 NOTE — Progress Notes (Signed)
Subjective: Interval History:  76 years old male s/p L4/L5 decompression and fusion. He was initially paraplegic however improved significantly. He had a syncopal episode after which he had difficulty moving his legs. He reports back to normal and much improved compared with prior to surgery. Denied any new complaints.  Objective: Vital signs in last 24 hours: Temp:  [98.1 F (36.7 C)-100.4 F (38 C)] 100.4 F (38 C) (02/09 0855) Pulse Rate:  [80-85] 80 (02/09 0855) Resp:  [17-20] 19 (02/09 0855) BP: (89-115)/(42-65) 111/42 (02/09 0855) SpO2:  [94 %-100 %] 100 % (02/09 0855)  Intake/Output from previous day: 02/08 0701 - 02/09 0700 In: 326 [P.O.:320; I.V.:6] Out: 1000 [Urine:1000] Intake/Output this shift: No intake/output data recorded. Nutritional status: Diet Carb Modified Fluid consistency: Thin; Room service appropriate? Yes  HEENT-  Normocephalic, no lesions, Neck supple and no rigidity was appreciated. Cardiovascular - regular rate and rhythm, S1, S2 normal, no murmur, click, rub or gallop Lungs - chest clear, no wheezing, rales, normal symmetric air entry, Heart exam - S1, S2 normal, no murmur, no gallop, rate regular Abdomen - soft nontender and nondistended  Neurologic Examination: Mental status: Awake alert and oriented to all spheres. Speech and language: No evidence of dysarthria was appreciated. Comprehension intact. Naming intact. Fluent. Cranial nerves: Pupils approximately 2 mm and reactive to light.  Extra muscles intact. Facial sensation symmetric. No facial droop is appreciated. Hearing intact. Uvula midline. Tongue midline. Motor: 5/5 in upper extremities. Bilateral hip flexion is 4/5 but due to pain. Mild residual weakness of knee flexion bilaterally likely due to decondition. Sensory: Normal sensation to light touch Coordination: Normal finger to nose Gait: Deferred  Lab Results:  Recent Labs  10/13/16 2049 10/15/16 0221  WBC 11.0* 10.1  HGB 11.7*  9.7*  HCT 34.4* 28.6*  PLT 107* 96*  NA 137 139  K 4.2 4.3  CL 102 105  CO2 28 26  GLUCOSE 148* 92  BUN 14 15  CREATININE 0.94 1.06  CALCIUM 9.0 8.8*   Lipid Panel No results for input(s): CHOL, TRIG, HDL, CHOLHDL, VLDL, LDLCALC in the last 72 hours.  Studies/Results: Ct Angio Head W Or Wo Contrast  Result Date: 10/14/2016 CLINICAL DATA:  Lower extremity weakness and syncopal episode. EXAM: CT ANGIOGRAPHY HEAD AND NECK TECHNIQUE: Multidetector CT imaging of the head and neck was performed using the standard protocol during bolus administration of intravenous contrast. Multiplanar CT image reconstructions and MIPs were obtained to evaluate the vascular anatomy. Carotid stenosis measurements (when applicable) are obtained utilizing NASCET criteria, using the distal internal carotid diameter as the denominator. CONTRAST:  50 mL Isovue 370 COMPARISON:  Noncontrast head CT 10/13/2016 FINDINGS: CTA NECK FINDINGS Aortic arch: Normal variant aortic arch branching with common origin of the brachiocephalic and left common carotid arteries. There is aneurysmal dilatation of the aortic arch, incompletely imaged and more fully evaluated on concurrent chest CTA. Moderate predominantly calcified plaque in the aortic arch. Mild narrowing of the right subclavian artery near its origin. Right carotid system: Mild scattered calcified and soft plaque in the common carotid artery without stenosis. Moderate calcified plaque at the carotid bifurcation and in the proximal ICA without significant stenosis. Tortuous and ectatic distal cervical ICA. Left carotid system: Common carotid artery demonstrates mild atherosclerotic luminal irregularity without stenosis. There is moderate predominantly calcified plaque at the carotid bifurcation and in the proximal ICA without significant stenosis. Tortuous and ectatic distal cervical ICA. Vertebral arteries: Patent with the right being moderately to strongly dominant.  Calcified  plaque at the right vertebral artery origin results in moderate stenosis. Skeleton: Advanced disc degeneration at C3-4 and C4-5. Advanced left facet arthrosis at C2-3. Other neck: No mass or lymph node enlargement. Upper chest: More fully evaluated on concurrent chest CTA. Review of the MIP images confirms the above findings CTA HEAD FINDINGS Anterior circulation: The internal carotid arteries are patent from the skullbase to carotid termini with moderately advanced siphon calcified atherosclerosis bilaterally resulting in mild ophthalmic segment/ proximal supraclinoid stenosis bilaterally. ACAs and MCAs are patent without evidence of major branch occlusion. There is mild left M1 stenosis, and there is moderate branch vessel atherosclerotic irregularity and narrowing throughout the anterior circulation. No aneurysm. Posterior circulation: Intracranial vertebral arteries are patent to the basilar with the right being dominant. PICA and SCA origins are patent. Basilar artery is patent with mild luminal irregularity but no stenosis. There is a fetal origin of the left PCA. There is a severe right PCA stenosis near the P1-P2 junction, with diffuse irregularity and up to moderate stenosis involving the right P2 segment. There is also a moderate to severe distal left P2 stenosis, and there is moderate disease affecting the P3 segments and more distal branch vessels bilaterally. No aneurysm. Venous sinuses: Patent. Anatomic variants: None. Delayed phase: No abnormal enhancement. Review of the MIP images confirms the above findings IMPRESSION: 1. No large vessel occlusion. 2. Moderately advanced intracranial atherosclerosis. Mild bilateral ICA stenosis. 3. Severe proximal right PCA stenosis. 4. Moderate bilateral cervical carotid artery atherosclerosis without stenosis. 5. Moderate proximal right vertebral artery stenosis. 6. Thoracic aortic aneurysm.  See separate chest CTA. Electronically Signed   By: Logan Bores M.D.    On: 10/14/2016 12:07   Dg Lumbar Spine 2-3 Views  Result Date: 10/13/2016 CLINICAL DATA:  L3-4 TLIF EXAM: DG C-ARM 61-120 MIN; LUMBAR SPINE - 2-3 VIEW COMPARISON:  None. FINDINGS: 45 seconds of fluoroscopic time utilized for talar half of the elbow 3 4 interspace. Hardware noted within the pedicles of L3 and L4 without breech of vertebral endplates. Interbody disc noted at L3-4. IMPRESSION: L3-4 transforaminal lumbar interbody fusion. Electronically Signed   By: Ashley Royalty M.D.   On: 10/13/2016 17:58   Ct Head Wo Contrast  Result Date: 10/13/2016 CLINICAL DATA:  Bilateral lower extremity weakness EXAM: CT HEAD WITHOUT CONTRAST TECHNIQUE: Contiguous axial images were obtained from the base of the skull through the vertex without intravenous contrast. COMPARISON:  Head CT 02/21/2014 FINDINGS: Brain: No mass lesion, intraparenchymal hemorrhage or extra-axial collection. No evidence of acute cortical infarct. There is confluent periventricular hypoattenuation compatible with chronic microvascular disease. Vascular: No hyperdense vessel or unexpected calcification. Skull: Normal visualized skull base, calvarium and extracranial soft tissues. Sinuses/Orbits: No sinus fluid levels or advanced mucosal thickening. No mastoid effusion. Normal orbits. IMPRESSION: 1. No acute intracranial abnormality. 2. Advanced chronic microvascular ischemia. Electronically Signed   By: Ulyses Jarred M.D.   On: 10/13/2016 22:02   Ct Angio Neck W Or Wo Contrast  Result Date: 10/14/2016 CLINICAL DATA:  Lower extremity weakness and syncopal episode. EXAM: CT ANGIOGRAPHY HEAD AND NECK TECHNIQUE: Multidetector CT imaging of the head and neck was performed using the standard protocol during bolus administration of intravenous contrast. Multiplanar CT image reconstructions and MIPs were obtained to evaluate the vascular anatomy. Carotid stenosis measurements (when applicable) are obtained utilizing NASCET criteria, using the distal  internal carotid diameter as the denominator. CONTRAST:  50 mL Isovue 370 COMPARISON:  Noncontrast head CT 10/13/2016 FINDINGS: CTA  NECK FINDINGS Aortic arch: Normal variant aortic arch branching with common origin of the brachiocephalic and left common carotid arteries. There is aneurysmal dilatation of the aortic arch, incompletely imaged and more fully evaluated on concurrent chest CTA. Moderate predominantly calcified plaque in the aortic arch. Mild narrowing of the right subclavian artery near its origin. Right carotid system: Mild scattered calcified and soft plaque in the common carotid artery without stenosis. Moderate calcified plaque at the carotid bifurcation and in the proximal ICA without significant stenosis. Tortuous and ectatic distal cervical ICA. Left carotid system: Common carotid artery demonstrates mild atherosclerotic luminal irregularity without stenosis. There is moderate predominantly calcified plaque at the carotid bifurcation and in the proximal ICA without significant stenosis. Tortuous and ectatic distal cervical ICA. Vertebral arteries: Patent with the right being moderately to strongly dominant. Calcified plaque at the right vertebral artery origin results in moderate stenosis. Skeleton: Advanced disc degeneration at C3-4 and C4-5. Advanced left facet arthrosis at C2-3. Other neck: No mass or lymph node enlargement. Upper chest: More fully evaluated on concurrent chest CTA. Review of the MIP images confirms the above findings CTA HEAD FINDINGS Anterior circulation: The internal carotid arteries are patent from the skullbase to carotid termini with moderately advanced siphon calcified atherosclerosis bilaterally resulting in mild ophthalmic segment/ proximal supraclinoid stenosis bilaterally. ACAs and MCAs are patent without evidence of major branch occlusion. There is mild left M1 stenosis, and there is moderate branch vessel atherosclerotic irregularity and narrowing throughout the  anterior circulation. No aneurysm. Posterior circulation: Intracranial vertebral arteries are patent to the basilar with the right being dominant. PICA and SCA origins are patent. Basilar artery is patent with mild luminal irregularity but no stenosis. There is a fetal origin of the left PCA. There is a severe right PCA stenosis near the P1-P2 junction, with diffuse irregularity and up to moderate stenosis involving the right P2 segment. There is also a moderate to severe distal left P2 stenosis, and there is moderate disease affecting the P3 segments and more distal branch vessels bilaterally. No aneurysm. Venous sinuses: Patent. Anatomic variants: None. Delayed phase: No abnormal enhancement. Review of the MIP images confirms the above findings IMPRESSION: 1. No large vessel occlusion. 2. Moderately advanced intracranial atherosclerosis. Mild bilateral ICA stenosis. 3. Severe proximal right PCA stenosis. 4. Moderate bilateral cervical carotid artery atherosclerosis without stenosis. 5. Moderate proximal right vertebral artery stenosis. 6. Thoracic aortic aneurysm.  See separate chest CTA. Electronically Signed   By: Logan Bores M.D.   On: 10/14/2016 12:07   Ct Angio Chest Pe W Or Wo Contrast  Result Date: 10/14/2016 CLINICAL DATA:  Syncopal episode last night. Lower extremity weakness. Strength has improved overnight. 10/13/2016 EXAM: CT ANGIOGRAPHY CHEST WITH CONTRAST TECHNIQUE: Multidetector CT imaging of the chest was performed using the standard protocol during bolus administration of intravenous contrast. Multiplanar CT image reconstructions and MIPs were obtained to evaluate the vascular anatomy. CONTRAST:  80 cc Isovue 370 COMPARISON:  Chest x-ray 10/13/2016 FINDINGS: Cardiovascular: Coronary stents and coronary artery calcifications are present. The pulmonary arteries are well opacified. There is no acute pulmonary embolus. Left-sided transvenous pacemaker leads to the right atrium and right ventricle.  There is atherosclerotic calcification of the mitral annulus. There is atherosclerotic calcification of the thoracic aorta. Ascending aorta is 4.4 cm. Mediastinum/Nodes: Precarinal lymph node is 1.4 cm. Right paratracheal lymph node is 1.3 cm. Right hilar node is 1.3 cm. Left hilar node is 1.6 cm. Small hiatal hernia. Lungs/Pleura: There are focal areas  of air trapping. Minimal bibasilar atelectasis. No suspicious pulmonary nodules. No focal consolidations. Trace pleural effusions. Upper Abdomen: Hiatal hernia.  Otherwise unremarkable. Musculoskeletal: Thoracic osteophytes are present. No acute osseous abnormality. Review of the MIP images confirms the above findings. IMPRESSION: 1.  No evidence for acute  abnormality. 2. Coronary artery disease. 3. No evidence for acute pulmonary embolus. 4. Small mediastinal and hilar lymph nodes may be reactive given the history of CHF. 5. Mild mosaic appearance of the lungs consistent with air trapping. Trace pleural effusions. 6. Dilated ascending aorta. Recommend annual imaging followup by CTA or MRA. This recommendation follows 2010 ACCF/AHA/AATS/ACR/ASA/SCA/SCAI/SIR/STS/SVM Guidelines for the Diagnosis and Management of Patients with Thoracic Aortic Disease. Circulation. 2010; [id number] New Electronically Signed   By: Nolon Nations M.D.   On: 10/14/2016 11:56   Ct Lumbar Spine Wo Contrast  Result Date: 10/14/2016 CLINICAL DATA:  Syncopal episode and lower extremity weakness. Lumbar fusion knee yesterday. EXAM: CT LUMBAR SPINE WITHOUT CONTRAST TECHNIQUE: Multidetector CT imaging of the lumbar spine was performed without intravenous contrast administration. Multiplanar CT image reconstructions were also generated. COMPARISON:  Lumbar CT myelogram 09/28/2016 FINDINGS: Segmentation: 5 lumbar type vertebrae. Alignment: Slight thoracolumbar dextroscoliosis, unchanged. No listhesis. Vertebrae: Sequelae of interval L3-4 decompression and fusion are identified. Pedicle  screws and interconnecting rods are present bilaterally. The left L4 screw terminates just lateral to the vertebral body. An interbody graft is in place. No acute fracture is identified. Vertebral body heights are preserved. Paraspinal and other soft tissues: Postoperative changes in the posterior soft tissues related to recent surgery with gas and unorganized fluid which also extends into the left epidural space and neural foramen at L3-4. Disc levels: Interval postoperative changes at L3-4 with left greater than right laminectomies and partial facetectomies. Limited assessment of the spinal canal due to postoperative changes and metallic streak artifact. There is mild-to-moderate right neural foraminal stenosis at L3-4 due to disc bulging and spurring, unchanged. Mild disc bulging elsewhere throughout the lumbar spine is similar to the recent myelogram. IMPRESSION: 1. Interval postoperative changes from recent L3-4 decompression and fusion as above. 2. No evidence of acute osseous abnormality. Electronically Signed   By: Logan Bores M.D.   On: 10/14/2016 12:30   Dg Lumbar Spine 1 View  Result Date: 10/13/2016 CLINICAL DATA:  L3-4 interbody fusion EXAM: LUMBAR SPINE - 1 VIEW COMPARISON:  Myelogram 09/28/2016 FINDINGS: Initial lateral film shows the lower needle at the spinous process of L3 an the upper needle at the interspinous space of L1-2. IMPRESSION: Spinous process at L3 and interspinous space L1-2 localized. Electronically Signed   By: Nelson Chimes M.D.   On: 10/13/2016 15:10   Dg Chest Port 1 View  Result Date: 10/13/2016 CLINICAL DATA:  Weakness EXAM: PORTABLE CHEST 1 VIEW COMPARISON:  10/08/2016 FINDINGS: Left-sided pacing device, similar compared to prior. No acute infiltrate or effusion. Stable cardiomediastinal silhouette with atherosclerosis. No pneumothorax. IMPRESSION: No radiographic evidence for acute cardiopulmonary abnormality Electronically Signed   By: Donavan Foil M.D.   On:  10/13/2016 20:42   Dg C-arm 1-60 Min  Result Date: 10/13/2016 CLINICAL DATA:  L3-4 TLIF EXAM: DG C-ARM 61-120 MIN; LUMBAR SPINE - 2-3 VIEW COMPARISON:  None. FINDINGS: 45 seconds of fluoroscopic time utilized for talar half of the elbow 3 4 interspace. Hardware noted within the pedicles of L3 and L4 without breech of vertebral endplates. Interbody disc noted at L3-4. IMPRESSION: L3-4 transforaminal lumbar interbody fusion. Electronically Signed   By: Ashley Royalty  M.D.   On: 10/13/2016 17:58    Medications:  Scheduled: . calcium-vitamin D  1 tablet Oral BID  . docusate sodium  100 mg Oral BID  . donepezil  10 mg Oral QHS  . gabapentin  300 mg Oral TID  . memantine  10 mg Oral BID  . multivitamin with minerals  1 tablet Oral Daily  . sodium chloride flush  3 mL Intravenous Q12H  . vitamin C  500 mg Oral QHS   Continuous: . sodium chloride    . 0.9 % NaCl with KCl 20 mEq / L Stopped (10/14/16 2340)   [id number] **OR** acetaminophen, alum & mag hydroxide-simeth, bisacodyl, diazepam, hydrALAZINE, HYDROmorphone (DILAUDID) injection, HYDROmorphone, menthol-cetylpyridinium **OR** phenol, ondansetron (ZOFRAN) IV, senna, senna-docusate, sodium chloride flush, sodium phosphate  Assessment/Plan: 76 years old male with orthostatic hypotension however had transient bilateral leg weakness. Unclear if due to hypoperfusion although CTA is negative. TIA would not cause bilateral leg weakness. He had been deconditioned for the past 8 weeks and unclear if generalized hypoperfusion affected his legs with drop in his BP. Unable to get MRI due to pacemaker. At this time his examination is back to baseline. If he has further episodes may need to pursue further imaging of spine and possibly spinal angiogram. He does need follow up of thoracic aorta dilatation.  No further recommendations from Neurology. Please call with questions.   LOS: 2 days   Rochele Raring

## 2016-10-15 NOTE — Progress Notes (Signed)
Pt left unit for Echo.  

## 2016-10-15 NOTE — Progress Notes (Addendum)
    Patient doing well from orthopaedic standpoint and has been cleared by neurology. He remains hypotensive and has not yet cleared OT for safe D/C home, will cont to have IM follow BP and OT continue to follow. Likely D/C Sat vs Sunday pending BP and PT/OT progress. Most recent BP 84/34, IM signed off for D/C home earlier today however we are uncomfortable discharging pt home with 84/34 BP and historical orthostatic hypotension syncopal episode. Pt has also not yet cleared OT. Dr. Lynann Bologna does not feel pt is demonstrating symptoms of anemia and does not feel the need for blood transfusion at this time for Hg 9.7 asymptomatic pt.   Pricilla Holm, PA-C

## 2016-10-15 NOTE — Care Management Note (Signed)
Case Management Note  Patient Details  Name: Tanner Hensley MRN: [id number] Date of Birth: 14-Aug-1941  Subjective/Objective:                    Action/Plan: Plan when pt is ready for d/c is home with [facility] services. CM met with the patient and his wife and provided a list of [place] agencies. They have used [facility] in the past and would like to use them again. Mary with [facility] notified and accepted the referral. Pt also with orders for hospital bed. CM notified Jermaine with [facility] and he will arrange for the bed to be delivered to the home.   Expected Discharge Date:                  Expected Discharge Plan:  [place]  In-House Referral:     Discharge planning Services  CM Consult  Post Acute Care Choice:  Durable Medical Equipment, Home Health Choice offered to:  Patient, Spouse  DME Arranged:  Hospital bed DME Agency:  [place]:  PT [place]:  [facility] (now [facility])  Status of Service:  Completed, signed off  If discussed at [place] of Stay Meetings, dates discussed:    Additional Comments:  Pollie Friar, RN 10/15/2016, 2:57 PM

## 2016-10-15 NOTE — Progress Notes (Signed)
Physical Therapy Treatment Patient Details Name: Tanner Hensley MRN: [id number] DOB: [birth year] Today's Date: 10/15/2016    History of Present Illness Tanner Hensley is a 76 y.o. male who presents with ongoing pain in the left leg, severe. Left-sided L3-4 transforaminal lumbar interbody fusion. Right-sided L3-4 posterolateral fusion.    PT Comments    Pt tolerated session well with no c/o of increased pain during LE exercises and transfers. Donned back brace in sitting prior to transfers. Pt unable to recall any of three back precautions. Reviewed no bending, lifting or twisting and appropriate way of getting OOB using log roll. Pt able to teach back proper use of incentive spirometer at end of session. Incorporate ambulation and stair training next session if pt allows and no severe drops in BP are noted.    Follow Up Recommendations  Home health PT;Supervision for mobility/OOB     Equipment Recommendations  None recommended by PT    Recommendations for Other Services       Precautions / Restrictions Precautions Precautions: Back Precaution Comments: pt unable to recall back precautions; reviewed today Required Braces or Orthoses: Spinal Brace Spinal Brace: Thoracolumbosacral orthotic;Applied in sitting position    Mobility  Bed Mobility Overal bed mobility: Needs Assistance Bed Mobility: Rolling;Sidelying to Sit Rolling: Min assist Sidelying to sit: Min assist;HOB elevated       General bed mobility comments: Pt able to assist with bed mobility. Min A plus heavy rail use to get sidelying to sitting.   Transfers Overall transfer level: Needs assistance Equipment used: Rolling walker (2 wheeled) Transfers: Sit to/from [facility] Sit to Stand: Min assist Stand pivot transfers: Min guard       General transfer comment: min A to boost into standing and cuing for hand placement on bed. Pt presenting with high guard but able to perform with increased  time and no increased pain.   Ambulation/Gait             General Gait Details: steps to chair; Min guard for cuing to stay within boundaries of RW.    Stairs            Wheelchair Mobility    Modified Rankin (Stroke Patients Only)       Balance Overall balance assessment: Needs assistance Sitting-balance support: Bilateral upper extremity supported;Feet unsupported Sitting balance-Leahy Scale: Good Sitting balance - Comments: cues for upright posture   Standing balance support: Bilateral upper extremity supported Standing balance-Leahy Scale: Fair Standing balance comment: able to stand but requires RW for support                    Cognition Arousal/Alertness: Awake/alert Behavior During Therapy: WFL for tasks assessed/performed Overall Cognitive Status: Within Functional Limits for tasks assessed                      Exercises General Exercises - Lower Extremity Quad Sets: Both;10 reps;Supine Gluteal Sets: Both;10 reps;Supine Short Arc Quad: Both;10 reps;Supine Long Arc Quad: Both;10 reps;Seated Heel Slides: Both;10 reps;Supine Hip ABduction/ADduction: Both;10 reps;Supine    General Comments        Pertinent Vitals/Pain Pain Assessment: Faces Faces Pain Scale: Hurts a little bit Pain Location: across low back  Pain Descriptors / Indicators: Grimacing Pain Intervention(s): Monitored during session;Repositioned    Home Living                      Prior Function  PT Goals (current goals can now be found in the care plan section) Acute Rehab PT Goals Patient Stated Goal: none stated PT Goal Formulation: With patient/family Potential to Achieve Goals: Good Progress towards PT goals: Progressing toward goals    Frequency    Min 5X/week      PT Plan Current plan remains appropriate    Co-evaluation             End of Session Equipment Utilized During Treatment: Gait belt;Back brace Activity  Tolerance: Patient tolerated treatment well Patient left: with call bell/phone within reach;with family/visitor present;in chair;with chair alarm set     Time: [id number] PT Time Calculation (min) (ACUTE ONLY): 28 min  Charges:  $Therapeutic Exercise: 8-22 mins $Therapeutic Activity: 8-22 mins                    G Codes:      [facility] 10-29-2016, 5:39 PM [place], [place] Pager [phone]

## 2016-10-15 NOTE — Progress Notes (Signed)
    Patient doing well from standpoint of lumbar surgery, reports resolved pre-op leg pain, numbness also resolved, denies nay additional syncopal episodes or dizziness, denies any prominent weakness, has been progressing well with therapy and is eager to progress home, likely with [facility].    Physical Exam: BP 115/60 (BP Location: Right Arm)   Pulse 80   Temp 98.1 F (36.7 C) (Oral)   Resp 18   Ht 5\' 6"  (1.676 m)   Wt 77 kg (169 lb 12.1 oz)   SpO2 97%   BMI 27.40 kg/m   Dressing in place, pt sitting up comfortably in chair, TLSO brace on, under-arm straps had to be adjusted to upper T bar otherwise fitting well. NVI  POD #2 s/p L4/5 decompression and fusion doing well with resolved pre-op leg pain and well controlled PO LBP. Currently being worked up by medicine and currently neurology pending completion of workup  - OK to D/C home from orthopaedic consult pending clearance by PT today - up with PT/OT, encourage ambulation  - Likely D/C with HH - Percocet for pain, Valium for muscle spasms - likely d/c home today pending medicine/neurology

## 2016-10-15 NOTE — Progress Notes (Addendum)
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 Patient was back to the room and went to the bathroom. After using the commode was standing up when patient felt dizzy and lost consciousness. The loss of consciousness was brief. Following which patient had weakness of both lower extremities. Denies any incontinence of urine or bowel  Assessment and plan #1. Syncope -suspect orthostatic syncope yesterday when the blood pressure was low, patient is status post fluid resuscitation and therefore orthostatics are negative this morning. Patient felt dizzy and has had low normal blood pressure  , which could be contributing to patient's syncope, at this time will hold off antihypertensives and hydrate.Minimise narcotics and BNZ , continue to hold lisinopril at discharge, would resume metoprolol at 12.5 mg twice a day. Negative cardiac markers, d-dimer  elevated ,  CT angiogram of the chest negative for PE or PNA on CT angio chest  ,   cortisol  6.8  and lactate levels 1.6 . Chest x-ray negative and UA  . Patient has history of polymyalgia rheumatica per the chart. Patient has not had any steroid for last 1 year, but may have adrenal crisis in the setting of recent surgery. Willl start him on stress dose solucortef, echo to r/o any cardiac issues .Will r/o any possible source of underlying infection as well. Have ordered BLOOD CULTURE .   CT angiogram of the neck shows Severe proximal right PCA stenosis, otherwise no significant carotid artery stenosis.   #2. Bilateral lower  extremity weakness - no evidence of acute CVA.   Dr. Lynann Bologna, orthopedic surgeon who feels this change could not be related to the surgery.   Neurology also consulted during this hospitalization   #3. Chronic atrial fibrillation and history of high degree AV block status post pacemaker placement - Coumadin on hold secondary to recent surgery. Patient metoprolol is on hold secondary to low normal blood pressure. Closely monitor in telemetry. Restart metoprolol at a lower dose. Resume anticoagulation when able from a surgical standpoint, bleeding risk standpoint  #4. History of CAD status post stenting - cardiologist notes reviewed. Patient has had normal cardiac cath in 2016. Cardiac markers negative.  #5. Hypertension -management of blood pressure medications as above   #6 Dilated ascending aorta. Recommend annual imaging followup by CTA,or MRA  #7-low-grade fever-UA negative, CT chest without evidence of pneumonia, suspect atelectasis, continue incentive spirometry   DVT prophylaxsis PER  neurosurgery  Code Status:  Full code    Family Communication: Discussed in detail with the patient, all imaging results, lab results explained to the patient   Disposition Plan:  Hold DC given BP issues , as workup still in progress      Consultants:  Neurology  Procedures:  None  Antibiotics: Anti-infectives    Start     Dose/Rate Route Frequency Ordered Stop   10/13/16 1830  ceFAZolin (ANCEF) IVPB 2g/100 mL premix     2 g 200 mL/hr over 30 Minutes Intravenous Every 8 hours 10/13/16 1825 10/14/16 0404   10/13/16 0914  ceFAZolin (ANCEF) IVPB 2g/100 mL  premix     2 g 200 mL/hr over 30 Minutes Intravenous On call to O.R. 10/13/16 0914 10/13/16 1237         HPI/Subjective: Low-grade fever but no focal symptoms of cough, shortness of breath  Objective: Vitals:   10/15/16 0205 10/15/16 0609 10/15/16 0659 10/15/16 0855  BP: 111/65 (!) 106/48 115/60 (!) 111/42  Pulse: 85 80  80   Resp: 17 18  19   Temp: 98.6 F (37 C) 99.1 F (37.3 C) 98.1 F (36.7 C) (!) 100.4 F (38 C)  TempSrc: Oral Oral Oral Oral  SpO2: 97% 97%  100%  Weight:      Height:        Intake/Output Summary (Last 24 hours) at 10/15/16 0950 Last data filed at 10/15/16 0610  Gross per 24 hour  Intake              206 ml  Output              900 ml  Net             -694 ml    Exam:  Examination:  General exam: Appears calm and comfortable  Respiratory system: Clear to auscultation. Respiratory effort normal. Cardiovascular system: S1 & S2 heard, RRR. No JVD, murmurs, rubs, gallops or clicks. No pedal edema. Gastrointestinal system: Abdomen is nondistended, soft and nontender. No organomegaly or masses felt. Normal bowel sounds heard. Central nervous system: Alert and oriented. No focal neurological deficits. Extremities: Symmetric 5 x 5 power. Skin: No rashes, lesions or ulcers Psychiatry: Judgement and insight appear normal. Mood & affect appropriate.     Data Reviewed: I have personally reviewed following labs and imaging studies  Micro Results Recent Results (from the past 240 hour(s))  Surgical pcr screen     Status: None   Collection Time: 10/08/16  3:17 PM  Result Value Ref Range Status   MRSA, PCR NEGATIVE NEGATIVE Final   Staphylococcus aureus NEGATIVE NEGATIVE Final    Comment:        The Xpert SA Assay (FDA approved for NASAL specimens in patients over 16 years of age), is one component of a comprehensive surveillance program.  Test performance has been validated by [facility] for patients greater than or equal to 86 year old. It is not intended to diagnose infection nor to guide or monitor treatment.     Radiology Reports Ct Angio Head W Or Wo Contrast  Result Date: 10/14/2016 CLINICAL DATA:  Lower extremity weakness and syncopal episode. EXAM: CT ANGIOGRAPHY HEAD AND NECK TECHNIQUE: Multidetector CT imaging of the head and neck was performed using the  standard protocol during bolus administration of intravenous contrast. Multiplanar CT image reconstructions and MIPs were obtained to evaluate the vascular anatomy. Carotid stenosis measurements (when applicable) are obtained utilizing NASCET criteria, using the distal internal carotid diameter as the denominator. CONTRAST:  50 mL Isovue 370 COMPARISON:  Noncontrast head CT 10/13/2016 FINDINGS: CTA NECK FINDINGS Aortic arch: Normal variant aortic arch branching with common origin of the brachiocephalic and left common carotid arteries. There is aneurysmal dilatation of the aortic arch, incompletely imaged and more fully evaluated on concurrent chest CTA. Moderate predominantly calcified plaque in the aortic arch. Mild narrowing of the right subclavian artery near its origin. Right carotid system: Mild scattered calcified and soft plaque in the common carotid artery without stenosis. Moderate calcified plaque at the carotid bifurcation and in the proximal ICA without significant stenosis. Tortuous and ectatic distal cervical  ICA. Left carotid system: Common carotid artery demonstrates mild atherosclerotic luminal irregularity without stenosis. There is moderate predominantly calcified plaque at the carotid bifurcation and in the proximal ICA without significant stenosis. Tortuous and ectatic distal cervical ICA. Vertebral arteries: Patent with the right being moderately to strongly dominant. Calcified plaque at the right vertebral artery origin results in moderate stenosis. Skeleton: Advanced disc degeneration at C3-4 and C4-5. Advanced left facet arthrosis at C2-3. Other neck: No mass or lymph node enlargement. Upper chest: More fully evaluated on concurrent chest CTA. Review of the MIP images confirms the above findings CTA HEAD FINDINGS Anterior circulation: The internal carotid arteries are patent from the skullbase to carotid termini with moderately advanced siphon calcified atherosclerosis bilaterally resulting  in mild ophthalmic segment/ proximal supraclinoid stenosis bilaterally. ACAs and MCAs are patent without evidence of major branch occlusion. There is mild left M1 stenosis, and there is moderate branch vessel atherosclerotic irregularity and narrowing throughout the anterior circulation. No aneurysm. Posterior circulation: Intracranial vertebral arteries are patent to the basilar with the right being dominant. PICA and SCA origins are patent. Basilar artery is patent with mild luminal irregularity but no stenosis. There is a fetal origin of the left PCA. There is a severe right PCA stenosis near the P1-P2 junction, with diffuse irregularity and up to moderate stenosis involving the right P2 segment. There is also a moderate to severe distal left P2 stenosis, and there is moderate disease affecting the P3 segments and more distal branch vessels bilaterally. No aneurysm. Venous sinuses: Patent. Anatomic variants: None. Delayed phase: No abnormal enhancement. Review of the MIP images confirms the above findings IMPRESSION: 1. No large vessel occlusion. 2. Moderately advanced intracranial atherosclerosis. Mild bilateral ICA stenosis. 3. Severe proximal right PCA stenosis. 4. Moderate bilateral cervical carotid artery atherosclerosis without stenosis. 5. Moderate proximal right vertebral artery stenosis. 6. Thoracic aortic aneurysm.  See separate chest CTA. Electronically Signed   By: Logan Bores M.D.   On: 10/14/2016 12:07   Dg Chest 2 View  Result Date: 10/08/2016 CLINICAL DATA:  Lumbar fusion . EXAM: CHEST  2 VIEW COMPARISON:  12/30/2015. FINDINGS: Cardiac pacer with lead tips in right atrium right ventricle. Stable cardiomegaly. No focal infiltrate. Biapical pleural-parenchymal thickening noted consistent with scarring. Chest is stable from prior exam . IMPRESSION: No acute cardiopulmonary disease. Cardiac pacer stable position. Stable cardiomegaly. Electronically Signed   By: Marcello Moores  Register   On: 10/08/2016  17:03   Dg Lumbar Spine 2-3 Views  Result Date: 10/13/2016 CLINICAL DATA:  L3-4 TLIF EXAM: DG C-ARM 61-120 MIN; LUMBAR SPINE - 2-3 VIEW COMPARISON:  None. FINDINGS: 45 seconds of fluoroscopic time utilized for talar half of the elbow 3 4 interspace. Hardware noted within the pedicles of L3 and L4 without breech of vertebral endplates. Interbody disc noted at L3-4. IMPRESSION: L3-4 transforaminal lumbar interbody fusion. Electronically Signed   By: Ashley Royalty M.D.   On: 10/13/2016 17:58   Ct Head Wo Contrast  Result Date: 10/13/2016 CLINICAL DATA:  Bilateral lower extremity weakness EXAM: CT HEAD WITHOUT CONTRAST TECHNIQUE: Contiguous axial images were obtained from the base of the skull through the vertex without intravenous contrast. COMPARISON:  Head CT 02/21/2014 FINDINGS: Brain: No mass lesion, intraparenchymal hemorrhage or extra-axial collection. No evidence of acute cortical infarct. There is confluent periventricular hypoattenuation compatible with chronic microvascular disease. Vascular: No hyperdense vessel or unexpected calcification. Skull: Normal visualized skull base, calvarium and extracranial soft tissues. Sinuses/Orbits: No sinus fluid levels or advanced mucosal  thickening. No mastoid effusion. Normal orbits. IMPRESSION: 1. No acute intracranial abnormality. 2. Advanced chronic microvascular ischemia. Electronically Signed   By: Ulyses Jarred M.D.   On: 10/13/2016 22:02   Ct Angio Neck W Or Wo Contrast  Result Date: 10/14/2016 CLINICAL DATA:  Lower extremity weakness and syncopal episode. EXAM: CT ANGIOGRAPHY HEAD AND NECK TECHNIQUE: Multidetector CT imaging of the head and neck was performed using the standard protocol during bolus administration of intravenous contrast. Multiplanar CT image reconstructions and MIPs were obtained to evaluate the vascular anatomy. Carotid stenosis measurements (when applicable) are obtained utilizing NASCET criteria, using the distal internal carotid  diameter as the denominator. CONTRAST:  50 mL Isovue 370 COMPARISON:  Noncontrast head CT 10/13/2016 FINDINGS: CTA NECK FINDINGS Aortic arch: Normal variant aortic arch branching with common origin of the brachiocephalic and left common carotid arteries. There is aneurysmal dilatation of the aortic arch, incompletely imaged and more fully evaluated on concurrent chest CTA. Moderate predominantly calcified plaque in the aortic arch. Mild narrowing of the right subclavian artery near its origin. Right carotid system: Mild scattered calcified and soft plaque in the common carotid artery without stenosis. Moderate calcified plaque at the carotid bifurcation and in the proximal ICA without significant stenosis. Tortuous and ectatic distal cervical ICA. Left carotid system: Common carotid artery demonstrates mild atherosclerotic luminal irregularity without stenosis. There is moderate predominantly calcified plaque at the carotid bifurcation and in the proximal ICA without significant stenosis. Tortuous and ectatic distal cervical ICA. Vertebral arteries: Patent with the right being moderately to strongly dominant. Calcified plaque at the right vertebral artery origin results in moderate stenosis. Skeleton: Advanced disc degeneration at C3-4 and C4-5. Advanced left facet arthrosis at C2-3. Other neck: No mass or lymph node enlargement. Upper chest: More fully evaluated on concurrent chest CTA. Review of the MIP images confirms the above findings CTA HEAD FINDINGS Anterior circulation: The internal carotid arteries are patent from the skullbase to carotid termini with moderately advanced siphon calcified atherosclerosis bilaterally resulting in mild ophthalmic segment/ proximal supraclinoid stenosis bilaterally. ACAs and MCAs are patent without evidence of major branch occlusion. There is mild left M1 stenosis, and there is moderate branch vessel atherosclerotic irregularity and narrowing throughout the anterior  circulation. No aneurysm. Posterior circulation: Intracranial vertebral arteries are patent to the basilar with the right being dominant. PICA and SCA origins are patent. Basilar artery is patent with mild luminal irregularity but no stenosis. There is a fetal origin of the left PCA. There is a severe right PCA stenosis near the P1-P2 junction, with diffuse irregularity and up to moderate stenosis involving the right P2 segment. There is also a moderate to severe distal left P2 stenosis, and there is moderate disease affecting the P3 segments and more distal branch vessels bilaterally. No aneurysm. Venous sinuses: Patent. Anatomic variants: None. Delayed phase: No abnormal enhancement. Review of the MIP images confirms the above findings IMPRESSION: 1. No large vessel occlusion. 2. Moderately advanced intracranial atherosclerosis. Mild bilateral ICA stenosis. 3. Severe proximal right PCA stenosis. 4. Moderate bilateral cervical carotid artery atherosclerosis without stenosis. 5. Moderate proximal right vertebral artery stenosis. 6. Thoracic aortic aneurysm.  See separate chest CTA. Electronically Signed   By: Logan Bores M.D.   On: 10/14/2016 12:07   Ct Angio Chest Pe W Or Wo Contrast  Result Date: 10/14/2016 CLINICAL DATA:  Syncopal episode last night. Lower extremity weakness. Strength has improved overnight. 10/13/2016 EXAM: CT ANGIOGRAPHY CHEST WITH CONTRAST TECHNIQUE: Multidetector CT imaging of  the chest was performed using the standard protocol during bolus administration of intravenous contrast. Multiplanar CT image reconstructions and MIPs were obtained to evaluate the vascular anatomy. CONTRAST:  80 cc Isovue 370 COMPARISON:  Chest x-ray 10/13/2016 FINDINGS: Cardiovascular: Coronary stents and coronary artery calcifications are present. The pulmonary arteries are well opacified. There is no acute pulmonary embolus. Left-sided transvenous pacemaker leads to the right atrium and right ventricle. There is  atherosclerotic calcification of the mitral annulus. There is atherosclerotic calcification of the thoracic aorta. Ascending aorta is 4.4 cm. Mediastinum/Nodes: Precarinal lymph node is 1.4 cm. Right paratracheal lymph node is 1.3 cm. Right hilar node is 1.3 cm. Left hilar node is 1.6 cm. Small hiatal hernia. Lungs/Pleura: There are focal areas of air trapping. Minimal bibasilar atelectasis. No suspicious pulmonary nodules. No focal consolidations. Trace pleural effusions. Upper Abdomen: Hiatal hernia.  Otherwise unremarkable. Musculoskeletal: Thoracic osteophytes are present. No acute osseous abnormality. Review of the MIP images confirms the above findings. IMPRESSION: 1.  No evidence for acute  abnormality. 2. Coronary artery disease. 3. No evidence for acute pulmonary embolus. 4. Small mediastinal and hilar lymph nodes may be reactive given the history of CHF. 5. Mild mosaic appearance of the lungs consistent with air trapping. Trace pleural effusions. 6. Dilated ascending aorta. Recommend annual imaging followup by CTA or MRA. This recommendation follows 2010 ACCF/AHA/AATS/ACR/ASA/SCA/SCAI/SIR/STS/SVM Guidelines for the Diagnosis and Management of Patients with Thoracic Aortic Disease. Circulation. 2010; [id number] New Electronically Signed   By: Nolon Nations M.D.   On: 10/14/2016 11:56   Ct Lumbar Spine Wo Contrast  Result Date: 10/14/2016 CLINICAL DATA:  Syncopal episode and lower extremity weakness. Lumbar fusion knee yesterday. EXAM: CT LUMBAR SPINE WITHOUT CONTRAST TECHNIQUE: Multidetector CT imaging of the lumbar spine was performed without intravenous contrast administration. Multiplanar CT image reconstructions were also generated. COMPARISON:  Lumbar CT myelogram 09/28/2016 FINDINGS: Segmentation: 5 lumbar type vertebrae. Alignment: Slight thoracolumbar dextroscoliosis, unchanged. No listhesis. Vertebrae: Sequelae of interval L3-4 decompression and fusion are identified. Pedicle screws and  interconnecting rods are present bilaterally. The left L4 screw terminates just lateral to the vertebral body. An interbody graft is in place. No acute fracture is identified. Vertebral body heights are preserved. Paraspinal and other soft tissues: Postoperative changes in the posterior soft tissues related to recent surgery with gas and unorganized fluid which also extends into the left epidural space and neural foramen at L3-4. Disc levels: Interval postoperative changes at L3-4 with left greater than right laminectomies and partial facetectomies. Limited assessment of the spinal canal due to postoperative changes and metallic streak artifact. There is mild-to-moderate right neural foraminal stenosis at L3-4 due to disc bulging and spurring, unchanged. Mild disc bulging elsewhere throughout the lumbar spine is similar to the recent myelogram. IMPRESSION: 1. Interval postoperative changes from recent L3-4 decompression and fusion as above. 2. No evidence of acute osseous abnormality. Electronically Signed   By: Logan Bores M.D.   On: 10/14/2016 12:30   Ct Lumbar Spine W Contrast  Result Date: 09/28/2016 CLINICAL DATA:  Lumbar radiculopathy. Low back and left lower extremity pain. EXAM: LUMBAR MYELOGRAM FLUOROSCOPY TIME:  Radiation Exposure Index (as provided by the fluoroscopic device): 267.44 microGray*m^2 Fluoroscopy Time (in minutes and seconds):  27 seconds PROCEDURE: After thorough discussion of risks and benefits of the procedure including bleeding, infection, injury to nerves, blood vessels, adjacent structures as well as headache and CSF leak, written and oral informed consent was obtained. Consent was obtained by Dr. Logan Bores.  Time out form was completed. Patient was positioned prone on the fluoroscopy table. Local anesthesia was provided with 1% lidocaine without epinephrine after prepped and draped in the usual sterile fashion. Puncture was performed at L4-5 using a 3 1/2 inch 22-gauge spinal  needle via a right interlaminar approach. Using a single pass through the dura, the needle was placed within the thecal sac, with return of clear CSF. 15 mL of Isovue M 200 was injected into the thecal sac, with normal opacification of the nerve roots and cauda equina consistent with free flow within the subarachnoid space. I personally performed the lumbar puncture and administered the intrathecal contrast. I also personally supervised acquisition of the myelogram images. TECHNIQUE: Contiguous axial images were obtained through the Lumbar spine after the intrathecal infusion of infusion. Coronal and sagittal reconstructions were obtained of the axial image sets. COMPARISON:  Lumbar spine radiographs 08/27/2016 FINDINGS: LUMBAR MYELOGRAM FINDINGS: Lumbar segmentation is normal. There is very mild thoracolumbar dextroscoliosis without listhesis or evidence of dynamic instability on upright flexion and extension images. Disc space narrowing and degenerative endplate sclerosis and osteophytosis are present at L3-4. There is mild-to-moderate waist like narrowing of the thecal sac at this level with asymmetric left lateral recess stenosis and left L4 nerve root encroachment. There is also evidence of left L3 nerve root cut off near the entrance to the neural foramen. Spinal stenosis at this level does not appear significantly changed with upright positioning. A shallow ventral extradural defect is present at L4-5. CT LUMBAR MYELOGRAM FINDINGS: There is very mild thoracolumbar dextroscoliosis. There is no listhesis. Vertebral body heights are preserved without evidence of fracture or destructive osseous lesion. The conus medullaris terminates at T12-L1. The diffuse atherosclerotic calcification is noted of the abdominal aorta and its major branch vessels. There is mild ectasia of the infrarenal abdominal aorta. T12-L1:  Negative. L1-2:  Mild disc bulging without stenosis. L2-3:  Mild disc bulging without stenosis. L3-4:  Mild-to-moderate disc space narrowing. Circumferential disc bulging greater to the left and mild-to-moderate facet and ligamentum flavum hypertrophy result in mild-to-moderate spinal stenosis, mild right and mild-to-moderate left lateral recess stenosis. Additionally, there is a left foraminal disc extrusion which results in severe medial foraminal stenosis and left L3 nerve root impingement. L4-5: Mild circumferential disc bulging and mild facet and ligamentum flavum hypertrophy result in mild right neural foraminal stenosis and mild ventral thecal sac flattening without significant spinal stenosis. L5-S1:  Mild disc bulging greater to the right without stenosis. IMPRESSION: 1. L3-4 disc extrusion resulting in severe left neural foraminal stenosis and left L3 nerve root impingement. 2. Mild-to-moderate multifactorial spinal and left greater than right lateral recess stenosis at L3-4. 3. Mild disc and facet degeneration elsewhere without stenosis. Electronically Signed   By: Logan Bores M.D.   On: 09/28/2016 14:08   Dg Lumbar Spine 1 View  Result Date: 10/13/2016 CLINICAL DATA:  L3-4 interbody fusion EXAM: LUMBAR SPINE - 1 VIEW COMPARISON:  Myelogram 09/28/2016 FINDINGS: Initial lateral film shows the lower needle at the spinous process of L3 an the upper needle at the interspinous space of L1-2. IMPRESSION: Spinous process at L3 and interspinous space L1-2 localized. Electronically Signed   By: Nelson Chimes M.D.   On: 10/13/2016 15:10   Dg Chest Port 1 View  Result Date: 10/13/2016 CLINICAL DATA:  Weakness EXAM: PORTABLE CHEST 1 VIEW COMPARISON:  10/08/2016 FINDINGS: Left-sided pacing device, similar compared to prior. No acute infiltrate or effusion. Stable cardiomediastinal silhouette with atherosclerosis. No pneumothorax. IMPRESSION:  No radiographic evidence for acute cardiopulmonary abnormality Electronically Signed   By: Donavan Foil M.D.   On: 10/13/2016 20:42   Dg C-arm 1-60 Min  Result Date:  10/13/2016 CLINICAL DATA:  L3-4 TLIF EXAM: DG C-ARM 61-120 MIN; LUMBAR SPINE - 2-3 VIEW COMPARISON:  None. FINDINGS: 45 seconds of fluoroscopic time utilized for talar half of the elbow 3 4 interspace. Hardware noted within the pedicles of L3 and L4 without breech of vertebral endplates. Interbody disc noted at L3-4. IMPRESSION: L3-4 transforaminal lumbar interbody fusion. Electronically Signed   By: Ashley Royalty M.D.   On: 10/13/2016 17:58   Dg Myelography Lumbar Inj Lumbosacral  Result Date: 09/28/2016 CLINICAL DATA:  Lumbar radiculopathy. Low back and left lower extremity pain. EXAM: LUMBAR MYELOGRAM FLUOROSCOPY TIME:  Radiation Exposure Index (as provided by the fluoroscopic device): 267.44 microGray*m^2 Fluoroscopy Time (in minutes and seconds):  27 seconds PROCEDURE: After thorough discussion of risks and benefits of the procedure including bleeding, infection, injury to nerves, blood vessels, adjacent structures as well as headache and CSF leak, written and oral informed consent was obtained. Consent was obtained by Dr. Logan Bores. Time out form was completed. Patient was positioned prone on the fluoroscopy table. Local anesthesia was provided with 1% lidocaine without epinephrine after prepped and draped in the usual sterile fashion. Puncture was performed at L4-5 using a 3 1/2 inch 22-gauge spinal needle via a right interlaminar approach. Using a single pass through the dura, the needle was placed within the thecal sac, with return of clear CSF. 15 mL of Isovue M 200 was injected into the thecal sac, with normal opacification of the nerve roots and cauda equina consistent with free flow within the subarachnoid space. I personally performed the lumbar puncture and administered the intrathecal contrast. I also personally supervised acquisition of the myelogram images. TECHNIQUE: Contiguous axial images were obtained through the Lumbar spine after the intrathecal infusion of infusion. Coronal and sagittal  reconstructions were obtained of the axial image sets. COMPARISON:  Lumbar spine radiographs 08/27/2016 FINDINGS: LUMBAR MYELOGRAM FINDINGS: Lumbar segmentation is normal. There is very mild thoracolumbar dextroscoliosis without listhesis or evidence of dynamic instability on upright flexion and extension images. Disc space narrowing and degenerative endplate sclerosis and osteophytosis are present at L3-4. There is mild-to-moderate waist like narrowing of the thecal sac at this level with asymmetric left lateral recess stenosis and left L4 nerve root encroachment. There is also evidence of left L3 nerve root cut off near the entrance to the neural foramen. Spinal stenosis at this level does not appear significantly changed with upright positioning. A shallow ventral extradural defect is present at L4-5. CT LUMBAR MYELOGRAM FINDINGS: There is very mild thoracolumbar dextroscoliosis. There is no listhesis. Vertebral body heights are preserved without evidence of fracture or destructive osseous lesion. The conus medullaris terminates at T12-L1. The diffuse atherosclerotic calcification is noted of the abdominal aorta and its major branch vessels. There is mild ectasia of the infrarenal abdominal aorta. T12-L1:  Negative. L1-2:  Mild disc bulging without stenosis. L2-3:  Mild disc bulging without stenosis. L3-4: Mild-to-moderate disc space narrowing. Circumferential disc bulging greater to the left and mild-to-moderate facet and ligamentum flavum hypertrophy result in mild-to-moderate spinal stenosis, mild right and mild-to-moderate left lateral recess stenosis. Additionally, there is a left foraminal disc extrusion which results in severe medial foraminal stenosis and left L3 nerve root impingement. L4-5: Mild circumferential disc bulging and mild facet and ligamentum flavum hypertrophy result in mild right neural foraminal stenosis  and mild ventral thecal sac flattening without significant spinal stenosis. L5-S1:   Mild disc bulging greater to the right without stenosis. IMPRESSION: 1. L3-4 disc extrusion resulting in severe left neural foraminal stenosis and left L3 nerve root impingement. 2. Mild-to-moderate multifactorial spinal and left greater than right lateral recess stenosis at L3-4. 3. Mild disc and facet degeneration elsewhere without stenosis. Electronically Signed   By: Logan Bores M.D.   On: 09/28/2016 14:08     CBC  Recent Labs Lab 10/08/16 1519 10/13/16 2049 10/15/16 0221  WBC 8.5 11.0* 10.1  HGB 13.7 11.7* 9.7*  HCT 40.5 34.4* 28.6*  PLT 139* 107* 96*  MCV 100.7* 100.6* 101.4*  MCH 34.1* 34.2* 34.4*  MCHC 33.8 34.0 33.9  RDW 12.6 12.9 12.9  LYMPHSABS 1.7 0.5*  --   MONOABS 0.7 0.3  --   EOSABS 0.2 0.0  --   BASOSABS 0.0 0.0  --     Chemistries   Recent Labs Lab 10/08/16 1519 10/13/16 2049 10/15/16 0221  NA 142 137 139  K 4.2 4.2 4.3  CL 108 102 105  CO2 27 28 26   GLUCOSE 93 148* 92  BUN 21* 14 15  CREATININE 0.97 0.94 1.06  CALCIUM 9.7 9.0 8.8*  AST 26 28 34  ALT 18 17 13*  ALKPHOS 62 57 45  BILITOT 0.5 0.8 0.7   ------------------------------------------------------------------------------------------------------------------ estimated creatinine clearance is 57.9 mL/min (by C-G formula based on SCr of 1.06 mg/dL). ------------------------------------------------------------------------------------------------------------------ No results for input(s): HGBA1C in the last 72 hours. ------------------------------------------------------------------------------------------------------------------ No results for input(s): CHOL, HDL, LDLCALC, TRIG, CHOLHDL, LDLDIRECT in the last 72 hours. ------------------------------------------------------------------------------------------------------------------ No results for input(s): TSH, T4TOTAL, T3FREE, THYROIDAB in the last 72 hours.  Invalid input(s):  FREET3 ------------------------------------------------------------------------------------------------------------------ No results for input(s): VITAMINB12, FOLATE, FERRITIN, TIBC, IRON, RETICCTPCT in the last 72 hours.  Coagulation profile  Recent Labs Lab 10/08/16 1519 10/13/16 0953  INR 2.43 1.23     Recent Labs  10/13/16 2049  DDIMER 1.35*    Cardiac Enzymes  Recent Labs Lab 10/13/16 2049 10/14/16 0228 10/14/16 0741  TROPONINI <0.03 <0.03 <0.03   ------------------------------------------------------------------------------------------------------------------ Invalid input(s): POCBNP   CBG:  Recent Labs Lab 10/14/16 0638 10/14/16 1150 10/14/16 1751 10/14/16 2157 10/15/16 0608  GLUCAP 173* 157* 199* 122* 86       Studies: Ct Angio Head W Or Wo Contrast  Result Date: 10/14/2016 CLINICAL DATA:  Lower extremity weakness and syncopal episode. EXAM: CT ANGIOGRAPHY HEAD AND NECK TECHNIQUE: Multidetector CT imaging of the head and neck was performed using the standard protocol during bolus administration of intravenous contrast. Multiplanar CT image reconstructions and MIPs were obtained to evaluate the vascular anatomy. Carotid stenosis measurements (when applicable) are obtained utilizing NASCET criteria, using the distal internal carotid diameter as the denominator. CONTRAST:  50 mL Isovue 370 COMPARISON:  Noncontrast head CT 10/13/2016 FINDINGS: CTA NECK FINDINGS Aortic arch: Normal variant aortic arch branching with common origin of the brachiocephalic and left common carotid arteries. There is aneurysmal dilatation of the aortic arch, incompletely imaged and more fully evaluated on concurrent chest CTA. Moderate predominantly calcified plaque in the aortic arch. Mild narrowing of the right subclavian artery near its origin. Right carotid system: Mild scattered calcified and soft plaque in the common carotid artery without stenosis. Moderate calcified plaque at  the carotid bifurcation and in the proximal ICA without significant stenosis. Tortuous and ectatic distal cervical ICA. Left carotid system: Common carotid artery demonstrates mild atherosclerotic luminal irregularity without stenosis. There  is moderate predominantly calcified plaque at the carotid bifurcation and in the proximal ICA without significant stenosis. Tortuous and ectatic distal cervical ICA. Vertebral arteries: Patent with the right being moderately to strongly dominant. Calcified plaque at the right vertebral artery origin results in moderate stenosis. Skeleton: Advanced disc degeneration at C3-4 and C4-5. Advanced left facet arthrosis at C2-3. Other neck: No mass or lymph node enlargement. Upper chest: More fully evaluated on concurrent chest CTA. Review of the MIP images confirms the above findings CTA HEAD FINDINGS Anterior circulation: The internal carotid arteries are patent from the skullbase to carotid termini with moderately advanced siphon calcified atherosclerosis bilaterally resulting in mild ophthalmic segment/ proximal supraclinoid stenosis bilaterally. ACAs and MCAs are patent without evidence of major branch occlusion. There is mild left M1 stenosis, and there is moderate branch vessel atherosclerotic irregularity and narrowing throughout the anterior circulation. No aneurysm. Posterior circulation: Intracranial vertebral arteries are patent to the basilar with the right being dominant. PICA and SCA origins are patent. Basilar artery is patent with mild luminal irregularity but no stenosis. There is a fetal origin of the left PCA. There is a severe right PCA stenosis near the P1-P2 junction, with diffuse irregularity and up to moderate stenosis involving the right P2 segment. There is also a moderate to severe distal left P2 stenosis, and there is moderate disease affecting the P3 segments and more distal branch vessels bilaterally. No aneurysm. Venous sinuses: Patent. Anatomic variants:  None. Delayed phase: No abnormal enhancement. Review of the MIP images confirms the above findings IMPRESSION: 1. No large vessel occlusion. 2. Moderately advanced intracranial atherosclerosis. Mild bilateral ICA stenosis. 3. Severe proximal right PCA stenosis. 4. Moderate bilateral cervical carotid artery atherosclerosis without stenosis. 5. Moderate proximal right vertebral artery stenosis. 6. Thoracic aortic aneurysm.  See separate chest CTA. Electronically Signed   By: Logan Bores M.D.   On: 10/14/2016 12:07   Dg Lumbar Spine 2-3 Views  Result Date: 10/13/2016 CLINICAL DATA:  L3-4 TLIF EXAM: DG C-ARM 61-120 MIN; LUMBAR SPINE - 2-3 VIEW COMPARISON:  None. FINDINGS: 45 seconds of fluoroscopic time utilized for talar half of the elbow 3 4 interspace. Hardware noted within the pedicles of L3 and L4 without breech of vertebral endplates. Interbody disc noted at L3-4. IMPRESSION: L3-4 transforaminal lumbar interbody fusion. Electronically Signed   By: Ashley Royalty M.D.   On: 10/13/2016 17:58   Ct Head Wo Contrast  Result Date: 10/13/2016 CLINICAL DATA:  Bilateral lower extremity weakness EXAM: CT HEAD WITHOUT CONTRAST TECHNIQUE: Contiguous axial images were obtained from the base of the skull through the vertex without intravenous contrast. COMPARISON:  Head CT 02/21/2014 FINDINGS: Brain: No mass lesion, intraparenchymal hemorrhage or extra-axial collection. No evidence of acute cortical infarct. There is confluent periventricular hypoattenuation compatible with chronic microvascular disease. Vascular: No hyperdense vessel or unexpected calcification. Skull: Normal visualized skull base, calvarium and extracranial soft tissues. Sinuses/Orbits: No sinus fluid levels or advanced mucosal thickening. No mastoid effusion. Normal orbits. IMPRESSION: 1. No acute intracranial abnormality. 2. Advanced chronic microvascular ischemia. Electronically Signed   By: Ulyses Jarred M.D.   On: 10/13/2016 22:02   Ct Angio Neck W  Or Wo Contrast  Result Date: 10/14/2016 CLINICAL DATA:  Lower extremity weakness and syncopal episode. EXAM: CT ANGIOGRAPHY HEAD AND NECK TECHNIQUE: Multidetector CT imaging of the head and neck was performed using the standard protocol during bolus administration of intravenous contrast. Multiplanar CT image reconstructions and MIPs were obtained to evaluate the vascular anatomy.  Carotid stenosis measurements (when applicable) are obtained utilizing NASCET criteria, using the distal internal carotid diameter as the denominator. CONTRAST:  50 mL Isovue 370 COMPARISON:  Noncontrast head CT 10/13/2016 FINDINGS: CTA NECK FINDINGS Aortic arch: Normal variant aortic arch branching with common origin of the brachiocephalic and left common carotid arteries. There is aneurysmal dilatation of the aortic arch, incompletely imaged and more fully evaluated on concurrent chest CTA. Moderate predominantly calcified plaque in the aortic arch. Mild narrowing of the right subclavian artery near its origin. Right carotid system: Mild scattered calcified and soft plaque in the common carotid artery without stenosis. Moderate calcified plaque at the carotid bifurcation and in the proximal ICA without significant stenosis. Tortuous and ectatic distal cervical ICA. Left carotid system: Common carotid artery demonstrates mild atherosclerotic luminal irregularity without stenosis. There is moderate predominantly calcified plaque at the carotid bifurcation and in the proximal ICA without significant stenosis. Tortuous and ectatic distal cervical ICA. Vertebral arteries: Patent with the right being moderately to strongly dominant. Calcified plaque at the right vertebral artery origin results in moderate stenosis. Skeleton: Advanced disc degeneration at C3-4 and C4-5. Advanced left facet arthrosis at C2-3. Other neck: No mass or lymph node enlargement. Upper chest: More fully evaluated on concurrent chest CTA. Review of the MIP images  confirms the above findings CTA HEAD FINDINGS Anterior circulation: The internal carotid arteries are patent from the skullbase to carotid termini with moderately advanced siphon calcified atherosclerosis bilaterally resulting in mild ophthalmic segment/ proximal supraclinoid stenosis bilaterally. ACAs and MCAs are patent without evidence of major branch occlusion. There is mild left M1 stenosis, and there is moderate branch vessel atherosclerotic irregularity and narrowing throughout the anterior circulation. No aneurysm. Posterior circulation: Intracranial vertebral arteries are patent to the basilar with the right being dominant. PICA and SCA origins are patent. Basilar artery is patent with mild luminal irregularity but no stenosis. There is a fetal origin of the left PCA. There is a severe right PCA stenosis near the P1-P2 junction, with diffuse irregularity and up to moderate stenosis involving the right P2 segment. There is also a moderate to severe distal left P2 stenosis, and there is moderate disease affecting the P3 segments and more distal branch vessels bilaterally. No aneurysm. Venous sinuses: Patent. Anatomic variants: None. Delayed phase: No abnormal enhancement. Review of the MIP images confirms the above findings IMPRESSION: 1. No large vessel occlusion. 2. Moderately advanced intracranial atherosclerosis. Mild bilateral ICA stenosis. 3. Severe proximal right PCA stenosis. 4. Moderate bilateral cervical carotid artery atherosclerosis without stenosis. 5. Moderate proximal right vertebral artery stenosis. 6. Thoracic aortic aneurysm.  See separate chest CTA. Electronically Signed   By: Logan Bores M.D.   On: 10/14/2016 12:07   Ct Angio Chest Pe W Or Wo Contrast  Result Date: 10/14/2016 CLINICAL DATA:  Syncopal episode last night. Lower extremity weakness. Strength has improved overnight. 10/13/2016 EXAM: CT ANGIOGRAPHY CHEST WITH CONTRAST TECHNIQUE: Multidetector CT imaging of the chest was  performed using the standard protocol during bolus administration of intravenous contrast. Multiplanar CT image reconstructions and MIPs were obtained to evaluate the vascular anatomy. CONTRAST:  80 cc Isovue 370 COMPARISON:  Chest x-ray 10/13/2016 FINDINGS: Cardiovascular: Coronary stents and coronary artery calcifications are present. The pulmonary arteries are well opacified. There is no acute pulmonary embolus. Left-sided transvenous pacemaker leads to the right atrium and right ventricle. There is atherosclerotic calcification of the mitral annulus. There is atherosclerotic calcification of the thoracic aorta. Ascending aorta is 4.4 cm. Mediastinum/Nodes:  Precarinal lymph node is 1.4 cm. Right paratracheal lymph node is 1.3 cm. Right hilar node is 1.3 cm. Left hilar node is 1.6 cm. Small hiatal hernia. Lungs/Pleura: There are focal areas of air trapping. Minimal bibasilar atelectasis. No suspicious pulmonary nodules. No focal consolidations. Trace pleural effusions. Upper Abdomen: Hiatal hernia.  Otherwise unremarkable. Musculoskeletal: Thoracic osteophytes are present. No acute osseous abnormality. Review of the MIP images confirms the above findings. IMPRESSION: 1.  No evidence for acute  abnormality. 2. Coronary artery disease. 3. No evidence for acute pulmonary embolus. 4. Small mediastinal and hilar lymph nodes may be reactive given the history of CHF. 5. Mild mosaic appearance of the lungs consistent with air trapping. Trace pleural effusions. 6. Dilated ascending aorta. Recommend annual imaging followup by CTA or MRA. This recommendation follows 2010 ACCF/AHA/AATS/ACR/ASA/SCA/SCAI/SIR/STS/SVM Guidelines for the Diagnosis and Management of Patients with Thoracic Aortic Disease. Circulation. 2010; [id number] New Electronically Signed   By: Nolon Nations M.D.   On: 10/14/2016 11:56   Ct Lumbar Spine Wo Contrast  Result Date: 10/14/2016 CLINICAL DATA:  Syncopal episode and lower extremity weakness.  Lumbar fusion knee yesterday. EXAM: CT LUMBAR SPINE WITHOUT CONTRAST TECHNIQUE: Multidetector CT imaging of the lumbar spine was performed without intravenous contrast administration. Multiplanar CT image reconstructions were also generated. COMPARISON:  Lumbar CT myelogram 09/28/2016 FINDINGS: Segmentation: 5 lumbar type vertebrae. Alignment: Slight thoracolumbar dextroscoliosis, unchanged. No listhesis. Vertebrae: Sequelae of interval L3-4 decompression and fusion are identified. Pedicle screws and interconnecting rods are present bilaterally. The left L4 screw terminates just lateral to the vertebral body. An interbody graft is in place. No acute fracture is identified. Vertebral body heights are preserved. Paraspinal and other soft tissues: Postoperative changes in the posterior soft tissues related to recent surgery with gas and unorganized fluid which also extends into the left epidural space and neural foramen at L3-4. Disc levels: Interval postoperative changes at L3-4 with left greater than right laminectomies and partial facetectomies. Limited assessment of the spinal canal due to postoperative changes and metallic streak artifact. There is mild-to-moderate right neural foraminal stenosis at L3-4 due to disc bulging and spurring, unchanged. Mild disc bulging elsewhere throughout the lumbar spine is similar to the recent myelogram. IMPRESSION: 1. Interval postoperative changes from recent L3-4 decompression and fusion as above. 2. No evidence of acute osseous abnormality. Electronically Signed   By: Logan Bores M.D.   On: 10/14/2016 12:30   Dg Lumbar Spine 1 View  Result Date: 10/13/2016 CLINICAL DATA:  L3-4 interbody fusion EXAM: LUMBAR SPINE - 1 VIEW COMPARISON:  Myelogram 09/28/2016 FINDINGS: Initial lateral film shows the lower needle at the spinous process of L3 an the upper needle at the interspinous space of L1-2. IMPRESSION: Spinous process at L3 and interspinous space L1-2 localized.  Electronically Signed   By: Nelson Chimes M.D.   On: 10/13/2016 15:10   Dg Chest Port 1 View  Result Date: 10/13/2016 CLINICAL DATA:  Weakness EXAM: PORTABLE CHEST 1 VIEW COMPARISON:  10/08/2016 FINDINGS: Left-sided pacing device, similar compared to prior. No acute infiltrate or effusion. Stable cardiomediastinal silhouette with atherosclerosis. No pneumothorax. IMPRESSION: No radiographic evidence for acute cardiopulmonary abnormality Electronically Signed   By: Donavan Foil M.D.   On: 10/13/2016 20:42   Dg C-arm 1-60 Min  Result Date: 10/13/2016 CLINICAL DATA:  L3-4 TLIF EXAM: DG C-ARM 61-120 MIN; LUMBAR SPINE - 2-3 VIEW COMPARISON:  None. FINDINGS: 45 seconds of fluoroscopic time utilized for talar half of the elbow 3 4 interspace.  Hardware noted within the pedicles of L3 and L4 without breech of vertebral endplates. Interbody disc noted at L3-4. IMPRESSION: L3-4 transforaminal lumbar interbody fusion. Electronically Signed   By: Ashley Royalty M.D.   On: 10/13/2016 17:58      Lab Results  Component Value Date   HGBA1C 5.9 (H) 10/08/2016   HGBA1C 5.8 08/06/2015   HGBA1C 6.0 08/09/2014   Lab Results  Component Value Date   MICROALBUR 1.3 08/09/2014   LDLCALC 122 (H) 08/06/2015   CREATININE 1.06 10/15/2016       Scheduled Meds: . calcium-vitamin D  1 tablet Oral BID  . docusate sodium  100 mg Oral BID  . donepezil  10 mg Oral QHS  . gabapentin  300 mg Oral TID  . memantine  10 mg Oral BID  . multivitamin with minerals  1 tablet Oral Daily  . sodium chloride flush  3 mL Intravenous Q12H  . vitamin C  500 mg Oral QHS   Continuous Infusions: . sodium chloride    . 0.9 % NaCl with KCl 20 mEq / L Stopped (10/14/16 2340)     LOS: 2 days    Time spent: >30 MINS    [facility] Pager [phone]. If 7PM-7AM, please contact night-coverage at [email or web address], password [facility] 10/15/2016, 9:50 AM  LOS: 2 days

## 2016-10-15 NOTE — Progress Notes (Addendum)
Occupational Therapy Treatment Patient Details Name: Tanner Hensley MRN: [id number] DOB: 12-22-1940 Today's Date: 10/15/2016    History of present illness Tanner Hensley is a 76 y.o. male who presents with ongoing pain in the left leg, severe. Left-sided L3-4 transforaminal lumbar interbody fusion. Right-sided L3-4 posterolateral fusion.   OT comments  Pt progressing slowly towards acute OT goals. Pain a limiting factor, some nausea. Pt with slow and guarded movements. Eyes closed at times ambulating in room (due to pain? Dizziness?). Discussed bed mobility at home. Pt currently mod A with bed mobility. Spouse present and verbalizing that she is unable to physically assist pt and there is no consistent alternative help at home. Discussed possibility of renting a hospital bed. Pt not ready for d/c home today from OT stand point.     Follow Up Recommendations  Home health OT;Supervision/Assistance - 24 hour    Equipment Recommendations  Hospital bed (rent)    Recommendations for Other Services      Precautions / Restrictions Precautions Precautions: Back Precaution Booklet Issued: Yes (comment) Required Braces or Orthoses: Spinal Brace Spinal Brace: Thoracolumbosacral orthotic;Applied in sitting position Restrictions Weight Bearing Restrictions: No       Mobility Bed Mobility Overal bed mobility: Needs Assistance Bed Mobility: Sit to Supine;Sit to Sidelying   Sidelying to sit: Mod assist;HOB elevated       General bed mobility comments: With HOB partially elevated pt needing mod A to powerup trunk to EOB. Mod A sit>sidelying to maintain precautions during transition movements, some assistance to powerup legs onto bed as well. Pt's spouse cannot physically assist with this at home.   Transfers Overall transfer level: Needs assistance Equipment used: Rolling walker (2 wheeled) Transfers: Sit to/from Stand Sit to Stand: Min assist         General transfer comment: from  EOB and 3n1. Slow and guarded. grimacing.    Balance Overall balance assessment: Needs assistance Sitting-balance support: Feet supported;No upper extremity supported Sitting balance-Leahy Scale: Good Sitting balance - Comments: cues for upright posture   Standing balance support: Bilateral upper extremity supported Standing balance-Leahy Scale: Fair Standing balance comment: stood at sink to wash hands with intermittent external support of sink .                   ADL Overall ADL's : Needs assistance/impaired     Grooming: Wash/dry hands;Min guard;Standing Grooming Details (indicate cue type and reason): leaning into sink for external support                 Toilet Transfer: Min Fish farm manager Details (indicate cue type and reason): ambulated to BR, cues for technique with rw   Toileting - Clothing Manipulation Details (indicate cue type and reason): Completed anterior pericare care in sitting position, still likely mod A with posterios pericare, discussed AE with pt and spouse.     Functional mobility during ADLs: Min guard;Rolling walker General ADL Comments: Pt completed bed mobility, in-room functional mobility, toilet transfer, and grooming task at sink. Discussed pt's difficulty with sit<>sidelying with pt's spouse unable to physically assist and no other help available. Discussed rental of hopital bed as possible solution. Pt moving slow and guarded, grimacing with movement.       Vision                     Perception     Praxis      Cognition   Behavior During Therapy: [facility] for  tasks assessed/performed Overall Cognitive Status: Within Functional Limits for tasks assessed                  General Comments: possible STM deficits at baseline?     Extremity/Trunk Assessment               Exercises     Shoulder Instructions       General Comments      Pertinent Vitals/ Pain       Pain Assessment: Faces Faces Pain  Scale: Hurts whole lot Pain Location: across low back  Pain Descriptors / Indicators: Grimacing Pain Intervention(s): Limited activity within patient's tolerance;Monitored during session;Repositioned;Other (comment) (Pt had just receieved oral pain med prior to OT.)  Home Living                                          Prior Functioning/Environment              Frequency  Min 2X/week        Progress Toward Goals  OT Goals(current goals can now be found in the care plan section)  Progress towards OT goals: Progressing toward goals  Acute Rehab OT Goals Patient Stated Goal: To get back to independence and working in the yard.  OT Goal Formulation: With patient/family Time For Goal Achievement: 10/21/16 Potential to Achieve Goals: Good ADL Goals Pt Will Perform Grooming: with modified independence;sitting;standing Pt Will Perform Lower Body Bathing: with modified independence;with adaptive equipment;sit to/from stand Pt Will Perform Lower Body Dressing: with modified independence;with adaptive equipment;sit to/from stand Pt Will Transfer to Toilet: with modified independence;ambulating Pt Will Perform Toileting - Clothing Manipulation and hygiene: with modified independence;sitting/lateral leans;sit to/from stand Pt Will Perform Tub/Shower Transfer: with supervision;ambulating;shower seat;rolling walker Additional ADL Goal #1: Pt will complete bed mobility at mod I level to prepare for OOB ADLs.   Plan Discharge plan remains appropriate    Co-evaluation                 End of Session Equipment Utilized During Treatment: Gait belt;Rolling walker;Back brace   Activity Tolerance Patient limited by pain   Patient Left in bed;with call bell/phone within reach;with family/visitor present   Nurse Communication Other (comment) (not ready for d/c today from OT standpoint)        Time: 1000-1031 OT Time Calculation (min): 31 min  Charges: OT  General Charges $OT Visit: 1 Procedure OT Treatments $Self Care/Home Management : 23-37 mins  Tanner Hensley 10/15/2016, 1:01 PM

## 2016-10-16 DIAGNOSIS — I2510 Atherosclerotic heart disease of native coronary artery without angina pectoris: Secondary | ICD-10-CM

## 2016-10-16 DIAGNOSIS — M5416 Radiculopathy, lumbar region: Secondary | ICD-10-CM

## 2016-10-16 LAB — BASIC METABOLIC PANEL
Anion gap: 10 (ref 5–15)
BUN: 12 mg/dL (ref 6–20)
CHLORIDE: 99 mmol/L — AB (ref 101–111)
CO2: 27 mmol/L (ref 22–32)
Calcium: 8.8 mg/dL — ABNORMAL LOW (ref 8.9–10.3)
Creatinine, Ser: 0.89 mg/dL (ref 0.61–1.24)
GFR calc non Af Amer: >60 mL/min (ref >60)
Glucose, Bld: 154 mg/dL — ABNORMAL HIGH (ref 65–99)
POTASSIUM: 4.3 mmol/L (ref 3.5–5.1)
SODIUM: 136 mmol/L (ref 135–145)

## 2016-10-16 LAB — CBC
HCT: 26.2 % — ABNORMAL LOW (ref 39.0–52.0)
HEMOGLOBIN: 9.1 g/dL — AB (ref 13.0–17.0)
MCH: 34.7 pg — AB (ref 26.0–34.0)
MCHC: 34.7 g/dL (ref 30.0–36.0)
MCV: 100 fL (ref 78.0–100.0)
Platelets: 99 10*3/uL — ABNORMAL LOW (ref 150–400)
RBC: 2.62 MIL/uL — AB (ref 4.22–5.81)
RDW: 13 % (ref 11.5–15.5)
WBC: 9.2 10*3/uL (ref 4.0–10.5)

## 2016-10-16 LAB — TROPONIN I: Troponin I: 0.03 ng/mL (ref <0.03)

## 2016-10-16 MED ORDER — HYDROCORTISONE 10 MG PO TABS: ORAL_TABLET | ORAL | 0 refills | Status: AC

## 2016-10-16 MED ORDER — HYDROCORTISONE NA SUCCINATE PF 100 MG IJ SOLR: 25.0000 mg | Freq: Two times a day (BID) | INTRAMUSCULAR | Status: DC

## 2016-10-16 NOTE — Progress Notes (Signed)
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 Patient was back to the room and went to the bathroom. After using the commode was standing up when patient felt dizzy and lost consciousness. The loss of consciousness was brief. Following which patient had weakness of both lower extremities. Denies any incontinence of urine or bowel  Assessment and plan #1. Syncope -suspect orthostatic syncope vs adrenal insufficiency contributing to low blood pressure, patient is status post fluid resuscitation  .  Marland KitchenMinimise narcotics and BNZ , continue to hold lisinopril at discharge, would resume metoprolol at 12.5 mg twice a day. Negative cardiac markers, d-dimer  elevated ,  CT angiogram of the chest negative for PE or PNA on CT angio chest  ,   cortisol  6.8  and lactate levels normalized . Chest x-ray negative and UA negative  .  Patient has history of polymyalgia rheumatica per the chart. Patient has not had any steroid for last 1 year, but may have adrenal crisis in the setting of recent surgery. Started on stress dose solucortef at 100 mg IV 1 dose, continue quick tapering, now down to 25 mg IV every 12. If going home would continue with hydrocortisone PO, 20 mg in am 10 mg in pm for 3 days ,then DC.  Blood pressure parameters did improve after being started on steroids. echo to r/o any cardiac issues .Will r/o any possible source of underlying infection as well. Have ordered BLOOD CULTURE .   CT angiogram of the neck shows Severe proximal right PCA stenosis, otherwise no  significant carotid artery stenosis.    #2. Bilateral lower extremity weakness - no evidence of acute CVA.   Dr. Lynann Bologna, orthopedic surgeon who feels this change could not be related to the surgery.   Neurology also consulted during this hospitalization   #3. Chronic atrial fibrillation and history of high degree AV block status post pacemaker placement - Coumadin on hold secondary to recent surgery. Patient metoprolol is on hold secondary to low normal blood pressure. Closely monitor in telemetry. Restart metoprolol at a lower dose. Resume anticoagulation when able from a surgical standpoint, bleeding risk standpoint  #4. History of CAD status post stenting - cardiologist notes reviewed. Patient has had normal cardiac cath in 2016. Cardiac markers negative.  #5. Hypertension -management of blood pressure medications as above, continue stress dose steroids. Initiate low-dose beta blocker once adrenal insufficiency improves and blood pressure is stabilized.   #6 Dilated ascending aorta. Recommend annual imaging followup by CTA,or MRA  #7-low-grade fever-UA negative, CT chest without evidence of pneumonia, suspect atelectasis, continue incentive spirometry   DVT prophylaxsis PER  neurosurgery  Code Status:  Full code    Family Communication: Discussed in detail with the patient/wife/son, all imaging results, lab results explained to the patient   Disposition Plan:  Anticipate discharge when ok with orthopedics      Consultants:  Neurology  Procedures:  None  Antibiotics: Anti-infectives    Start     Dose/Rate Route Frequency Ordered Stop   10/13/16 1830  ceFAZolin (ANCEF) IVPB  2g/100 mL premix     2 g 200 mL/hr over 30 Minutes Intravenous Every 8 hours 10/13/16 1825 10/14/16 0404   10/13/16 0914  ceFAZolin (ANCEF) IVPB 2g/100 mL premix     2 g 200 mL/hr over 30 Minutes Intravenous On call to O.R. 10/13/16 0914 10/13/16 1237         HPI/Subjective: Low-grade fever  but no focal symptoms of cough, shortness of breath  Objective: Vitals:   10/15/16 2005 10/15/16 2124 10/16/16 0050 10/16/16 0548  BP: 117/67 (!) 112/58 (!) 121/56 137/69  Pulse: 83 81 80 84  Resp:  20 20 20   Temp:  98.9 F (37.2 C) 98.3 F (36.8 C) 98.3 F (36.8 C)  TempSrc:  Oral Oral Oral  SpO2:  94% 95% 99%  Weight:      Height:       No intake or output data in the 24 hours ending 10/16/16 0844  Exam:  Examination:  General exam: Appears calm and comfortable  Respiratory system: Clear to auscultation. Respiratory effort normal. Cardiovascular system: S1 & S2 heard, RRR. No JVD, murmurs, rubs, gallops or clicks. No pedal edema. Gastrointestinal system: Abdomen is nondistended, soft and nontender. No organomegaly or masses felt. Normal bowel sounds heard. Central nervous system: Alert and oriented. No focal neurological deficits. Extremities: Symmetric 5 x 5 power. Skin: No rashes, lesions or ulcers Psychiatry: Judgement and insight appear normal. Mood & affect appropriate.     Data Reviewed: I have personally reviewed following labs and imaging studies  Micro Results Recent Results (from the past 240 hour(s))  Surgical pcr screen     Status: None   Collection Time: 10/08/16  3:17 PM  Result Value Ref Range Status   MRSA, PCR NEGATIVE NEGATIVE Final   Staphylococcus aureus NEGATIVE NEGATIVE Final    Comment:        The Xpert SA Assay (FDA approved for NASAL specimens in patients over 58 years of age), is one component of a comprehensive surveillance program.  Test performance has been validated by [facility] for patients greater than or equal to 75 year old. It is not intended to diagnose infection nor to guide or monitor treatment.     Radiology Reports Ct Angio Head W Or Wo Contrast  Result Date: 10/14/2016 CLINICAL DATA:  Lower extremity weakness and syncopal episode. EXAM: CT ANGIOGRAPHY HEAD AND NECK TECHNIQUE: Multidetector CT imaging of the head  and neck was performed using the standard protocol during bolus administration of intravenous contrast. Multiplanar CT image reconstructions and MIPs were obtained to evaluate the vascular anatomy. Carotid stenosis measurements (when applicable) are obtained utilizing NASCET criteria, using the distal internal carotid diameter as the denominator. CONTRAST:  50 mL Isovue 370 COMPARISON:  Noncontrast head CT 10/13/2016 FINDINGS: CTA NECK FINDINGS Aortic arch: Normal variant aortic arch branching with common origin of the brachiocephalic and left common carotid arteries. There is aneurysmal dilatation of the aortic arch, incompletely imaged and more fully evaluated on concurrent chest CTA. Moderate predominantly calcified plaque in the aortic arch. Mild narrowing of the right subclavian artery near its origin. Right carotid system: Mild scattered calcified and soft plaque in the common carotid artery without stenosis. Moderate calcified plaque at the carotid bifurcation and in the proximal ICA without significant stenosis. Tortuous and ectatic distal cervical ICA. Left carotid system: Common carotid artery demonstrates mild atherosclerotic luminal irregularity without stenosis. There is moderate predominantly calcified plaque at the carotid bifurcation and in the proximal ICA without significant stenosis.  Tortuous and ectatic distal cervical ICA. Vertebral arteries: Patent with the right being moderately to strongly dominant. Calcified plaque at the right vertebral artery origin results in moderate stenosis. Skeleton: Advanced disc degeneration at C3-4 and C4-5. Advanced left facet arthrosis at C2-3. Other neck: No mass or lymph node enlargement. Upper chest: More fully evaluated on concurrent chest CTA. Review of the MIP images confirms the above findings CTA HEAD FINDINGS Anterior circulation: The internal carotid arteries are patent from the skullbase to carotid termini with moderately advanced siphon calcified  atherosclerosis bilaterally resulting in mild ophthalmic segment/ proximal supraclinoid stenosis bilaterally. ACAs and MCAs are patent without evidence of major branch occlusion. There is mild left M1 stenosis, and there is moderate branch vessel atherosclerotic irregularity and narrowing throughout the anterior circulation. No aneurysm. Posterior circulation: Intracranial vertebral arteries are patent to the basilar with the right being dominant. PICA and SCA origins are patent. Basilar artery is patent with mild luminal irregularity but no stenosis. There is a fetal origin of the left PCA. There is a severe right PCA stenosis near the P1-P2 junction, with diffuse irregularity and up to moderate stenosis involving the right P2 segment. There is also a moderate to severe distal left P2 stenosis, and there is moderate disease affecting the P3 segments and more distal branch vessels bilaterally. No aneurysm. Venous sinuses: Patent. Anatomic variants: None. Delayed phase: No abnormal enhancement. Review of the MIP images confirms the above findings IMPRESSION: 1. No large vessel occlusion. 2. Moderately advanced intracranial atherosclerosis. Mild bilateral ICA stenosis. 3. Severe proximal right PCA stenosis. 4. Moderate bilateral cervical carotid artery atherosclerosis without stenosis. 5. Moderate proximal right vertebral artery stenosis. 6. Thoracic aortic aneurysm.  See separate chest CTA. Electronically Signed   By: Logan Bores M.D.   On: 10/14/2016 12:07   Dg Chest 2 View  Result Date: 10/08/2016 CLINICAL DATA:  Lumbar fusion . EXAM: CHEST  2 VIEW COMPARISON:  12/30/2015. FINDINGS: Cardiac pacer with lead tips in right atrium right ventricle. Stable cardiomegaly. No focal infiltrate. Biapical pleural-parenchymal thickening noted consistent with scarring. Chest is stable from prior exam . IMPRESSION: No acute cardiopulmonary disease. Cardiac pacer stable position. Stable cardiomegaly. Electronically Signed    By: Marcello Moores  Register   On: 10/08/2016 17:03   Dg Lumbar Spine 2-3 Views  Result Date: 10/13/2016 CLINICAL DATA:  L3-4 TLIF EXAM: DG C-ARM 61-120 MIN; LUMBAR SPINE - 2-3 VIEW COMPARISON:  None. FINDINGS: 45 seconds of fluoroscopic time utilized for talar half of the elbow 3 4 interspace. Hardware noted within the pedicles of L3 and L4 without breech of vertebral endplates. Interbody disc noted at L3-4. IMPRESSION: L3-4 transforaminal lumbar interbody fusion. Electronically Signed   By: Ashley Royalty M.D.   On: 10/13/2016 17:58   Ct Head Wo Contrast  Result Date: 10/13/2016 CLINICAL DATA:  Bilateral lower extremity weakness EXAM: CT HEAD WITHOUT CONTRAST TECHNIQUE: Contiguous axial images were obtained from the base of the skull through the vertex without intravenous contrast. COMPARISON:  Head CT 02/21/2014 FINDINGS: Brain: No mass lesion, intraparenchymal hemorrhage or extra-axial collection. No evidence of acute cortical infarct. There is confluent periventricular hypoattenuation compatible with chronic microvascular disease. Vascular: No hyperdense vessel or unexpected calcification. Skull: Normal visualized skull base, calvarium and extracranial soft tissues. Sinuses/Orbits: No sinus fluid levels or advanced mucosal thickening. No mastoid effusion. Normal orbits. IMPRESSION: 1. No acute intracranial abnormality. 2. Advanced chronic microvascular ischemia. Electronically Signed   By: Ulyses Jarred M.D.   On: 10/13/2016 22:02  Ct Angio Neck W Or Wo Contrast  Result Date: 10/14/2016 CLINICAL DATA:  Lower extremity weakness and syncopal episode. EXAM: CT ANGIOGRAPHY HEAD AND NECK TECHNIQUE: Multidetector CT imaging of the head and neck was performed using the standard protocol during bolus administration of intravenous contrast. Multiplanar CT image reconstructions and MIPs were obtained to evaluate the vascular anatomy. Carotid stenosis measurements (when applicable) are obtained utilizing NASCET criteria,  using the distal internal carotid diameter as the denominator. CONTRAST:  50 mL Isovue 370 COMPARISON:  Noncontrast head CT 10/13/2016 FINDINGS: CTA NECK FINDINGS Aortic arch: Normal variant aortic arch branching with common origin of the brachiocephalic and left common carotid arteries. There is aneurysmal dilatation of the aortic arch, incompletely imaged and more fully evaluated on concurrent chest CTA. Moderate predominantly calcified plaque in the aortic arch. Mild narrowing of the right subclavian artery near its origin. Right carotid system: Mild scattered calcified and soft plaque in the common carotid artery without stenosis. Moderate calcified plaque at the carotid bifurcation and in the proximal ICA without significant stenosis. Tortuous and ectatic distal cervical ICA. Left carotid system: Common carotid artery demonstrates mild atherosclerotic luminal irregularity without stenosis. There is moderate predominantly calcified plaque at the carotid bifurcation and in the proximal ICA without significant stenosis. Tortuous and ectatic distal cervical ICA. Vertebral arteries: Patent with the right being moderately to strongly dominant. Calcified plaque at the right vertebral artery origin results in moderate stenosis. Skeleton: Advanced disc degeneration at C3-4 and C4-5. Advanced left facet arthrosis at C2-3. Other neck: No mass or lymph node enlargement. Upper chest: More fully evaluated on concurrent chest CTA. Review of the MIP images confirms the above findings CTA HEAD FINDINGS Anterior circulation: The internal carotid arteries are patent from the skullbase to carotid termini with moderately advanced siphon calcified atherosclerosis bilaterally resulting in mild ophthalmic segment/ proximal supraclinoid stenosis bilaterally. ACAs and MCAs are patent without evidence of major branch occlusion. There is mild left M1 stenosis, and there is moderate branch vessel atherosclerotic irregularity and narrowing  throughout the anterior circulation. No aneurysm. Posterior circulation: Intracranial vertebral arteries are patent to the basilar with the right being dominant. PICA and SCA origins are patent. Basilar artery is patent with mild luminal irregularity but no stenosis. There is a fetal origin of the left PCA. There is a severe right PCA stenosis near the P1-P2 junction, with diffuse irregularity and up to moderate stenosis involving the right P2 segment. There is also a moderate to severe distal left P2 stenosis, and there is moderate disease affecting the P3 segments and more distal branch vessels bilaterally. No aneurysm. Venous sinuses: Patent. Anatomic variants: None. Delayed phase: No abnormal enhancement. Review of the MIP images confirms the above findings IMPRESSION: 1. No large vessel occlusion. 2. Moderately advanced intracranial atherosclerosis. Mild bilateral ICA stenosis. 3. Severe proximal right PCA stenosis. 4. Moderate bilateral cervical carotid artery atherosclerosis without stenosis. 5. Moderate proximal right vertebral artery stenosis. 6. Thoracic aortic aneurysm.  See separate chest CTA. Electronically Signed   By: Logan Bores M.D.   On: 10/14/2016 12:07   Ct Angio Chest Pe W Or Wo Contrast  Result Date: 10/14/2016 CLINICAL DATA:  Syncopal episode last night. Lower extremity weakness. Strength has improved overnight. 10/13/2016 EXAM: CT ANGIOGRAPHY CHEST WITH CONTRAST TECHNIQUE: Multidetector CT imaging of the chest was performed using the standard protocol during bolus administration of intravenous contrast. Multiplanar CT image reconstructions and MIPs were obtained to evaluate the vascular anatomy. CONTRAST:  80 cc Isovue 370  COMPARISON:  Chest x-ray 10/13/2016 FINDINGS: Cardiovascular: Coronary stents and coronary artery calcifications are present. The pulmonary arteries are well opacified. There is no acute pulmonary embolus. Left-sided transvenous pacemaker leads to the right atrium and  right ventricle. There is atherosclerotic calcification of the mitral annulus. There is atherosclerotic calcification of the thoracic aorta. Ascending aorta is 4.4 cm. Mediastinum/Nodes: Precarinal lymph node is 1.4 cm. Right paratracheal lymph node is 1.3 cm. Right hilar node is 1.3 cm. Left hilar node is 1.6 cm. Small hiatal hernia. Lungs/Pleura: There are focal areas of air trapping. Minimal bibasilar atelectasis. No suspicious pulmonary nodules. No focal consolidations. Trace pleural effusions. Upper Abdomen: Hiatal hernia.  Otherwise unremarkable. Musculoskeletal: Thoracic osteophytes are present. No acute osseous abnormality. Review of the MIP images confirms the above findings. IMPRESSION: 1.  No evidence for acute  abnormality. 2. Coronary artery disease. 3. No evidence for acute pulmonary embolus. 4. Small mediastinal and hilar lymph nodes may be reactive given the history of CHF. 5. Mild mosaic appearance of the lungs consistent with air trapping. Trace pleural effusions. 6. Dilated ascending aorta. Recommend annual imaging followup by CTA or MRA. This recommendation follows 2010 ACCF/AHA/AATS/ACR/ASA/SCA/SCAI/SIR/STS/SVM Guidelines for the Diagnosis and Management of Patients with Thoracic Aortic Disease. Circulation. 2010; [id number] New Electronically Signed   By: Nolon Nations M.D.   On: 10/14/2016 11:56   Ct Lumbar Spine Wo Contrast  Result Date: 10/14/2016 CLINICAL DATA:  Syncopal episode and lower extremity weakness. Lumbar fusion knee yesterday. EXAM: CT LUMBAR SPINE WITHOUT CONTRAST TECHNIQUE: Multidetector CT imaging of the lumbar spine was performed without intravenous contrast administration. Multiplanar CT image reconstructions were also generated. COMPARISON:  Lumbar CT myelogram 09/28/2016 FINDINGS: Segmentation: 5 lumbar type vertebrae. Alignment: Slight thoracolumbar dextroscoliosis, unchanged. No listhesis. Vertebrae: Sequelae of interval L3-4 decompression and fusion are  identified. Pedicle screws and interconnecting rods are present bilaterally. The left L4 screw terminates just lateral to the vertebral body. An interbody graft is in place. No acute fracture is identified. Vertebral body heights are preserved. Paraspinal and other soft tissues: Postoperative changes in the posterior soft tissues related to recent surgery with gas and unorganized fluid which also extends into the left epidural space and neural foramen at L3-4. Disc levels: Interval postoperative changes at L3-4 with left greater than right laminectomies and partial facetectomies. Limited assessment of the spinal canal due to postoperative changes and metallic streak artifact. There is mild-to-moderate right neural foraminal stenosis at L3-4 due to disc bulging and spurring, unchanged. Mild disc bulging elsewhere throughout the lumbar spine is similar to the recent myelogram. IMPRESSION: 1. Interval postoperative changes from recent L3-4 decompression and fusion as above. 2. No evidence of acute osseous abnormality. Electronically Signed   By: Logan Bores M.D.   On: 10/14/2016 12:30   Ct Lumbar Spine W Contrast  Result Date: 09/28/2016 CLINICAL DATA:  Lumbar radiculopathy. Low back and left lower extremity pain. EXAM: LUMBAR MYELOGRAM FLUOROSCOPY TIME:  Radiation Exposure Index (as provided by the fluoroscopic device): 267.44 microGray*m^2 Fluoroscopy Time (in minutes and seconds):  27 seconds PROCEDURE: After thorough discussion of risks and benefits of the procedure including bleeding, infection, injury to nerves, blood vessels, adjacent structures as well as headache and CSF leak, written and oral informed consent was obtained. Consent was obtained by Dr. Logan Bores. Time out form was completed. Patient was positioned prone on the fluoroscopy table. Local anesthesia was provided with 1% lidocaine without epinephrine after prepped and draped in the usual sterile fashion. Puncture was  performed at L4-5 using a  3 1/2 inch 22-gauge spinal needle via a right interlaminar approach. Using a single pass through the dura, the needle was placed within the thecal sac, with return of clear CSF. 15 mL of Isovue M 200 was injected into the thecal sac, with normal opacification of the nerve roots and cauda equina consistent with free flow within the subarachnoid space. I personally performed the lumbar puncture and administered the intrathecal contrast. I also personally supervised acquisition of the myelogram images. TECHNIQUE: Contiguous axial images were obtained through the Lumbar spine after the intrathecal infusion of infusion. Coronal and sagittal reconstructions were obtained of the axial image sets. COMPARISON:  Lumbar spine radiographs 08/27/2016 FINDINGS: LUMBAR MYELOGRAM FINDINGS: Lumbar segmentation is normal. There is very mild thoracolumbar dextroscoliosis without listhesis or evidence of dynamic instability on upright flexion and extension images. Disc space narrowing and degenerative endplate sclerosis and osteophytosis are present at L3-4. There is mild-to-moderate waist like narrowing of the thecal sac at this level with asymmetric left lateral recess stenosis and left L4 nerve root encroachment. There is also evidence of left L3 nerve root cut off near the entrance to the neural foramen. Spinal stenosis at this level does not appear significantly changed with upright positioning. A shallow ventral extradural defect is present at L4-5. CT LUMBAR MYELOGRAM FINDINGS: There is very mild thoracolumbar dextroscoliosis. There is no listhesis. Vertebral body heights are preserved without evidence of fracture or destructive osseous lesion. The conus medullaris terminates at T12-L1. The diffuse atherosclerotic calcification is noted of the abdominal aorta and its major branch vessels. There is mild ectasia of the infrarenal abdominal aorta. T12-L1:  Negative. L1-2:  Mild disc bulging without stenosis. L2-3:  Mild disc  bulging without stenosis. L3-4: Mild-to-moderate disc space narrowing. Circumferential disc bulging greater to the left and mild-to-moderate facet and ligamentum flavum hypertrophy result in mild-to-moderate spinal stenosis, mild right and mild-to-moderate left lateral recess stenosis. Additionally, there is a left foraminal disc extrusion which results in severe medial foraminal stenosis and left L3 nerve root impingement. L4-5: Mild circumferential disc bulging and mild facet and ligamentum flavum hypertrophy result in mild right neural foraminal stenosis and mild ventral thecal sac flattening without significant spinal stenosis. L5-S1:  Mild disc bulging greater to the right without stenosis. IMPRESSION: 1. L3-4 disc extrusion resulting in severe left neural foraminal stenosis and left L3 nerve root impingement. 2. Mild-to-moderate multifactorial spinal and left greater than right lateral recess stenosis at L3-4. 3. Mild disc and facet degeneration elsewhere without stenosis. Electronically Signed   By: Logan Bores M.D.   On: 09/28/2016 14:08   Dg Lumbar Spine 1 View  Result Date: 10/13/2016 CLINICAL DATA:  L3-4 interbody fusion EXAM: LUMBAR SPINE - 1 VIEW COMPARISON:  Myelogram 09/28/2016 FINDINGS: Initial lateral film shows the lower needle at the spinous process of L3 an the upper needle at the interspinous space of L1-2. IMPRESSION: Spinous process at L3 and interspinous space L1-2 localized. Electronically Signed   By: Nelson Chimes M.D.   On: 10/13/2016 15:10   Dg Chest Port 1 View  Result Date: 10/13/2016 CLINICAL DATA:  Weakness EXAM: PORTABLE CHEST 1 VIEW COMPARISON:  10/08/2016 FINDINGS: Left-sided pacing device, similar compared to prior. No acute infiltrate or effusion. Stable cardiomediastinal silhouette with atherosclerosis. No pneumothorax. IMPRESSION: No radiographic evidence for acute cardiopulmonary abnormality Electronically Signed   By: Donavan Foil M.D.   On: 10/13/2016 20:42   Dg  C-arm 1-60 Min  Result Date: 10/13/2016 CLINICAL  DATA:  L3-4 TLIF EXAM: DG C-ARM 61-120 MIN; LUMBAR SPINE - 2-3 VIEW COMPARISON:  None. FINDINGS: 45 seconds of fluoroscopic time utilized for talar half of the elbow 3 4 interspace. Hardware noted within the pedicles of L3 and L4 without breech of vertebral endplates. Interbody disc noted at L3-4. IMPRESSION: L3-4 transforaminal lumbar interbody fusion. Electronically Signed   By: Ashley Royalty M.D.   On: 10/13/2016 17:58   Dg Myelography Lumbar Inj Lumbosacral  Result Date: 09/28/2016 CLINICAL DATA:  Lumbar radiculopathy. Low back and left lower extremity pain. EXAM: LUMBAR MYELOGRAM FLUOROSCOPY TIME:  Radiation Exposure Index (as provided by the fluoroscopic device): 267.44 microGray*m^2 Fluoroscopy Time (in minutes and seconds):  27 seconds PROCEDURE: After thorough discussion of risks and benefits of the procedure including bleeding, infection, injury to nerves, blood vessels, adjacent structures as well as headache and CSF leak, written and oral informed consent was obtained. Consent was obtained by Dr. Logan Bores. Time out form was completed. Patient was positioned prone on the fluoroscopy table. Local anesthesia was provided with 1% lidocaine without epinephrine after prepped and draped in the usual sterile fashion. Puncture was performed at L4-5 using a 3 1/2 inch 22-gauge spinal needle via a right interlaminar approach. Using a single pass through the dura, the needle was placed within the thecal sac, with return of clear CSF. 15 mL of Isovue M 200 was injected into the thecal sac, with normal opacification of the nerve roots and cauda equina consistent with free flow within the subarachnoid space. I personally performed the lumbar puncture and administered the intrathecal contrast. I also personally supervised acquisition of the myelogram images. TECHNIQUE: Contiguous axial images were obtained through the Lumbar spine after the intrathecal infusion of  infusion. Coronal and sagittal reconstructions were obtained of the axial image sets. COMPARISON:  Lumbar spine radiographs 08/27/2016 FINDINGS: LUMBAR MYELOGRAM FINDINGS: Lumbar segmentation is normal. There is very mild thoracolumbar dextroscoliosis without listhesis or evidence of dynamic instability on upright flexion and extension images. Disc space narrowing and degenerative endplate sclerosis and osteophytosis are present at L3-4. There is mild-to-moderate waist like narrowing of the thecal sac at this level with asymmetric left lateral recess stenosis and left L4 nerve root encroachment. There is also evidence of left L3 nerve root cut off near the entrance to the neural foramen. Spinal stenosis at this level does not appear significantly changed with upright positioning. A shallow ventral extradural defect is present at L4-5. CT LUMBAR MYELOGRAM FINDINGS: There is very mild thoracolumbar dextroscoliosis. There is no listhesis. Vertebral body heights are preserved without evidence of fracture or destructive osseous lesion. The conus medullaris terminates at T12-L1. The diffuse atherosclerotic calcification is noted of the abdominal aorta and its major branch vessels. There is mild ectasia of the infrarenal abdominal aorta. T12-L1:  Negative. L1-2:  Mild disc bulging without stenosis. L2-3:  Mild disc bulging without stenosis. L3-4: Mild-to-moderate disc space narrowing. Circumferential disc bulging greater to the left and mild-to-moderate facet and ligamentum flavum hypertrophy result in mild-to-moderate spinal stenosis, mild right and mild-to-moderate left lateral recess stenosis. Additionally, there is a left foraminal disc extrusion which results in severe medial foraminal stenosis and left L3 nerve root impingement. L4-5: Mild circumferential disc bulging and mild facet and ligamentum flavum hypertrophy result in mild right neural foraminal stenosis and mild ventral thecal sac flattening without  significant spinal stenosis. L5-S1:  Mild disc bulging greater to the right without stenosis. IMPRESSION: 1. L3-4 disc extrusion resulting in severe left neural foraminal  stenosis and left L3 nerve root impingement. 2. Mild-to-moderate multifactorial spinal and left greater than right lateral recess stenosis at L3-4. 3. Mild disc and facet degeneration elsewhere without stenosis. Electronically Signed   By: Logan Bores M.D.   On: 09/28/2016 14:08     CBC  Recent Labs Lab 10/13/16 2049 10/15/16 0221 10/16/16 0522  WBC 11.0* 10.1 9.2  HGB 11.7* 9.7* 9.1*  HCT 34.4* 28.6* 26.2*  PLT 107* 96* 99*  MCV 100.6* 101.4* 100.0  MCH 34.2* 34.4* 34.7*  MCHC 34.0 33.9 34.7  RDW 12.9 12.9 13.0  LYMPHSABS 0.5*  --   --   MONOABS 0.3  --   --   EOSABS 0.0  --   --   BASOSABS 0.0  --   --     Chemistries   Recent Labs Lab 10/13/16 2049 10/15/16 0221 10/16/16 0522  NA 137 139 136  K 4.2 4.3 4.3  CL 102 105 99*  CO2 28 26 27   GLUCOSE 148* 92 154*  BUN 14 15 12   CREATININE 0.94 1.06 0.89  CALCIUM 9.0 8.8* 8.8*  AST 28 34  --   ALT 17 13*  --   ALKPHOS 57 45  --   BILITOT 0.8 0.7  --    ------------------------------------------------------------------------------------------------------------------ estimated creatinine clearance is 69 mL/min (by C-G formula based on SCr of 0.89 mg/dL). ------------------------------------------------------------------------------------------------------------------ No results for input(s): HGBA1C in the last 72 hours. ------------------------------------------------------------------------------------------------------------------ No results for input(s): CHOL, HDL, LDLCALC, TRIG, CHOLHDL, LDLDIRECT in the last 72 hours. ------------------------------------------------------------------------------------------------------------------ No results for input(s): TSH, T4TOTAL, T3FREE, THYROIDAB in the last 72 hours.  Invalid input(s):  FREET3 ------------------------------------------------------------------------------------------------------------------ No results for input(s): VITAMINB12, FOLATE, FERRITIN, TIBC, IRON, RETICCTPCT in the last 72 hours.  Coagulation profile  Recent Labs Lab 10/13/16 0953  INR 1.23     Recent Labs  10/13/16 2049  DDIMER 1.35*    Cardiac Enzymes  Recent Labs Lab 10/15/16 1833 10/15/16 2126 10/16/16 0522  TROPONINI <0.03 <0.03 0.03*   ------------------------------------------------------------------------------------------------------------------ Invalid input(s): POCBNP   CBG:  Recent Labs Lab 10/14/16 2157 10/15/16 0608 10/15/16 1131 10/15/16 1708 10/15/16 2123  GLUCAP 122* 86 85 91 132*       Studies: Ct Angio Head W Or Wo Contrast  Result Date: 10/14/2016 CLINICAL DATA:  Lower extremity weakness and syncopal episode. EXAM: CT ANGIOGRAPHY HEAD AND NECK TECHNIQUE: Multidetector CT imaging of the head and neck was performed using the standard protocol during bolus administration of intravenous contrast. Multiplanar CT image reconstructions and MIPs were obtained to evaluate the vascular anatomy. Carotid stenosis measurements (when applicable) are obtained utilizing NASCET criteria, using the distal internal carotid diameter as the denominator. CONTRAST:  50 mL Isovue 370 COMPARISON:  Noncontrast head CT 10/13/2016 FINDINGS: CTA NECK FINDINGS Aortic arch: Normal variant aortic arch branching with common origin of the brachiocephalic and left common carotid arteries. There is aneurysmal dilatation of the aortic arch, incompletely imaged and more fully evaluated on concurrent chest CTA. Moderate predominantly calcified plaque in the aortic arch. Mild narrowing of the right subclavian artery near its origin. Right carotid system: Mild scattered calcified and soft plaque in the common carotid artery without stenosis. Moderate calcified plaque at the carotid bifurcation  and in the proximal ICA without significant stenosis. Tortuous and ectatic distal cervical ICA. Left carotid system: Common carotid artery demonstrates mild atherosclerotic luminal irregularity without stenosis. There is moderate predominantly calcified plaque at the carotid bifurcation and in the proximal ICA without significant stenosis. Tortuous and ectatic  distal cervical ICA. Vertebral arteries: Patent with the right being moderately to strongly dominant. Calcified plaque at the right vertebral artery origin results in moderate stenosis. Skeleton: Advanced disc degeneration at C3-4 and C4-5. Advanced left facet arthrosis at C2-3. Other neck: No mass or lymph node enlargement. Upper chest: More fully evaluated on concurrent chest CTA. Review of the MIP images confirms the above findings CTA HEAD FINDINGS Anterior circulation: The internal carotid arteries are patent from the skullbase to carotid termini with moderately advanced siphon calcified atherosclerosis bilaterally resulting in mild ophthalmic segment/ proximal supraclinoid stenosis bilaterally. ACAs and MCAs are patent without evidence of major branch occlusion. There is mild left M1 stenosis, and there is moderate branch vessel atherosclerotic irregularity and narrowing throughout the anterior circulation. No aneurysm. Posterior circulation: Intracranial vertebral arteries are patent to the basilar with the right being dominant. PICA and SCA origins are patent. Basilar artery is patent with mild luminal irregularity but no stenosis. There is a fetal origin of the left PCA. There is a severe right PCA stenosis near the P1-P2 junction, with diffuse irregularity and up to moderate stenosis involving the right P2 segment. There is also a moderate to severe distal left P2 stenosis, and there is moderate disease affecting the P3 segments and more distal branch vessels bilaterally. No aneurysm. Venous sinuses: Patent. Anatomic variants: None. Delayed phase: No  abnormal enhancement. Review of the MIP images confirms the above findings IMPRESSION: 1. No large vessel occlusion. 2. Moderately advanced intracranial atherosclerosis. Mild bilateral ICA stenosis. 3. Severe proximal right PCA stenosis. 4. Moderate bilateral cervical carotid artery atherosclerosis without stenosis. 5. Moderate proximal right vertebral artery stenosis. 6. Thoracic aortic aneurysm.  See separate chest CTA. Electronically Signed   By: Logan Bores M.D.   On: 10/14/2016 12:07   Ct Angio Neck W Or Wo Contrast  Result Date: 10/14/2016 CLINICAL DATA:  Lower extremity weakness and syncopal episode. EXAM: CT ANGIOGRAPHY HEAD AND NECK TECHNIQUE: Multidetector CT imaging of the head and neck was performed using the standard protocol during bolus administration of intravenous contrast. Multiplanar CT image reconstructions and MIPs were obtained to evaluate the vascular anatomy. Carotid stenosis measurements (when applicable) are obtained utilizing NASCET criteria, using the distal internal carotid diameter as the denominator. CONTRAST:  50 mL Isovue 370 COMPARISON:  Noncontrast head CT 10/13/2016 FINDINGS: CTA NECK FINDINGS Aortic arch: Normal variant aortic arch branching with common origin of the brachiocephalic and left common carotid arteries. There is aneurysmal dilatation of the aortic arch, incompletely imaged and more fully evaluated on concurrent chest CTA. Moderate predominantly calcified plaque in the aortic arch. Mild narrowing of the right subclavian artery near its origin. Right carotid system: Mild scattered calcified and soft plaque in the common carotid artery without stenosis. Moderate calcified plaque at the carotid bifurcation and in the proximal ICA without significant stenosis. Tortuous and ectatic distal cervical ICA. Left carotid system: Common carotid artery demonstrates mild atherosclerotic luminal irregularity without stenosis. There is moderate predominantly calcified plaque at  the carotid bifurcation and in the proximal ICA without significant stenosis. Tortuous and ectatic distal cervical ICA. Vertebral arteries: Patent with the right being moderately to strongly dominant. Calcified plaque at the right vertebral artery origin results in moderate stenosis. Skeleton: Advanced disc degeneration at C3-4 and C4-5. Advanced left facet arthrosis at C2-3. Other neck: No mass or lymph node enlargement. Upper chest: More fully evaluated on concurrent chest CTA. Review of the MIP images confirms the above findings CTA HEAD FINDINGS Anterior circulation:  The internal carotid arteries are patent from the skullbase to carotid termini with moderately advanced siphon calcified atherosclerosis bilaterally resulting in mild ophthalmic segment/ proximal supraclinoid stenosis bilaterally. ACAs and MCAs are patent without evidence of major branch occlusion. There is mild left M1 stenosis, and there is moderate branch vessel atherosclerotic irregularity and narrowing throughout the anterior circulation. No aneurysm. Posterior circulation: Intracranial vertebral arteries are patent to the basilar with the right being dominant. PICA and SCA origins are patent. Basilar artery is patent with mild luminal irregularity but no stenosis. There is a fetal origin of the left PCA. There is a severe right PCA stenosis near the P1-P2 junction, with diffuse irregularity and up to moderate stenosis involving the right P2 segment. There is also a moderate to severe distal left P2 stenosis, and there is moderate disease affecting the P3 segments and more distal branch vessels bilaterally. No aneurysm. Venous sinuses: Patent. Anatomic variants: None. Delayed phase: No abnormal enhancement. Review of the MIP images confirms the above findings IMPRESSION: 1. No large vessel occlusion. 2. Moderately advanced intracranial atherosclerosis. Mild bilateral ICA stenosis. 3. Severe proximal right PCA stenosis. 4. Moderate bilateral  cervical carotid artery atherosclerosis without stenosis. 5. Moderate proximal right vertebral artery stenosis. 6. Thoracic aortic aneurysm.  See separate chest CTA. Electronically Signed   By: Logan Bores M.D.   On: 10/14/2016 12:07   Ct Angio Chest Pe W Or Wo Contrast  Result Date: 10/14/2016 CLINICAL DATA:  Syncopal episode last night. Lower extremity weakness. Strength has improved overnight. 10/13/2016 EXAM: CT ANGIOGRAPHY CHEST WITH CONTRAST TECHNIQUE: Multidetector CT imaging of the chest was performed using the standard protocol during bolus administration of intravenous contrast. Multiplanar CT image reconstructions and MIPs were obtained to evaluate the vascular anatomy. CONTRAST:  80 cc Isovue 370 COMPARISON:  Chest x-ray 10/13/2016 FINDINGS: Cardiovascular: Coronary stents and coronary artery calcifications are present. The pulmonary arteries are well opacified. There is no acute pulmonary embolus. Left-sided transvenous pacemaker leads to the right atrium and right ventricle. There is atherosclerotic calcification of the mitral annulus. There is atherosclerotic calcification of the thoracic aorta. Ascending aorta is 4.4 cm. Mediastinum/Nodes: Precarinal lymph node is 1.4 cm. Right paratracheal lymph node is 1.3 cm. Right hilar node is 1.3 cm. Left hilar node is 1.6 cm. Small hiatal hernia. Lungs/Pleura: There are focal areas of air trapping. Minimal bibasilar atelectasis. No suspicious pulmonary nodules. No focal consolidations. Trace pleural effusions. Upper Abdomen: Hiatal hernia.  Otherwise unremarkable. Musculoskeletal: Thoracic osteophytes are present. No acute osseous abnormality. Review of the MIP images confirms the above findings. IMPRESSION: 1.  No evidence for acute  abnormality. 2. Coronary artery disease. 3. No evidence for acute pulmonary embolus. 4. Small mediastinal and hilar lymph nodes may be reactive given the history of CHF. 5. Mild mosaic appearance of the lungs consistent with  air trapping. Trace pleural effusions. 6. Dilated ascending aorta. Recommend annual imaging followup by CTA or MRA. This recommendation follows 2010 ACCF/AHA/AATS/ACR/ASA/SCA/SCAI/SIR/STS/SVM Guidelines for the Diagnosis and Management of Patients with Thoracic Aortic Disease. Circulation. 2010; [id number] New Electronically Signed   By: Nolon Nations M.D.   On: 10/14/2016 11:56   Ct Lumbar Spine Wo Contrast  Result Date: 10/14/2016 CLINICAL DATA:  Syncopal episode and lower extremity weakness. Lumbar fusion knee yesterday. EXAM: CT LUMBAR SPINE WITHOUT CONTRAST TECHNIQUE: Multidetector CT imaging of the lumbar spine was performed without intravenous contrast administration. Multiplanar CT image reconstructions were also generated. COMPARISON:  Lumbar CT myelogram 09/28/2016 FINDINGS: Segmentation: 5  lumbar type vertebrae. Alignment: Slight thoracolumbar dextroscoliosis, unchanged. No listhesis. Vertebrae: Sequelae of interval L3-4 decompression and fusion are identified. Pedicle screws and interconnecting rods are present bilaterally. The left L4 screw terminates just lateral to the vertebral body. An interbody graft is in place. No acute fracture is identified. Vertebral body heights are preserved. Paraspinal and other soft tissues: Postoperative changes in the posterior soft tissues related to recent surgery with gas and unorganized fluid which also extends into the left epidural space and neural foramen at L3-4. Disc levels: Interval postoperative changes at L3-4 with left greater than right laminectomies and partial facetectomies. Limited assessment of the spinal canal due to postoperative changes and metallic streak artifact. There is mild-to-moderate right neural foraminal stenosis at L3-4 due to disc bulging and spurring, unchanged. Mild disc bulging elsewhere throughout the lumbar spine is similar to the recent myelogram. IMPRESSION: 1. Interval postoperative changes from recent L3-4 decompression  and fusion as above. 2. No evidence of acute osseous abnormality. Electronically Signed   By: Logan Bores M.D.   On: 10/14/2016 12:30      Lab Results  Component Value Date   HGBA1C 5.9 (H) 10/08/2016   HGBA1C 5.8 08/06/2015   HGBA1C 6.0 08/09/2014   Lab Results  Component Value Date   MICROALBUR 1.3 08/09/2014   LDLCALC 122 (H) 08/06/2015   CREATININE 0.89 10/16/2016       Scheduled Meds: . calcium-vitamin D  1 tablet Oral BID  . docusate sodium  100 mg Oral BID  . donepezil  10 mg Oral QHS  . gabapentin  300 mg Oral TID  . hydrocortisone sod succinate (SOLU-CORTEF) inj  25 mg Intravenous Q12H  . memantine  10 mg Oral BID  . multivitamin with minerals  1 tablet Oral Daily  . sodium chloride flush  3 mL Intravenous Q12H  . vitamin C  500 mg Oral QHS   Continuous Infusions: . sodium chloride    . 0.9 % NaCl with KCl 20 mEq / L Stopped (10/14/16 2340)     LOS: 3 days    Time spent: >30 MINS    [facility]  [facility] Pager [phone]. If 7PM-7AM, please contact night-coverage at [email or web address], password [facility] 10/16/2016, 8:44 AM  LOS: 3 days

## 2016-10-16 NOTE — Progress Notes (Addendum)
Subjective: 3 Days Post-Op Procedure(s) (LRB): LEFT SIDED LUMBAR 3-4 TRANSFORAMINAL LUMBAR INTEBODY FUSION WITH INSTRUMENTATION AND ALLOGRAFT (Left) Patient reports pain as moderate. Denies leg pain. Positive BM. Taking by mouth and voiding okay. Has been getting up independently to the bathroom. No chest pain, shortness of breath, or dizziness.  Objective: Vital signs in last 24 hours: Temp:  [98.3 F (36.8 C)-100.4 F (38 C)] 98.3 F (36.8 C) (02/10 0548) Pulse Rate:  [79-84] 84 (02/10 0548) Resp:  [18-20] 20 (02/10 0548) BP: (84-137)/(34-69) 137/69 (02/10 0548) SpO2:  [94 %-100 %] 99 % (02/10 0548)  Intake/Output from previous day: No intake/output data recorded. Intake/Output this shift: No intake/output data recorded.   Recent Labs  10/13/16 2049 10/15/16 0221 10/16/16 0522  HGB 11.7* 9.7* 9.1*    Recent Labs  10/15/16 0221 10/16/16 0522  WBC 10.1 9.2  RBC 2.82* 2.62*  HCT 28.6* 26.2*  PLT 96* 99*    Recent Labs  10/15/16 0221 10/16/16 0522  NA 139 136  K 4.3 4.3  CL 105 99*  CO2 26 27  BUN 15 12  CREATININE 1.06 0.89  GLUCOSE 92 154*  CALCIUM 8.8* 8.8*    Recent Labs  10/13/16 0953  INR 1.23   Lumbar spine exam: Lumbar back brace is intact. Dressing is benign. Bilateral calves are soft and nontender. No motor weakness in either lower extremity. Patient appears comfortable.   Assessment/Plan: 3 Days Post-Op Procedure(s) (LRB): LEFT SIDED LUMBAR 3-4 TRANSFORAMINAL LUMBAR INTEBODY FUSION WITH INSTRUMENTATION AND ALLOGRAFT (Left) Hypotension yesterday. Now improved. Plan: Okay to discharge home once cleared by hospitalists. Wear back brace when out of bed. I spoke with the patient's son who was at the bedside and he is very comfortable with this plan.  we will resume preoperative Coumadin. Rx for Percocet as needed for pain and Valium as needed for spasm. Follow-up with Dr. Lynann Bologna in 10-14 days.   Onnie Hatchel G 10/16/2016, 8:51 AM

## 2016-10-16 NOTE — Progress Notes (Signed)
Physical Therapy Treatment Patient Details Name: Tanner Hensley MRN: [id number] DOB: March 28, 1941 Today's Date: 10/16/2016    History of Present Illness Tanner Hensley is a 76 y.o. male who presents with ongoing pain in the left leg, severe. Left-sided L3-4 transforaminal lumbar interbody fusion. Right-sided L3-4 posterolateral fusion.    PT Comments    Pt able to tolerate longer treatment today, c/o feeling sleepy but not a limiting factor. No increased pain with mobility or stairs. Able to recall 3/3 back precautions and expressed verbal understanding of donning back brace during teaching session by [employer]. Encouraged pt to participate as much as possible to reduce burden level on wife. Educated son on pt needing supervision after d/c for mobility and getting OOB in order to reduce fall risk. Pt and son in agreement with proper safety precautions once returning home. Pt expected to d/c today with family.    Follow Up Recommendations  Home health PT;Supervision for mobility/OOB     Equipment Recommendations  None recommended by PT    Recommendations for Other Services       Precautions / Restrictions Precautions Precautions: Back Precaution Comments: pt able to recall 3/3 precautions Required Braces or Orthoses: Spinal Brace Spinal Brace: Thoracolumbosacral orthotic;Applied in sitting position Restrictions Weight Bearing Restrictions: No    Mobility  Bed Mobility Overal bed mobility: Needs Assistance Bed Mobility: Rolling;Sidelying to Sit Rolling: Supervision Sidelying to sit: Supervision       General bed mobility comments: Pt able to perform bed mobility with no cuing. Supervision for safety to make sure pt adheres to back precautions.   Transfers Overall transfer level: Needs assistance Equipment used: Rolling walker (2 wheeled) Transfers: Sit to/from [facility] Sit to Stand: Min guard Stand pivot transfers: Min guard       General transfer  comment: Min guard for safety during transfers. Pt requires cuing for upright posture and to keep RW close to prevent LOB.   Ambulation/Gait Ambulation/Gait assistance: Min guard Ambulation Distance (Feet): 180 Feet Assistive device: Rolling walker (2 wheeled) Gait Pattern/deviations: Decreased stride length;Step-through pattern;Trunk flexed;Narrow base of support Gait velocity: slow   General Gait Details: Pt naturally falls into flexed position for comfort, requiring cuing throughout treatment for upright posture; cuing for increased step length and to stay within boundaries of RW.    Stairs Stairs: Yes   Stair Management: One rail Right Number of Stairs: 2 General stair comments: min guard for safety due to episodes of decreased BP. Minimal cuing needed for hand placement   Wheelchair Mobility    Modified Rankin (Stroke Patients Only)       Balance Overall balance assessment: Needs assistance   Sitting balance-Leahy Scale: Good Sitting balance - Comments: cues for upright posture but able to sit EOB with no support   Standing balance support: Bilateral upper extremity supported;During functional activity Standing balance-Leahy Scale: Fair Standing balance comment: able to stand but requires RW for support                    Cognition Arousal/Alertness: Awake/alert Behavior During Therapy: WFL for tasks assessed/performed Overall Cognitive Status: Within Functional Limits for tasks assessed                      Exercises      General Comments        Pertinent Vitals/Pain Pain Assessment: No/denies pain    Home Living  Prior Function            PT Goals (current goals can now be found in the care plan section) Acute Rehab PT Goals Patient Stated Goal: to go home PT Goal Formulation: With patient/family Potential to Achieve Goals: Good Progress towards PT goals: Progressing toward goals    Frequency     Min 5X/week      PT Plan Current plan remains appropriate    Co-evaluation             End of Session Equipment Utilized During Treatment: Gait belt;Back brace Activity Tolerance: Patient tolerated treatment well Patient left: with call bell/phone within reach;with family/visitor present;in bed     Time: [id number] PT Time Calculation (min) (ACUTE ONLY): 25 min  Charges:  $Gait Training: 8-22 mins $Therapeutic Activity: 8-22 mins                    G Codes:      [facility] November 02, 2016, 1:27 PM  Olena [place], [place] Pager [phone]

## 2016-10-16 NOTE — Progress Notes (Signed)
Pt discharged from hospital to home accompanied by wife and family member. Pt instructed on self care at home. Pt stated understanding of instructions.

## 2016-10-19 ENCOUNTER — Ambulatory Visit (INDEPENDENT_AMBULATORY_CARE_PROVIDER_SITE_OTHER): Payer: Medicare Other | Admitting: Family Medicine

## 2016-10-19 ENCOUNTER — Encounter: Payer: Self-pay | Admitting: Family Medicine

## 2016-10-19 VITALS — BP 158/92 | HR 101 | Temp 98.3°F

## 2016-10-19 DIAGNOSIS — I4891 Unspecified atrial fibrillation: Secondary | ICD-10-CM | POA: Diagnosis not present

## 2016-10-19 DIAGNOSIS — I10 Essential (primary) hypertension: Secondary | ICD-10-CM

## 2016-10-19 DIAGNOSIS — R29898 Other symptoms and signs involving the musculoskeletal system: Secondary | ICD-10-CM

## 2016-10-19 DIAGNOSIS — M5416 Radiculopathy, lumbar region: Secondary | ICD-10-CM | POA: Diagnosis not present

## 2016-10-19 NOTE — Progress Notes (Signed)
Pre visit review using our clinic review tool, if applicable. No additional management support is needed unless otherwise documented below in the visit note. 

## 2016-10-19 NOTE — Progress Notes (Signed)
   Subjective:    Patient ID: Tanner Hensley, male    DOB: [birth year], 76 y.o.   MRN: [id number]  HPI Here to follow up a spinal fusion surgery on 10-13-16 per Dr. Lynann Bologna. He performed a fusion at L3-4. The surgery went well and he has been slowly improving. He went home on 10-18-16, and his wife has been helping him. His pain has improved quite a bit, and now he is using Tylenol only for pain control. His bowels are moving well and his appetite has picked up. He had significant hypotension in the hospital and he was taken off all BP meds except low dose Metoprolol. He is current;y taking 25 mg 1/2 tab bid.  He had a borderline low serum cortisol level so he was started on a taper of hydrocortisone. This taper ends today and they want to know if he needs any more. He is back on Coumadin now. He is walking a bit with his walker but only with assistance. His BP has come bck up at home, and this past week he has averaged in the 150s over 80s. His pulse rate averages in the 90s. No chest pain or SOB.   Review of Systems  Constitutional: Negative.   Respiratory: Negative.   Cardiovascular: Negative.   Gastrointestinal: Negative.   Genitourinary: Negative.   Musculoskeletal: Positive for back pain.  Neurological: Negative.        Objective:   Physical Exam  Constitutional: He is oriented to person, place, and time.  In a wheelchair,  wearing a back brace   Cardiovascular: Regular rhythm, normal heart sounds and intact distal pulses.   Rapid rate   Pulmonary/Chest: Effort normal and breath sounds normal. No respiratory distress. He has no wheezes. He has no rales.  Musculoskeletal:  The incision site looks clean, steri strips are in place   Neurological: He is alert and oriented to person, place, and time.          Assessment & Plan:  He is recovering well from spinal fusion surgery. He will follow up with Dr. Lynann Bologna on 10-27-16 and he will decide at that time when to begin PT. For the  BP we will increase the Metoprolol to a full tablet of 25 mg bid. They will monitor the BP and pulse at home and let us know what happens. He can stop the hydrocortisone at this time.  Alysia Penna, MD

## 2016-10-20 DIAGNOSIS — Z7901 Long term (current) use of anticoagulants: Secondary | ICD-10-CM | POA: Diagnosis not present

## 2016-10-20 DIAGNOSIS — M1991 Primary osteoarthritis, unspecified site: Secondary | ICD-10-CM | POA: Diagnosis not present

## 2016-10-20 DIAGNOSIS — Z4789 Encounter for other orthopedic aftercare: Secondary | ICD-10-CM | POA: Diagnosis not present

## 2016-10-20 DIAGNOSIS — I110 Hypertensive heart disease with heart failure: Secondary | ICD-10-CM | POA: Diagnosis not present

## 2016-10-20 DIAGNOSIS — E119 Type 2 diabetes mellitus without complications: Secondary | ICD-10-CM | POA: Diagnosis not present

## 2016-10-20 DIAGNOSIS — Z981 Arthrodesis status: Secondary | ICD-10-CM | POA: Diagnosis not present

## 2016-10-20 DIAGNOSIS — Z950 Presence of cardiac pacemaker: Secondary | ICD-10-CM | POA: Diagnosis not present

## 2016-10-20 DIAGNOSIS — I951 Orthostatic hypotension: Secondary | ICD-10-CM | POA: Diagnosis not present

## 2016-10-20 DIAGNOSIS — I509 Heart failure, unspecified: Secondary | ICD-10-CM | POA: Diagnosis not present

## 2016-10-20 DIAGNOSIS — I2510 Atherosclerotic heart disease of native coronary artery without angina pectoris: Secondary | ICD-10-CM | POA: Diagnosis not present

## 2016-10-20 DIAGNOSIS — I482 Chronic atrial fibrillation: Secondary | ICD-10-CM | POA: Diagnosis not present

## 2016-10-20 DIAGNOSIS — Z955 Presence of coronary angioplasty implant and graft: Secondary | ICD-10-CM | POA: Diagnosis not present

## 2016-10-20 DIAGNOSIS — I441 Atrioventricular block, second degree: Secondary | ICD-10-CM | POA: Diagnosis not present

## 2016-10-20 LAB — CULTURE, BLOOD (ROUTINE X 2)
Culture: NO GROWTH
Culture: NO GROWTH

## 2016-10-22 ENCOUNTER — Telehealth: Payer: Self-pay | Admitting: Family Medicine

## 2016-10-22 MED ORDER — METOPROLOL TARTRATE 25 MG PO TABS: 25.0000 mg | ORAL_TABLET | Freq: Two times a day (BID) | ORAL | 0 refills | Status: DC

## 2016-10-22 NOTE — Telephone Encounter (Signed)
Tell him to start back on the Lisinopril 40 mg daily as before

## 2016-10-22 NOTE — Telephone Encounter (Signed)
Spoke with pt's wife and advised. She would also like to know if he needs to continue the Gabapentin. She reports that pt states he has not seen any improvement in symptoms with medication and he has been taking for 8 weeks. He would like to know if ok to discontinue.   Advised wife that I would forward message to Dr. Sarajane Jews and we would call them on Monday.  Dr. Sarajane Jews - Please advise. Thanks!

## 2016-10-22 NOTE — Telephone Encounter (Signed)
I left a voice message on home phone with advice. I will try again to reach pt.

## 2016-10-22 NOTE — Telephone Encounter (Signed)
Pt's wife has called with updated BP readings.   Dr. Sarajane Jews - Please advise as to any medication changes. Thanks!

## 2016-10-22 NOTE — Telephone Encounter (Signed)
Patient Name: Tanner Hensley  DOB: [birth year]    Initial Comment Caller states her husband was seen Tuesday for followup, was asked to record blood pressure reading and call back as he has been off medications   Nurse Assessment  Nurse: Raphael Gibney, RN, Vanita Ingles Date/Time (Eastern Time): 10/22/2016 11:31:22 AM  Confirm and document reason for call. If symptomatic, describe symptoms. ---Caller states spouse was seen on Tuesday for post discharge visit after surgery. The hospital took him off his BP medication. Dr. Sarajane Jews did not restart his BP medication. No chest pain. Pt is doing well. Dr. Sarajane Jews wanted spouse to call back today with blood pressure readings so that he can adjust his medications. BP 2/13 7:30 pm 156/93 with pulse of 90; 2/14 3 am 164/92 P 89; 3:10 pm 150/86 P 80; 3:40 pm 140/80 P 82; 2/15 10 am 183/97; P 89; 10:15 am 160/92; 11:15 am 163/94; P 80; 3:15 pm 138/83 P 80; 7 pm 143/82; P 92; 2/16 2 am; 156/89 P 86; 3:30 am 136/76 P 91; 9:45 am 171/92; P 82; 9:50 am 166/97 P 87; 10:50 am 148/79; P 80.  Does the patient have any new or worsening symptoms? ---No  Please document clinical information provided and list any resource used. ---Advised caller that note would be sent to Dr. Sarajane Jews and she should receive call back regarding his medication. Verbalized understanding.     Guidelines    Guideline Title Affirmed Question Affirmed Notes       Final Disposition User   Clinical Call [place], RN, Vera    Comments  Pt wants to know if he needs to continue his gabapentin. Please call pt back regarding medication.

## 2016-10-23 NOTE — Telephone Encounter (Signed)
Yes it is okay to stop the Gabapentin

## 2016-10-25 MED ORDER — DONEPEZIL HCL 10 MG PO TABS: 10.0000 mg | ORAL_TABLET | Freq: Every day | ORAL | 1 refills | Status: DC

## 2016-10-25 NOTE — Telephone Encounter (Signed)
I spoke with pt and went over information. Pt also requested a refill on Aricept, per Dr. Sarajane Jews okay to refill and I did send to mail order.

## 2016-10-26 DIAGNOSIS — I509 Heart failure, unspecified: Secondary | ICD-10-CM | POA: Diagnosis not present

## 2016-10-26 DIAGNOSIS — E119 Type 2 diabetes mellitus without complications: Secondary | ICD-10-CM | POA: Diagnosis not present

## 2016-10-26 DIAGNOSIS — I110 Hypertensive heart disease with heart failure: Secondary | ICD-10-CM | POA: Diagnosis not present

## 2016-10-26 DIAGNOSIS — Z7901 Long term (current) use of anticoagulants: Secondary | ICD-10-CM | POA: Diagnosis not present

## 2016-10-26 DIAGNOSIS — I951 Orthostatic hypotension: Secondary | ICD-10-CM | POA: Diagnosis not present

## 2016-10-26 DIAGNOSIS — I2510 Atherosclerotic heart disease of native coronary artery without angina pectoris: Secondary | ICD-10-CM | POA: Diagnosis not present

## 2016-10-26 DIAGNOSIS — M1991 Primary osteoarthritis, unspecified site: Secondary | ICD-10-CM | POA: Diagnosis not present

## 2016-10-26 DIAGNOSIS — I482 Chronic atrial fibrillation: Secondary | ICD-10-CM | POA: Diagnosis not present

## 2016-10-26 DIAGNOSIS — Z955 Presence of coronary angioplasty implant and graft: Secondary | ICD-10-CM | POA: Diagnosis not present

## 2016-10-26 DIAGNOSIS — Z950 Presence of cardiac pacemaker: Secondary | ICD-10-CM | POA: Diagnosis not present

## 2016-10-26 DIAGNOSIS — Z4789 Encounter for other orthopedic aftercare: Secondary | ICD-10-CM | POA: Diagnosis not present

## 2016-10-26 DIAGNOSIS — I441 Atrioventricular block, second degree: Secondary | ICD-10-CM | POA: Diagnosis not present

## 2016-10-26 DIAGNOSIS — Z981 Arthrodesis status: Secondary | ICD-10-CM | POA: Diagnosis not present

## 2016-10-27 DIAGNOSIS — Z9889 Other specified postprocedural states: Secondary | ICD-10-CM | POA: Diagnosis not present

## 2016-10-28 DIAGNOSIS — Z955 Presence of coronary angioplasty implant and graft: Secondary | ICD-10-CM | POA: Diagnosis not present

## 2016-10-28 DIAGNOSIS — M1991 Primary osteoarthritis, unspecified site: Secondary | ICD-10-CM | POA: Diagnosis not present

## 2016-10-28 DIAGNOSIS — I2510 Atherosclerotic heart disease of native coronary artery without angina pectoris: Secondary | ICD-10-CM | POA: Diagnosis not present

## 2016-10-28 DIAGNOSIS — Z981 Arthrodesis status: Secondary | ICD-10-CM | POA: Diagnosis not present

## 2016-10-28 DIAGNOSIS — Z7901 Long term (current) use of anticoagulants: Secondary | ICD-10-CM | POA: Diagnosis not present

## 2016-10-28 DIAGNOSIS — E119 Type 2 diabetes mellitus without complications: Secondary | ICD-10-CM | POA: Diagnosis not present

## 2016-10-28 DIAGNOSIS — I110 Hypertensive heart disease with heart failure: Secondary | ICD-10-CM | POA: Diagnosis not present

## 2016-10-28 DIAGNOSIS — I441 Atrioventricular block, second degree: Secondary | ICD-10-CM | POA: Diagnosis not present

## 2016-10-28 DIAGNOSIS — Z4789 Encounter for other orthopedic aftercare: Secondary | ICD-10-CM | POA: Diagnosis not present

## 2016-10-28 DIAGNOSIS — Z950 Presence of cardiac pacemaker: Secondary | ICD-10-CM | POA: Diagnosis not present

## 2016-10-28 DIAGNOSIS — I951 Orthostatic hypotension: Secondary | ICD-10-CM | POA: Diagnosis not present

## 2016-10-28 DIAGNOSIS — I482 Chronic atrial fibrillation: Secondary | ICD-10-CM | POA: Diagnosis not present

## 2016-10-28 DIAGNOSIS — I509 Heart failure, unspecified: Secondary | ICD-10-CM | POA: Diagnosis not present

## 2016-11-02 DIAGNOSIS — Z7901 Long term (current) use of anticoagulants: Secondary | ICD-10-CM | POA: Diagnosis not present

## 2016-11-02 DIAGNOSIS — I110 Hypertensive heart disease with heart failure: Secondary | ICD-10-CM | POA: Diagnosis not present

## 2016-11-02 DIAGNOSIS — M1991 Primary osteoarthritis, unspecified site: Secondary | ICD-10-CM | POA: Diagnosis not present

## 2016-11-02 DIAGNOSIS — Z950 Presence of cardiac pacemaker: Secondary | ICD-10-CM | POA: Diagnosis not present

## 2016-11-02 DIAGNOSIS — I482 Chronic atrial fibrillation: Secondary | ICD-10-CM | POA: Diagnosis not present

## 2016-11-02 DIAGNOSIS — I951 Orthostatic hypotension: Secondary | ICD-10-CM | POA: Diagnosis not present

## 2016-11-02 DIAGNOSIS — I509 Heart failure, unspecified: Secondary | ICD-10-CM | POA: Diagnosis not present

## 2016-11-02 DIAGNOSIS — E119 Type 2 diabetes mellitus without complications: Secondary | ICD-10-CM | POA: Diagnosis not present

## 2016-11-02 DIAGNOSIS — Z4789 Encounter for other orthopedic aftercare: Secondary | ICD-10-CM | POA: Diagnosis not present

## 2016-11-02 DIAGNOSIS — I2510 Atherosclerotic heart disease of native coronary artery without angina pectoris: Secondary | ICD-10-CM | POA: Diagnosis not present

## 2016-11-02 DIAGNOSIS — Z981 Arthrodesis status: Secondary | ICD-10-CM | POA: Diagnosis not present

## 2016-11-02 DIAGNOSIS — I441 Atrioventricular block, second degree: Secondary | ICD-10-CM | POA: Diagnosis not present

## 2016-11-02 DIAGNOSIS — Z955 Presence of coronary angioplasty implant and graft: Secondary | ICD-10-CM | POA: Diagnosis not present

## 2016-11-05 DIAGNOSIS — E119 Type 2 diabetes mellitus without complications: Secondary | ICD-10-CM | POA: Diagnosis not present

## 2016-11-05 DIAGNOSIS — I482 Chronic atrial fibrillation: Secondary | ICD-10-CM | POA: Diagnosis not present

## 2016-11-05 DIAGNOSIS — Z4789 Encounter for other orthopedic aftercare: Secondary | ICD-10-CM | POA: Diagnosis not present

## 2016-11-05 DIAGNOSIS — Z950 Presence of cardiac pacemaker: Secondary | ICD-10-CM | POA: Diagnosis not present

## 2016-11-05 DIAGNOSIS — I951 Orthostatic hypotension: Secondary | ICD-10-CM | POA: Diagnosis not present

## 2016-11-05 DIAGNOSIS — Z981 Arthrodesis status: Secondary | ICD-10-CM | POA: Diagnosis not present

## 2016-11-05 DIAGNOSIS — Z955 Presence of coronary angioplasty implant and graft: Secondary | ICD-10-CM | POA: Diagnosis not present

## 2016-11-05 DIAGNOSIS — I509 Heart failure, unspecified: Secondary | ICD-10-CM | POA: Diagnosis not present

## 2016-11-05 DIAGNOSIS — I110 Hypertensive heart disease with heart failure: Secondary | ICD-10-CM | POA: Diagnosis not present

## 2016-11-05 DIAGNOSIS — Z7901 Long term (current) use of anticoagulants: Secondary | ICD-10-CM | POA: Diagnosis not present

## 2016-11-05 DIAGNOSIS — I441 Atrioventricular block, second degree: Secondary | ICD-10-CM | POA: Diagnosis not present

## 2016-11-05 DIAGNOSIS — I2510 Atherosclerotic heart disease of native coronary artery without angina pectoris: Secondary | ICD-10-CM | POA: Diagnosis not present

## 2016-11-05 DIAGNOSIS — M1991 Primary osteoarthritis, unspecified site: Secondary | ICD-10-CM | POA: Diagnosis not present

## 2016-11-09 NOTE — Discharge Summary (Signed)
Patient ID: Tanner Hensley MRN: [id number] DOB/AGE: 11-18-1940 76 y.o.  Admit date: 10/13/2016 Discharge date: 10/16/2016  Admission Diagnoses:  Active Problems:   Essential hypertension   ATRIAL FIBRILLATION, CHRONIC   Radiculopathy   Syncope   CAD (coronary artery disease)   Lower extremity weakness   Discharge Diagnoses:  Same  Past Medical History:  Diagnosis Date  . Arthritis    sees Dr. Charlestine Night  . Atrial fibrillation ([place])    (Dr. Phylliss Blakes at [facility] and Dr. Lovena Le at [facility]. [facility]es [facility] polymyalgia rheumatica (Dr. Charlestine Night)  . CHF (congestive heart failure) (HCC)    NOT SURE WHEN  . Coronary artery disease   . Diabetes mellitus type II    DIET CONTROLLED AND LOST 40 LBS  . Dysrhythmia    chronic a=fib with cardiac ablations  . GERD (gastroesophageal reflux disease)   . Gout   . High cholesterol   . Hypertension   . Insomnia   . Neuromuscular disorder ([place])    POLY MYALGIA   RHEUMATICA  . Polymyalgia rheumatica ([place])    sees Dr. Charlestine Night   . Presence of permanent cardiac pacemaker    [employer]  08/2011  . Urticaria     Surgeries: Procedure(s): LEFT SIDED LUMBAR 3-4 TRANSFORAMINAL LUMBAR INTEBODY FUSION WITH INSTRUMENTATION AND ALLOGRAFT on 10/13/2016   Consultants: Treatment Team:  Marcheta Grammes, MD, Neurology   Discharged Condition: Improved  Hospital Course: Tanner Hensley is an 76 y.o. male who was admitted 10/13/2016 for operative treatment of radiculopathy. Patient has severe unremitting pain that affects sleep, daily activities, and work/hobbies. After pre-op clearance the patient was taken to the operating room on 10/13/2016 and underwent  Procedure(s): LEFT SIDED LUMBAR 3-4 TRANSFORAMINAL LUMBAR INTEBODY FUSION WITH INSTRUMENTATION AND ALLOGRAFT.    Patient was given perioperative antibiotics:  Anti-infectives    Start     Dose/Rate Route Frequency Ordered Stop   10/13/16 1830  ceFAZolin (ANCEF) IVPB 2g/100 mL  premix     2 g 200 mL/hr over 30 Minutes Intravenous Every 8 hours 10/13/16 1825 10/14/16 0404   10/13/16 0914  ceFAZolin (ANCEF) IVPB 2g/100 mL premix     2 g 200 mL/hr over 30 Minutes Intravenous On call to O.R. 10/13/16 0914 10/13/16 1237       Patient was given sequential compression devices, early ambulation to prevent DVT.  Patient benefited maximally from hospital stay and there were complications of hypotension resulting in syncope. Pt worked up and cleared by neurology and PCP.    Recent vital signs: BP 122/81 (BP Location: Left Arm)   Pulse 81   Temp 99.5 F (37.5 C) (Oral)   Resp 20   Ht 5\' 6"  (1.676 m)   Wt 77 kg (169 lb 12.1 oz)   SpO2 100%   BMI 27.40 kg/m    Discharge Medications:   Allergies as of 10/16/2016      Reactions   Codeine Rash, Other (See Comments)   Dizziness, low blood pressure and heart rate   Hydrocodone-acetaminophen Rash, Other (See Comments)   Dizziness, low blood pressure and heart rate   Tramadol Nausea And Vomiting   Lipitor [atorvastatin] Other (See Comments)   Myalgias   Robaxin [methocarbamol] Other (See Comments)   Dropped blood pressure too much   Zocor [simvastatin] Other (See Comments)   myalgias      Medication List    STOP taking these medications   lisinopril 40 MG tablet Commonly known as:  PRINIVIL,ZESTRIL   metoprolol tartrate 25 MG tablet Commonly known as:  LOPRESSOR     TAKE these medications   acetaminophen 500 MG tablet Commonly known as:  TYLENOL Take 1,000 mg by mouth 3 (three) times daily.   alendronate 70 MG tablet Commonly known as:  FOSAMAX TAKE 1 TABLET BY MOUTH  EVERY 7 DAYS. TAKE WITH  FULL GLASS OF WATER   Ascorbic Acid 500 MG Caps Take 500 mg by mouth at bedtime.   calcium citrate-vitamin D 315-200 MG-UNIT tablet Commonly known as:  CITRACAL+D Take 1 tablet by mouth 2 (two) times daily.   HYDROmorphone 2 MG tablet Commonly known as:  DILAUDID Take 1 mg by mouth every 4 (four) hours as  needed for severe pain.   memantine 10 MG tablet Commonly known as:  NAMENDA TAKE 1 TABLET BY MOUTH TWO  TIMES DAILY   multivitamin with minerals Tabs tablet Take 1 tablet by mouth daily. SENIOR MULTIVITAMIN   senna 8.6 MG tablet Commonly known as:  SENOKOT Take 1 tablet by mouth.   warfarin 5 MG tablet Commonly known as:  COUMADIN Take 5 mg by mouth daily.     ASK your doctor about these medications   hydrocortisone 10 MG tablet Commonly known as:  CORTEF 20 mg in the morning, 10 in the evening Ask about: Should I take this medication?       Diagnostic Studies: Ct Angio Head W Or Wo Contrast  Result Date: 10/14/2016 CLINICAL DATA:  Lower extremity weakness and syncopal episode. EXAM: CT ANGIOGRAPHY HEAD AND NECK TECHNIQUE: Multidetector CT imaging of the head and neck was performed using the standard protocol during bolus administration of intravenous contrast. Multiplanar CT image reconstructions and MIPs were obtained to evaluate the vascular anatomy. Carotid stenosis measurements (when applicable) are obtained utilizing NASCET criteria, using the distal internal carotid diameter as the denominator. CONTRAST:  50 mL Isovue 370 COMPARISON:  Noncontrast head CT 10/13/2016 FINDINGS: CTA NECK FINDINGS Aortic arch: Normal variant aortic arch branching with common origin of the brachiocephalic and left common carotid arteries. There is aneurysmal dilatation of the aortic arch, incompletely imaged and more fully evaluated on concurrent chest CTA. Moderate predominantly calcified plaque in the aortic arch. Mild narrowing of the right subclavian artery near its origin. Right carotid system: Mild scattered calcified and soft plaque in the common carotid artery without stenosis. Moderate calcified plaque at the carotid bifurcation and in the proximal ICA without significant stenosis. Tortuous and ectatic distal cervical ICA. Left carotid system: Common carotid artery demonstrates mild  atherosclerotic luminal irregularity without stenosis. There is moderate predominantly calcified plaque at the carotid bifurcation and in the proximal ICA without significant stenosis. Tortuous and ectatic distal cervical ICA. Vertebral arteries: Patent with the right being moderately to strongly dominant. Calcified plaque at the right vertebral artery origin results in moderate stenosis. Skeleton: Advanced disc degeneration at C3-4 and C4-5. Advanced left facet arthrosis at C2-3. Other neck: No mass or lymph node enlargement. Upper chest: More fully evaluated on concurrent chest CTA. Review of the MIP images confirms the above findings CTA HEAD FINDINGS Anterior circulation: The internal carotid arteries are patent from the skullbase to carotid termini with moderately advanced siphon calcified atherosclerosis bilaterally resulting in mild ophthalmic segment/ proximal supraclinoid stenosis bilaterally. ACAs and MCAs are patent without evidence of major branch occlusion. There is mild left M1 stenosis, and there is moderate branch vessel atherosclerotic irregularity and narrowing throughout the anterior circulation. No aneurysm. Posterior circulation: Intracranial vertebral arteries  are patent to the basilar with the right being dominant. PICA and SCA origins are patent. Basilar artery is patent with mild luminal irregularity but no stenosis. There is a fetal origin of the left PCA. There is a severe right PCA stenosis near the P1-P2 junction, with diffuse irregularity and up to moderate stenosis involving the right P2 segment. There is also a moderate to severe distal left P2 stenosis, and there is moderate disease affecting the P3 segments and more distal branch vessels bilaterally. No aneurysm. Venous sinuses: Patent. Anatomic variants: None. Delayed phase: No abnormal enhancement. Review of the MIP images confirms the above findings IMPRESSION: 1. No large vessel occlusion. 2. Moderately advanced intracranial  atherosclerosis. Mild bilateral ICA stenosis. 3. Severe proximal right PCA stenosis. 4. Moderate bilateral cervical carotid artery atherosclerosis without stenosis. 5. Moderate proximal right vertebral artery stenosis. 6. Thoracic aortic aneurysm.  See separate chest CTA. Electronically Signed   By: Logan Bores M.D.   On: 10/14/2016 12:07   Dg Lumbar Spine 2-3 Views  Result Date: 10/13/2016 CLINICAL DATA:  L3-4 TLIF EXAM: DG C-ARM 61-120 MIN; LUMBAR SPINE - 2-3 VIEW COMPARISON:  None. FINDINGS: 45 seconds of fluoroscopic time utilized for talar half of the elbow 3 4 interspace. Hardware noted within the pedicles of L3 and L4 without breech of vertebral endplates. Interbody disc noted at L3-4. IMPRESSION: L3-4 transforaminal lumbar interbody fusion. Electronically Signed   By: Ashley Royalty M.D.   On: 10/13/2016 17:58   Ct Head Wo Contrast  Result Date: 10/13/2016 CLINICAL DATA:  Bilateral lower extremity weakness EXAM: CT HEAD WITHOUT CONTRAST TECHNIQUE: Contiguous axial images were obtained from the base of the skull through the vertex without intravenous contrast. COMPARISON:  Head CT 02/21/2014 FINDINGS: Brain: No mass lesion, intraparenchymal hemorrhage or extra-axial collection. No evidence of acute cortical infarct. There is confluent periventricular hypoattenuation compatible with chronic microvascular disease. Vascular: No hyperdense vessel or unexpected calcification. Skull: Normal visualized skull base, calvarium and extracranial soft tissues. Sinuses/Orbits: No sinus fluid levels or advanced mucosal thickening. No mastoid effusion. Normal orbits. IMPRESSION: 1. No acute intracranial abnormality. 2. Advanced chronic microvascular ischemia. Electronically Signed   By: Ulyses Jarred M.D.   On: 10/13/2016 22:02   Ct Angio Neck W Or Wo Contrast  Result Date: 10/14/2016 CLINICAL DATA:  Lower extremity weakness and syncopal episode. EXAM: CT ANGIOGRAPHY HEAD AND NECK TECHNIQUE: Multidetector CT imaging  of the head and neck was performed using the standard protocol during bolus administration of intravenous contrast. Multiplanar CT image reconstructions and MIPs were obtained to evaluate the vascular anatomy. Carotid stenosis measurements (when applicable) are obtained utilizing NASCET criteria, using the distal internal carotid diameter as the denominator. CONTRAST:  50 mL Isovue 370 COMPARISON:  Noncontrast head CT 10/13/2016 FINDINGS: CTA NECK FINDINGS Aortic arch: Normal variant aortic arch branching with common origin of the brachiocephalic and left common carotid arteries. There is aneurysmal dilatation of the aortic arch, incompletely imaged and more fully evaluated on concurrent chest CTA. Moderate predominantly calcified plaque in the aortic arch. Mild narrowing of the right subclavian artery near its origin. Right carotid system: Mild scattered calcified and soft plaque in the common carotid artery without stenosis. Moderate calcified plaque at the carotid bifurcation and in the proximal ICA without significant stenosis. Tortuous and ectatic distal cervical ICA. Left carotid system: Common carotid artery demonstrates mild atherosclerotic luminal irregularity without stenosis. There is moderate predominantly calcified plaque at the carotid bifurcation and in the proximal ICA without significant stenosis.  Tortuous and ectatic distal cervical ICA. Vertebral arteries: Patent with the right being moderately to strongly dominant. Calcified plaque at the right vertebral artery origin results in moderate stenosis. Skeleton: Advanced disc degeneration at C3-4 and C4-5. Advanced left facet arthrosis at C2-3. Other neck: No mass or lymph node enlargement. Upper chest: More fully evaluated on concurrent chest CTA. Review of the MIP images confirms the above findings CTA HEAD FINDINGS Anterior circulation: The internal carotid arteries are patent from the skullbase to carotid termini with moderately advanced siphon  calcified atherosclerosis bilaterally resulting in mild ophthalmic segment/ proximal supraclinoid stenosis bilaterally. ACAs and MCAs are patent without evidence of major branch occlusion. There is mild left M1 stenosis, and there is moderate branch vessel atherosclerotic irregularity and narrowing throughout the anterior circulation. No aneurysm. Posterior circulation: Intracranial vertebral arteries are patent to the basilar with the right being dominant. PICA and SCA origins are patent. Basilar artery is patent with mild luminal irregularity but no stenosis. There is a fetal origin of the left PCA. There is a severe right PCA stenosis near the P1-P2 junction, with diffuse irregularity and up to moderate stenosis involving the right P2 segment. There is also a moderate to severe distal left P2 stenosis, and there is moderate disease affecting the P3 segments and more distal branch vessels bilaterally. No aneurysm. Venous sinuses: Patent. Anatomic variants: None. Delayed phase: No abnormal enhancement. Review of the MIP images confirms the above findings IMPRESSION: 1. No large vessel occlusion. 2. Moderately advanced intracranial atherosclerosis. Mild bilateral ICA stenosis. 3. Severe proximal right PCA stenosis. 4. Moderate bilateral cervical carotid artery atherosclerosis without stenosis. 5. Moderate proximal right vertebral artery stenosis. 6. Thoracic aortic aneurysm.  See separate chest CTA. Electronically Signed   By: Logan Bores M.D.   On: 10/14/2016 12:07   Ct Angio Chest Pe W Or Wo Contrast  Result Date: 10/14/2016 CLINICAL DATA:  Syncopal episode last night. Lower extremity weakness. Strength has improved overnight. 10/13/2016 EXAM: CT ANGIOGRAPHY CHEST WITH CONTRAST TECHNIQUE: Multidetector CT imaging of the chest was performed using the standard protocol during bolus administration of intravenous contrast. Multiplanar CT image reconstructions and MIPs were obtained to evaluate the vascular  anatomy. CONTRAST:  80 cc Isovue 370 COMPARISON:  Chest x-ray 10/13/2016 FINDINGS: Cardiovascular: Coronary stents and coronary artery calcifications are present. The pulmonary arteries are well opacified. There is no acute pulmonary embolus. Left-sided transvenous pacemaker leads to the right atrium and right ventricle. There is atherosclerotic calcification of the mitral annulus. There is atherosclerotic calcification of the thoracic aorta. Ascending aorta is 4.4 cm. Mediastinum/Nodes: Precarinal lymph node is 1.4 cm. Right paratracheal lymph node is 1.3 cm. Right hilar node is 1.3 cm. Left hilar node is 1.6 cm. Small hiatal hernia. Lungs/Pleura: There are focal areas of air trapping. Minimal bibasilar atelectasis. No suspicious pulmonary nodules. No focal consolidations. Trace pleural effusions. Upper Abdomen: Hiatal hernia.  Otherwise unremarkable. Musculoskeletal: Thoracic osteophytes are present. No acute osseous abnormality. Review of the MIP images confirms the above findings. IMPRESSION: 1.  No evidence for acute  abnormality. 2. Coronary artery disease. 3. No evidence for acute pulmonary embolus. 4. Small mediastinal and hilar lymph nodes may be reactive given the history of CHF. 5. Mild mosaic appearance of the lungs consistent with air trapping. Trace pleural effusions. 6. Dilated ascending aorta. Recommend annual imaging followup by CTA or MRA. This recommendation follows 2010 ACCF/AHA/AATS/ACR/ASA/SCA/SCAI/SIR/STS/SVM Guidelines for the Diagnosis and Management of Patients with Thoracic Aortic Disease. Circulation. 2010; 121: [id number] New  Electronically Signed   By: Nolon Nations M.D.   On: 10/14/2016 11:56   Ct Lumbar Spine Wo Contrast  Result Date: 10/14/2016 CLINICAL DATA:  Syncopal episode and lower extremity weakness. Lumbar fusion knee yesterday. EXAM: CT LUMBAR SPINE WITHOUT CONTRAST TECHNIQUE: Multidetector CT imaging of the lumbar spine was performed without intravenous contrast  administration. Multiplanar CT image reconstructions were also generated. COMPARISON:  Lumbar CT myelogram 09/28/2016 FINDINGS: Segmentation: 5 lumbar type vertebrae. Alignment: Slight thoracolumbar dextroscoliosis, unchanged. No listhesis. Vertebrae: Sequelae of interval L3-4 decompression and fusion are identified. Pedicle screws and interconnecting rods are present bilaterally. The left L4 screw terminates just lateral to the vertebral body. An interbody graft is in place. No acute fracture is identified. Vertebral body heights are preserved. Paraspinal and other soft tissues: Postoperative changes in the posterior soft tissues related to recent surgery with gas and unorganized fluid which also extends into the left epidural space and neural foramen at L3-4. Disc levels: Interval postoperative changes at L3-4 with left greater than right laminectomies and partial facetectomies. Limited assessment of the spinal canal due to postoperative changes and metallic streak artifact. There is mild-to-moderate right neural foraminal stenosis at L3-4 due to disc bulging and spurring, unchanged. Mild disc bulging elsewhere throughout the lumbar spine is similar to the recent myelogram. IMPRESSION: 1. Interval postoperative changes from recent L3-4 decompression and fusion as above. 2. No evidence of acute osseous abnormality. Electronically Signed   By: Logan Bores M.D.   On: 10/14/2016 12:30   Dg Lumbar Spine 1 View  Result Date: 10/13/2016 CLINICAL DATA:  L3-4 interbody fusion EXAM: LUMBAR SPINE - 1 VIEW COMPARISON:  Myelogram 09/28/2016 FINDINGS: Initial lateral film shows the lower needle at the spinous process of L3 an the upper needle at the interspinous space of L1-2. IMPRESSION: Spinous process at L3 and interspinous space L1-2 localized. Electronically Signed   By: Nelson Chimes M.D.   On: 10/13/2016 15:10   Dg Chest Port 1 View  Result Date: 10/13/2016 CLINICAL DATA:  Weakness EXAM: PORTABLE CHEST 1 VIEW  COMPARISON:  10/08/2016 FINDINGS: Left-sided pacing device, similar compared to prior. No acute infiltrate or effusion. Stable cardiomediastinal silhouette with atherosclerosis. No pneumothorax. IMPRESSION: No radiographic evidence for acute cardiopulmonary abnormality Electronically Signed   By: Donavan Foil M.D.   On: 10/13/2016 20:42   Dg C-arm 1-60 Min  Result Date: 10/13/2016 CLINICAL DATA:  L3-4 TLIF EXAM: DG C-ARM 61-120 MIN; LUMBAR SPINE - 2-3 VIEW COMPARISON:  None. FINDINGS: 45 seconds of fluoroscopic time utilized for talar half of the elbow 3 4 interspace. Hardware noted within the pedicles of L3 and L4 without breech of vertebral endplates. Interbody disc noted at L3-4. IMPRESSION: L3-4 transforaminal lumbar interbody fusion. Electronically Signed   By: Ashley Royalty M.D.   On: 10/13/2016 17:58    Disposition: 01-Home or Self Care  Discharge Instructions    Call MD / Call 911    Complete by:  As directed    If you experience chest pain or shortness of breath, CALL 911 and be transported to the hospital emergency room.  If you develope a fever above 101 F, pus (white drainage) or increased drainage or redness at the wound, or calf pain, call your surgeon's office.   Constipation Prevention    Complete by:  As directed    Drink plenty of fluids.  Prune juice may be helpful.  You may use a stool softener, such as Colace (over the counter) 100 mg twice a  day.  Use MiraLax (over the counter) for constipation as needed.   Increase activity slowly as tolerated    Complete by:  As directed    Wear back brace when up     POD #3 s/p L4/5 decompression and fusion doing well with resolved pre-op leg pain and well controlled PO LBP.   - OK to D/C home from orthopaedic              - D/C with [facility] - Percocet for pain, Valium for muscle spasms -Written scripts for pain signed and in chart -D/C instructions sheet printed and in chart -D/C today  -F/U in office 2 weeks   Signed: Justice Britain 11/09/2016, 5:05 PM

## 2016-11-12 ENCOUNTER — Other Ambulatory Visit: Payer: Self-pay | Admitting: Family Medicine

## 2016-11-12 ENCOUNTER — Ambulatory Visit (INDEPENDENT_AMBULATORY_CARE_PROVIDER_SITE_OTHER): Payer: Medicare Other | Admitting: Family Medicine

## 2016-11-12 DIAGNOSIS — I4891 Unspecified atrial fibrillation: Secondary | ICD-10-CM | POA: Diagnosis not present

## 2016-11-12 LAB — POCT INR: INR: 2.9

## 2016-11-26 DIAGNOSIS — M5416 Radiculopathy, lumbar region: Secondary | ICD-10-CM | POA: Diagnosis not present

## 2016-11-26 DIAGNOSIS — Z9889 Other specified postprocedural states: Secondary | ICD-10-CM | POA: Diagnosis not present

## 2016-12-28 DIAGNOSIS — M5416 Radiculopathy, lumbar region: Secondary | ICD-10-CM | POA: Diagnosis not present

## 2016-12-31 DIAGNOSIS — M4326 Fusion of spine, lumbar region: Secondary | ICD-10-CM | POA: Diagnosis not present

## 2017-01-03 DIAGNOSIS — Z950 Presence of cardiac pacemaker: Secondary | ICD-10-CM | POA: Diagnosis not present

## 2017-01-04 DIAGNOSIS — M4326 Fusion of spine, lumbar region: Secondary | ICD-10-CM | POA: Diagnosis not present

## 2017-01-06 ENCOUNTER — Ambulatory Visit (INDEPENDENT_AMBULATORY_CARE_PROVIDER_SITE_OTHER): Payer: Medicare Other | Admitting: Family Medicine

## 2017-01-06 ENCOUNTER — Encounter: Payer: Self-pay | Admitting: Family Medicine

## 2017-01-06 VITALS — BP 140/90 | HR 84 | Ht 65.5 in | Wt 163.1 lb

## 2017-01-06 DIAGNOSIS — M353 Polymyalgia rheumatica: Secondary | ICD-10-CM

## 2017-01-06 DIAGNOSIS — I10 Essential (primary) hypertension: Secondary | ICD-10-CM | POA: Diagnosis not present

## 2017-01-06 DIAGNOSIS — R413 Other amnesia: Secondary | ICD-10-CM | POA: Diagnosis not present

## 2017-01-06 DIAGNOSIS — N138 Other obstructive and reflux uropathy: Secondary | ICD-10-CM | POA: Diagnosis not present

## 2017-01-06 DIAGNOSIS — E119 Type 2 diabetes mellitus without complications: Secondary | ICD-10-CM | POA: Diagnosis not present

## 2017-01-06 DIAGNOSIS — N401 Enlarged prostate with lower urinary tract symptoms: Secondary | ICD-10-CM

## 2017-01-06 DIAGNOSIS — I480 Paroxysmal atrial fibrillation: Secondary | ICD-10-CM | POA: Diagnosis not present

## 2017-01-06 LAB — LIPID PANEL
Cholesterol: 197 mg/dL (ref 0–200)
HDL: 48.2 mg/dL (ref >39.00)
LDL Cholesterol: 122 mg/dL — ABNORMAL HIGH (ref 0–99)
NONHDL: 148.38
Total CHOL/HDL Ratio: 4
Triglycerides: 133 mg/dL (ref 0.0–149.0)
VLDL: 26.6 mg/dL (ref 0.0–40.0)

## 2017-01-06 LAB — CBC WITH DIFFERENTIAL/PLATELET
BASOS PCT: 0.7 % (ref 0.0–3.0)
Basophils Absolute: 0 10*3/uL (ref 0.0–0.1)
EOS ABS: 0.1 10*3/uL (ref 0.0–0.7)
Eosinophils Relative: 2.2 % (ref 0.0–5.0)
HCT: 42.3 % (ref 39.0–52.0)
Hemoglobin: 14.3 g/dL (ref 13.0–17.0)
Lymphocytes Relative: 22.3 % (ref 12.0–46.0)
Lymphs Abs: 1.2 10*3/uL (ref 0.7–4.0)
MCHC: 33.8 g/dL (ref 30.0–36.0)
MCV: 104.2 fl — ABNORMAL HIGH (ref 78.0–100.0)
MONO ABS: 0.6 10*3/uL (ref 0.1–1.0)
Monocytes Relative: 11.5 % (ref 3.0–12.0)
NEUTROS ABS: 3.5 10*3/uL (ref 1.4–7.7)
NEUTROS PCT: 63.3 % (ref 43.0–77.0)
PLATELETS: 145 10*3/uL — AB (ref 150.0–400.0)
RBC: 4.06 Mil/uL — ABNORMAL LOW (ref 4.22–5.81)
RDW: 12.5 % (ref 11.5–15.5)
WBC: 5.6 10*3/uL (ref 4.0–10.5)

## 2017-01-06 LAB — POC URINALSYSI DIPSTICK (AUTOMATED)
Bilirubin, UA: NEGATIVE
Glucose, UA: NEGATIVE
KETONES UA: NEGATIVE
LEUKOCYTES UA: NEGATIVE
NITRITE UA: NEGATIVE
PH UA: 6 (ref 5.0–8.0)
PROTEIN UA: NEGATIVE
Spec Grav, UA: <=1.005 — AB (ref 1.010–1.025)
UROBILINOGEN UA: 0.2 U/dL

## 2017-01-06 LAB — HEPATIC FUNCTION PANEL
ALT: 19 U/L (ref 0–53)
AST: 31 U/L (ref 0–37)
Albumin: 4.2 g/dL (ref 3.5–5.2)
Alkaline Phosphatase: 87 U/L (ref 39–117)
BILIRUBIN TOTAL: 0.7 mg/dL (ref 0.2–1.2)
Bilirubin, Direct: 0.1 mg/dL (ref 0.0–0.3)
Total Protein: 6.5 g/dL (ref 6.0–8.3)

## 2017-01-06 LAB — PSA: PSA: 1.34 ng/mL (ref 0.10–4.00)

## 2017-01-06 LAB — BASIC METABOLIC PANEL
BUN: 12 mg/dL (ref 6–23)
CALCIUM: 9.7 mg/dL (ref 8.4–10.5)
CO2: 32 mEq/L (ref 19–32)
CREATININE: 0.98 mg/dL (ref 0.40–1.50)
Chloride: 104 mEq/L (ref 96–112)
GFR: 78.96 mL/min (ref >60.00)
Glucose, Bld: 92 mg/dL (ref 70–99)
Potassium: 3.9 mEq/L (ref 3.5–5.1)
SODIUM: 141 meq/L (ref 135–145)

## 2017-01-06 LAB — HEMOGLOBIN A1C: HEMOGLOBIN A1C: 5.7 % (ref 4.6–6.5)

## 2017-01-06 LAB — TSH: TSH: 1.79 u[IU]/mL (ref 0.35–4.50)

## 2017-01-06 MED ORDER — METOPROLOL TARTRATE 25 MG PO TABS: 25.0000 mg | ORAL_TABLET | Freq: Two times a day (BID) | ORAL | 3 refills | Status: DC

## 2017-01-06 NOTE — Progress Notes (Signed)
   Subjective:    Patient ID: Tanner Hensley, male    DOB: October 01, 1940, 76 y.o.   MRN: [id number]  HPI Here with his wife to follow up on multiple issues. He feels well and his back pain has almost totally resolved after his surgery. He is going to PT and all his restrictions have been taken away. He actually spent the past few days moving paving stones in his yard, fertilizing and planting, and his back held up well. His BP is stable. He will see is Cardiologist again in June. His wife is very concerned about his memory however, and she says it is fading rapidly despite taking Namenda and Aricept. He still drives but he gets confused and his memory is poor. He seems to minimize this however and he does not think his memory is as bad as his wife does.    Review of Systems  Constitutional: Negative.   HENT: Negative.   Eyes: Negative.   Respiratory: Negative.   Cardiovascular: Negative.   Gastrointestinal: Negative.   Genitourinary: Negative.   Musculoskeletal: Negative.   Skin: Negative.   Neurological: Negative.   Psychiatric/Behavioral: Positive for confusion. Negative for agitation, decreased concentration, dysphoric mood, hallucinations, self-injury, sleep disturbance and suicidal ideas. The patient is not nervous/anxious and is not hyperactive.        Objective:   Physical Exam  Constitutional: He is oriented to person, place, and time. He appears well-developed and well-nourished. No distress.  HENT:  Head: Normocephalic and atraumatic.  Right Ear: External ear normal.  Left Ear: External ear normal.  Nose: Nose normal.  Mouth/Throat: Oropharynx is clear and moist. No oropharyngeal exudate.  Eyes: Conjunctivae and EOM are normal. Pupils are equal, round, and reactive to light. Right eye exhibits no discharge. Left eye exhibits no discharge. No scleral icterus.  Neck: Neck supple. No JVD present. No tracheal deviation present. No thyromegaly present.  Cardiovascular: Normal  rate, regular rhythm, normal heart sounds and intact distal pulses.  Exam reveals no gallop and no friction rub.   No murmur heard. Pulmonary/Chest: Effort normal and breath sounds normal. No respiratory distress. He has no wheezes. He has no rales. He exhibits no tenderness.  Abdominal: Soft. Bowel sounds are normal. He exhibits no distension and no mass. There is no tenderness. There is no rebound and no guarding.  Genitourinary: Rectum normal, prostate normal and penis normal. Rectal exam shows guaiac negative stool. No penile tenderness.  Musculoskeletal: Normal range of motion. He exhibits no edema or tenderness.  Lymphadenopathy:    He has no cervical adenopathy.  Neurological: He is alert and oriented to person, place, and time. He has normal reflexes. No cranial nerve deficit. He exhibits normal muscle tone. Coordination normal.  Skin: Skin is warm and dry. No rash noted. He is not diaphoretic. No erythema. No pallor.  Psychiatric: He has a normal mood and affect. His behavior is normal. Judgment and thought content normal.          Assessment & Plan:  His HTN is well controlled. His atrial fibrillation is under control. We will get fasting labs today. Check his diabetes with an A1c. Check lipids. I think at some point in the near future we should have them come back in to have a discussion about his dementia, the safety of driving, etc. He is past due for a colonoscopy so they will contact the GI office to set this up.  Alysia Penna, MD

## 2017-01-10 ENCOUNTER — Encounter: Payer: Self-pay | Admitting: Family Medicine

## 2017-01-10 DIAGNOSIS — M4326 Fusion of spine, lumbar region: Secondary | ICD-10-CM | POA: Diagnosis not present

## 2017-01-17 DIAGNOSIS — I442 Atrioventricular block, complete: Secondary | ICD-10-CM | POA: Diagnosis not present

## 2017-01-17 DIAGNOSIS — I482 Chronic atrial fibrillation: Secondary | ICD-10-CM | POA: Diagnosis not present

## 2017-01-17 DIAGNOSIS — I2510 Atherosclerotic heart disease of native coronary artery without angina pectoris: Secondary | ICD-10-CM | POA: Diagnosis not present

## 2017-01-17 DIAGNOSIS — I495 Sick sinus syndrome: Secondary | ICD-10-CM | POA: Diagnosis not present

## 2017-01-17 DIAGNOSIS — Z950 Presence of cardiac pacemaker: Secondary | ICD-10-CM | POA: Diagnosis not present

## 2017-02-07 NOTE — Addendum Note (Signed)
Addendum  created 02/07/17 1110 by Oleta Mouse, MD   Sign clinical note

## 2017-02-13 ENCOUNTER — Other Ambulatory Visit: Payer: Self-pay | Admitting: Family Medicine

## 2017-02-14 ENCOUNTER — Encounter: Payer: Self-pay | Admitting: Physician Assistant

## 2017-02-14 ENCOUNTER — Encounter (INDEPENDENT_AMBULATORY_CARE_PROVIDER_SITE_OTHER): Payer: Self-pay

## 2017-02-14 ENCOUNTER — Ambulatory Visit (INDEPENDENT_AMBULATORY_CARE_PROVIDER_SITE_OTHER): Payer: Medicare Other | Admitting: Physician Assistant

## 2017-02-14 VITALS — BP 130/68 | HR 81 | Ht 65.5 in | Wt 162.0 lb

## 2017-02-14 DIAGNOSIS — Z7901 Long term (current) use of anticoagulants: Secondary | ICD-10-CM | POA: Diagnosis not present

## 2017-02-14 DIAGNOSIS — Z86010 Personal history of colonic polyps: Secondary | ICD-10-CM

## 2017-02-14 NOTE — Patient Instructions (Signed)
You have been scheduled for a colonoscopy. Please follow written instructions given to you at your visit today.   If you use inhalers (even only as needed), please bring them with you on the day of your procedure. Your physician has requested that you go to www.startemmi.com and enter the access code given to you at your visit today. This web site gives a general overview about your procedure. However, you should still follow specific instructions given to you by our office regarding your preparation for the procedure.  We will call you with the Coumadin instructions when we hear from Dr. Ola Spurr, [facility] office.

## 2017-02-14 NOTE — Progress Notes (Signed)
Subjective:    Patient ID: Tanner Hensley, male    DOB: 04/14/1941, 76 y.o.   MRN: [id number]  HPI  Tanner Hensley is a very nice 76 year old white male, known to Dr. Ardis Hensley who comes in today to discuss follow-up colonoscopy.  Patient had last colonoscopy in 2008 with finding of left-sided diverticulosis and a 4 mm polyp in the transverse colon which was a tubular adenoma. He was to have 5 year interval follow-up. Patient has had other medical issues over the past few years and says he is now getting back around to having a colonoscopy. Patient has history of hypertension, atrial fibrillation for which she is on Coumadin, coronary artery disease, adult-onset diabetes mellitus, myalgia rheumatica. 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 He is followed by Dr. Ola Hensley in [facility]. Patient also had a lumbar laminectomy in February 2018. He says he is feeling well, he has not had any recent cardiac issues. Denies any problems with abdominal pain, changes in bowel habits melena or hematochezia.  Review of Systems Pertinent positive and negative review of systems were noted in the above HPI section.  All other review of systems was otherwise negative.  Outpatient Encounter Prescriptions as of 02/14/2017  Medication Sig  . acetaminophen (TYLENOL) 500 MG tablet Take 1,000 mg by mouth 3 (three) times daily.  Marland Kitchen alendronate (FOSAMAX) 70 MG tablet TAKE 1 TABLET BY MOUTH  EVERY 7 DAYS. TAKE WITH  FULL GLASS OF WATER  . Ascorbic Acid 500 MG CAPS Take 500 mg by mouth at bedtime.   Marland Kitchen aspirin EC 81 MG tablet Take 81 mg by mouth daily.  . calcium citrate-vitamin D (CITRACAL+D) 315-200 MG-UNIT tablet Take 1 tablet by mouth 2 (two) times daily.  Marland Kitchen donepezil (ARICEPT) 10 MG tablet Take 1 tablet (10 mg total) by mouth at bedtime.  . Flaxseed, Linseed, (FLAX SEEDS PO) Take 1 capsule by mouth.  Marland Kitchen lisinopril (PRINIVIL,ZESTRIL) 40 MG tablet TAKE 1 TABLET BY MOUTH  DAILY  . memantine  (NAMENDA) 10 MG tablet TAKE 1 TABLET BY MOUTH TWO  TIMES DAILY  . Multiple Vitamin (MULTIVITAMIN WITH MINERALS) TABS tablet Take 1 tablet by mouth daily. SENIOR MULTIVITAMIN  . warfarin (COUMADIN) 5 MG tablet Take 5 mg by mouth daily.  . metoprolol tartrate (LOPRESSOR) 25 MG tablet Take 1 tablet (25 mg total) by mouth 2 (two) times daily.   No facility-administered encounter medications on file as of 02/14/2017.    Allergies  Allergen Reactions  . Codeine Rash and Other (See Comments)    Dizziness, low blood pressure and heart rate  . Hydrocodone-Acetaminophen Rash and Other (See Comments)    Dizziness, low blood pressure and heart rate  . Tramadol Nausea And Vomiting  . Lipitor [Atorvastatin] Other (See Comments)    Myalgias   . Robaxin [Methocarbamol] Other (See Comments)    Dropped blood pressure too much  . Zocor [Simvastatin] Other (See Comments)    myalgias   Patient Active Problem List   Diagnosis Date Noted  . Lower extremity weakness   . Radiculopathy 10/13/2016  . Syncope 10/13/2016  . CAD (coronary artery disease) 10/13/2016  . Memory loss 02/22/2014  . 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 KNEE SPRAIN 11/10/2009  . INGUINAL HERNIA 09/17/2009  . CHEST WALL PAIN, ACUTE 09/17/2009  . Diabetes mellitus without complication ([facility]) 32/67/1245  . HYPERLIPIDEMIA 09/13/2008  . OVERACTIVE BLADDER 01/26/2008  . 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  Essential hypertension 05/22/2007  . GERD 05/22/2007  . ATRIAL FIBRILLATION, CHRONIC 05/01/2007   Social History   Social History  . Marital status: Married    Spouse name: N/A  . Number of children: N/A  . Years of education: N/A   Occupational History  . Not on file.   Social History Main Topics  . Smoking status: Never Smoker  . Smokeless tobacco: Never Used  . Alcohol use No  . Drug use: No  . Sexual activity: No   Other Topics Concern  . Not on file   Social  History Narrative  . No narrative on file    [name]'s family history includes Coronary artery disease in his other; Heart disease in his other; Hypertension in his other.      Objective:    Vitals:   02/14/17 1337  BP: 130/68  Pulse: 81    Physical Exam   well-developed older white male in no acute distress, pleasant accompanied by his wife blood pressure 130/68 pulse 81, height 5 foot 5, weight 162,, BMI 26.5. HEENT; nontraumatic normocephalic EOMI PERRLA sclerae anicteric, Cardiovascular; regular rate and rhythm with S1-S2 no murmur rub or gallop, Pacemaker in chest wall, Pulmonary; clear bilaterally, Abdomen; soft nontender nondistended bowel sounds are active there is no palpable mass or hepatosplenomegaly to exam not done, Ext; no clubbing cyanosis or edema skin warm and dry, Neuropsych; mood and affect appropriate       Assessment & Plan:   #39 76 year old white male with history of adenomatous colon polyp on colonoscopy 2008, overdue for follow-up. Currently asymptomatic #2 chronic anticoagulation-on Coumadin #3 history of atrial fibrillation #4 history of complete heart block status post pacemaker #5 history of PMR #6 adult-onset diabetes mellitus #7 coronary artery disease  #8 hypertension  Plan; Patient will be scheduled for colonoscopy with Dr. Ardis Hensley. Procedure discussed in detail with patient including risks and benefits and he is agreeable to proceed. Coumadin will need to be held for 5 days prior to Colonoscopy. We will communicate with his cardiologist Dr. Ola Hensley to assure that holding Coumadin for 5 days prior to the colonoscopy is acceptable for this patient.  Tanner Hensley Genia Harold PA-C 02/14/2017   Cc: Tanner Morale, MD

## 2017-02-15 NOTE — Progress Notes (Signed)
I agree with the above note, plan 

## 2017-02-17 ENCOUNTER — Telehealth: Payer: Self-pay | Admitting: Family Medicine

## 2017-02-17 ENCOUNTER — Ambulatory Visit (INDEPENDENT_AMBULATORY_CARE_PROVIDER_SITE_OTHER): Payer: Medicare Other | Admitting: Family Medicine

## 2017-02-17 DIAGNOSIS — I480 Paroxysmal atrial fibrillation: Secondary | ICD-10-CM

## 2017-02-17 LAB — POCT INR: INR: 2

## 2017-02-17 MED ORDER — WARFARIN SODIUM 5 MG PO TABS: 5.0000 mg | ORAL_TABLET | Freq: Every day | ORAL | 1 refills | Status: DC

## 2017-02-17 NOTE — Telephone Encounter (Signed)
Patient was seen today for a nurse visit for coumadin and needs a refill for warfin optm rx. Thanks

## 2017-02-17 NOTE — Progress Notes (Signed)
Pt advised, verbalized understanding. He will call clinic later to schedule an appt for next week.

## 2017-02-17 NOTE — Telephone Encounter (Signed)
Rx sent 

## 2017-02-22 ENCOUNTER — Telehealth: Payer: Self-pay | Admitting: *Deleted

## 2017-02-22 NOTE — Telephone Encounter (Signed)
[facility] regarding coumadin clearance letter I faxed to them on 02-04-2017.  Dawn answered and said I did fax it to the right number, [phone]. She will send this message to the nurse, Abigail Butts who works with Dr. Adrian Prows. I left my name and number and fax number.

## 2017-02-23 ENCOUNTER — Ambulatory Visit (INDEPENDENT_AMBULATORY_CARE_PROVIDER_SITE_OTHER): Payer: Medicare Other | Admitting: Physician Assistant

## 2017-02-23 VITALS — BP 124/69 | HR 96 | Ht 64.86 in | Wt 159.0 lb

## 2017-02-23 DIAGNOSIS — I480 Paroxysmal atrial fibrillation: Secondary | ICD-10-CM

## 2017-02-23 LAB — POCT INR: INR: 3.3

## 2017-02-23 NOTE — Addendum Note (Signed)
Addended by: Teddy Spike on: 02/23/2017 03:50 PM   Modules accepted: Level of Service

## 2017-02-23 NOTE — Progress Notes (Signed)
Decrease dose.  Whole tab (5 mg daily) every day except 1/2 tablet on wednesday. Recheck in 2 weeks.

## 2017-02-23 NOTE — Patient Instructions (Signed)
Decrease dose.  Whole tab (5 mg daily) every day except 1/2 tablet on wednesday. Recheck in 2 weeks.

## 2017-02-23 NOTE — Progress Notes (Addendum)
Pt here for INR check no missed doses, diet changes,bruising,bleeding,CP,SOB. He takes 5 mg daily.Maryruth Eve, Lahoma Crocker   Decrease dose.  Whole tab (5 mg daily) every day except 1/2 tablet on wednesday. Recheck in 2 weeks. Iran Planas PA-C

## 2017-02-24 NOTE — Telephone Encounter (Signed)
Called and spoke to the patient's wife Edmonia Lynch.( DPR signed.) Advised per Dr. Adrian Prows, he can hold the Coumadin on 7-26 through 7-31.  He can resume it after the procedure that day when he gets home or the next morning on 04-06-2017.

## 2017-03-10 ENCOUNTER — Ambulatory Visit (INDEPENDENT_AMBULATORY_CARE_PROVIDER_SITE_OTHER): Payer: Medicare Other | Admitting: Family Medicine

## 2017-03-10 DIAGNOSIS — I480 Paroxysmal atrial fibrillation: Secondary | ICD-10-CM

## 2017-03-10 LAB — POCT INR: INR: 2.3

## 2017-03-10 NOTE — Progress Notes (Signed)
Pt in for INR today, 2.3 is the result.  Pt's bp was high all three times I checked it.  156/95. 162/99, & 163/89.  He said he is going to go home & call his pcp with these numbers.

## 2017-03-10 NOTE — Progress Notes (Signed)
LMOM notifying pt to stay on same coumadin dosing schedule and to recheck in 3 weeks.

## 2017-04-03 ENCOUNTER — Other Ambulatory Visit: Payer: Self-pay | Admitting: Family Medicine

## 2017-04-04 DIAGNOSIS — I482 Chronic atrial fibrillation: Secondary | ICD-10-CM | POA: Diagnosis not present

## 2017-04-04 DIAGNOSIS — Z45018 Encounter for adjustment and management of other part of cardiac pacemaker: Secondary | ICD-10-CM | POA: Diagnosis not present

## 2017-04-04 NOTE — Telephone Encounter (Signed)
Can we refill this? 

## 2017-04-05 ENCOUNTER — Ambulatory Visit (AMBULATORY_SURGERY_CENTER): Payer: Medicare Other | Admitting: Gastroenterology

## 2017-04-05 ENCOUNTER — Encounter: Payer: Self-pay | Admitting: Gastroenterology

## 2017-04-05 VITALS — BP 87/59 | HR 80 | Temp 98.0°F | Resp 30 | Ht 65.5 in | Wt 162.0 lb

## 2017-04-05 DIAGNOSIS — K649 Unspecified hemorrhoids: Secondary | ICD-10-CM | POA: Diagnosis not present

## 2017-04-05 DIAGNOSIS — K5730 Diverticulosis of large intestine without perforation or abscess without bleeding: Secondary | ICD-10-CM | POA: Diagnosis not present

## 2017-04-05 DIAGNOSIS — Z86010 Personal history of colonic polyps: Secondary | ICD-10-CM | POA: Diagnosis present

## 2017-04-05 HISTORY — PX: COLONOSCOPY W/ BIOPSIES AND POLYPECTOMY: SHX1376

## 2017-04-05 MED ORDER — SODIUM CHLORIDE 0.9 % IV SOLN: 500.0000 mL | INTRAVENOUS | Status: AC

## 2017-04-05 NOTE — Progress Notes (Signed)
Pt. Reports no change in his medical or surgical history since pre-visit 02/14/2017.

## 2017-04-05 NOTE — Op Note (Signed)
[facility] Patient Name: Tanner Hensley Procedure Date: 04/05/2017 9:27 AM MRN: [id number] Endoscopist: Milus Banister , MD Age: 76 Referring MD:  Date of Birth: [birth year] Gender: Male Account #: [id number] Procedure:                Colonoscopy Indications:              High risk colon cancer surveillance: Personal                            history of colonic polyps (single subCM adenoma                            removed 2008) Medicines:                Monitored Anesthesia Care Procedure:                Pre-Anesthesia Assessment:                           - Prior to the procedure, a History and Physical                            was performed, and patient medications and                            allergies were reviewed. The patient's tolerance of                            previous anesthesia was also reviewed. The risks                            and benefits of the procedure and the sedation                            options and risks were discussed with the patient.                            All questions were answered, and informed consent                            was obtained. Prior Anticoagulants: The patient has                            taken Coumadin (warfarin), last dose was 5 days                            prior to procedure. ASA Grade Assessment: III - A                            patient with severe systemic disease. After                            reviewing the risks and benefits, the patient was  deemed in satisfactory condition to undergo the                            procedure.                           After obtaining informed consent, the colonoscope                            was passed under direct vision. Throughout the                            procedure, the patient's blood pressure, pulse, and                            oxygen saturations were monitored continuously. The   Colonoscope was introduced through the anus and                            advanced to the the cecum, identified by                            appendiceal orifice and ileocecal valve. The                            colonoscopy was performed without difficulty. The                            patient tolerated the procedure well. The quality                            of the bowel preparation was excellent. The                            ileocecal valve, appendiceal orifice, and rectum                            were photographed. Scope In: 9:37:17 AM Scope Out: 9:43:46 AM Scope Withdrawal Time: 0 hours 5 minutes 12 seconds  Total Procedure Duration: 0 hours 6 minutes 29 seconds  Findings:                 Multiple small and large-mouthed diverticula were                            found in the left colon.                           Internal hemorrhoids were found. The hemorrhoids                            were small.                           The exam was otherwise without abnormality on  direct and retroflexion views. Complications:            No immediate complications. Estimated blood loss:                            None. Estimated Blood Loss:     Estimated blood loss: none. Impression:               - Diverticulosis in the left colon.                           - Internal hemorrhoids.                           - The examination was otherwise normal on direct                            and retroflexion views.                           - No specimens collected. Recommendation:           - Patient has a contact number available for                            emergencies. The signs and symptoms of potential                            delayed complications were discussed with the                            patient. Return to normal activities tomorrow.                            Written discharge instructions were provided to the                             patient.                           - Resume previous diet.                           - Continue present medications.                           - You do not need any further colon cancer                            screening tests (including stool testing). These                            types of tests generally stop around age 36-80. Milus Banister, MD 04/05/2017 9:47:51 AM This report has been signed electronically.

## 2017-04-05 NOTE — Patient Instructions (Signed)
YOU HAD AN ENDOSCOPIC PROCEDURE TODAY AT THE [facility]:   Refer to the procedure report that was given to you for any specific questions about what was found during the examination.  If the procedure report does not answer your questions, please call your gastroenterologist to clarify.  If you requested that your care partner not be given the details of your procedure findings, then the procedure report has been included in a sealed envelope for you to review at your convenience later.  YOU SHOULD EXPECT: Some feelings of bloating in the abdomen. Passage of more gas than usual.  Walking can help get rid of the air that was put into your GI tract during the procedure and reduce the bloating. If you had a lower endoscopy (such as a colonoscopy or flexible sigmoidoscopy) you may notice spotting of blood in your stool or on the toilet paper. If you underwent a bowel prep for your procedure, you may not have a normal bowel movement for a few days.  Please Note:  You might notice some irritation and congestion in your nose or some drainage.  This is from the oxygen used during your procedure.  There is no need for concern and it should clear up in a day or so.  SYMPTOMS TO REPORT IMMEDIATELY:   Following lower endoscopy (colonoscopy or flexible sigmoidoscopy):  Excessive amounts of blood in the stool  Significant tenderness or worsening of abdominal pains  Swelling of the abdomen that is new, acute  Fever of 100F or higher   Please read all handouts given to you by your recovery nurse.  For urgent or emergent issues, a gastroenterologist can be reached at any hour by calling [phone].   DIET:  We do recommend a small meal at first, but then you may proceed to your regular diet.  Drink plenty of fluids but you should avoid alcoholic beverages for 24 hours.  ACTIVITY:  You should plan to take it easy for the rest of today and you should NOT DRIVE or use heavy machinery until  tomorrow (because of the sedation medicines used during the test).    FOLLOW UP: Our staff will call the number listed on your records the next business day following your procedure to check on you and address any questions or concerns that you may have regarding the information given to you following your procedure. If we do not reach you, we will leave a message.  However, if you are feeling well and you are not experiencing any problems, there is no need to return our call.  We will assume that you have returned to your regular daily activities without incident.  If any biopsies were taken you will be contacted by phone or by letter within the next 1-3 weeks.  Please call us at [phone] if you have not heard about the biopsies in 3 weeks.    SIGNATURES/CONFIDENTIALITY: You and/or your care partner have signed paperwork which will be entered into your electronic medical record.  These signatures attest to the fact that that the information above on your After Visit Summary has been reviewed and is understood.  Full responsibility of the confidentiality of this discharge information lies with you and/or your care-partner.  Thank you for letting us take care of your healthcare needs today. 

## 2017-04-05 NOTE — Progress Notes (Signed)
To PACU VSS. Report to [email or web address] 

## 2017-04-06 ENCOUNTER — Telehealth: Payer: Self-pay | Admitting: *Deleted

## 2017-04-06 ENCOUNTER — Telehealth: Payer: Self-pay

## 2017-04-06 NOTE — Telephone Encounter (Signed)
   Follow up Call-  Call back number 04/05/2017  Post procedure Call Back phone  # [phone]  wife's cell  Permission to leave phone message Yes  Some recent data might be hidden     Left message

## 2017-04-06 NOTE — Telephone Encounter (Signed)
  Follow up Call-  Call back number 04/05/2017  Post procedure Call Back phone  # [phone]  wife's cell  Permission to leave phone message Yes  Some recent data might be hidden     Patient questions:  Do you have a fever, pain , or abdominal swelling? No. Pain Score  0 *  Have you tolerated food without any problems? Yes.    Have you been able to return to your normal activities? Yes.    Do you have any questions about your discharge instructions: Diet   No. Medications  No. Follow up visit  No.  Do you have questions or concerns about your Care? No.  Actions: * If pain score is 4 or above: No action needed, pain <4.

## 2017-04-25 ENCOUNTER — Other Ambulatory Visit: Payer: Self-pay | Admitting: Family Medicine

## 2017-04-26 NOTE — Telephone Encounter (Signed)
Can we refill this? 

## 2017-06-08 ENCOUNTER — Ambulatory Visit (INDEPENDENT_AMBULATORY_CARE_PROVIDER_SITE_OTHER): Payer: Medicare Other | Admitting: Family Medicine

## 2017-06-08 DIAGNOSIS — I480 Paroxysmal atrial fibrillation: Secondary | ICD-10-CM | POA: Diagnosis not present

## 2017-06-08 LAB — POCT INR: INR: 2.2

## 2017-06-09 NOTE — Progress Notes (Signed)
Left recommendation on Pt's VM. Callback provided for any questions.  

## 2017-06-13 ENCOUNTER — Other Ambulatory Visit: Payer: Self-pay | Admitting: Adult Health

## 2017-06-14 NOTE — Telephone Encounter (Signed)
Can we refill this? 

## 2017-06-23 ENCOUNTER — Ambulatory Visit (INDEPENDENT_AMBULATORY_CARE_PROVIDER_SITE_OTHER): Payer: Medicare Other | Admitting: *Deleted

## 2017-06-23 DIAGNOSIS — Z23 Encounter for immunization: Secondary | ICD-10-CM

## 2017-07-01 DIAGNOSIS — M545 Low back pain: Secondary | ICD-10-CM | POA: Diagnosis not present

## 2017-07-20 ENCOUNTER — Other Ambulatory Visit: Payer: Self-pay

## 2017-07-20 DIAGNOSIS — I482 Chronic atrial fibrillation, unspecified: Secondary | ICD-10-CM

## 2017-07-21 ENCOUNTER — Ambulatory Visit: Payer: Medicare Other

## 2017-08-01 DIAGNOSIS — Z950 Presence of cardiac pacemaker: Secondary | ICD-10-CM | POA: Diagnosis not present

## 2017-08-09 ENCOUNTER — Ambulatory Visit: Payer: Self-pay | Admitting: *Deleted

## 2017-08-09 NOTE — Telephone Encounter (Signed)
Called in concerned about his BP being too high.   I made an appt with Dr. Sarajane Jews for 08/10/17 at 1:30.     Reason for Disposition . [1] Systolic BP  >= 962 OR Diastolic >= 90 AND [2] taking BP medications  Answer Assessment - Initial Assessment Questions 1. BLOOD PRESSURE: "What is the blood pressure?" "Did you take at least two measurements 5 minutes apart?"     196/97 right arm     2. ONSET: "When did you take your blood pressure?"     Just a few minutes 3. HOW: "How did you obtain the blood pressure?" (e.g., visiting nurse, automatic home BP monitor)     My BP machine at home 4. HISTORY: "Do you have a history of high blood pressure?"     Yes 5. MEDICATIONS: "Are you taking any medications for blood pressure?" "Have you missed any doses recently?"     Taking every day 6. OTHER SYMPTOMS: "Do you have any symptoms?" (e.g., headache, chest pain, blurred vision, difficulty breathing, weakness)     No other symptoms 7. PREGNANCY: "Is there any chance you are pregnant?" "When was your last menstrual period?"     N/A  Protocols used: HIGH BLOOD PRESSURE-A-AH

## 2017-08-10 ENCOUNTER — Ambulatory Visit (INDEPENDENT_AMBULATORY_CARE_PROVIDER_SITE_OTHER): Payer: Medicare Other | Admitting: Family Medicine

## 2017-08-10 ENCOUNTER — Encounter: Payer: Self-pay | Admitting: Family Medicine

## 2017-08-10 ENCOUNTER — Ambulatory Visit: Payer: Medicare Other | Admitting: Family Medicine

## 2017-08-10 VITALS — BP 164/80 | HR 80 | Temp 98.4°F | Wt 164.0 lb

## 2017-08-10 DIAGNOSIS — I10 Essential (primary) hypertension: Secondary | ICD-10-CM | POA: Diagnosis not present

## 2017-08-10 DIAGNOSIS — I482 Chronic atrial fibrillation, unspecified: Secondary | ICD-10-CM

## 2017-08-10 LAB — POCT INR: INR: 2.4

## 2017-08-10 MED ORDER — METOPROLOL TARTRATE 50 MG PO TABS: 50.0000 mg | ORAL_TABLET | Freq: Two times a day (BID) | ORAL | 3 refills | Status: DC

## 2017-08-10 NOTE — Progress Notes (Signed)
Call pt: same dose. Recheck in 4 weeks.

## 2017-08-10 NOTE — Progress Notes (Signed)
Pt advised.

## 2017-08-10 NOTE — Progress Notes (Signed)
   Subjective:    Patient ID: Tanner Hensley, male    DOB: November 19, 1940, 76 y.o.   MRN: [id number]  HPI Here to discuss his BP. Over the past month he has had readings of 160s to 180s over 90s. He feels fine. No other changes in medications. No ankle swelling. No SOB or chest pain or headaches.   Review of Systems  Constitutional: Negative.   Respiratory: Negative.   Cardiovascular: Negative.   Neurological: Negative.        Objective:   Physical Exam  Constitutional: He is oriented to person, place, and time. He appears well-developed and well-nourished.  Neck: No thyromegaly present.  Cardiovascular: Normal rate, regular rhythm, normal heart sounds and intact distal pulses.  Pulmonary/Chest: Effort normal and breath sounds normal. No respiratory distress. He has no wheezes. He has no rales.  Musculoskeletal: He exhibits no edema.  Lymphadenopathy:    He has no cervical adenopathy.  Neurological: He is alert and oriented to person, place, and time.          Assessment & Plan:  HTN. We will increase the Metoprolol to 50 mg bid and keep the Lisinopril as it is. They will watch this at home. Alysia Penna, MD

## 2017-08-17 DIAGNOSIS — I482 Chronic atrial fibrillation: Secondary | ICD-10-CM | POA: Diagnosis not present

## 2017-08-17 DIAGNOSIS — I495 Sick sinus syndrome: Secondary | ICD-10-CM | POA: Diagnosis not present

## 2017-08-17 DIAGNOSIS — Z950 Presence of cardiac pacemaker: Secondary | ICD-10-CM | POA: Diagnosis not present

## 2017-08-17 DIAGNOSIS — I10 Essential (primary) hypertension: Secondary | ICD-10-CM | POA: Diagnosis not present

## 2017-08-17 DIAGNOSIS — R001 Bradycardia, unspecified: Secondary | ICD-10-CM | POA: Diagnosis not present

## 2017-09-21 ENCOUNTER — Other Ambulatory Visit: Payer: Self-pay

## 2017-09-21 ENCOUNTER — Other Ambulatory Visit: Payer: Self-pay | Admitting: Family Medicine

## 2017-09-21 DIAGNOSIS — I2510 Atherosclerotic heart disease of native coronary artery without angina pectoris: Secondary | ICD-10-CM | POA: Diagnosis not present

## 2017-09-21 DIAGNOSIS — I495 Sick sinus syndrome: Secondary | ICD-10-CM | POA: Diagnosis not present

## 2017-09-21 DIAGNOSIS — I10 Essential (primary) hypertension: Secondary | ICD-10-CM | POA: Diagnosis not present

## 2017-09-21 DIAGNOSIS — Z950 Presence of cardiac pacemaker: Secondary | ICD-10-CM | POA: Diagnosis not present

## 2017-09-21 DIAGNOSIS — I482 Chronic atrial fibrillation: Secondary | ICD-10-CM | POA: Diagnosis not present

## 2017-09-21 MED ORDER — WARFARIN SODIUM 5 MG PO TABS: 5.0000 mg | ORAL_TABLET | Freq: Every day | ORAL | 1 refills | Status: DC

## 2017-09-29 ENCOUNTER — Ambulatory Visit (INDEPENDENT_AMBULATORY_CARE_PROVIDER_SITE_OTHER): Payer: Medicare Other | Admitting: Family Medicine

## 2017-09-29 DIAGNOSIS — I482 Chronic atrial fibrillation, unspecified: Secondary | ICD-10-CM

## 2017-09-29 LAB — POCT INR: INR: 2.3

## 2017-09-29 NOTE — Progress Notes (Signed)
LMOM notifying pt to stay on same dosing regimen of coumadin & to recheck in 4 weeks.

## 2017-10-17 DIAGNOSIS — I495 Sick sinus syndrome: Secondary | ICD-10-CM | POA: Diagnosis not present

## 2017-10-17 DIAGNOSIS — Z950 Presence of cardiac pacemaker: Secondary | ICD-10-CM | POA: Diagnosis not present

## 2017-10-17 DIAGNOSIS — I10 Essential (primary) hypertension: Secondary | ICD-10-CM | POA: Diagnosis not present

## 2017-10-17 DIAGNOSIS — I482 Chronic atrial fibrillation: Secondary | ICD-10-CM | POA: Diagnosis not present

## 2017-10-17 DIAGNOSIS — R001 Bradycardia, unspecified: Secondary | ICD-10-CM | POA: Diagnosis not present

## 2018-01-03 ENCOUNTER — Ambulatory Visit (INDEPENDENT_AMBULATORY_CARE_PROVIDER_SITE_OTHER): Payer: Medicare Other | Admitting: Family Medicine

## 2018-01-03 DIAGNOSIS — I482 Chronic atrial fibrillation, unspecified: Secondary | ICD-10-CM

## 2018-01-03 LAB — POCT INR: INR: 2.4

## 2018-01-04 NOTE — Progress Notes (Signed)
Pt advised. Verbalized understanding.

## 2018-01-31 DIAGNOSIS — Z950 Presence of cardiac pacemaker: Secondary | ICD-10-CM | POA: Diagnosis not present

## 2018-02-16 ENCOUNTER — Other Ambulatory Visit: Payer: Self-pay | Admitting: Family Medicine

## 2018-02-16 NOTE — Telephone Encounter (Signed)
Last OV 08/10/2017   Last refilled 04/27/2017 disp 12 with 3 refills   Sent to PCP for approval

## 2018-02-20 ENCOUNTER — Other Ambulatory Visit: Payer: Self-pay | Admitting: Family Medicine

## 2018-02-20 NOTE — Telephone Encounter (Signed)
Last OV 08/10/2017   Last refilled 04/04/2017 disp 90 with 3 refills   Sent to PCP to advise   Pt will be out by next month since they do a 90 day supply he has no refills at the pharmacy.

## 2018-02-20 NOTE — Telephone Encounter (Signed)
Last OV 08/10/2018   Last refilled

## 2018-03-07 ENCOUNTER — Ambulatory Visit: Payer: Medicare Other | Admitting: Family Medicine

## 2018-03-29 ENCOUNTER — Encounter: Payer: Medicare Other | Admitting: Family Medicine

## 2018-03-31 ENCOUNTER — Other Ambulatory Visit: Payer: Self-pay | Admitting: Family Medicine

## 2018-04-06 ENCOUNTER — Encounter: Payer: Self-pay | Admitting: Family Medicine

## 2018-04-06 ENCOUNTER — Ambulatory Visit (INDEPENDENT_AMBULATORY_CARE_PROVIDER_SITE_OTHER): Payer: Medicare Other | Admitting: Family Medicine

## 2018-04-06 VITALS — BP 128/76 | HR 80 | Temp 98.3°F | Ht 65.0 in | Wt 157.2 lb

## 2018-04-06 DIAGNOSIS — I10 Essential (primary) hypertension: Secondary | ICD-10-CM

## 2018-04-06 DIAGNOSIS — I482 Chronic atrial fibrillation, unspecified: Secondary | ICD-10-CM

## 2018-04-06 DIAGNOSIS — R413 Other amnesia: Secondary | ICD-10-CM

## 2018-04-06 DIAGNOSIS — N401 Enlarged prostate with lower urinary tract symptoms: Secondary | ICD-10-CM

## 2018-04-06 DIAGNOSIS — E785 Hyperlipidemia, unspecified: Secondary | ICD-10-CM | POA: Diagnosis not present

## 2018-04-06 DIAGNOSIS — N138 Other obstructive and reflux uropathy: Secondary | ICD-10-CM

## 2018-04-06 DIAGNOSIS — K219 Gastro-esophageal reflux disease without esophagitis: Secondary | ICD-10-CM

## 2018-04-06 DIAGNOSIS — M353 Polymyalgia rheumatica: Secondary | ICD-10-CM | POA: Diagnosis not present

## 2018-04-06 DIAGNOSIS — N318 Other neuromuscular dysfunction of bladder: Secondary | ICD-10-CM

## 2018-04-06 DIAGNOSIS — E1169 Type 2 diabetes mellitus with other specified complication: Secondary | ICD-10-CM | POA: Diagnosis not present

## 2018-04-06 LAB — CBC WITH DIFFERENTIAL/PLATELET
BASOS PCT: 0.6 % (ref 0.0–3.0)
Basophils Absolute: 0 10*3/uL (ref 0.0–0.1)
EOS PCT: 4.3 % (ref 0.0–5.0)
Eosinophils Absolute: 0.3 10*3/uL (ref 0.0–0.7)
HCT: 37.3 % — ABNORMAL LOW (ref 39.0–52.0)
Hemoglobin: 12.7 g/dL — ABNORMAL LOW (ref 13.0–17.0)
LYMPHS ABS: 0.7 10*3/uL (ref 0.7–4.0)
Lymphocytes Relative: 10.6 % — ABNORMAL LOW (ref 12.0–46.0)
MCHC: 33.9 g/dL (ref 30.0–36.0)
MCV: 104.2 fl — AB (ref 78.0–100.0)
MONO ABS: 0.8 10*3/uL (ref 0.1–1.0)
MONOS PCT: 11.4 % (ref 3.0–12.0)
NEUTROS PCT: 73.1 % (ref 43.0–77.0)
Neutro Abs: 4.9 10*3/uL (ref 1.4–7.7)
Platelets: 136 10*3/uL — ABNORMAL LOW (ref 150.0–400.0)
RBC: 3.58 Mil/uL — ABNORMAL LOW (ref 4.22–5.81)
RDW: 12.6 % (ref 11.5–15.5)
WBC: 6.7 10*3/uL (ref 4.0–10.5)

## 2018-04-06 LAB — HEPATIC FUNCTION PANEL
ALK PHOS: 80 U/L (ref 39–117)
ALT: 17 U/L (ref 0–53)
AST: 22 U/L (ref 0–37)
Albumin: 4.2 g/dL (ref 3.5–5.2)
BILIRUBIN DIRECT: 0.2 mg/dL (ref 0.0–0.3)
TOTAL PROTEIN: 6.4 g/dL (ref 6.0–8.3)
Total Bilirubin: 1 mg/dL (ref 0.2–1.2)

## 2018-04-06 LAB — LIPID PANEL
CHOLESTEROL: 204 mg/dL — AB (ref 0–200)
HDL: 44.9 mg/dL (ref >39.00)
LDL CALC: 132 mg/dL — AB (ref 0–99)
NONHDL: 159.46
Total CHOL/HDL Ratio: 5
Triglycerides: 135 mg/dL (ref 0.0–149.0)
VLDL: 27 mg/dL (ref 0.0–40.0)

## 2018-04-06 LAB — POC URINALSYSI DIPSTICK (AUTOMATED)
BILIRUBIN UA: NEGATIVE
Blood, UA: NEGATIVE
GLUCOSE UA: NEGATIVE
KETONES UA: NEGATIVE
LEUKOCYTES UA: NEGATIVE
Nitrite, UA: NEGATIVE
Protein, UA: NEGATIVE
SPEC GRAV UA: 1.015 (ref 1.010–1.025)
Urobilinogen, UA: 0.2 E.U./dL
pH, UA: 7.5 (ref 5.0–8.0)

## 2018-04-06 LAB — BASIC METABOLIC PANEL
BUN: 19 mg/dL (ref 6–23)
CO2: 29 mEq/L (ref 19–32)
Calcium: 9.9 mg/dL (ref 8.4–10.5)
Chloride: 105 mEq/L (ref 96–112)
Creatinine, Ser: 1.21 mg/dL (ref 0.40–1.50)
GFR: 61.7 mL/min (ref >60.00)
Glucose, Bld: 91 mg/dL (ref 70–99)
POTASSIUM: 4.5 meq/L (ref 3.5–5.1)
SODIUM: 141 meq/L (ref 135–145)

## 2018-04-06 LAB — PSA: PSA: 1.42 ng/mL (ref 0.10–4.00)

## 2018-04-06 LAB — HEMOGLOBIN A1C: Hgb A1c MFr Bld: 6.1 % (ref 4.6–6.5)

## 2018-04-06 LAB — TSH: TSH: 2.44 u[IU]/mL (ref 0.35–4.50)

## 2018-04-06 NOTE — Progress Notes (Signed)
   Subjective:    Patient ID: Tanner Hensley, male    DOB: 18-Nov-1940, 77 y.o.   MRN: [id number]  HPI Here to follow up on issues. He feels well. He sees Cardiology regularly. His BP is stable at home. He walks for exercise. His memory seems to be stable on Aricept and Nemenda, and his wife agrees. He does have some joint pains and asks what he can take since he is on Coumadin. He has some mild nocturnal leg cramps.    Review of Systems  Constitutional: Negative.   HENT: Negative.   Eyes: Negative.   Respiratory: Negative.   Cardiovascular: Negative.   Gastrointestinal: Negative.   Genitourinary: Negative.   Musculoskeletal: Positive for arthralgias and myalgias.  Skin: Negative.   Neurological: Negative.   Psychiatric/Behavioral: Negative.        Objective:   Physical Exam  Constitutional: He is oriented to person, place, and time. He appears well-developed and well-nourished. No distress.  HENT:  Head: Normocephalic and atraumatic.  Right Ear: External ear normal.  Left Ear: External ear normal.  Nose: Nose normal.  Mouth/Throat: Oropharynx is clear and moist. No oropharyngeal exudate.  Eyes: Pupils are equal, round, and reactive to light. Conjunctivae and EOM are normal. Right eye exhibits no discharge. Left eye exhibits no discharge. No scleral icterus.  Neck: Neck supple. No JVD present. No tracheal deviation present. No thyromegaly present.  Cardiovascular: Normal rate, normal heart sounds and intact distal pulses. Exam reveals no gallop and no friction rub.  No murmur heard. Irregular rhythm   Pulmonary/Chest: Effort normal and breath sounds normal. No respiratory distress. He has no wheezes. He has no rales. He exhibits no tenderness.  Abdominal: Soft. Bowel sounds are normal. He exhibits no distension and no mass. There is no tenderness. There is no rebound and no guarding.  Genitourinary: Rectum normal, prostate normal and penis normal. Rectal exam shows guaiac  negative stool. No penile tenderness.  Musculoskeletal: Normal range of motion. He exhibits no edema or tenderness.  Lymphadenopathy:    He has no cervical adenopathy.  Neurological: He is alert and oriented to person, place, and time. He has normal reflexes. He displays normal reflexes. No cranial nerve deficit. He exhibits normal muscle tone. Coordination normal.  Skin: Skin is warm and dry. No rash noted. He is not diaphoretic. No erythema. No pallor.  Psychiatric: He has a normal mood and affect. His behavior is normal. Judgment and thought content normal.          Assessment & Plan:  His HTN and atrial fibrillation are stable. His memory loss seems to be stable for the time being. For his joint pains he may take up to 2000 mg of Tylenol daily. For the leg cramps he can try 400-800 mg of OTC magnesium. Given a rx to get the Shingrix vaccine at his pharmacy.  Alysia Penna, MD

## 2018-04-10 DIAGNOSIS — Z950 Presence of cardiac pacemaker: Secondary | ICD-10-CM | POA: Diagnosis not present

## 2018-04-10 DIAGNOSIS — R001 Bradycardia, unspecified: Secondary | ICD-10-CM | POA: Diagnosis not present

## 2018-04-12 ENCOUNTER — Ambulatory Visit (INDEPENDENT_AMBULATORY_CARE_PROVIDER_SITE_OTHER): Payer: Medicare Other | Admitting: Family Medicine

## 2018-04-12 DIAGNOSIS — I482 Chronic atrial fibrillation, unspecified: Secondary | ICD-10-CM

## 2018-04-12 LAB — POCT INR: INR: 2 (ref 2.0–3.0)

## 2018-04-12 NOTE — Progress Notes (Signed)
Left recommendation on VM. Callback provided for questions/scheduling.

## 2018-04-28 ENCOUNTER — Ambulatory Visit (INDEPENDENT_AMBULATORY_CARE_PROVIDER_SITE_OTHER): Payer: Medicare Other | Admitting: Family Medicine

## 2018-04-28 DIAGNOSIS — I4891 Unspecified atrial fibrillation: Secondary | ICD-10-CM | POA: Diagnosis not present

## 2018-04-28 LAB — POCT INR: INR: 2 (ref 2.0–3.0)

## 2018-05-01 NOTE — Progress Notes (Signed)
Patient advised of recommendations.  

## 2018-05-19 ENCOUNTER — Other Ambulatory Visit: Payer: Self-pay | Admitting: Family Medicine

## 2018-06-05 ENCOUNTER — Encounter

## 2018-06-05 ENCOUNTER — Encounter: Payer: Self-pay | Admitting: Diagnostic Neuroimaging

## 2018-06-05 ENCOUNTER — Ambulatory Visit: Payer: Medicare Other | Admitting: Diagnostic Neuroimaging

## 2018-06-05 VITALS — BP 114/70 | HR 89 | Ht 65.0 in | Wt 156.8 lb

## 2018-06-05 DIAGNOSIS — F03A0 Unspecified dementia, mild, without behavioral disturbance, psychotic disturbance, mood disturbance, and anxiety: Secondary | ICD-10-CM

## 2018-06-05 DIAGNOSIS — R413 Other amnesia: Secondary | ICD-10-CM

## 2018-06-05 DIAGNOSIS — F0390 Unspecified dementia without behavioral disturbance: Secondary | ICD-10-CM | POA: Diagnosis not present

## 2018-06-05 NOTE — Progress Notes (Signed)
[facility]  PATIENT: Tanner Hensley DOB: 1940/09/19  REFERRING CLINICIAN: Barbie Banner HISTORY FROM: patient  REASON FOR VISIT: new consult    HISTORICAL  CHIEF COMPLAINT:  Chief Complaint  Patient presents with  . Memory Loss    rm 7, New Pt, wife- Edmonia Lynch, Son Ruby Cola    MMSE 21    HISTORY OF PRESENT ILLNESS:   77 year old male here for evaluation of memory problems.  Patient has had memory problems since 2012, with short-term forgetfulness, difficulty with driving directions, difficulty making decisions, difficulty with new situations and tasks.  Symptoms are worse with stress and aggravation.  Some agitation.  Some slower cognitive processes.  He was about it by another neurologist in 2015, diagnosed with MCI, and offered Aricept.  Patient took this for 1 weeks and had to stop due to side effects.  2017 patient was tried on memantine by PCP and was able to tolerate this.  In 2018 he was retried on donepezil and did well on it.  Patient still able to do lots of his activities of daily living but needs more help with that.  He still drives, only with short distances in familiar locations.   REVIEW OF SYSTEMS: Full 14 system review of systems performed and negative with exception of: Restless legs memory loss confusion easy bruising cramps joint pain hearing loss runny nose.   ALLERGIES: Allergies  Allergen Reactions  . Codeine Rash and Other (See Comments)    Dizziness, low blood pressure and heart rate  . Hydrocodone-Acetaminophen Rash and Other (See Comments)    Dizziness, low blood pressure and heart rate  . Tramadol Nausea And Vomiting  . Lipitor [Atorvastatin] Other (See Comments)    Myalgias   . Robaxin [Methocarbamol] Other (See Comments)    Dropped blood pressure too much  . Zocor [Simvastatin] Other (See Comments)    myalgias    HOME MEDICATIONS: Outpatient Medications Prior to Visit  Medication Sig Dispense Refill  . acetaminophen  (TYLENOL) 500 MG tablet Take 1,000 mg by mouth at bedtime as needed.     Marland Kitchen alendronate (FOSAMAX) 70 MG tablet TAKE 1 TABLET BY MOUTH  EVERY 7 DAYS WITH FULL  GLASS OF WATER 12 tablet 3  . Ascorbic Acid (VITAMIN C) 500 MG CHEW Chew by mouth.    Marland Kitchen aspirin EC 81 MG tablet Take 81 mg by mouth daily.    Marland Kitchen atenolol (TENORMIN) 25 MG tablet Take 25 mg by mouth daily.  0  . b complex vitamins tablet Take 1 tablet by mouth daily.    . calcium citrate-vitamin D (CITRACAL+D) 315-200 MG-UNIT tablet Take 1 tablet by mouth 2 (two) times daily.    . chlorthalidone (HYGROTON) 25 MG tablet Take 25 mg by mouth daily.    . diphenhydramine-acetaminophen (TYLENOL PM) 25-500 MG TABS tablet Take 1 tablet by mouth at bedtime as needed.    . donepezil (ARICEPT) 10 MG tablet TAKE 1 TABLET BY MOUTH AT  BEDTIME 90 tablet 3  . Flaxseed, Linseed, (FLAX SEEDS PO) Take 1 capsule by mouth.    . lactase (LACTAID) 3000 units tablet Take by mouth 3 (three) times daily with meals.    Marland Kitchen lisinopril (PRINIVIL,ZESTRIL) 40 MG tablet TAKE 1 TABLET BY MOUTH  DAILY 90 tablet 3  . memantine (NAMENDA) 10 MG tablet TAKE 1 TABLET BY MOUTH  TWICE A DAY 180 tablet 3  . Multiple Vitamin (MULTIVITAMIN WITH MINERALS) TABS tablet Take 1 tablet by mouth daily. SENIOR MULTIVITAMIN    .  vitamin E 400 UNIT capsule Take 400 Units by mouth daily.    Marland Kitchen warfarin (COUMADIN) 5 MG tablet TAKE 1 TABLET BY MOUTH  DAILY 90 tablet 1   No facility-administered medications prior to visit.     PAST MEDICAL HISTORY: Past Medical History:  Diagnosis Date  . Arthritis    sees Dr. Charlestine Night  . Atrial fibrillation ([place])    (Dr. Phylliss Blakes at [facility] and Dr. Lovena Le at [facility]. [facility] polymyalgia rheumatica (Dr. Charlestine Night)  . CHF (congestive heart failure) (H[facility])    NOT SURE WHEN  . Coronary artery disease   . Diabetes mellitus type II    DIET CONTROLLED AND LOST 40 LBS  . Dysrhythmia    chronic a=fib with cardiac ablations  . GERD  (gastroesophageal reflux disease)   . Gout   . High cholesterol   . Hypertension   . Insomnia   . Memory loss   . Neuromuscular disorder ([place])    POLY MYALGIA   RHEUMATICA  . Polymyalgia rheumatica ([place])    sees Dr. Charlestine Night   . Presence of permanent cardiac pacemaker    [employer]  08/2011  . Urticaria     PAST SURGICAL HISTORY: Past Surgical History:  Procedure Laterality Date  . ATRIAL FIBRILLATION ABLATION  2006, 2008, 2013   at [facility]  by Dr. Ola Spurr  . BACK SURGERY    . CARDIAC CATHETERIZATION     has had multiple, but last one 2017 (10/13/2016)  . COLONOSCOPY W/ BIOPSIES AND POLYPECTOMY  04/05/2017   per Dr. Oretha Caprice, no polyps, no repeats needed   . CORONARY ANGIOPLASTY WITH STENT PLACEMENT  ~ 2014   "he has 2 stents" (10/13/2016)  . ESOPHAGOGASTRODUODENOSCOPY    . INGUINAL HERNIA REPAIR Right 11/28/2009   per Dr. Donnie Mesa  . KNEE ARTHROSCOPY Left 02/03/2010   per Dr. Lindwood Qua  . POSTERIOR LUMBAR FUSION  10/14/2016  . SPINE SURGERY  10/13/2016   per Dr. Lynann Bologna, L3-4 fusion  . TONSILLECTOMY      FAMILY HISTORY: Family History  Problem Relation Age of Onset  . Coronary artery disease Other   . Hypertension Other   . Heart disease Other   . Heart disease Mother   . Heart attack Father   . Heart disease Sister   . Diabetes Sister   . Hypertension Sister   . Other Brother        brain tumor  . Hypertension Brother   . Heart disease Brother     SOCIAL HISTORY: Social History   Socioeconomic History  . Marital status: Married    Spouse name: Edmonia Lynch  . Number of children: 3  . Years of education: 47  . Highest education level: Not on file  Occupational History    Comment: retired  Scientific laboratory technician  . Financial resource strain: Not on file  . Food insecurity:    Worry: Not on file    Inability: Not on file  . Transportation needs:    Medical: Not on file    Non-medical: Not on file  Tobacco Use  . Smoking status:  Never Smoker  . Smokeless tobacco: Never Used  Substance and Sexual Activity  . Alcohol use: No    Alcohol/week: 0.0 standard drinks  . Drug use: No  . Sexual activity: Never  Lifestyle  . Physical activity:    Days per week: Not on file    Minutes per session: Not on file  . Stress:  Not on file  Relationships  . Social connections:    Talks on phone: Not on file    Gets together: Not on file    Attends religious service: Not on file    Active member of club or organization: Not on file    Attends meetings of clubs or organizations: Not on file    Relationship status: Not on file  . Intimate partner violence:    Fear of current or ex partner: Not on file    Emotionally abused: Not on file    Physically abused: Not on file    Forced sexual activity: Not on file  Other Topics Concern  . Not on file  Social History Narrative   Lives with wife, at home   Caffeine- coffee, 2 cups, Dr Malachi Bonds 8 oz     PHYSICAL EXAM  GENERAL EXAM/CONSTITUTIONAL: Vitals:  Vitals:   06/05/18 0805  BP: 114/70  Pulse: 89  Weight: 156 lb 12.8 oz (71.1 kg)  Height: 5\' 5"  (1.651 m)     Body mass index is 26.09 kg/m. Wt Readings from Last 3 Encounters:  06/05/18 156 lb 12.8 oz (71.1 kg)  04/28/18 158 lb (71.7 kg)  04/06/18 157 lb 3.2 oz (71.3 kg)     Patient is in no distress; well developed, nourished and groomed; neck is supple  CARDIOVASCULAR:  Examination of carotid arteries is normal; no carotid bruits  Regular rate and rhythm, no murmurs  Examination of peripheral vascular system by observation and palpation is normal  EYES:  Ophthalmoscopic exam of optic discs and posterior segments is normal; no papilledema or hemorrhages  Visual Acuity Screening   Right eye Left eye Both eyes  Without correction:     With correction: 20/40 20/30   Comments: bifocals    MUSCULOSKELETAL:  Gait, strength, tone, movements noted in Neurologic exam below  NEUROLOGIC: MENTAL STATUS:     MMSE - Mini Mental State Exam 06/05/2018  Orientation to time 3  Orientation to Place 4  Registration 3  Attention/ Calculation 1  Recall 2  Language- name 2 objects 2  Language- repeat 0  Language- follow 3 step command 3  Language- read & follow direction 1  Write a sentence 1  Copy design 1  Total score 21    awake, alert, oriented to person, place and time  recent and remote memory intact  normal attention and concentration  language fluent, comprehension intact, naming intact  fund of knowledge appropriate  CRANIAL NERVE:   2nd - no papilledema on fundoscopic exam  2nd, 3rd, 4th, 6th - pupils equal and reactive to light, visual fields full to confrontation, extraocular muscles intact, no nystagmus  5th - facial sensation symmetric  7th - facial strength symmetric  8th - hearing intact  9th - palate elevates symmetrically, uvula midline  11th - shoulder shrug symmetric  12th - tongue protrusion midline  MOTOR:   normal bulk and tone, full strength in the BUE, BLE  SENSORY:   normal and symmetric to light touch, temperature, vibration  COORDINATION:   finger-nose-finger, fine finger movements normal  REFLEXES:   deep tendon reflexes present and symmetric  GAIT/STATION:   narrow based gait\     DIAGNOSTIC DATA (LABS, IMAGING, TESTING) - I reviewed patient records, labs, notes, testing and imaging myself where available.  Lab Results  Component Value Date   WBC 6.7 04/06/2018   HGB 12.7 (L) 04/06/2018   HCT 37.3 (L) 04/06/2018   MCV 104.2 (H) 04/06/2018  PLT 136.0 (L) 04/06/2018      Component Value Date/Time   NA 141 04/06/2018 0950   K 4.5 04/06/2018 0950   CL 105 04/06/2018 0950   CO2 29 04/06/2018 0950   GLUCOSE 91 04/06/2018 0950   BUN 19 04/06/2018 0950   CREATININE 1.21 04/06/2018 0950   CALCIUM 9.9 04/06/2018 0950   PROT 6.4 04/06/2018 0950   ALBUMIN 4.2 04/06/2018 0950   AST 22 04/06/2018 0950   ALT 17 04/06/2018  0950   ALKPHOS 80 04/06/2018 0950   BILITOT 1.0 04/06/2018 0950   GFRNONAA >60 10/16/2016 0522   GFRAA >60 10/16/2016 0522   Lab Results  Component Value Date   CHOL 204 (H) 04/06/2018   HDL 44.90 04/06/2018   LDLCALC 132 (H) 04/06/2018   LDLDIRECT 176.1 07/20/2012   TRIG 135.0 04/06/2018   CHOLHDL 5 04/06/2018   Lab Results  Component Value Date   HGBA1C 6.1 04/06/2018   Lab Results  Component Value Date   VITAMINB12 224 02/08/2014   Lab Results  Component Value Date   TSH 2.44 04/06/2018     10/13/16 CT head [I reviewed images myself and agree with interpretation. -VRP]  1. No acute intracranial abnormality. 2. Advanced chronic microvascular ischemia.    ASSESSMENT AND PLAN  77 y.o. year old male here with gradual onset progressive short-term memory loss confusion consistent with neurodegenerative dementia.  Dx:  1. Mild dementia ([place])   2. Memory loss      PLAN:  - continue memantine 10mg  twice a day + donepezil 10mg  at bedtime - safety / supervision issues reviewed - caregiver resources provided - caution with driving and finances  Return if symptoms worsen or fail to improve, for return to PCP.    Penni Bombard, MD 6/97/9480, 1:65 AM Certified in Neurology, Neurophysiology and Neuroimaging  [facility] [address] [place], [place] [address] [phone]

## 2018-06-05 NOTE — Patient Instructions (Signed)
-   continue memantine 10mg  twice a day + donepezil 10mg  at bedtime  - safety / supervision issues reviewed  - caregiver resources provided  - caution with driving and finances

## 2018-06-06 ENCOUNTER — Ambulatory Visit (INDEPENDENT_AMBULATORY_CARE_PROVIDER_SITE_OTHER): Payer: Medicare Other | Admitting: Family Medicine

## 2018-06-06 DIAGNOSIS — I4891 Unspecified atrial fibrillation: Secondary | ICD-10-CM

## 2018-06-06 LAB — POCT INR: INR: 3.3 — AB (ref 2.0–3.0)

## 2018-06-07 ENCOUNTER — Encounter: Payer: Self-pay | Admitting: Family Medicine

## 2018-06-07 ENCOUNTER — Telehealth: Payer: Self-pay | Admitting: Family Medicine

## 2018-06-07 ENCOUNTER — Ambulatory Visit (INDEPENDENT_AMBULATORY_CARE_PROVIDER_SITE_OTHER): Payer: Medicare Other | Admitting: Family Medicine

## 2018-06-07 ENCOUNTER — Ambulatory Visit (INDEPENDENT_AMBULATORY_CARE_PROVIDER_SITE_OTHER): Payer: Medicare Other

## 2018-06-07 VITALS — BP 114/70 | HR 79 | Temp 98.8°F | Wt 156.1 lb

## 2018-06-07 DIAGNOSIS — R05 Cough: Secondary | ICD-10-CM | POA: Diagnosis not present

## 2018-06-07 DIAGNOSIS — J181 Lobar pneumonia, unspecified organism: Secondary | ICD-10-CM | POA: Diagnosis not present

## 2018-06-07 DIAGNOSIS — J189 Pneumonia, unspecified organism: Secondary | ICD-10-CM

## 2018-06-07 MED ORDER — AMOXICILLIN-POT CLAVULANATE 875-125 MG PO TABS: 1.0000 | ORAL_TABLET | Freq: Two times a day (BID) | ORAL | 0 refills | Status: DC

## 2018-06-07 NOTE — Telephone Encounter (Signed)
Stacy from [facility] calling to report that chest xray results from today show that the patient has left lower lobe pneumonia. Results available in Epic.

## 2018-06-07 NOTE — Progress Notes (Signed)
   Subjective:    Patient ID: Tanner Hensley, male    DOB: [birth year], 77 y.o.   MRN: [id number]  HPI Here for one week of coughing up yellow sputum. Some generalized fatigue. No chest pain or fever or SOB.    Review of Systems  Constitutional: Positive for fatigue. Negative for chills, diaphoresis and fever.  HENT: Negative.   Eyes: Negative.   Respiratory: Positive for cough. Negative for chest tightness, shortness of breath and wheezing.   Cardiovascular: Negative.        Objective:   Physical Exam  Constitutional: He appears well-developed and well-nourished. No distress.  HENT:  Right Ear: External ear normal.  Left Ear: External ear normal.  Nose: Nose normal.  Mouth/Throat: Oropharynx is clear and moist.  Eyes: Conjunctivae are normal.  Neck: No thyromegaly present.  Pulmonary/Chest: Effort normal. No stridor. No respiratory distress. He has no wheezes.  Rales are present in the posterior left base   Lymphadenopathy:    He has no cervical adenopathy.          Assessment & Plan:  CAP, treat with Augmentin. Recheck prn.  Alysia Penna, MD

## 2018-06-07 NOTE — Telephone Encounter (Signed)
Called and spoke to Mignon Pine, assistant to Dr. Sarajane Jews.  Made her aware of results.  She stated Dr. Sarajane Jews is aware.  No further actions required.

## 2018-06-08 ENCOUNTER — Ambulatory Visit: Payer: Self-pay | Admitting: *Deleted

## 2018-06-08 ENCOUNTER — Telehealth: Payer: Self-pay | Admitting: Family Medicine

## 2018-06-08 NOTE — Progress Notes (Signed)
Corrected coumadin doseing.

## 2018-06-08 NOTE — Telephone Encounter (Signed)
See triage encounter from 06/08/18

## 2018-06-08 NOTE — Progress Notes (Signed)
Patient's wife advised

## 2018-06-08 NOTE — Telephone Encounter (Signed)
Dr. Fry is aware.  

## 2018-06-08 NOTE — Telephone Encounter (Signed)
This encounter was created in error - please disregard.

## 2018-06-08 NOTE — Telephone Encounter (Signed)
Copied from [place] [phone]. Topic: Quick Communication - See Telephone Encounter >> Jun 08, 2018  4:32 PM Blase Mess A wrote: CRM for notification. See Telephone encounter for: 06/08/18. Patient is calling because he passed out today he was prescribed amoxicillin-clavulanate (AUGMENTIN) 875-125 MG tablet [[id number]]  yesterday. Please advise Patient is requesting a call back

## 2018-06-08 NOTE — Telephone Encounter (Signed)
Pt's wife calling to state [place]. Pt's wife states that the pt was seen in the office on yesterday and was diagnosed with walking pneumonia. Pt's wife states that the pt felt well enough today to go play golf today.Pt's wife states she is not with the pt at this time but she is on the way to get to the pt and wanted to know if she needed to call 911. Pt's wife states she was told that the pt is alert and talking at this time, but the pt's BP was 80/40 in one arm and 80/30 in the other arm. Pt advised to call 911 to transport the pt to the ED. Pt's wife verbalized understanding and states she will call.  Reason for Disposition . History of heart problems (e.g., congestive heart failure, heart attack)  Protocols used: [facility]

## 2018-06-08 NOTE — Telephone Encounter (Signed)
Pt called back with questions about his antibiotic.He was concerned that it made him pass out. Pt stated he did not go to the ER as recommended today. Stated he felt better BP is now 96/61. He is lying down and drinking water. I reinforced that he needed to go to the ER to be evaluated. Pt states he is alone and fine. He said his wife is out and will return in 1 hour. Pt states he will not go to ER without her. I advised pt to drink water try 3 glasses by the time his wife returns. Care with changes in position from lying,sittin,and standing. Again he was advised to be seen in the ER for evaluation. Pt verbalized understanding of all instructions

## 2018-06-09 ENCOUNTER — Ambulatory Visit (INDEPENDENT_AMBULATORY_CARE_PROVIDER_SITE_OTHER): Payer: Medicare Other | Admitting: Family Medicine

## 2018-06-09 ENCOUNTER — Encounter: Payer: Self-pay | Admitting: Family Medicine

## 2018-06-09 VITALS — BP 106/62 | HR 84 | Temp 98.5°F | Wt 153.5 lb

## 2018-06-09 DIAGNOSIS — J189 Pneumonia, unspecified organism: Secondary | ICD-10-CM

## 2018-06-09 MED ORDER — BENZONATATE 200 MG PO CAPS: 200.0000 mg | ORAL_CAPSULE | Freq: Two times a day (BID) | ORAL | 3 refills | Status: DC | PRN

## 2018-06-09 MED ORDER — CEFTRIAXONE SODIUM 1 G IJ SOLR
1.0000 g | Freq: Once | INTRAMUSCULAR | Status: AC
  Administered 2018-06-09: 1 g via INTRAMUSCULAR

## 2018-06-09 MED ORDER — LEVOFLOXACIN 500 MG PO TABS: 500.0000 mg | ORAL_TABLET | Freq: Every day | ORAL | 0 refills | Status: DC

## 2018-06-09 NOTE — Progress Notes (Signed)
   Subjective:    Patient ID: RUE TINNEL, male    DOB: Oct 11, 1940, 77 y.o.   MRN: [id number]  HPI Here with his wife to follow up on pneumonia and to discuss an incident that occurred yesterday around noon. He was seen here on 06-07-18 for a cough and was found to have a LLL pneumonia both on exam and on CXR. He was started on Augmentin, and he was told to take it easy. However after taking 2 doses of this yesterday morning he wen tout and played 18 holes of golf. By the time he finished he started feeling faint and very weak. He needed help from his friends to get into their car. He never lost consciousness but he was almost unresponsive at one point. He did lose control of his bowels at one point. He denies any SOB or chest pain. They called his wife and she got him home, where she watched hi closely. He was able to drink fluids. His pulse rate remained normal in the 70s (he has a pacemaker), but his BP dropped as low as 90/60. He slowly regained strength and his BP came back up to 110/70. Since then he has felt back to normal, although he still has a dry cough.    Review of Systems  Constitutional: Negative.   Respiratory: Positive for cough. Negative for chest tightness, shortness of breath and wheezing.   Cardiovascular: Negative.   Gastrointestinal: Negative.   Neurological: Negative.        Objective:   Physical Exam  Constitutional: He is oriented to person, place, and time. He appears well-developed and well-nourished.  Cardiovascular: Normal rate, regular rhythm, normal heart sounds and intact distal pulses.  Pulmonary/Chest: Effort normal. No stridor. No respiratory distress. He has no wheezes.  He now has rales at both posterior bases   Neurological: He is alert and oriented to person, place, and time.          Assessment & Plan:  He has a CAP, and he also overdid himself yesterday. He likely got overheated and dehydrated, but he seems to have recovered. Unfortunately  the CAP has worsened a bit. We will stop the Augmentin and start him on Levaquin for 10 days. Given a shot of Rocephin. He will rest, stay out of the heat, and drink fluids this weekend. Recheck on Monday. Alysia Penna, MD

## 2018-06-09 NOTE — Telephone Encounter (Signed)
Called and spoke to the patient's wife this morning.  She states BP came up to 101/67.  He is feeling better.  Advised that he should be seen by Dr. Sarajane Jews since he was not willing to go to the ER.  Pt did agree to come in and see Dr. Sarajane Jews this afternoon.  Advised them to arrive at 2:15 for a 2:30 appt.  Will forward to Dr. Sarajane Jews as Juluis Rainier.

## 2018-06-12 ENCOUNTER — Encounter: Payer: Self-pay | Admitting: Family Medicine

## 2018-06-12 ENCOUNTER — Ambulatory Visit (INDEPENDENT_AMBULATORY_CARE_PROVIDER_SITE_OTHER): Payer: Medicare Other | Admitting: Family Medicine

## 2018-06-12 VITALS — BP 100/62 | HR 81 | Temp 97.8°F | Wt 157.1 lb

## 2018-06-12 DIAGNOSIS — J181 Lobar pneumonia, unspecified organism: Secondary | ICD-10-CM | POA: Diagnosis not present

## 2018-06-12 DIAGNOSIS — J189 Pneumonia, unspecified organism: Secondary | ICD-10-CM

## 2018-06-12 NOTE — Progress Notes (Signed)
   Subjective:    Patient ID: Tanner Hensley, male    DOB: [birth year], 77 y.o.   MRN: [id number]  HPI Here to follow up on a CAP. We saw him last Friday when he had bibasilar rales on exam. He was given a Rocephin shot and we switched him from Augmentin to Levaquin. He now feels better, the diarrhea has stopped, and his appetite is picking up again. He still has a dry cough. Benzonatate helps.    Review of Systems  Constitutional: Positive for fatigue. Negative for fever.  HENT: Negative.   Eyes: Negative.   Respiratory: Positive for cough and chest tightness. Negative for wheezing.   Cardiovascular: Negative.        Objective:   Physical Exam  Constitutional: He appears well-developed and well-nourished. No distress.  HENT:  Right Ear: External ear normal.  Left Ear: External ear normal.  Nose: Nose normal.  Mouth/Throat: Oropharynx is clear and moist.  Eyes: Conjunctivae are normal.  Neck: No thyromegaly present.  Pulmonary/Chest: No stridor. He is in respiratory distress. He has no wheezes.  He has faint rales in the left posterior base but the right side has cleared   Lymphadenopathy:    He has no cervical adenopathy.          Assessment & Plan:  CAP, steadily improving. He will finish up 10 days of Levaquin. Recheck here in one week.  Alysia Penna, MD

## 2018-06-19 ENCOUNTER — Ambulatory Visit (INDEPENDENT_AMBULATORY_CARE_PROVIDER_SITE_OTHER): Payer: Medicare Other | Admitting: Family Medicine

## 2018-06-19 ENCOUNTER — Encounter: Payer: Self-pay | Admitting: Family Medicine

## 2018-06-19 ENCOUNTER — Ambulatory Visit: Payer: Medicare Other

## 2018-06-19 VITALS — BP 112/72 | HR 85 | Temp 98.4°F | Wt 157.4 lb

## 2018-06-19 DIAGNOSIS — J181 Lobar pneumonia, unspecified organism: Secondary | ICD-10-CM

## 2018-06-19 DIAGNOSIS — J189 Pneumonia, unspecified organism: Secondary | ICD-10-CM

## 2018-06-19 NOTE — Progress Notes (Signed)
   Subjective:    Patient ID: Tanner Hensley, male    DOB: Aug 25, 1941, 77 y.o.   MRN: [id number]  HPI Here to follow up on LLL pneumonia. He has completed courses of Augmentin and then Levaquin, with a shot of Rocephin in between, but he is not improving. No fever, but he still has a hard frequent cough which produces yellow sputum. No chest pain. He gets SOB with mild exertion. He feels very fatigued and now this morning he feels dizzy when he stands up. He is drinking plenty of fluids. He had a CXR on 06-07-18 showing LLL infiltrates.    Review of Systems  Constitutional: Positive for fatigue. Negative for chills, diaphoresis and fever.  Respiratory: Positive for cough and shortness of breath. Negative for wheezing.   Cardiovascular: Negative.   Neurological: Positive for dizziness.       Objective:   Physical Exam  Constitutional: He is oriented to person, place, and time.  He appears ill and is a little unsteady on his feet   Neck: No thyromegaly present.  Cardiovascular: Normal rate, regular rhythm, normal heart sounds and intact distal pulses.  Pulmonary/Chest: Effort normal. He has no wheezes.  He still has rales in the left posterior base that have not improved with treatment. He coughs frequently   Lymphadenopathy:    He has no cervical adenopathy.  Neurological: He is alert and oriented to person, place, and time.          Assessment & Plan:  LLL pneumonia that has not responded to treatment as expected. We will refer him to see Pulmonary asap.  Alysia Penna, MD

## 2018-06-20 ENCOUNTER — Encounter: Payer: Self-pay | Admitting: Internal Medicine

## 2018-06-20 ENCOUNTER — Ambulatory Visit (INDEPENDENT_AMBULATORY_CARE_PROVIDER_SITE_OTHER): Payer: Medicare Other | Admitting: Internal Medicine

## 2018-06-20 ENCOUNTER — Ambulatory Visit (INDEPENDENT_AMBULATORY_CARE_PROVIDER_SITE_OTHER)
Admission: RE | Admit: 2018-06-20 | Discharge: 2018-06-20 | Disposition: A | Discharge status: Home / Self Care | Payer: Medicare Other | Source: Ambulatory Visit | Attending: Internal Medicine | Admitting: Internal Medicine

## 2018-06-20 ENCOUNTER — Other Ambulatory Visit (INDEPENDENT_AMBULATORY_CARE_PROVIDER_SITE_OTHER): Payer: Medicare Other

## 2018-06-20 VITALS — BP 122/74 | HR 82 | Temp 98.4°F | Ht 65.0 in | Wt 159.2 lb

## 2018-06-20 DIAGNOSIS — R05 Cough: Secondary | ICD-10-CM

## 2018-06-20 DIAGNOSIS — R918 Other nonspecific abnormal finding of lung field: Secondary | ICD-10-CM | POA: Diagnosis not present

## 2018-06-20 DIAGNOSIS — I10 Essential (primary) hypertension: Secondary | ICD-10-CM | POA: Diagnosis not present

## 2018-06-20 DIAGNOSIS — R058 Other specified cough: Secondary | ICD-10-CM

## 2018-06-20 DIAGNOSIS — R059 Cough, unspecified: Secondary | ICD-10-CM

## 2018-06-20 LAB — CBC WITH DIFFERENTIAL/PLATELET
Basophils Absolute: 0.1 K/uL (ref 0.0–0.1)
Basophils Relative: 1.2 % (ref 0.0–3.0)
Eosinophils Absolute: 0.3 K/uL (ref 0.0–0.7)
Eosinophils Relative: 2.9 % (ref 0.0–5.0)
HCT: 37 % — ABNORMAL LOW (ref 39.0–52.0)
Hemoglobin: 12.6 g/dL — ABNORMAL LOW (ref 13.0–17.0)
Lymphocytes Relative: 18.4 % (ref 12.0–46.0)
Lymphs Abs: 1.7 K/uL (ref 0.7–4.0)
MCHC: 34.1 g/dL (ref 30.0–36.0)
MCV: 103.4 fl — ABNORMAL HIGH (ref 78.0–100.0)
Monocytes Absolute: 1 K/uL (ref 0.1–1.0)
Monocytes Relative: 11.1 % (ref 3.0–12.0)
Neutro Abs: 6 K/uL (ref 1.4–7.7)
Neutrophils Relative %: 66.4 % (ref 43.0–77.0)
Platelets: 151 K/uL (ref 150.0–400.0)
RBC: 3.58 Mil/uL — ABNORMAL LOW (ref 4.22–5.81)
RDW: 12.4 % (ref 11.5–15.5)
WBC: 9 K/uL (ref 4.0–10.5)

## 2018-06-20 LAB — SEDIMENTATION RATE: Sed Rate: 5 mm/h (ref 0–20)

## 2018-06-20 MED ORDER — IRBESARTAN 300 MG PO TABS: 300.0000 mg | ORAL_TABLET | Freq: Every day | ORAL | 2 refills | Status: DC

## 2018-06-20 MED ORDER — PANTOPRAZOLE SODIUM 40 MG PO TBEC: DELAYED_RELEASE_TABLET | ORAL | 2 refills | Status: DC

## 2018-06-20 MED ORDER — MEPERIDINE HCL 50 MG/5ML PO SOLN: 25.0000 mg | ORAL | 0 refills | Status: DC | PRN

## 2018-06-20 NOTE — Patient Instructions (Addendum)
Stop fosfamax, flexseed oil, and lisinopril   Start  avapro 300 mg one daily  - take only half if light headed standing   Protonix 40 mg Take 30- 60 min before your first and last meals of the day   For cough >  tessilon 200 mg up every 4 hours - if still can't stop coughing supplement with demerol a half to one  tsp every 4 hours if needed    GERD (REFLUX)  is an extremely common cause of respiratory symptoms just like yours , many times with no obvious heartburn at all.    It can be treated with medication, but also with lifestyle changes including elevation of the head of your bed (ideally with 6 inch  bed blocks),  Smoking cessation, avoidance of late meals, excessive alcohol, and avoid fatty foods, chocolate, peppermint, colas, red wine, and acidic juices such as orange juice.  NO MINT OR MENTHOL PRODUCTS SO NO COUGH DROPS   USE HARD ROCK CANDY INSTEAD (Jolley ranchers or Stover's or Life Savers) or even ice chips will also do - the key is to swallow to prevent all throat clearing. NO OIL BASED VITAMINS - use powdered substitutes.   Please see patient coordinator before you leave today  to schedule sinus CT and I will call you results   Please remember to go to the lab and x-ray department downstairs in the basement  for your tests - we will call you with the results when they are available.   Please schedule a follow up office visit in 2 weeks, sooner if needed  with all medications /inhalers/ solutions in hand so we can verify exactly what you are taking. This includes all medications from all doctors and over the counters

## 2018-06-20 NOTE — Progress Notes (Signed)
Tanner Hensley, male    DOB: 21-Jan-1941,    MRN: [id number]   Brief patient profile:  30 yowm never smoker from [place] moved to [facility] and troubles with onset watery rhinorrhea x 2016/7 year round more day than noct otcs no better then 3 weeks severe cough with yellow/green mucus  to point of presyncope / fatigue > 06/07/18 dx LLL pna > rx amox > levaquin x 10 days and cortisone shot > no better so referred to pulmonary clinic 06/20/2018 by Dr   Sharlene Motts.   History of Present Illness  06/20/2018  Pulmonary /new pt eval Tanner Hensley  Chief Complaint  Patient presents with  . Follow-up    Referred by Dr. Alysia Penna.  Pt states dxed with PNA early Oct 2019. He c/o cough and fatigue. Cough is prod with light green sputum.    Dyspnea:  Doe x 3 weeks x across the room  Cough: no change  Sleep: on bed flat/ thin pillows  SABA use: no   Tessalon helps the most with cough   Last took chronic pred x at least a year prior to OV  For pmr/ arthritis - does not recall it helping nasal symptoms or worse off it.   No obvious day to day or daytime variability or assoc mucus plugs or hemoptysis or cp or chest tightness, subjective wheeze or overt   hb symptoms.   As above  without nocturnal  or early am exacerbation  of respiratory  c/o's or need for noct saba. Also denies any obvious fluctuation of symptoms with weather or environmental changes or other aggravating or alleviating factors except as outlined above   No unusual exposure hx or h/o childhood pna/ asthma or knowledge of premature birth.  Current Allergies, Complete Past Medical History, Past Surgical History, Family History, and Social History were reviewed in [employer] record.  ROS  The following are not active complaints unless bolded Hoarseness, sore throat, dysphagia, dental problems, itching, sneezing,  nasal congestion or discharge of excess mucus or purulent secretions, ear ache,   fever, chills, sweats,  unintended wt loss or wt gain, classically pleuritic or exertional cp,  orthopnea pnd or arm/hand swelling  or leg swelling, presyncope, palpitations, abdominal pain, anorexia, nausea, vomiting, diarrhea  or change in bowel habits or change in bladder habits, change in stools or change in urine, dysuria, hematuria,  rash, arthralgias, visual complaints, headache, numbness, weakness or ataxia or problems with walking or coordination,  change in mood or  memory.              Past Medical History:  Diagnosis Date  . Arthritis    sees Dr. Charlestine Night  . Atrial fibrillation ([place])    (Dr. Phylliss Blakes at [facility] and Dr. Lovena Le at [facility]. [facility] polymyalgia rheumatica (Dr. Charlestine Night)  . CHF (congestive heart failure) (HCC)    NOT SURE WHEN  . Coronary artery disease   . Diabetes mellitus type II    DIET CONTROLLED AND LOST 40 LBS  . Dysrhythmia    chronic a=fib with cardiac ablations  . GERD (gastroesophageal reflux disease)   . Gout   . High cholesterol   . Hypertension   . Insomnia   . Memory loss   . Neuromuscular disorder ([place] [place])    POLY MYALGIA   RHEUMATICA  . Polymyalgia rheumatica ([place])    sees Dr. Charlestine Night   . Presence of permanent cardiac pacemaker    [employer]  08/2011  . Urticaria     Outpatient Medications Prior to Visit  Medication Sig Dispense Refill  . acetaminophen (TYLENOL) 500 MG tablet Take 1,000 mg by mouth at bedtime as needed.     Marland Kitchen alendronate (FOSAMAX) 70 MG tablet TAKE 1 TABLET BY MOUTH  EVERY 7 DAYS WITH FULL  GLASS OF WATER 12 tablet 3  . aspirin EC 81 MG tablet Take 81 mg by mouth daily.    Marland Kitchen atenolol (TENORMIN) 25 MG tablet Take 25 mg by mouth daily.  0  . b complex vitamins tablet Take 1 tablet by mouth daily.    . calcium citrate-vitamin D (CITRACAL+D) 315-200 MG-UNIT tablet Take 1 tablet by mouth 2 (two) times daily.    . chlorthalidone (HYGROTON) 25 MG tablet Take 25 mg by mouth daily.    . diphenhydramine-acetaminophen  (TYLENOL PM) 25-500 MG TABS tablet Take 1 tablet by mouth at bedtime as needed.    . donepezil (ARICEPT) 10 MG tablet TAKE 1 TABLET BY MOUTH AT  BEDTIME 90 tablet 3  . Flaxseed, Linseed, (FLAX SEEDS PO) Take 1 capsule by mouth.    . lactase (LACTAID) 3000 units tablet Take by mouth 3 (three) times daily with meals.    Marland Kitchen lisinopril (PRINIVIL,ZESTRIL) 40 MG tablet TAKE 1 TABLET BY MOUTH  DAILY 90 tablet 3  . memantine (NAMENDA) 10 MG tablet TAKE 1 TABLET BY MOUTH  TWICE A DAY 180 tablet 3  . Multiple Vitamin (MULTIVITAMIN WITH MINERALS) TABS tablet Take 1 tablet by mouth daily. SENIOR MULTIVITAMIN    . vitamin C (ASCORBIC ACID) 500 MG tablet Take 500 mg by mouth daily. During the winter    . vitamin E 400 UNIT capsule Take 400 Units by mouth daily.    Marland Kitchen warfarin (COUMADIN) 5 MG tablet TAKE 1 TABLET BY MOUTH  DAILY 90 tablet 1  . benzonatate (TESSALON) 200 MG capsule Take 1 capsule (200 mg total) by mouth 2 (two) times daily as needed for cough. 12 capsule 3           Objective:     BP 122/74 (BP Location: Left Arm, Cuff Size: Normal)   Pulse 82   Temp 98.4 F (36.9 C) (Oral)   Ht 5\' 5"  (1.651 m)   Wt 159 lb 3.2 oz (72.2 kg)   SpO2 97%   BMI 26.49 kg/m   SpO2: 97 %  RA      HEENT: nl  turbinates bilaterally, and oropharynx. Nl external ear canals without cough reflex - Top Dentures    NECK :  without JVD/Nodes/TM/ nl carotid upstrokes bilaterally   LUNGS: no acc muscle use,  Nl contour chest which is clear to A and P bilaterally without cough on insp or exp maneuvers   CV:  RRR  no s3 or murmur or increase in P2, and no edema   ABD:  soft and nontender with nl inspiratory excursion in the supine position. No bruits or organomegaly appreciated, bowel sounds nl  MS:  Nl gait/ ext warm without deformities, calf tenderness, cyanosis or clubbing No obvious joint restrictions   SKIN: warm and dry without lesions    NEURO:  alert, approp, nl sensorium with  no motor or  cerebellar deficits apparent.     CXR PA and Lateral:   06/20/2018 :    I personally reviewed images  - impression as follows:   LLL as dz resolved, min non-specific increased markings    Labs ordered/ reviewed:   Lab  Results  Component Value Date   WBC 9.0 06/20/2018   HGB 12.6 (L) 06/20/2018   HCT 37.0 (L) 06/20/2018   MCV 103.4 (H) 06/20/2018   PLT 151.0 06/20/2018       EOS                                                               0.3                                     06/20/2018      Lab Results  Component Value Date   ESRSEDRATE 5 06/20/2018          Assessment   Pulmonary infiltrates on CXR See cxr 06/07/18 > resolved 06/20/2018 p rx with levaquin   He still reports discolored sputum raising concern with sinusitis/ bronchiectasis so first step is sinus CT then consider hrct chest if symptoms remain refractory but today's chest exam is completely clear so suspect the problem at this point is upper airway (see separate a/p)   Upper airway cough syndrome D/c acei 06/20/2018  - Allergy profile 06/20/2018 >  Eos 0.2 /  IgE  Pending  - sinus CT ordered    Upper airway cough syndrome (previously labeled PNDS),  is so named because it's frequently impossible to sort out how much is  CR/sinusitis with freq throat clearing (which can be related to primary GERD)   vs  causing  secondary (" extra esophageal")  GERD from wide swings in gastric pressure that occur with throat clearing, often  promoting self use of mint and menthol lozenges that reduce the lower esophageal sphincter tone and exacerbate the problem further in a cyclical fashion.   These are the same pts (now being labeled as having "irritable larynx syndrome" by some cough centers) who not infrequently have a history of having failed to tolerate ace inhibitors,  dry powder inhalers or biphosphonates or report having atypical/extraesophageal reflux symptoms that don't respond to standard doses of PPI  and are  easily confused as having aecopd or asthma flares by even experienced allergists/ pulmonologists (myself included).    Of the three most common causes of  Sub-acute / recurrent or chronic cough, only one (GERD)  can actually contribute to/ trigger  the other two (asthma and post nasal drip syndrome)  and perpetuate the cylce of cough.  While not intuitively obvious, many patients with chronic low grade reflux do not cough until there is a primary insult that disturbs the protective epithelial barrier and exposes sensitive nerve endings.   This is typically viral but can due to PNDS and  either may apply here.    >>>  The point is that once this occurs, it is difficult to eliminate the cycle  using anything but a maximally effective acid suppression regimen at least in the short run, accompanied by an appropriate diet to address non acid GERD and control / eliminate the cough itself for at least 3 days with tessalon and if not effective add low dose demerol as he is allergic to other opioids while off acei and fosfamax for now  Essential hypertension In the best review of chronic cough to date ( NEJM  2016 [id number]) ,  ACEi are now felt to cause cough in up to  20% of pts which is a 4 fold increase from previous reports and does not include the variety of non-specific complaints we see in pulmonary clinic in pts on ACEi but previously attributed to another dx like  Copd/asthma and  include PNDS, throat and chest congestion, "bronchitis", unexplained dyspnea and noct "strangling" sensations, and hoarseness, but also  atypical /refractory GERD symptoms like dysphagia and "bad heartburn"   The only way I know  to prove this is not an "ACEi Case" is a trial off ACEi x a minimum of 6 weeks then regroup.   Try avapro 300 mg daily,  Cautioned to break in half if light headed standing and just take 150 mg daily     Total time devoted to counseling  > 50 % of initial 60 min office visit:  review case  with pt/wife discussion of options/alternatives/ personally creating written customized instructions  in presence of pt  then going over those specific  Instructions directly with the pt including how to use all of the meds but in particular covering each new medication in detail and the difference between the maintenance= "automatic" meds and the prns using an action plan format for the latter (If this problem/symptom => do that organization reading Left to right).  Please see AVS from this visit for a full list of these instructions which I personally wrote for this pt and  are unique to this visit.      Christinia Gully, MD 06/20/2018

## 2018-06-21 ENCOUNTER — Telehealth: Payer: Self-pay | Admitting: Internal Medicine

## 2018-06-21 ENCOUNTER — Encounter: Payer: Self-pay | Admitting: Internal Medicine

## 2018-06-21 DIAGNOSIS — R918 Other nonspecific abnormal finding of lung field: Secondary | ICD-10-CM | POA: Insufficient documentation

## 2018-06-21 LAB — RESPIRATORY ALLERGY PROFILE REGION II ~~LOC~~
Allergen, Cottonwood, t14: <0.1 kU/L
Allergen, D pternoyssinus,d7: <0.1 kU/L
Allergen, Mouse Urine Protein, e78: <0.1 kU/L
Allergen, Oak,t7: <0.1 kU/L
Allergen, P. notatum, m1: <0.1 kU/L
Bermuda Grass: <0.1 kU/L
Box Elder IgE: <0.1 kU/L
CLASS: 0
CLASS: 0
CLASS: 0
CLASS: 0
CLASS: 0
CLASS: 0
CLASS: 0
CLASS: 0
CLASS: 0
CLASS: 0
CLASS: 0
CLASS: 0
CLASS: 0
CLASS: 0
COMMON RAGWEED (SHORT) (W1) IGE: <0.1 kU/L
Class: 0
Class: 0
Class: 0
Class: 0
Class: 0
Class: 0
Class: 0
Class: 0
Class: 0
Class: 0
Cockroach: <0.1 kU/L
Dog Dander: <0.1 kU/L
Elm IgE: <0.1 kU/L
IgE (Immunoglobulin E), Serum: 4 kU/L (ref <=114)
Johnson Grass: <0.1 kU/L
Pecan/Hickory Tree IgE: <0.1 kU/L
Sheep Sorrel IgE: <0.1 kU/L
Timothy Grass: <0.1 kU/L

## 2018-06-21 LAB — INTERPRETATION:

## 2018-06-21 MED ORDER — BENZONATATE 200 MG PO CAPS: 200.0000 mg | ORAL_CAPSULE | ORAL | 0 refills | Status: DC | PRN

## 2018-06-21 MED ORDER — OLMESARTAN MEDOXOMIL 40 MG PO TABS: 40.0000 mg | ORAL_TABLET | Freq: Every day | ORAL | 2 refills | Status: DC

## 2018-06-21 NOTE — Telephone Encounter (Signed)
Called and spoke with pt letting him know we were changing the irbesartan to benicar.  Pt asked me if I could call his wife due to her being out. I tried to call pt's wife Edmonia Lynch but unable to reach her.  Left a VM for her to return call. An Rx of tessalon and also Rx of benicar has been sent to pt's preferred pharmacy and I have also removed the irbesartan off of pt's med list.  Will await a call from pt's wife so we can tell her this information.

## 2018-06-21 NOTE — Progress Notes (Signed)
Spoke with pt and notified of results per Dr. Wert. Pt verbalized understanding and denied any questions. 

## 2018-06-21 NOTE — Assessment & Plan Note (Signed)
D/c acei 06/20/2018  - Allergy profile 06/20/2018 >  Eos 0.2 /  IgE  Pending  - sinus CT ordered    Upper airway cough syndrome (previously labeled PNDS),  is so named because it's frequently impossible to sort out how much is  CR/sinusitis with freq throat clearing (which can be related to primary GERD)   vs  causing  secondary (" extra esophageal")  GERD from wide swings in gastric pressure that occur with throat clearing, often  promoting self use of mint and menthol lozenges that reduce the lower esophageal sphincter tone and exacerbate the problem further in a cyclical fashion.   These are the same pts (now being labeled as having "irritable larynx syndrome" by some cough centers) who not infrequently have a history of having failed to tolerate ace inhibitors,  dry powder inhalers or biphosphonates or report having atypical/extraesophageal reflux symptoms that don't respond to standard doses of PPI  and are easily confused as having aecopd or asthma flares by even experienced allergists/ pulmonologists (myself included).    Of the three most common causes of  Sub-acute / recurrent or chronic cough, only one (GERD)  can actually contribute to/ trigger  the other two (asthma and post nasal drip syndrome)  and perpetuate the cylce of cough.  While not intuitively obvious, many patients with chronic low grade reflux do not cough until there is a primary insult that disturbs the protective epithelial barrier and exposes sensitive nerve endings.   This is typically viral but can due to PNDS and  either may apply here.    >>>  The point is that once this occurs, it is difficult to eliminate the cycle  using anything but a maximally effective acid suppression regimen at least in the short run, accompanied by an appropriate diet to address non acid GERD and control / eliminate the cough itself for at least 3 days with tessalon and if not effective add low dose demerol as he is allergic to other opioids  while off acei and fosfamax for now

## 2018-06-21 NOTE — Telephone Encounter (Signed)
Pt's wife Edmonia Lynch returned call and I stated to her that the meds had been sent to pt's pharmacy.  [name] expressed understanding. Nothing further needed.

## 2018-06-21 NOTE — Telephone Encounter (Signed)
benicar 40 mg qd   If not available then micardis 80 mg qd

## 2018-06-21 NOTE — Assessment & Plan Note (Signed)
In the best review of chronic cough to date ( NEJM 2016 375 [phone]) ,  ACEi are now felt to cause cough in up to  20% of pts which is a 4 fold increase from previous reports and does not include the variety of non-specific complaints we see in pulmonary clinic in pts on ACEi but previously attributed to another dx like  Copd/asthma and  include PNDS, throat and chest congestion, "bronchitis", unexplained dyspnea and noct "strangling" sensations, and hoarseness, but also  atypical /refractory GERD symptoms like dysphagia and "bad heartburn"   The only way I know  to prove this is not an "ACEi Case" is a trial off ACEi x a minimum of 6 weeks then regroup.   Try avapro 300 mg daily,  Cautioned to break in half if light headed standing and just take 150 mg daily     Total time devoted to counseling  > 50 % of initial 60 min office visit:  review case with pt/wife discussion of options/alternatives/ personally creating written customized instructions  in presence of pt  then going over those specific  Instructions directly with the pt including how to use all of the meds but in particular covering each new medication in detail and the difference between the maintenance= "automatic" meds and the prns using an action plan format for the latter (If this problem/symptom => do that organization reading Left to right).  Please see AVS from this visit for a full list of these instructions which I personally wrote for this pt and  are unique to this visit.

## 2018-06-21 NOTE — Telephone Encounter (Signed)
Spoke with the pt's spouse  She states that the irbesartan is on back order  They are requesting alternative  They also requested a new rx for the tessalon 200 mg and this was sent  Please advise on alternative for irbesartan     Stop fosfamax, flexseed oil, and lisinopril    Start  avapro 300 mg one daily  - take only half if light headed standing    Protonix 40 mg Take 30- 60 min before your first and last meals of the day    For cough >  tessilon 200 mg up every 4 hours - if still can't stop coughing supplement with demerol a half to one  tsp every 4 hours if needed      GERD (REFLUX)  is an extremely common cause of respiratory symptoms just like yours , many times with no obvious heartburn at all.     It can be treated with medication, but also with lifestyle changes including elevation of the head of your bed (ideally with 6 inch  bed blocks),  Smoking cessation, avoidance of late meals, excessive alcohol, and avoid fatty foods, chocolate, peppermint, colas, red wine, and acidic juices such as orange juice.  NO MINT OR MENTHOL PRODUCTS SO NO COUGH DROPS   USE HARD ROCK CANDY INSTEAD (Jolley ranchers or Stover's or Life Savers) or even ice chips will also do - the key is to swallow to prevent all throat clearing. NO OIL BASED VITAMINS - use powdered substitutes.     Please see patient coordinator before you leave today  to schedule sinus CT and I will call you results    Please remember to go to the lab and x-ray department downstairs in the basement  for your tests - we will call you with the results when they are available

## 2018-06-21 NOTE — Assessment & Plan Note (Signed)
See cxr 06/07/18 > resolved 06/20/2018 p rx with levaquin   He still reports discolored sputum raising concern with sinusitis/ bronchiectasis so first step is sinus CT then consider hrct chest if symptoms remain refractory but today's chest exam is completely clear so suspect the problem at this point is upper airway (see separate a/p)

## 2018-06-22 NOTE — Progress Notes (Signed)
Left detailed msg with results

## 2018-06-26 ENCOUNTER — Ambulatory Visit: Payer: Self-pay | Admitting: *Deleted

## 2018-06-26 DIAGNOSIS — R197 Diarrhea, unspecified: Secondary | ICD-10-CM | POA: Diagnosis not present

## 2018-06-26 DIAGNOSIS — E860 Dehydration: Secondary | ICD-10-CM | POA: Diagnosis not present

## 2018-06-26 DIAGNOSIS — Z950 Presence of cardiac pacemaker: Secondary | ICD-10-CM | POA: Diagnosis not present

## 2018-06-26 DIAGNOSIS — R531 Weakness: Secondary | ICD-10-CM | POA: Diagnosis not present

## 2018-06-26 DIAGNOSIS — R55 Syncope and collapse: Secondary | ICD-10-CM | POA: Diagnosis not present

## 2018-06-26 NOTE — Telephone Encounter (Addendum)
Patient's wife is calling to report that her husband almost fainted in the bathroom this morning. He had blood pressure of 93/67. She reports he has had diarrhea for 3 weeks now and she wants to let Dr Sarajane Jews know that the pulmonologist has changed some of his medications.  Worked patient through protocol and he is at severe risk of dehydration. Current BP is 86/46 P 76. Call to office- [facility] and PCP agree patient need to go to ED- wife informed of instructions from provider- they will go.   Reason for Disposition . [1] Drinking very little AND [2] dehydration suspected (e.g., no urine > 12 hours, very dry mouth, very lightheaded)  Answer Assessment - Initial Assessment Questions 1. DIARRHEA SEVERITY: "How bad is the diarrhea?" "How many extra stools have you had in the past 24 hours than normal?"    - NO DIARRHEA (SCALE 0)   - MILD (SCALE 1-3): Few loose or mushy BMs; increase of 1-3 stools over normal daily number of stools; mild increase in ostomy output.   -  MODERATE (SCALE 4-7): Increase of 4-6 stools daily over normal; moderate increase in ostomy output. * SEVERE (SCALE 8-10; OR 'WORST POSSIBLE'): Increase of 7 or more stools daily over normal; moderate increase in ostomy output; incontinence.     7-8 times daily- not much- small amounts 2. ONSET: "When did the diarrhea begin?"      3 weeks 3. BM CONSISTENCY: "How loose or watery is the diarrhea?"      watery 4. VOMITING: "Are you also vomiting?" If so, ask: "How many times in the past 24 hours?"      no 5. ABDOMINAL PAIN: "Are you having any abdominal pain?" If yes: "What does it feel like?" (e.g., crampy, dull, intermittent, constant)      no 6. ABDOMINAL PAIN SEVERITY: If present, ask: "How bad is the pain?"  (e.g., Scale 1-10; mild, moderate, or severe)   - MILD (1-3): doesn't interfere with normal activities, abdomen soft and not tender to touch    - MODERATE (4-7): interferes with normal activities or awakens from sleep, tender to touch     - SEVERE (8-10): excruciating pain, doubled over, unable to do any normal activities       n/a 7. ORAL INTAKE: If vomiting, "Have you been able to drink liquids?" "How much fluids have you had in the past 24 hours?"     n/a 8. HYDRATION: "Any signs of dehydration?" (e.g., dry mouth [not just dry lips], too weak to stand, dizziness, new weight loss) "When did you last urinate?"     Drinking- 4-5 16oz-24 hours, dizzy and faint this morning- about fell in bathroom, BP 93/67 this morning     86/46 P 76 now 9. EXPOSURE: "Have you traveled to a foreign country recently?" "Have you been exposed to anyone with diarrhea?" "Could you have eaten any food that was spoiled?"     n/a 10. ANTIBIOTIC USE: "Are you taking antibiotics now or have you taken antibiotics in the past 2 months?"       Off antibiotic now- was on antibiotics for 2 weeks  11. OTHER SYMPTOMS: "Do you have any other symptoms?" (e.g., fever, blood in stool)       no 12. PREGNANCY: "Is there any chance you are pregnant?" "When was your last menstrual period?"       n/a  Protocols used: DIARRHEA-A-AH

## 2018-06-27 ENCOUNTER — Inpatient Hospital Stay: Admission: RE | Admit: 2018-06-27 | Payer: Medicare Other | Source: Ambulatory Visit

## 2018-06-27 ENCOUNTER — Telehealth: Payer: Self-pay

## 2018-06-27 MED ORDER — VANCOMYCIN HCL 250 MG PO CAPS: 250.0000 mg | ORAL_CAPSULE | Freq: Four times a day (QID) | ORAL | 0 refills | Status: DC

## 2018-06-27 NOTE — Telephone Encounter (Signed)
Called and spoke with pts wife and she is aware of meds that have been sent to the pharmacy for the pt.  appt has been rescheduled for the pt for next week.

## 2018-06-27 NOTE — Telephone Encounter (Signed)
Pt's spouse called back in to make provider aware that pt's results are back from the hospital and he is positive for C-Diff, they were advised that PCP may want to start pt on antibiotic as soon as possible. Spouse would like to know if they should still come in for ov tomorrow?     Pharmacy:  [id number] - [place], [place] [phone] (Phone) [phone] (Fax)

## 2018-06-27 NOTE — Telephone Encounter (Signed)
Pt seen at [facility] ED.

## 2018-06-27 NOTE — Telephone Encounter (Signed)
Spoke with pt's wife and gave precautions for care of C-Diff pt. She voiced understanding. Advised her waiting on Dr. Barbie Banner recommendations.

## 2018-06-27 NOTE — Telephone Encounter (Signed)
Call in Vancomycin 250 mg capsules to take QID, #40. Have him see me next week

## 2018-06-27 NOTE — Telephone Encounter (Signed)
Copied from [place] [id number]. Topic: General - Other >> Jun 27, 2018 10:41 AM Carolyn Stare wrote:  Pt wife is asking if pt need to be seen today by Dr Sarajane Jews pt went to ER and he is still having diarrhea that is liquid . Pt has had it for 3 weeks per wife   Spoke with pt's wife (DPR) and offered appt today with another provider or tomorrow with Sarajane Jews. She said they will take one tomorrow with Sarajane Jews. Also offered recommendations on diet and hydration, pedialyte/fiber powder/BRAT diet foods. Advised if symptoms worsen or pt develops extreme pain/fever take pt to ED. She voiced understanding to all.   Dr. Sarajane Jews - FYI. Thanks!

## 2018-06-28 ENCOUNTER — Ambulatory Visit: Payer: Medicare Other | Admitting: Family Medicine

## 2018-06-30 ENCOUNTER — Inpatient Hospital Stay: Admission: RE | Admit: 2018-06-30 | Payer: Medicare Other | Source: Ambulatory Visit

## 2018-07-04 ENCOUNTER — Ambulatory Visit: Payer: Medicare Other | Admitting: Internal Medicine

## 2018-07-04 ENCOUNTER — Inpatient Hospital Stay: Payer: Medicare Other | Admitting: Family Medicine

## 2018-07-07 ENCOUNTER — Ambulatory Visit (INDEPENDENT_AMBULATORY_CARE_PROVIDER_SITE_OTHER): Payer: Medicare Other | Admitting: Family Medicine

## 2018-07-07 ENCOUNTER — Encounter: Payer: Self-pay | Admitting: Family Medicine

## 2018-07-07 VITALS — BP 92/64 | HR 78 | Temp 97.9°F | Wt 154.1 lb

## 2018-07-07 DIAGNOSIS — J189 Pneumonia, unspecified organism: Secondary | ICD-10-CM

## 2018-07-07 DIAGNOSIS — A0472 Enterocolitis due to Clostridium difficile, not specified as recurrent: Secondary | ICD-10-CM | POA: Diagnosis not present

## 2018-07-07 DIAGNOSIS — J181 Lobar pneumonia, unspecified organism: Secondary | ICD-10-CM | POA: Diagnosis not present

## 2018-07-07 NOTE — Progress Notes (Signed)
   Subjective:    Patient ID: Tanner Hensley, male    DOB: 1941/02/09, 77 y.o.   MRN: [id number]  HPI Here to follow up a LLL pneumonia and a C diff colitis. He saw Dr. Melvyn Novas in Pulmonary on 06-20-18 and his chest was clear on exam. A CXR showed the pneumonia had resolved. In the middle of this he also developed a profuse watery diarrhea, and he tested positive for C diff. He was started on a 10 day course of Vancomycin, which he finished this morning. His stools have become formed and are now soft and mushy, and his stool frequency is down to 3 a day. His appetite is returning, and he feels stronger. He has advanced his diet to baked chicken, rice , bananas, and potatoes. He is taking Florastor (a probiotic) daily and drinking Pedialyte.    Review of Systems  Constitutional: Negative.   Respiratory: Negative.   Cardiovascular: Negative.   Gastrointestinal: Negative.   Neurological: Negative.        Objective:   Physical Exam  Constitutional: He is oriented to person, place, and time. He appears well-developed and well-nourished.  He appears much better today   Cardiovascular: Normal rate, regular rhythm, normal heart sounds and intact distal pulses.  Pulmonary/Chest: Effort normal and breath sounds normal. No stridor. No respiratory distress. He has no wheezes. He has no rales.  Abdominal: Soft. Bowel sounds are normal. He exhibits no distension and no mass. There is no tenderness. There is no rebound and no guarding. No hernia.  Neurological: He is alert and oriented to person, place, and time.          Assessment & Plan:  The CAP has resolved and the C diff infection is under control. He will advance his diet as tolerated. Recheck prn.  Alysia Penna, MD

## 2018-07-10 DIAGNOSIS — I6782 Cerebral ischemia: Secondary | ICD-10-CM | POA: Diagnosis not present

## 2018-07-10 DIAGNOSIS — S8992XA Unspecified injury of left lower leg, initial encounter: Secondary | ICD-10-CM | POA: Diagnosis not present

## 2018-07-10 DIAGNOSIS — I951 Orthostatic hypotension: Secondary | ICD-10-CM | POA: Diagnosis not present

## 2018-07-10 DIAGNOSIS — E785 Hyperlipidemia, unspecified: Secondary | ICD-10-CM | POA: Diagnosis not present

## 2018-07-10 DIAGNOSIS — I959 Hypotension, unspecified: Secondary | ICD-10-CM | POA: Diagnosis not present

## 2018-07-10 DIAGNOSIS — M25562 Pain in left knee: Secondary | ICD-10-CM | POA: Diagnosis not present

## 2018-07-10 DIAGNOSIS — R197 Diarrhea, unspecified: Secondary | ICD-10-CM | POA: Diagnosis not present

## 2018-07-10 DIAGNOSIS — Z950 Presence of cardiac pacemaker: Secondary | ICD-10-CM | POA: Diagnosis not present

## 2018-07-10 DIAGNOSIS — I4821 Permanent atrial fibrillation: Secondary | ICD-10-CM | POA: Diagnosis not present

## 2018-07-10 DIAGNOSIS — M109 Gout, unspecified: Secondary | ICD-10-CM | POA: Diagnosis not present

## 2018-07-10 DIAGNOSIS — M1712 Unilateral primary osteoarthritis, left knee: Secondary | ICD-10-CM | POA: Diagnosis not present

## 2018-07-10 DIAGNOSIS — Z885 Allergy status to narcotic agent status: Secondary | ICD-10-CM | POA: Diagnosis not present

## 2018-07-10 DIAGNOSIS — Z955 Presence of coronary angioplasty implant and graft: Secondary | ICD-10-CM | POA: Diagnosis not present

## 2018-07-10 DIAGNOSIS — R42 Dizziness and giddiness: Secondary | ICD-10-CM | POA: Diagnosis not present

## 2018-07-10 DIAGNOSIS — Z7901 Long term (current) use of anticoagulants: Secondary | ICD-10-CM | POA: Diagnosis not present

## 2018-07-10 DIAGNOSIS — Z7982 Long term (current) use of aspirin: Secondary | ICD-10-CM | POA: Diagnosis not present

## 2018-07-10 DIAGNOSIS — I2510 Atherosclerotic heart disease of native coronary artery without angina pectoris: Secondary | ICD-10-CM | POA: Diagnosis not present

## 2018-07-10 DIAGNOSIS — I6521 Occlusion and stenosis of right carotid artery: Secondary | ICD-10-CM | POA: Diagnosis not present

## 2018-07-10 DIAGNOSIS — Z79899 Other long term (current) drug therapy: Secondary | ICD-10-CM | POA: Diagnosis not present

## 2018-07-10 DIAGNOSIS — E860 Dehydration: Secondary | ICD-10-CM | POA: Diagnosis not present

## 2018-07-10 DIAGNOSIS — A0472 Enterocolitis due to Clostridium difficile, not specified as recurrent: Secondary | ICD-10-CM | POA: Diagnosis not present

## 2018-07-10 DIAGNOSIS — R531 Weakness: Secondary | ICD-10-CM | POA: Diagnosis not present

## 2018-07-10 DIAGNOSIS — R55 Syncope and collapse: Secondary | ICD-10-CM | POA: Diagnosis not present

## 2018-07-10 DIAGNOSIS — I509 Heart failure, unspecified: Secondary | ICD-10-CM | POA: Diagnosis not present

## 2018-07-10 DIAGNOSIS — I110 Hypertensive heart disease with heart failure: Secondary | ICD-10-CM | POA: Diagnosis not present

## 2018-07-10 DIAGNOSIS — I10 Essential (primary) hypertension: Secondary | ICD-10-CM | POA: Diagnosis not present

## 2018-07-10 DIAGNOSIS — Z888 Allergy status to other drugs, medicaments and biological substances status: Secondary | ICD-10-CM | POA: Diagnosis not present

## 2018-07-12 ENCOUNTER — Ambulatory Visit: Payer: Self-pay

## 2018-07-12 ENCOUNTER — Inpatient Hospital Stay: Payer: Medicare Other | Admitting: Family Medicine

## 2018-07-12 NOTE — Telephone Encounter (Signed)
Spoke with pt's wife and EMS is at home now and will be transporting pt to [facility] ED for evaluation. They were unable to obtain pt's BP at time of arrival to home.   Dr. Sarajane Jews - FYI. Thanks!

## 2018-07-12 NOTE — Telephone Encounter (Addendum)
Patient's wife called in and says "my husband's been dealing with diarrhea now for several weeks. He was in the ED for diarrhea and was told it was C-Diff. He followed up with Dr. Sarajane Jews last Friday and was told to do a Brat diet and take probiotics. He was doing better, the diarrhea had slowed to mush. He passed out and hurt his knee on Monday. He was evaluated in the ED and the doctor said to adjust his BP medications. His BP today is 90/43. He got up and went to the bathroom and the diarrhea is getting worse again, running like a water faucet, about 4-5 times today. He was so weak walking back, his mouth is white. He has an appointment today with Dr. Sarajane Jews, but he is too weak to come to the appointment. I had to cancel one he had today with the orthopedic doctor." I asked is he drinking and voiding, she says "he's voiding and drinking, but probably not as much as he should be. When he gets up, I'm afraid he will pass out again. He's so weak looking." I advised to call 911 to take him to the ED. She says "again, he was just there." I asked again is he able to come into the office today, she says "I can barely get him to the bathroom." I advised that with his BP low and him being so weak, that he will need to go back to the ED for evaluation. She agrees and says "I want them to admit him, because he's just so weak." I advised to request the admission and to let them know you are going around and around the same thing and what is being done at home is not helping, she verbalized understanding. She says "please let Dr. Sarajane Jews know about everything."   Reason for Disposition . Patient sounds very sick or weak to the triager  Answer Assessment - Initial Assessment Questions 1. DIARRHEA SEVERITY: "How bad is the diarrhea?" "How many extra stools have you had in the past 24 hours than normal?"    - NO DIARRHEA (SCALE 0)   - MILD (SCALE 1-3): Few loose or mushy BMs; increase of 1-3 stools over normal daily number of  stools; mild increase in ostomy output.   -  MODERATE (SCALE 4-7): Increase of 4-6 stools daily over normal; moderate increase in ostomy output. * SEVERE (SCALE 8-10; OR 'WORST POSSIBLE'): Increase of 7 or more stools daily over normal; moderate increase in ostomy output; incontinence.     4-5 2. ONSET: "When did the diarrhea begin?"      Ongoing, but is started up again 3. BM CONSISTENCY: "How loose or watery is the diarrhea?"      Watery 4. VOMITING: "Are you also vomiting?" If so, ask: "How many times in the past 24 hours?"      No 5. ABDOMINAL PAIN: "Are you having any abdominal pain?" If yes: "What does it feel like?" (e.g., crampy, dull, intermittent, constant)      No 6. ABDOMINAL PAIN SEVERITY: If present, ask: "How bad is the pain?"  (e.g., Scale 1-10; mild, moderate, or severe)   - MILD (1-3): doesn't interfere with normal activities, abdomen soft and not tender to touch    - MODERATE (4-7): interferes with normal activities or awakens from sleep, tender to touch    - SEVERE (8-10): excruciating pain, doubled over, unable to do any normal activities       N/A 7. ORAL INTAKE: If vomiting, "  Have you been able to drink liquids?" "How much fluids have you had in the past 24 hours?"     No vomiting; patient taking in liquids, but not as much 8. HYDRATION: "Any signs of dehydration?" (e.g., dry mouth [not just dry lips], too weak to stand, dizziness, new weight loss) "When did you last urinate?"     Lips white, too weak to stand, dizziness, low BP 90/43 9. EXPOSURE: "Have you traveled to a foreign country recently?" "Have you been exposed to anyone with diarrhea?" "Could you have eaten any food that was spoiled?"     No 10. ANTIBIOTIC USE: "Are you taking antibiotics now or have you taken antibiotics in the past 2 months?"       Yes for C-Diff 11. OTHER SYMPTOMS: "Do you have any other symptoms?" (e.g., fever, blood in stool)       No 12. PREGNANCY: "Is there any chance you are  pregnant?" "When was your last menstrual period?"       N/A  Protocols used: DIARRHEA-A-AH

## 2018-07-13 MED ORDER — VANCOMYCIN HCL 125 MG PO CAPS
125.00 | ORAL_CAPSULE | ORAL | Status: DC
Start: 2018-07-15 — End: 2018-07-13

## 2018-07-13 MED ORDER — MEMANTINE HCL 10 MG PO TABS
10.00 | ORAL_TABLET | ORAL | Status: DC
Start: 2018-07-15 — End: 2018-07-13

## 2018-07-13 MED ORDER — WARFARIN SODIUM 1 MG PO TABS
ORAL_TABLET | ORAL | Status: DC
Start: ? — End: 2018-07-13

## 2018-07-13 MED ORDER — ASPIRIN EC 81 MG PO TBEC
81.00 | DELAYED_RELEASE_TABLET | ORAL | Status: DC
Start: 2018-07-16 — End: 2018-07-13

## 2018-07-13 MED ORDER — ATENOLOL 25 MG PO TABS
25.00 | ORAL_TABLET | ORAL | Status: DC
Start: 2018-07-16 — End: 2018-07-13

## 2018-07-13 MED ORDER — ACETAMINOPHEN 325 MG PO TABS
650.00 | ORAL_TABLET | ORAL | Status: DC
Start: ? — End: 2018-07-13

## 2018-07-13 MED ORDER — SODIUM CHLORIDE 0.9 % IV SOLN
75.00 | INTRAVENOUS | Status: DC
Start: ? — End: 2018-07-13

## 2018-07-13 MED ORDER — GENERIC EXTERNAL MEDICATION
650.00 | Status: DC
Start: ? — End: 2018-07-13

## 2018-07-13 MED ORDER — LOSARTAN POTASSIUM 100 MG PO TABS
100.00 | ORAL_TABLET | ORAL | Status: DC
Start: 2018-07-16 — End: 2018-07-13

## 2018-07-13 MED ORDER — DICLOFENAC SODIUM 1 % TD GEL
2.00 | TRANSDERMAL | Status: DC
Start: ? — End: 2018-07-13

## 2018-07-13 MED ORDER — NITROGLYCERIN 0.4 MG SL SUBL
0.40 | SUBLINGUAL_TABLET | SUBLINGUAL | Status: DC
Start: ? — End: 2018-07-13

## 2018-07-13 MED ORDER — DONEPEZIL HCL 5 MG PO TABS
10.00 | ORAL_TABLET | ORAL | Status: DC
Start: 2018-07-15 — End: 2018-07-13

## 2018-07-17 ENCOUNTER — Telehealth: Payer: Self-pay | Admitting: Family Medicine

## 2018-07-17 MED ORDER — WARFARIN SODIUM 5 MG PO TABS
5.00 | ORAL_TABLET | ORAL | Status: DC
Start: ? — End: 2018-07-17

## 2018-07-17 NOTE — Telephone Encounter (Signed)
Copied from [place] [id number]. Topic: Quick Communication - See Telephone Encounter >> Jul 17, 2018  4:22 PM Ivar Drape wrote: CRM for notification. See Telephone encounter for: 07/17/18. Darlina Guys w/[facility] [phone] wanted the provider to know they will be going out to the patient on 07/20/18 to start home health physical therapy.

## 2018-07-18 NOTE — Telephone Encounter (Signed)
FYI for Dr. Sarajane Jews about home therapy.  Thanks

## 2018-07-19 NOTE — Telephone Encounter (Signed)
Copied from [place] [phone]. Topic: Quick Communication - See Telephone Encounter >> Jul 19, 2018 11:37 AM Ahmed Prima L wrote: CRM for notification. See Telephone encounter for: 07/19/18.  Darlina Guys, RN with [facility] called and stated that his wife would like for them to start services on 11/16, please let her know if that will be okay [phone]

## 2018-07-19 NOTE — Telephone Encounter (Signed)
Dr Fry please advise. thanks 

## 2018-07-21 NOTE — Telephone Encounter (Signed)
Please okay these orders  ?

## 2018-07-22 DIAGNOSIS — Z7901 Long term (current) use of anticoagulants: Secondary | ICD-10-CM | POA: Diagnosis not present

## 2018-07-22 DIAGNOSIS — Z5181 Encounter for therapeutic drug level monitoring: Secondary | ICD-10-CM | POA: Diagnosis not present

## 2018-07-22 DIAGNOSIS — I2510 Atherosclerotic heart disease of native coronary artery without angina pectoris: Secondary | ICD-10-CM | POA: Diagnosis not present

## 2018-07-22 DIAGNOSIS — M109 Gout, unspecified: Secondary | ICD-10-CM | POA: Diagnosis not present

## 2018-07-22 DIAGNOSIS — Z955 Presence of coronary angioplasty implant and graft: Secondary | ICD-10-CM | POA: Diagnosis not present

## 2018-07-22 DIAGNOSIS — A0472 Enterocolitis due to Clostridium difficile, not specified as recurrent: Secondary | ICD-10-CM | POA: Diagnosis not present

## 2018-07-22 DIAGNOSIS — E785 Hyperlipidemia, unspecified: Secondary | ICD-10-CM | POA: Diagnosis not present

## 2018-07-22 DIAGNOSIS — Z7982 Long term (current) use of aspirin: Secondary | ICD-10-CM | POA: Diagnosis not present

## 2018-07-22 DIAGNOSIS — M353 Polymyalgia rheumatica: Secondary | ICD-10-CM | POA: Diagnosis not present

## 2018-07-22 DIAGNOSIS — I509 Heart failure, unspecified: Secondary | ICD-10-CM | POA: Diagnosis not present

## 2018-07-22 DIAGNOSIS — Z9181 History of falling: Secondary | ICD-10-CM | POA: Diagnosis not present

## 2018-07-22 DIAGNOSIS — I4821 Permanent atrial fibrillation: Secondary | ICD-10-CM | POA: Diagnosis not present

## 2018-07-22 DIAGNOSIS — I110 Hypertensive heart disease with heart failure: Secondary | ICD-10-CM | POA: Diagnosis not present

## 2018-07-22 DIAGNOSIS — M1990 Unspecified osteoarthritis, unspecified site: Secondary | ICD-10-CM | POA: Diagnosis not present

## 2018-07-24 ENCOUNTER — Telehealth: Payer: Self-pay | Admitting: Family Medicine

## 2018-07-24 ENCOUNTER — Ambulatory Visit (INDEPENDENT_AMBULATORY_CARE_PROVIDER_SITE_OTHER): Payer: Medicare Other | Admitting: Family Medicine

## 2018-07-24 ENCOUNTER — Encounter: Payer: Self-pay | Admitting: Family Medicine

## 2018-07-24 VITALS — BP 120/82 | HR 81 | Temp 98.2°F | Wt 158.1 lb

## 2018-07-24 DIAGNOSIS — Z7982 Long term (current) use of aspirin: Secondary | ICD-10-CM | POA: Diagnosis not present

## 2018-07-24 DIAGNOSIS — I110 Hypertensive heart disease with heart failure: Secondary | ICD-10-CM | POA: Diagnosis not present

## 2018-07-24 DIAGNOSIS — A0472 Enterocolitis due to Clostridium difficile, not specified as recurrent: Secondary | ICD-10-CM | POA: Diagnosis not present

## 2018-07-24 DIAGNOSIS — Z23 Encounter for immunization: Secondary | ICD-10-CM

## 2018-07-24 DIAGNOSIS — E785 Hyperlipidemia, unspecified: Secondary | ICD-10-CM | POA: Diagnosis not present

## 2018-07-24 DIAGNOSIS — M1990 Unspecified osteoarthritis, unspecified site: Secondary | ICD-10-CM | POA: Diagnosis not present

## 2018-07-24 DIAGNOSIS — Z5181 Encounter for therapeutic drug level monitoring: Secondary | ICD-10-CM | POA: Diagnosis not present

## 2018-07-24 DIAGNOSIS — I959 Hypotension, unspecified: Secondary | ICD-10-CM | POA: Diagnosis not present

## 2018-07-24 DIAGNOSIS — M353 Polymyalgia rheumatica: Secondary | ICD-10-CM | POA: Diagnosis not present

## 2018-07-24 DIAGNOSIS — I2510 Atherosclerotic heart disease of native coronary artery without angina pectoris: Secondary | ICD-10-CM | POA: Diagnosis not present

## 2018-07-24 DIAGNOSIS — I4821 Permanent atrial fibrillation: Secondary | ICD-10-CM | POA: Diagnosis not present

## 2018-07-24 DIAGNOSIS — I509 Heart failure, unspecified: Secondary | ICD-10-CM | POA: Diagnosis not present

## 2018-07-24 DIAGNOSIS — Z7901 Long term (current) use of anticoagulants: Secondary | ICD-10-CM | POA: Diagnosis not present

## 2018-07-24 DIAGNOSIS — M109 Gout, unspecified: Secondary | ICD-10-CM | POA: Diagnosis not present

## 2018-07-24 DIAGNOSIS — Z9181 History of falling: Secondary | ICD-10-CM | POA: Diagnosis not present

## 2018-07-24 DIAGNOSIS — Z955 Presence of coronary angioplasty implant and graft: Secondary | ICD-10-CM | POA: Diagnosis not present

## 2018-07-24 MED ORDER — VANCOMYCIN HCL 125 MG PO CAPS: 125.0000 mg | ORAL_CAPSULE | Freq: Four times a day (QID) | ORAL | 0 refills | Status: DC

## 2018-07-24 MED ORDER — OLMESARTAN MEDOXOMIL 40 MG PO TABS: 20.0000 mg | ORAL_TABLET | Freq: Every day | ORAL | 2 refills | Status: DC

## 2018-07-24 NOTE — Telephone Encounter (Signed)
I have called and lmom for Levada Dy with [place] to give the ok for these orders.

## 2018-07-24 NOTE — Telephone Encounter (Signed)
Copied from [place] [id number]. Topic: Quick Communication - Home Health Verbal Orders >> Jul 24, 2018 11:16 AM Alfredia Ferguson R wrote: Caller/Agency: [facility] Number: [phone] Requesting OT/PT/Skilled Nursing/Social Work: Skilled Nursing Frequency: 2 week 2 , 1 week 2

## 2018-07-24 NOTE — Progress Notes (Signed)
   Subjective:    Patient ID: Tanner Hensley, male    DOB: [birth year], 77 y.o.   MRN: [id number]  HPI Here to follow up a hospital stay at [facility] from 07-12-18 to 07-15-18 for C diff colitis. He had taken a 10 day course of Vancomycin for C diff and had improved for awhile, but then the diarrhea returned and he became extremely fatigued. He was dehydrated and hypotensive. In the hospital he was rehydrated and given IV Vancomycin. He was sent home on a slow 7 week taper of oral Vancomycin and he feels much better. His strength and appetite are back. He has 2 small formed stools a day. No fever. His BP at home is stable in the 953U systolic.    Review of Systems  Constitutional: Negative.   Respiratory: Negative.   Cardiovascular: Negative.   Gastrointestinal: Negative.   Neurological: Negative.        Objective:   Physical Exam  Constitutional: He is oriented to person, place, and time. He appears well-developed and well-nourished.  Cardiovascular: Normal rate, regular rhythm, normal heart sounds and intact distal pulses.  Pulmonary/Chest: Effort normal and breath sounds normal.  Abdominal: Soft. Bowel sounds are normal. He exhibits no distension and no mass. There is no tenderness. There is no rebound and no guarding.  Neurological: He is alert and oriented to person, place, and time.          Assessment & Plan:  He is recovering from a C diff colitis. He is doing well and he will stay on the taper as directed. Recheck prn . Alysia Penna, MD

## 2018-07-24 NOTE — Telephone Encounter (Signed)
Called and spoke with [place].  She stated that the RN went out to open the pt up for service and they will be seeing him today for PT.

## 2018-07-25 ENCOUNTER — Telehealth: Payer: Self-pay | Admitting: Family Medicine

## 2018-07-25 NOTE — Telephone Encounter (Signed)
Called and spoke with Minna Merritts  With [facility] and gave the VO for the PT orders.

## 2018-07-25 NOTE — Telephone Encounter (Signed)
I have called and lmom for amanda x 2 to discuss the orders that she needs.

## 2018-07-25 NOTE — Telephone Encounter (Signed)
Copied from [place] [id number]. Topic: Quick Communication - See Telephone Encounter >> Jul 25, 2018 10:21 AM Burchel, Abbi R wrote: CRM for notification. See Telephone encounter for: 07/25/18.  Minna Merritts ([facility]) [phone]  Requesting v/o for  PT 2x2wk 1x2wk

## 2018-07-26 ENCOUNTER — Telehealth: Payer: Self-pay

## 2018-07-26 DIAGNOSIS — I509 Heart failure, unspecified: Secondary | ICD-10-CM | POA: Diagnosis not present

## 2018-07-26 DIAGNOSIS — Z9181 History of falling: Secondary | ICD-10-CM | POA: Diagnosis not present

## 2018-07-26 DIAGNOSIS — M353 Polymyalgia rheumatica: Secondary | ICD-10-CM | POA: Diagnosis not present

## 2018-07-26 DIAGNOSIS — I2510 Atherosclerotic heart disease of native coronary artery without angina pectoris: Secondary | ICD-10-CM | POA: Diagnosis not present

## 2018-07-26 DIAGNOSIS — M109 Gout, unspecified: Secondary | ICD-10-CM | POA: Diagnosis not present

## 2018-07-26 DIAGNOSIS — A0472 Enterocolitis due to Clostridium difficile, not specified as recurrent: Secondary | ICD-10-CM | POA: Diagnosis not present

## 2018-07-26 DIAGNOSIS — Z7901 Long term (current) use of anticoagulants: Secondary | ICD-10-CM | POA: Diagnosis not present

## 2018-07-26 DIAGNOSIS — Z7982 Long term (current) use of aspirin: Secondary | ICD-10-CM | POA: Diagnosis not present

## 2018-07-26 DIAGNOSIS — M1990 Unspecified osteoarthritis, unspecified site: Secondary | ICD-10-CM | POA: Diagnosis not present

## 2018-07-26 DIAGNOSIS — I4821 Permanent atrial fibrillation: Secondary | ICD-10-CM | POA: Diagnosis not present

## 2018-07-26 DIAGNOSIS — Z5181 Encounter for therapeutic drug level monitoring: Secondary | ICD-10-CM | POA: Diagnosis not present

## 2018-07-26 DIAGNOSIS — I110 Hypertensive heart disease with heart failure: Secondary | ICD-10-CM | POA: Diagnosis not present

## 2018-07-26 DIAGNOSIS — Z955 Presence of coronary angioplasty implant and graft: Secondary | ICD-10-CM | POA: Diagnosis not present

## 2018-07-26 DIAGNOSIS — E785 Hyperlipidemia, unspecified: Secondary | ICD-10-CM | POA: Diagnosis not present

## 2018-07-26 NOTE — Telephone Encounter (Signed)
Pt's wife called- [facility] was supposed to contact Dr Madilyn Fireman about them taking over checking pt's INR at home. Wanting to know if Dr Madilyn Fireman has received any paperwork for this yet?   Please advise

## 2018-07-27 ENCOUNTER — Telehealth: Payer: Self-pay | Admitting: General Practice

## 2018-07-27 DIAGNOSIS — I509 Heart failure, unspecified: Secondary | ICD-10-CM | POA: Diagnosis not present

## 2018-07-27 DIAGNOSIS — M109 Gout, unspecified: Secondary | ICD-10-CM | POA: Diagnosis not present

## 2018-07-27 DIAGNOSIS — E785 Hyperlipidemia, unspecified: Secondary | ICD-10-CM | POA: Diagnosis not present

## 2018-07-27 DIAGNOSIS — Z7982 Long term (current) use of aspirin: Secondary | ICD-10-CM | POA: Diagnosis not present

## 2018-07-27 DIAGNOSIS — I2510 Atherosclerotic heart disease of native coronary artery without angina pectoris: Secondary | ICD-10-CM | POA: Diagnosis not present

## 2018-07-27 DIAGNOSIS — A0472 Enterocolitis due to Clostridium difficile, not specified as recurrent: Secondary | ICD-10-CM | POA: Diagnosis not present

## 2018-07-27 DIAGNOSIS — Z5181 Encounter for therapeutic drug level monitoring: Secondary | ICD-10-CM | POA: Diagnosis not present

## 2018-07-27 DIAGNOSIS — Z9181 History of falling: Secondary | ICD-10-CM | POA: Diagnosis not present

## 2018-07-27 DIAGNOSIS — I4821 Permanent atrial fibrillation: Secondary | ICD-10-CM | POA: Diagnosis not present

## 2018-07-27 DIAGNOSIS — M353 Polymyalgia rheumatica: Secondary | ICD-10-CM | POA: Diagnosis not present

## 2018-07-27 DIAGNOSIS — I110 Hypertensive heart disease with heart failure: Secondary | ICD-10-CM | POA: Diagnosis not present

## 2018-07-27 DIAGNOSIS — M1990 Unspecified osteoarthritis, unspecified site: Secondary | ICD-10-CM | POA: Diagnosis not present

## 2018-07-27 DIAGNOSIS — Z955 Presence of coronary angioplasty implant and graft: Secondary | ICD-10-CM | POA: Diagnosis not present

## 2018-07-27 DIAGNOSIS — Z7901 Long term (current) use of anticoagulants: Secondary | ICD-10-CM | POA: Diagnosis not present

## 2018-07-27 NOTE — Telephone Encounter (Signed)
I have not seen anything and I do not know anything about it.  It could have been sent to his primary care provider instead of [place].

## 2018-07-27 NOTE — Telephone Encounter (Signed)
Pt's wife advised. She will contact PCP's office.

## 2018-07-27 NOTE — Telephone Encounter (Signed)
Order is being sent today to [facility] to check INR tomorrow (11/22).

## 2018-07-27 NOTE — Telephone Encounter (Signed)
-----   Message from Warden Fillers, RN sent at 07/27/2018 10:03 AM EST ----- Regarding: INR Hi Dr. Sarajane Jews,  Patient recently dc'd from hospital and wants to have INR checked by home health RN with [facility].  If it's OK with you I will send an order over to [facility] to do this.  He then can start coming to see me in the coumadin clinic.  Are you OK with this?  Please advise  Thanks, Villa Herb, RN

## 2018-07-27 NOTE — Telephone Encounter (Signed)
Yes that's fine. thanks

## 2018-07-28 DIAGNOSIS — I509 Heart failure, unspecified: Secondary | ICD-10-CM | POA: Diagnosis not present

## 2018-07-28 DIAGNOSIS — Z7901 Long term (current) use of anticoagulants: Secondary | ICD-10-CM | POA: Diagnosis not present

## 2018-07-28 DIAGNOSIS — Z5181 Encounter for therapeutic drug level monitoring: Secondary | ICD-10-CM | POA: Diagnosis not present

## 2018-07-28 DIAGNOSIS — M353 Polymyalgia rheumatica: Secondary | ICD-10-CM | POA: Diagnosis not present

## 2018-07-28 DIAGNOSIS — A0472 Enterocolitis due to Clostridium difficile, not specified as recurrent: Secondary | ICD-10-CM | POA: Diagnosis not present

## 2018-07-28 DIAGNOSIS — M109 Gout, unspecified: Secondary | ICD-10-CM | POA: Diagnosis not present

## 2018-07-28 DIAGNOSIS — E785 Hyperlipidemia, unspecified: Secondary | ICD-10-CM | POA: Diagnosis not present

## 2018-07-28 DIAGNOSIS — Z955 Presence of coronary angioplasty implant and graft: Secondary | ICD-10-CM | POA: Diagnosis not present

## 2018-07-28 DIAGNOSIS — I2510 Atherosclerotic heart disease of native coronary artery without angina pectoris: Secondary | ICD-10-CM | POA: Diagnosis not present

## 2018-07-28 DIAGNOSIS — Z9181 History of falling: Secondary | ICD-10-CM | POA: Diagnosis not present

## 2018-07-28 DIAGNOSIS — Z7982 Long term (current) use of aspirin: Secondary | ICD-10-CM | POA: Diagnosis not present

## 2018-07-28 DIAGNOSIS — M1990 Unspecified osteoarthritis, unspecified site: Secondary | ICD-10-CM | POA: Diagnosis not present

## 2018-07-28 DIAGNOSIS — I110 Hypertensive heart disease with heart failure: Secondary | ICD-10-CM | POA: Diagnosis not present

## 2018-07-28 DIAGNOSIS — I4821 Permanent atrial fibrillation: Secondary | ICD-10-CM | POA: Diagnosis not present

## 2018-07-31 NOTE — Telephone Encounter (Signed)
Orders had been signed for this pt by Dr. Sarajane Jews.

## 2018-08-01 ENCOUNTER — Ambulatory Visit (INDEPENDENT_AMBULATORY_CARE_PROVIDER_SITE_OTHER): Payer: Medicare Other | Admitting: General Practice

## 2018-08-01 DIAGNOSIS — Z9181 History of falling: Secondary | ICD-10-CM | POA: Diagnosis not present

## 2018-08-01 DIAGNOSIS — I110 Hypertensive heart disease with heart failure: Secondary | ICD-10-CM | POA: Diagnosis not present

## 2018-08-01 DIAGNOSIS — I509 Heart failure, unspecified: Secondary | ICD-10-CM | POA: Diagnosis not present

## 2018-08-01 DIAGNOSIS — M353 Polymyalgia rheumatica: Secondary | ICD-10-CM | POA: Diagnosis not present

## 2018-08-01 DIAGNOSIS — Z7901 Long term (current) use of anticoagulants: Secondary | ICD-10-CM | POA: Diagnosis not present

## 2018-08-01 DIAGNOSIS — Z7982 Long term (current) use of aspirin: Secondary | ICD-10-CM | POA: Diagnosis not present

## 2018-08-01 DIAGNOSIS — Z5181 Encounter for therapeutic drug level monitoring: Secondary | ICD-10-CM | POA: Diagnosis not present

## 2018-08-01 DIAGNOSIS — M109 Gout, unspecified: Secondary | ICD-10-CM | POA: Diagnosis not present

## 2018-08-01 DIAGNOSIS — A0472 Enterocolitis due to Clostridium difficile, not specified as recurrent: Secondary | ICD-10-CM | POA: Diagnosis not present

## 2018-08-01 DIAGNOSIS — I4821 Permanent atrial fibrillation: Secondary | ICD-10-CM | POA: Diagnosis not present

## 2018-08-01 DIAGNOSIS — M1990 Unspecified osteoarthritis, unspecified site: Secondary | ICD-10-CM | POA: Diagnosis not present

## 2018-08-01 DIAGNOSIS — I4891 Unspecified atrial fibrillation: Secondary | ICD-10-CM | POA: Diagnosis not present

## 2018-08-01 DIAGNOSIS — E785 Hyperlipidemia, unspecified: Secondary | ICD-10-CM | POA: Diagnosis not present

## 2018-08-01 DIAGNOSIS — I2510 Atherosclerotic heart disease of native coronary artery without angina pectoris: Secondary | ICD-10-CM | POA: Diagnosis not present

## 2018-08-01 DIAGNOSIS — Z955 Presence of coronary angioplasty implant and graft: Secondary | ICD-10-CM | POA: Diagnosis not present

## 2018-08-01 LAB — POCT INR: INR: 3.3 — AB (ref 2.0–3.0)

## 2018-08-01 NOTE — Patient Instructions (Signed)
Pre visit review using our clinic review tool, if applicable. No additional management support is needed unless otherwise documented below in the visit note.  Hold coumadin today and then decrease dose to 1/2 tab on Mon/Wed and Sat. And 1 tablet all other days.  Re-check in 1 week.  Dosing instructions given to Hilda Blades, RN @ [facility] [place] - [phone]

## 2018-08-02 DIAGNOSIS — E785 Hyperlipidemia, unspecified: Secondary | ICD-10-CM | POA: Diagnosis not present

## 2018-08-02 DIAGNOSIS — A0472 Enterocolitis due to Clostridium difficile, not specified as recurrent: Secondary | ICD-10-CM | POA: Diagnosis not present

## 2018-08-02 DIAGNOSIS — Z9181 History of falling: Secondary | ICD-10-CM | POA: Diagnosis not present

## 2018-08-02 DIAGNOSIS — I110 Hypertensive heart disease with heart failure: Secondary | ICD-10-CM | POA: Diagnosis not present

## 2018-08-02 DIAGNOSIS — I4821 Permanent atrial fibrillation: Secondary | ICD-10-CM | POA: Diagnosis not present

## 2018-08-02 DIAGNOSIS — M109 Gout, unspecified: Secondary | ICD-10-CM | POA: Diagnosis not present

## 2018-08-02 DIAGNOSIS — I2510 Atherosclerotic heart disease of native coronary artery without angina pectoris: Secondary | ICD-10-CM | POA: Diagnosis not present

## 2018-08-02 DIAGNOSIS — I509 Heart failure, unspecified: Secondary | ICD-10-CM | POA: Diagnosis not present

## 2018-08-02 DIAGNOSIS — Z7901 Long term (current) use of anticoagulants: Secondary | ICD-10-CM | POA: Diagnosis not present

## 2018-08-02 DIAGNOSIS — M1990 Unspecified osteoarthritis, unspecified site: Secondary | ICD-10-CM | POA: Diagnosis not present

## 2018-08-02 DIAGNOSIS — Z955 Presence of coronary angioplasty implant and graft: Secondary | ICD-10-CM | POA: Diagnosis not present

## 2018-08-02 DIAGNOSIS — Z5181 Encounter for therapeutic drug level monitoring: Secondary | ICD-10-CM | POA: Diagnosis not present

## 2018-08-02 DIAGNOSIS — M353 Polymyalgia rheumatica: Secondary | ICD-10-CM | POA: Diagnosis not present

## 2018-08-02 DIAGNOSIS — Z7982 Long term (current) use of aspirin: Secondary | ICD-10-CM | POA: Diagnosis not present

## 2018-08-04 DIAGNOSIS — Z9181 History of falling: Secondary | ICD-10-CM | POA: Diagnosis not present

## 2018-08-04 DIAGNOSIS — M109 Gout, unspecified: Secondary | ICD-10-CM | POA: Diagnosis not present

## 2018-08-04 DIAGNOSIS — E785 Hyperlipidemia, unspecified: Secondary | ICD-10-CM | POA: Diagnosis not present

## 2018-08-04 DIAGNOSIS — Z955 Presence of coronary angioplasty implant and graft: Secondary | ICD-10-CM | POA: Diagnosis not present

## 2018-08-04 DIAGNOSIS — Z5181 Encounter for therapeutic drug level monitoring: Secondary | ICD-10-CM | POA: Diagnosis not present

## 2018-08-04 DIAGNOSIS — A0472 Enterocolitis due to Clostridium difficile, not specified as recurrent: Secondary | ICD-10-CM | POA: Diagnosis not present

## 2018-08-04 DIAGNOSIS — M353 Polymyalgia rheumatica: Secondary | ICD-10-CM | POA: Diagnosis not present

## 2018-08-04 DIAGNOSIS — I4821 Permanent atrial fibrillation: Secondary | ICD-10-CM | POA: Diagnosis not present

## 2018-08-04 DIAGNOSIS — Z7901 Long term (current) use of anticoagulants: Secondary | ICD-10-CM | POA: Diagnosis not present

## 2018-08-04 DIAGNOSIS — I110 Hypertensive heart disease with heart failure: Secondary | ICD-10-CM | POA: Diagnosis not present

## 2018-08-04 DIAGNOSIS — Z7982 Long term (current) use of aspirin: Secondary | ICD-10-CM | POA: Diagnosis not present

## 2018-08-04 DIAGNOSIS — I2510 Atherosclerotic heart disease of native coronary artery without angina pectoris: Secondary | ICD-10-CM | POA: Diagnosis not present

## 2018-08-04 DIAGNOSIS — M1990 Unspecified osteoarthritis, unspecified site: Secondary | ICD-10-CM | POA: Diagnosis not present

## 2018-08-04 DIAGNOSIS — I509 Heart failure, unspecified: Secondary | ICD-10-CM | POA: Diagnosis not present

## 2018-08-07 DIAGNOSIS — I495 Sick sinus syndrome: Secondary | ICD-10-CM | POA: Diagnosis not present

## 2018-08-07 DIAGNOSIS — I10 Essential (primary) hypertension: Secondary | ICD-10-CM | POA: Diagnosis not present

## 2018-08-07 DIAGNOSIS — Z950 Presence of cardiac pacemaker: Secondary | ICD-10-CM | POA: Diagnosis not present

## 2018-08-07 DIAGNOSIS — I2510 Atherosclerotic heart disease of native coronary artery without angina pectoris: Secondary | ICD-10-CM | POA: Diagnosis not present

## 2018-08-07 DIAGNOSIS — I4811 Longstanding persistent atrial fibrillation: Secondary | ICD-10-CM | POA: Diagnosis not present

## 2018-08-07 DIAGNOSIS — I509 Heart failure, unspecified: Secondary | ICD-10-CM | POA: Diagnosis not present

## 2018-08-07 DIAGNOSIS — Z7901 Long term (current) use of anticoagulants: Secondary | ICD-10-CM | POA: Diagnosis not present

## 2018-08-07 DIAGNOSIS — E785 Hyperlipidemia, unspecified: Secondary | ICD-10-CM | POA: Diagnosis not present

## 2018-08-07 DIAGNOSIS — M353 Polymyalgia rheumatica: Secondary | ICD-10-CM | POA: Diagnosis not present

## 2018-08-07 DIAGNOSIS — Z9181 History of falling: Secondary | ICD-10-CM | POA: Diagnosis not present

## 2018-08-07 DIAGNOSIS — M1990 Unspecified osteoarthritis, unspecified site: Secondary | ICD-10-CM | POA: Diagnosis not present

## 2018-08-07 DIAGNOSIS — A0472 Enterocolitis due to Clostridium difficile, not specified as recurrent: Secondary | ICD-10-CM | POA: Diagnosis not present

## 2018-08-07 DIAGNOSIS — I110 Hypertensive heart disease with heart failure: Secondary | ICD-10-CM | POA: Diagnosis not present

## 2018-08-07 DIAGNOSIS — Z7982 Long term (current) use of aspirin: Secondary | ICD-10-CM | POA: Diagnosis not present

## 2018-08-07 DIAGNOSIS — M109 Gout, unspecified: Secondary | ICD-10-CM | POA: Diagnosis not present

## 2018-08-07 DIAGNOSIS — I4821 Permanent atrial fibrillation: Secondary | ICD-10-CM | POA: Diagnosis not present

## 2018-08-07 DIAGNOSIS — Z5181 Encounter for therapeutic drug level monitoring: Secondary | ICD-10-CM | POA: Diagnosis not present

## 2018-08-07 DIAGNOSIS — Z955 Presence of coronary angioplasty implant and graft: Secondary | ICD-10-CM | POA: Diagnosis not present

## 2018-08-08 ENCOUNTER — Telehealth: Payer: Self-pay | Admitting: Family Medicine

## 2018-08-08 ENCOUNTER — Ambulatory Visit (INDEPENDENT_AMBULATORY_CARE_PROVIDER_SITE_OTHER): Payer: Medicare Other | Admitting: General Practice

## 2018-08-08 DIAGNOSIS — I4821 Permanent atrial fibrillation: Secondary | ICD-10-CM | POA: Diagnosis not present

## 2018-08-08 DIAGNOSIS — I2510 Atherosclerotic heart disease of native coronary artery without angina pectoris: Secondary | ICD-10-CM | POA: Diagnosis not present

## 2018-08-08 DIAGNOSIS — Z5181 Encounter for therapeutic drug level monitoring: Secondary | ICD-10-CM | POA: Diagnosis not present

## 2018-08-08 DIAGNOSIS — Z7901 Long term (current) use of anticoagulants: Secondary | ICD-10-CM

## 2018-08-08 DIAGNOSIS — I509 Heart failure, unspecified: Secondary | ICD-10-CM | POA: Diagnosis not present

## 2018-08-08 DIAGNOSIS — M353 Polymyalgia rheumatica: Secondary | ICD-10-CM | POA: Diagnosis not present

## 2018-08-08 DIAGNOSIS — I4891 Unspecified atrial fibrillation: Secondary | ICD-10-CM | POA: Diagnosis not present

## 2018-08-08 DIAGNOSIS — Z7982 Long term (current) use of aspirin: Secondary | ICD-10-CM | POA: Diagnosis not present

## 2018-08-08 DIAGNOSIS — E785 Hyperlipidemia, unspecified: Secondary | ICD-10-CM | POA: Diagnosis not present

## 2018-08-08 DIAGNOSIS — Z955 Presence of coronary angioplasty implant and graft: Secondary | ICD-10-CM | POA: Diagnosis not present

## 2018-08-08 DIAGNOSIS — Z9181 History of falling: Secondary | ICD-10-CM | POA: Diagnosis not present

## 2018-08-08 DIAGNOSIS — I110 Hypertensive heart disease with heart failure: Secondary | ICD-10-CM | POA: Diagnosis not present

## 2018-08-08 DIAGNOSIS — M1990 Unspecified osteoarthritis, unspecified site: Secondary | ICD-10-CM | POA: Diagnosis not present

## 2018-08-08 DIAGNOSIS — M109 Gout, unspecified: Secondary | ICD-10-CM | POA: Diagnosis not present

## 2018-08-08 DIAGNOSIS — A0472 Enterocolitis due to Clostridium difficile, not specified as recurrent: Secondary | ICD-10-CM | POA: Diagnosis not present

## 2018-08-08 LAB — POCT INR: INR: 3.3 — AB (ref 2.0–3.0)

## 2018-08-08 MED ORDER — OLMESARTAN MEDOXOMIL 20 MG PO TABS: 20.0000 mg | ORAL_TABLET | Freq: Every day | ORAL | 3 refills | Status: DC

## 2018-08-08 NOTE — Telephone Encounter (Signed)
Copied from [place] [phone]. Topic: Quick Communication - Rx Refill/Question >> Aug 08, 2018  9:15 AM Scherrie Gerlach wrote: Medication: olmesartan (BENICAR) 20 MG tablet  Pt needs this sent to mail order since he will be taking all the time. However this med was prescribed 40 mg, and pt was cutting in half.  But pt states it will be better to do the 20 mg and not have to cut in half. Please send new Rx 90 day [place], [place] [phone] (Phone) [phone] (Fax)

## 2018-08-08 NOTE — Telephone Encounter (Signed)
Refill has been sent to the mail order per pts request.  Nothing further is needed.

## 2018-08-08 NOTE — Patient Instructions (Signed)
Pre visit review using our clinic review tool, if applicable. No additional management support is needed unless otherwise documented below in the visit note.  Hold coumadin today and then decrease dose to 1/2 tab on Mon/Wed and Sat. And 1 tablet all other days.  Re-check in 1 week.  Dosing instructions given to Hilda Blades, RN @ [facility] [place] - [phone]

## 2018-08-09 DIAGNOSIS — F0390 Unspecified dementia without behavioral disturbance: Secondary | ICD-10-CM

## 2018-08-09 DIAGNOSIS — I4821 Permanent atrial fibrillation: Secondary | ICD-10-CM | POA: Diagnosis not present

## 2018-08-09 DIAGNOSIS — M1990 Unspecified osteoarthritis, unspecified site: Secondary | ICD-10-CM

## 2018-08-09 DIAGNOSIS — I110 Hypertensive heart disease with heart failure: Secondary | ICD-10-CM | POA: Diagnosis not present

## 2018-08-09 DIAGNOSIS — Z9181 History of falling: Secondary | ICD-10-CM

## 2018-08-09 DIAGNOSIS — Z7982 Long term (current) use of aspirin: Secondary | ICD-10-CM

## 2018-08-09 DIAGNOSIS — I2510 Atherosclerotic heart disease of native coronary artery without angina pectoris: Secondary | ICD-10-CM | POA: Diagnosis not present

## 2018-08-09 DIAGNOSIS — Z5181 Encounter for therapeutic drug level monitoring: Secondary | ICD-10-CM

## 2018-08-09 DIAGNOSIS — A0472 Enterocolitis due to Clostridium difficile, not specified as recurrent: Secondary | ICD-10-CM | POA: Diagnosis not present

## 2018-08-09 DIAGNOSIS — M109 Gout, unspecified: Secondary | ICD-10-CM

## 2018-08-09 DIAGNOSIS — I509 Heart failure, unspecified: Secondary | ICD-10-CM | POA: Diagnosis not present

## 2018-08-09 DIAGNOSIS — Z7901 Long term (current) use of anticoagulants: Secondary | ICD-10-CM

## 2018-08-09 DIAGNOSIS — M353 Polymyalgia rheumatica: Secondary | ICD-10-CM

## 2018-08-09 DIAGNOSIS — E785 Hyperlipidemia, unspecified: Secondary | ICD-10-CM

## 2018-08-09 DIAGNOSIS — Z955 Presence of coronary angioplasty implant and graft: Secondary | ICD-10-CM

## 2018-08-15 ENCOUNTER — Ambulatory Visit (INDEPENDENT_AMBULATORY_CARE_PROVIDER_SITE_OTHER): Payer: Medicare Other | Admitting: General Practice

## 2018-08-15 DIAGNOSIS — Z7901 Long term (current) use of anticoagulants: Secondary | ICD-10-CM | POA: Diagnosis not present

## 2018-08-15 DIAGNOSIS — Z5181 Encounter for therapeutic drug level monitoring: Secondary | ICD-10-CM | POA: Diagnosis not present

## 2018-08-15 DIAGNOSIS — Z7982 Long term (current) use of aspirin: Secondary | ICD-10-CM | POA: Diagnosis not present

## 2018-08-15 DIAGNOSIS — I2510 Atherosclerotic heart disease of native coronary artery without angina pectoris: Secondary | ICD-10-CM | POA: Diagnosis not present

## 2018-08-15 DIAGNOSIS — M353 Polymyalgia rheumatica: Secondary | ICD-10-CM | POA: Diagnosis not present

## 2018-08-15 DIAGNOSIS — E785 Hyperlipidemia, unspecified: Secondary | ICD-10-CM | POA: Diagnosis not present

## 2018-08-15 DIAGNOSIS — I110 Hypertensive heart disease with heart failure: Secondary | ICD-10-CM | POA: Diagnosis not present

## 2018-08-15 DIAGNOSIS — I4821 Permanent atrial fibrillation: Secondary | ICD-10-CM | POA: Diagnosis not present

## 2018-08-15 DIAGNOSIS — I4891 Unspecified atrial fibrillation: Secondary | ICD-10-CM

## 2018-08-15 DIAGNOSIS — I509 Heart failure, unspecified: Secondary | ICD-10-CM | POA: Diagnosis not present

## 2018-08-15 DIAGNOSIS — M1990 Unspecified osteoarthritis, unspecified site: Secondary | ICD-10-CM | POA: Diagnosis not present

## 2018-08-15 DIAGNOSIS — Z9181 History of falling: Secondary | ICD-10-CM | POA: Diagnosis not present

## 2018-08-15 DIAGNOSIS — A0472 Enterocolitis due to Clostridium difficile, not specified as recurrent: Secondary | ICD-10-CM | POA: Diagnosis not present

## 2018-08-15 DIAGNOSIS — Z955 Presence of coronary angioplasty implant and graft: Secondary | ICD-10-CM | POA: Diagnosis not present

## 2018-08-15 DIAGNOSIS — M109 Gout, unspecified: Secondary | ICD-10-CM | POA: Diagnosis not present

## 2018-08-15 LAB — POCT INR: INR: 2.6 (ref 2.0–3.0)

## 2018-08-15 NOTE — Patient Instructions (Signed)
Pre visit review using our clinic review tool, if applicable. No additional management support is needed unless otherwise documented below in the visit note. 

## 2018-08-16 ENCOUNTER — Ambulatory Visit (INDEPENDENT_AMBULATORY_CARE_PROVIDER_SITE_OTHER): Payer: Medicare Other | Admitting: Family Medicine

## 2018-08-16 ENCOUNTER — Encounter: Payer: Self-pay | Admitting: Family Medicine

## 2018-08-16 VITALS — BP 120/68 | HR 81 | Temp 98.1°F | Wt 166.1 lb

## 2018-08-16 DIAGNOSIS — A0472 Enterocolitis due to Clostridium difficile, not specified as recurrent: Secondary | ICD-10-CM | POA: Diagnosis not present

## 2018-08-16 DIAGNOSIS — T7840XA Allergy, unspecified, initial encounter: Secondary | ICD-10-CM

## 2018-08-16 MED ORDER — PREDNISONE 5 MG PO TABS: ORAL_TABLET | ORAL | 0 refills | Status: DC

## 2018-08-16 NOTE — Progress Notes (Signed)
   Subjective:    Patient ID: Tanner Hensley, male    DOB: Apr 03, 1941, 77 y.o.   MRN: [id number]  HPI Here for 4 days of an itchy rash which started on the abdomen and then spread to the arms and legs. No tongue swelling or SOB. Benadryl helps. 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 He feels better from that. His stools are solid again but he still averages 3-4 a day.    Review of Systems  Constitutional: Negative.   Respiratory: Negative.   Cardiovascular: Negative.   Gastrointestinal: Negative.   Skin: Positive for rash.       Objective:   Physical Exam  Constitutional: He is oriented to person, place, and time. He appears well-developed and well-nourished.  Cardiovascular: Normal rate, regular rhythm, normal heart sounds and intact distal pulses.  Pulmonary/Chest: Effort normal and breath sounds normal.  Neurological: He is alert and oriented to person, place, and time.  Skin:  Areas on both anterior thighs show red urticaria           Assessment & Plan:  Dermatitis, likely an allergic reaction to Vancomycin. We will try to let him finish the final 2 weeks of treatment. He will also take Prednisone to counteract the allergic reaction by taking 10 mg daily for 7 days and then 5 mg daily for 7 days. Recheck prn.  Alysia Penna, MD

## 2018-08-18 DIAGNOSIS — M109 Gout, unspecified: Secondary | ICD-10-CM | POA: Diagnosis not present

## 2018-08-18 DIAGNOSIS — I509 Heart failure, unspecified: Secondary | ICD-10-CM | POA: Diagnosis not present

## 2018-08-18 DIAGNOSIS — I4821 Permanent atrial fibrillation: Secondary | ICD-10-CM | POA: Diagnosis not present

## 2018-08-18 DIAGNOSIS — I110 Hypertensive heart disease with heart failure: Secondary | ICD-10-CM | POA: Diagnosis not present

## 2018-08-18 DIAGNOSIS — Z7901 Long term (current) use of anticoagulants: Secondary | ICD-10-CM | POA: Diagnosis not present

## 2018-08-18 DIAGNOSIS — M353 Polymyalgia rheumatica: Secondary | ICD-10-CM | POA: Diagnosis not present

## 2018-08-18 DIAGNOSIS — Z955 Presence of coronary angioplasty implant and graft: Secondary | ICD-10-CM | POA: Diagnosis not present

## 2018-08-18 DIAGNOSIS — I2510 Atherosclerotic heart disease of native coronary artery without angina pectoris: Secondary | ICD-10-CM | POA: Diagnosis not present

## 2018-08-18 DIAGNOSIS — Z9181 History of falling: Secondary | ICD-10-CM | POA: Diagnosis not present

## 2018-08-18 DIAGNOSIS — E785 Hyperlipidemia, unspecified: Secondary | ICD-10-CM | POA: Diagnosis not present

## 2018-08-18 DIAGNOSIS — Z7982 Long term (current) use of aspirin: Secondary | ICD-10-CM | POA: Diagnosis not present

## 2018-08-18 DIAGNOSIS — M1990 Unspecified osteoarthritis, unspecified site: Secondary | ICD-10-CM | POA: Diagnosis not present

## 2018-08-18 DIAGNOSIS — Z5181 Encounter for therapeutic drug level monitoring: Secondary | ICD-10-CM | POA: Diagnosis not present

## 2018-08-18 DIAGNOSIS — A0472 Enterocolitis due to Clostridium difficile, not specified as recurrent: Secondary | ICD-10-CM | POA: Diagnosis not present

## 2018-08-24 ENCOUNTER — Telehealth: Payer: Self-pay | Admitting: Family Medicine

## 2018-08-24 NOTE — Telephone Encounter (Signed)
Patient's wife called and asked about her request. She says that initially, she thought the prednisone was enough, but she realized he has 2 more doses of Vancomycin and will need another 5 days worth of Prednisone to cover the Vancomycin and after.

## 2018-08-24 NOTE — Telephone Encounter (Signed)
Copied from [place] [id number]. Topic: Quick Communication - Rx Refill/Question >> Aug 24, 2018  4:21 PM Bea Graff, NT wrote: Medication: predniSONE (DELTASONE) 5 MG tablet-5 more days worth in order to finish up the vancomycin.   Has the patient contacted their pharmacy? Yes.   (Agent: If no, request that the patient contact the pharmacy for the refill.) (Agent: If yes, when and what did the pharmacy advise?)  Preferred Pharmacy (with phone number or street name): [id number] - [place], [place] [phone] (Phone) [phone] (Fax)    Agent: Please be advised that RX refills may take up to 3 business days. We ask that you follow-up with your pharmacy.

## 2018-08-24 NOTE — Telephone Encounter (Signed)
Requested medication (s) are due for refill today:Yes  Requested medication (s) are on the active medication list: Yes  Last refill:  08/16/18  Future visit scheduled: Yes  Notes to clinic:  Unable to refill, will need additional to cover, see notes for explanation.     Requested Prescriptions  Pending Prescriptions Disp Refills   predniSONE (DELTASONE) 5 MG tablet 21 tablet 0    Sig: Take 2 tabs a day for 7 days, then take one tab a day for 7 days, then stop     Not Delegated - Endocrinology:  Oral Corticosteroids Failed - 08/24/2018  5:08 PM      Failed - This refill cannot be delegated      Passed - Last BP in normal range    BP Readings from Last 1 Encounters:  08/16/18 120/68         Passed - Valid encounter within last 6 months    Recent Outpatient Visits          1 week ago Allergic reaction to drug, initial encounter   [facility], Ishmael Holter, MD   1 month ago Colitis due to Clostridium difficile   [facility], Ishmael Holter, MD   1 month ago C. difficile colitis   [facility], Ishmael Holter, MD   2 months ago Community acquired pneumonia of left lower lobe of lung ([place])   [place] [facility] [place], [place]   2 months ago Community acquired pneumonia of left lower lobe of lung ([place])   [place] at [facility], Ishmael Holter, MD

## 2018-08-29 ENCOUNTER — Other Ambulatory Visit: Payer: Self-pay | Admitting: Family Medicine

## 2018-08-29 ENCOUNTER — Ambulatory Visit: Payer: Self-pay

## 2018-08-29 NOTE — Telephone Encounter (Signed)
Spoke with Tanner Hensley; she requests that this information be sent to the office for final disposition.

## 2018-08-29 NOTE — Telephone Encounter (Signed)
Pt's wife called to check on status of getting more Prednisone 5MG . She stated he was supposed to finish it at the same time as the vancomycin (VANCOCIN) 125 MG capsule And she stated she miscalculated the number of vancomycin. She is requesting 5 more prednisone tablets 5MG . Please advise. CB# [phone]

## 2018-08-29 NOTE — Telephone Encounter (Signed)
Dr. Fry please advise. Thanks  

## 2018-08-29 NOTE — Telephone Encounter (Signed)
Patient's wife called and says that he husband needs the refill of prednisone to take until he finishes the Vancomycin. I advised that the requests have been sent to Dr. Sarajane Jews. She asks should he stop taking the Vancomycin until he gets the prednisone. I told her I couldn't advised not to take the Vancomycin, but if he breaks out to go to the UC to receive prednisone. She says he will break out, so she will talk to her husband to see what he wants to do, but she says she will tell him to hold off on taking the Vancomycin until he receives the prednisone.

## 2018-08-31 NOTE — Telephone Encounter (Signed)
Done

## 2018-08-31 NOTE — Telephone Encounter (Signed)
Refill of the prednisone has been sent to the pharmacy for 5 tablets.  Pt is aware.

## 2018-08-31 NOTE — Telephone Encounter (Signed)
Dr. Sarajane Jews please advise on giving the prednisone so he may finish the vancomycin

## 2018-08-31 NOTE — Telephone Encounter (Signed)
Call in 5 more tabs of 5 mg Prednisone

## 2018-09-07 DIAGNOSIS — Z950 Presence of cardiac pacemaker: Secondary | ICD-10-CM | POA: Diagnosis not present

## 2018-09-13 ENCOUNTER — Ambulatory Visit (INDEPENDENT_AMBULATORY_CARE_PROVIDER_SITE_OTHER): Payer: Medicare Other | Admitting: General Practice

## 2018-09-13 ENCOUNTER — Other Ambulatory Visit: Payer: Self-pay | Admitting: General Practice

## 2018-09-13 ENCOUNTER — Ambulatory Visit: Payer: Medicare Other

## 2018-09-13 DIAGNOSIS — I4891 Unspecified atrial fibrillation: Secondary | ICD-10-CM

## 2018-09-13 DIAGNOSIS — R8290 Unspecified abnormal findings in urine: Secondary | ICD-10-CM

## 2018-09-13 LAB — POCT INR: INR: 2.1 (ref 2.0–3.0)

## 2018-09-13 MED ORDER — WARFARIN SODIUM 5 MG PO TABS: ORAL_TABLET | ORAL | 1 refills | Status: DC

## 2018-09-13 NOTE — Patient Instructions (Addendum)
Pre visit review using our clinic review tool, if applicable. No additional management support is needed unless otherwise documented below in the visit note.  Continue to take 1/2 tab on Mon/Wed and Sat. And 1 tablet all other days.  Re-check in 4 weeks.   

## 2018-09-18 ENCOUNTER — Encounter: Payer: Self-pay | Admitting: Family Medicine

## 2018-09-18 ENCOUNTER — Ambulatory Visit (INDEPENDENT_AMBULATORY_CARE_PROVIDER_SITE_OTHER): Payer: Medicare Other | Admitting: Family Medicine

## 2018-09-18 VITALS — BP 132/84 | HR 81 | Temp 98.5°F | Wt 161.1 lb

## 2018-09-18 DIAGNOSIS — R8290 Unspecified abnormal findings in urine: Secondary | ICD-10-CM

## 2018-09-18 NOTE — Progress Notes (Signed)
   Subjective:    Patient ID: Tanner Hensley, male    DOB: 17-May-1941, 78 y.o.   MRN: [id number]  HPI Here for 2 weeks of foul smelling urine. No burning or urgency. No fever, but he does report feeling fatigued. His diarrhea from C diff has resolved.     Review of Systems  Constitutional: Positive for fatigue.  Respiratory: Negative.   Cardiovascular: Negative.   Gastrointestinal: Negative.   Genitourinary: Negative for dysuria, flank pain, frequency and urgency.  Neurological: Negative.        Objective:   Physical Exam Constitutional:      Appearance: Normal appearance.  Cardiovascular:     Rate and Rhythm: Normal rate and regular rhythm.     Pulses: Normal pulses.     Heart sounds: Normal heart sounds.  Pulmonary:     Effort: Pulmonary effort is normal.     Breath sounds: Normal breath sounds.  Abdominal:     General: Abdomen is flat. Bowel sounds are normal. There is no distension.     Palpations: Abdomen is soft. There is no mass.     Tenderness: There is no abdominal tenderness. There is no guarding or rebound.     Hernia: No hernia is present.  Neurological:     General: No focal deficit present.     Mental Status: He is alert and oriented to person, place, and time.           Assessment & Plan:  Possible UTI. Our clinic is out of testing strips, so we will send his urine sample out to the main lab for testing. He will drink plenty of water.  Alysia Penna, MD

## 2018-09-20 ENCOUNTER — Other Ambulatory Visit: Payer: Self-pay | Admitting: Family Medicine

## 2018-09-20 DIAGNOSIS — R8290 Unspecified abnormal findings in urine: Secondary | ICD-10-CM

## 2018-09-21 ENCOUNTER — Telehealth: Payer: Self-pay | Admitting: *Deleted

## 2018-09-21 DIAGNOSIS — R8290 Unspecified abnormal findings in urine: Secondary | ICD-10-CM | POA: Diagnosis not present

## 2018-09-21 LAB — POCT URINALYSIS DIPSTICK
Bilirubin, UA: NEGATIVE
Bilirubin, UA: NEGATIVE
Blood, UA: NEGATIVE
Blood, UA: NEGATIVE
GLUCOSE UA: NEGATIVE
Glucose, UA: NEGATIVE
KETONES UA: NEGATIVE
Ketones, UA: POSITIVE
Leukocytes, UA: NEGATIVE
Leukocytes, UA: NEGATIVE
Nitrite, UA: NEGATIVE
Nitrite, UA: NEGATIVE
Protein, UA: POSITIVE — AB
Protein, UA: NEGATIVE
Spec Grav, UA: >=1.03 — AB (ref 1.010–1.025)
Spec Grav, UA: 1.025 (ref 1.010–1.025)
Urobilinogen, UA: 0.2 E.U./dL
Urobilinogen, UA: 0.2 E.U./dL
pH, UA: 5.5 (ref 5.0–8.0)
pH, UA: 5 (ref 5.0–8.0)

## 2018-09-21 NOTE — Addendum Note (Signed)
Addended by: Elie Confer on: 09/21/2018 08:39 AM   Modules accepted: Orders

## 2018-09-21 NOTE — Addendum Note (Signed)
Addended by: Gwynne Edinger on: 09/21/2018 09:59 AM   Modules accepted: Orders

## 2018-09-21 NOTE — Telephone Encounter (Signed)
The initial test was negative, but the culture will be pending for several days

## 2018-09-21 NOTE — Telephone Encounter (Signed)
Copied from [place] [id number]. Topic: General - Other >> Sep 19, 2018  2:53 PM Virl Axe D wrote: Reason for CRM: Pt's wife Lavonna Monarch called to follow up on lab results from Urine sample. Please advise. CB#[phone]

## 2018-09-21 NOTE — Telephone Encounter (Signed)
Called and spoke with pts wife and she stated that the pt came in again this morning and left another urine specimen---this new urine was dipped this morning and updated in the chart---please review this updated test.  Thanks

## 2018-09-22 LAB — URINE CULTURE
MICRO NUMBER:: 65434
RESULT: NO GROWTH
SPECIMEN QUALITY:: ADEQUATE

## 2018-09-22 NOTE — Telephone Encounter (Signed)
The second UA is also negative. Waiting on the culture results.

## 2018-09-25 NOTE — Telephone Encounter (Signed)
Called and spoke with pts wife and she is aware of results of the urine culture.  She stated that they have gone off of the vitamins to see if this helps with his urine odor.  She stated that this has helped some and they will continue to try this to see if this clears up more.

## 2018-09-27 ENCOUNTER — Telehealth: Payer: Self-pay

## 2018-09-27 NOTE — Telephone Encounter (Signed)
Author phoned pt. to set up initial AWV. Author explained in depth purpose of visit, but pt. declined at this time. "I'm really very well taken care of" pt. stated. Will update master list for careguide.

## 2018-10-11 ENCOUNTER — Ambulatory Visit (INDEPENDENT_AMBULATORY_CARE_PROVIDER_SITE_OTHER): Payer: Medicare Other | Admitting: General Practice

## 2018-10-11 ENCOUNTER — Encounter: Payer: Self-pay | Admitting: Family Medicine

## 2018-10-11 ENCOUNTER — Ambulatory Visit (INDEPENDENT_AMBULATORY_CARE_PROVIDER_SITE_OTHER): Payer: Medicare Other | Admitting: Family Medicine

## 2018-10-11 VITALS — BP 88/52 | HR 77 | Temp 101.8°F

## 2018-10-11 DIAGNOSIS — R509 Fever, unspecified: Secondary | ICD-10-CM | POA: Diagnosis not present

## 2018-10-11 DIAGNOSIS — J069 Acute upper respiratory infection, unspecified: Secondary | ICD-10-CM | POA: Diagnosis not present

## 2018-10-11 DIAGNOSIS — I4891 Unspecified atrial fibrillation: Secondary | ICD-10-CM | POA: Diagnosis not present

## 2018-10-11 LAB — POCT INR: INR: 1.8 — AB (ref 2.0–3.0)

## 2018-10-11 LAB — POCT INFLUENZA A/B
Influenza A, POC: NEGATIVE
Influenza B, POC: NEGATIVE

## 2018-10-11 MED ORDER — BENZONATATE 200 MG PO CAPS: 200.0000 mg | ORAL_CAPSULE | Freq: Two times a day (BID) | ORAL | 0 refills | Status: DC | PRN

## 2018-10-11 NOTE — Progress Notes (Signed)
   Subjective:    Patient ID: CAPTAIN BLUCHER, male    DOB: 07/30/1941, 78 y.o.   MRN: [id number]  HPI Here with his wife for the sudden onset yesterday morning of fever, body aches, fatigue and a dry cough. No ST or NVD.  Drinking plenty of fluids. Using Tylenol and Benzonatate.    Review of Systems  Constitutional: Positive for fatigue and fever.  HENT: Negative.   Eyes: Negative.   Respiratory: Positive for cough.   Cardiovascular: Negative.   Gastrointestinal: Negative.   Genitourinary: Negative.        Objective:   Physical Exam Constitutional:      Appearance: He is ill-appearing.  HENT:     Right Ear: Tympanic membrane and ear canal normal.     Left Ear: Tympanic membrane and ear canal normal.     Nose: Nose normal.     Mouth/Throat:     Pharynx: Oropharynx is clear.  Eyes:     Conjunctiva/sclera: Conjunctivae normal.  Pulmonary:     Effort: Pulmonary effort is normal. No respiratory distress.     Breath sounds: Normal breath sounds. No stridor. No wheezing, rhonchi or rales.  Abdominal:     General: Abdomen is flat. Bowel sounds are normal. There is no distension.     Palpations: Abdomen is soft. There is no mass.     Tenderness: There is no abdominal tenderness. There is no guarding or rebound.     Hernia: No hernia is present.  Lymphadenopathy:     Cervical: No cervical adenopathy.  Neurological:     Mental Status: He is alert.           Assessment & Plan:  Viral URI. Drink fluids, use Tylenol and Benzonatate prn. Recheck prn. Alysia Penna, MD

## 2018-10-11 NOTE — Patient Instructions (Signed)
Pre visit review using our clinic review tool, if applicable. No additional management support is needed unless otherwise documented below in the visit note.  Please take 1 tablet today (2/5) and then continue to take 1/2 tab on Mon/Wed and Sat. And 1 tablet all other days.  Re-check in 4 weeks.

## 2018-11-08 ENCOUNTER — Ambulatory Visit: Payer: Medicare Other | Admitting: General Practice

## 2018-11-13 ENCOUNTER — Ambulatory Visit (INDEPENDENT_AMBULATORY_CARE_PROVIDER_SITE_OTHER): Payer: Medicare Other | Admitting: General Practice

## 2018-11-13 DIAGNOSIS — I4891 Unspecified atrial fibrillation: Secondary | ICD-10-CM | POA: Diagnosis not present

## 2018-11-13 LAB — POCT INR: INR: 2.9 (ref 2.0–3.0)

## 2018-11-13 NOTE — Patient Instructions (Addendum)
Pre visit review using our clinic review tool, if applicable. No additional management support is needed unless otherwise documented below in the visit note.  Continue to take 1/2 tab on Mon/Wed and Sat. And 1 tablet all other days.  Re-check in 4 weeks.   

## 2018-11-15 DIAGNOSIS — I4819 Other persistent atrial fibrillation: Secondary | ICD-10-CM | POA: Diagnosis not present

## 2018-11-15 DIAGNOSIS — I2510 Atherosclerotic heart disease of native coronary artery without angina pectoris: Secondary | ICD-10-CM | POA: Diagnosis not present

## 2018-11-15 DIAGNOSIS — R001 Bradycardia, unspecified: Secondary | ICD-10-CM | POA: Diagnosis not present

## 2018-11-15 DIAGNOSIS — I10 Essential (primary) hypertension: Secondary | ICD-10-CM | POA: Diagnosis not present

## 2018-11-15 DIAGNOSIS — I495 Sick sinus syndrome: Secondary | ICD-10-CM | POA: Diagnosis not present

## 2018-11-30 DIAGNOSIS — Z45010 Encounter for checking and testing of cardiac pacemaker pulse generator [battery]: Secondary | ICD-10-CM | POA: Diagnosis not present

## 2018-11-30 DIAGNOSIS — Z79899 Other long term (current) drug therapy: Secondary | ICD-10-CM | POA: Diagnosis not present

## 2018-11-30 DIAGNOSIS — Z955 Presence of coronary angioplasty implant and graft: Secondary | ICD-10-CM | POA: Diagnosis not present

## 2018-11-30 DIAGNOSIS — I4819 Other persistent atrial fibrillation: Secondary | ICD-10-CM | POA: Diagnosis not present

## 2018-11-30 DIAGNOSIS — T82111A Breakdown (mechanical) of cardiac pulse generator (battery), initial encounter: Secondary | ICD-10-CM | POA: Diagnosis not present

## 2018-11-30 DIAGNOSIS — Z01818 Encounter for other preprocedural examination: Secondary | ICD-10-CM | POA: Diagnosis not present

## 2018-11-30 DIAGNOSIS — E785 Hyperlipidemia, unspecified: Secondary | ICD-10-CM | POA: Diagnosis not present

## 2018-11-30 DIAGNOSIS — I10 Essential (primary) hypertension: Secondary | ICD-10-CM | POA: Diagnosis not present

## 2018-11-30 DIAGNOSIS — Z7901 Long term (current) use of anticoagulants: Secondary | ICD-10-CM | POA: Diagnosis not present

## 2018-11-30 DIAGNOSIS — I4430 Unspecified atrioventricular block: Secondary | ICD-10-CM | POA: Diagnosis not present

## 2018-11-30 DIAGNOSIS — I2510 Atherosclerotic heart disease of native coronary artery without angina pectoris: Secondary | ICD-10-CM | POA: Diagnosis not present

## 2018-12-11 ENCOUNTER — Ambulatory Visit: Payer: Medicare Other

## 2019-01-25 ENCOUNTER — Other Ambulatory Visit: Payer: Self-pay | Admitting: Family Medicine

## 2019-01-29 ENCOUNTER — Other Ambulatory Visit: Payer: Self-pay | Admitting: Family Medicine

## 2019-01-31 NOTE — Telephone Encounter (Signed)
Please advise. I do not see this on the patients medication list. Should the patient still be taking this?

## 2019-02-09 ENCOUNTER — Encounter: Payer: Self-pay | Admitting: Family Medicine

## 2019-02-09 ENCOUNTER — Ambulatory Visit (INDEPENDENT_AMBULATORY_CARE_PROVIDER_SITE_OTHER): Payer: Medicare Other | Admitting: Family Medicine

## 2019-02-09 ENCOUNTER — Other Ambulatory Visit: Payer: Self-pay

## 2019-02-09 DIAGNOSIS — S92525A Nondisplaced fracture of medial phalanx of left lesser toe(s), initial encounter for closed fracture: Secondary | ICD-10-CM

## 2019-02-09 NOTE — Progress Notes (Signed)
Subjective:    Patient ID: Tanner Hensley, male    DOB: 08/10/1941, 78 y.o.   MRN: [id number]  HPI Virtual Visit via Video Note  I connected with the patient on 02/09/19 at  1:45 PM EDT by a video enabled telemedicine application and verified that I am speaking with the correct person using two identifiers.  Location patient: home Location provider:work or home office Persons participating in the virtual visit: patient, provider  I discussed the limitations of evaluation and management by telemedicine and the availability of in person appointments. The patient expressed understanding and agreed to proceed.   HPI: Here to check his left 2nd toe. About 5 weeks ago he stubbed on some gravel in his driveway, and it swelled immediately. Over the next week it turned black and blue, then this faded to a pink. He iced it for a few days and buddy taped it to the next toe. He has been wearing boots outside but going barefoot inside the house. He has also knocked the toe on furniture several times after the first injury. It is still a bit swollen and a bit tender. He is in the process of moving and he has been on his feet packing and moving boxes for several weeks.    ROS: See pertinent positives and negatives per HPI.  Past Medical History:  Diagnosis Date  . Arthritis    sees Dr. Charlestine Night  . Atrial fibrillation ([place])    (Dr. Phylliss Blakes at [facility] and Dr. Lovena Le at [facility]. [facility]umatic[facility] (Dr. Charlestine Night)  . CHF (congestive heart failure) (HCC)    NOT SURE WHEN  . Coronary artery disease   . Diabetes mellitus type II    DIET CONTROLLED AND LOST 40 LBS  . Dysrhythmia    chronic a=fib with cardiac ablations  . GERD (gastroesophageal reflux disease)   . Gout   . High cholesterol   . Hypertension   . Insomnia   . Memory loss   . Neuromuscular disorder ([place])    POLY MYALGIA   RHEUMATICA  . Polymyalgia rheumatica ([place])    sees Dr. Charlestine Night   .  Presence of permanent cardiac pacemaker    [employer]  08/2011  . Urticaria     Past Surgical History:  Procedure Laterality Date  . ATRIAL FIBRILLATION ABLATION  2006, 2008, 2013   at [facility]  by Dr. Ola Spurr  . BACK SURGERY    . CARDIAC CATHETERIZATION     has had multiple, but last one 2017 (10/13/2016)  . COLONOSCOPY W/ BIOPSIES AND POLYPECTOMY  04/05/2017   per Dr. Oretha Caprice, no polyps, no repeats needed   . CORONARY ANGIOPLASTY WITH STENT PLACEMENT  ~ 2014   "he has 2 stents" (10/13/2016)  . ESOPHAGOGASTRODUODENOSCOPY    . INGUINAL HERNIA REPAIR Right 11/28/2009   per Dr. Donnie Mesa  . KNEE ARTHROSCOPY Left 02/03/2010   per Dr. Lindwood Qua  . POSTERIOR LUMBAR FUSION  10/14/2016  . SPINE SURGERY  10/13/2016   per Dr. Lynann Bologna, L3-4 fusion  . TONSILLECTOMY      Family History  Problem Relation Age of Onset  . Coronary artery disease Other   . Hypertension Other   . Heart disease Other   . Heart disease Mother   . Heart attack Father   . Heart disease Sister   . Diabetes Sister   . Hypertension Sister   . Other Brother        brain  tumor  . Hypertension Brother   . Heart disease Brother      Current Outpatient Medications:  .  acetaminophen (TYLENOL) 500 MG tablet, Take 1,000 mg by mouth at bedtime as needed. , Disp: , Rfl:  .  alendronate (FOSAMAX) 70 MG tablet, TAKE 1 TABLET BY MOUTH  EVERY 7 DAYS WITH FULL  GLASS OF WATER, Disp: 12 tablet, Rfl: 3 .  aspirin EC 81 MG tablet, Take 81 mg by mouth daily., Disp: , Rfl:  .  atenolol (TENORMIN) 25 MG tablet, Take 25 mg by mouth daily., Disp: , Rfl: 0 .  b complex vitamins tablet, Take 1 tablet by mouth daily., Disp: , Rfl:  .  benzonatate (TESSALON) 200 MG capsule, Take 1 capsule (200 mg total) by mouth 2 (two) times daily as needed for cough., Disp: 60 capsule, Rfl: 0 .  Biotin 1000 MCG tablet, Take 1,000 mcg by mouth daily., Disp: , Rfl:  .  calcium citrate-vitamin D (CITRACAL+D) 315-200  MG-UNIT tablet, Take 1 tablet by mouth 2 (two) times daily., Disp: , Rfl:  .  diphenhydramine-acetaminophen (TYLENOL PM) 25-500 MG TABS tablet, Take 1 tablet by mouth at bedtime as needed., Disp: , Rfl:  .  donepezil (ARICEPT) 10 MG tablet, TAKE 1 TABLET BY MOUTH AT  BEDTIME, Disp: 90 tablet, Rfl: 3 .  lactase (LACTAID) 3000 units tablet, Take by mouth 3 (three) times daily with meals., Disp: , Rfl:  .  memantine (NAMENDA) 10 MG tablet, TAKE 1 TABLET BY MOUTH  TWICE A DAY, Disp: 180 tablet, Rfl: 3 .  meperidine (DEMEROL) 50 MG/5ML solution, Take 2.5 mLs (25 mg total) by mouth every 4 (four) hours as needed., Disp: 30 mL, Rfl: 0 .  Multiple Vitamin (MULTIVITAMIN WITH MINERALS) TABS tablet, Take 1 tablet by mouth daily. SENIOR MULTIVITAMIN, Disp: , Rfl:  .  olmesartan (BENICAR) 20 MG tablet, Take 1 tablet (20 mg total) by mouth daily., Disp: 90 tablet, Rfl: 3 .  predniSONE (DELTASONE) 5 MG tablet, Take 1 tablet (5 mg total) by mouth daily with breakfast. Take 2 tabs a day for 7 days, then take one tab a day for 7 days, then stop, Disp: 5 tablet, Rfl: 0 .  vancomycin (VANCOCIN) 125 MG capsule, Take 1 capsule (125 mg total) by mouth 4 (four) times daily., Disp: 4 capsule, Rfl: 0 .  warfarin (COUMADIN) 5 MG tablet, Take 1 tablet daily except take 1/2 tablet on Mon Wed and Sat or TAKE AS DIRECTED BY ANTICOAGULATION CLINIC  90 DAY SUPPLY, Disp: 90 tablet, Rfl: 1  EXAM:  VITALS per patient if applicable:  GENERAL: alert, oriented, appears well and in no acute distress  HEENT: atraumatic, conjunttiva clear, no obvious abnormalities on inspection of external nose and ears  NECK: normal movements of the head and neck  LUNGS: on inspection no signs of respiratory distress, breathing rate appears normal, no obvious gross SOB, gasping or wheezing  CV: no obvious cyanosis  MS: moves all visible extremities without noticeable abnormality. The left 2nd toe is slightly swollen and pni on the distal half.  Alignment is normal.   PSYCH/NEURO: pleasant and cooperative, no obvious depression or anxiety, speech and thought processing grossly intact  ASSESSMENT AND PLAN: He likely has fractured the toe, but since the alignment is good there is not much else to do but to allow it to heal. He has been on his feet continuously since then, so I advised him to take it easy, spend more times off his  feet, and to elevated the foot. He is to wear a supportive shoe (like his [employer]ics waling shoes) at all times and no never go barefoot. Recheck prn. Alysia Penna, MD  Discussed the following assessment and plan:  No diagnosis found.     I discussed the assessment and treatment plan with the patient. The patient was provided an opportunity to ask questions and all were answered. The patient agreed with the plan and demonstrated an understanding of the instructions.   The patient was advised to call back or seek an in-person evaluation if the symptoms worsen or if the condition fails to improve as anticipated.     Review of Systems     Objective:   Physical Exam        Assessment & Plan:

## 2019-02-13 ENCOUNTER — Encounter: Payer: Self-pay | Admitting: Family Medicine

## 2019-02-13 ENCOUNTER — Other Ambulatory Visit: Payer: Self-pay

## 2019-02-13 ENCOUNTER — Ambulatory Visit (INDEPENDENT_AMBULATORY_CARE_PROVIDER_SITE_OTHER): Payer: Medicare Other | Admitting: Family Medicine

## 2019-02-13 VITALS — BP 140/72 | HR 60 | Temp 97.9°F | Wt 157.1 lb

## 2019-02-13 DIAGNOSIS — M1990 Unspecified osteoarthritis, unspecified site: Secondary | ICD-10-CM | POA: Diagnosis not present

## 2019-02-13 DIAGNOSIS — S92535D Nondisplaced fracture of distal phalanx of left lesser toe(s), subsequent encounter for fracture with routine healing: Secondary | ICD-10-CM | POA: Diagnosis not present

## 2019-02-13 DIAGNOSIS — R413 Other amnesia: Secondary | ICD-10-CM | POA: Diagnosis not present

## 2019-02-13 NOTE — Progress Notes (Signed)
   Subjective:    Patient ID: Tanner Hensley, male    DOB: 09-26-1940, 78 y.o.   MRN: [id number]  HPI Here with his wife to follow up on several issues. We saw him for a virtual visit 3 days ago when he had injured the left 2nd toe. We felt it was likely fractured but it has been slowly improving since then. He has iced it and elevated the foot. He wears supportive shoes. Now he only has mild pain when walking on it. He does however complain of worsening arthritis pains in both hands. He wears copper lined gloves every day and he takes Tylenol. This does not help much with the pain unfortunately. His wife says his dementia has worsened a little in the past month, but she thinks stress has a lot to do with this. They are packing up their belongings and are getting ready to move to a condominium, and this has been stressful for both of them.    Review of Systems  Constitutional: Negative.   Respiratory: Negative.   Cardiovascular: Negative.   Musculoskeletal: Positive for arthralgias.       Objective:   Physical Exam Constitutional:      General: He is not in acute distress.    Appearance: Normal appearance.  Cardiovascular:     Rate and Rhythm: Normal rate. Rhythm irregular.     Pulses: Normal pulses.     Heart sounds: Normal heart sounds.  Pulmonary:     Effort: Pulmonary effort is normal.     Breath sounds: Normal breath sounds.  Musculoskeletal:     Comments: There hands appear normal, no swelling, but he is quite tender along most of the MCP joints. The wrists are normal. The left 2nd toe is slightly swollen and mildly tender at the tip. Alignment is good, ROM is full.   Neurological:     General: No focal deficit present.     Mental Status: He is alert and oriented to person, place, and time. Mental status is at baseline.           Assessment & Plan:  The toe fracture seems to be healing well. He will continue to wear supportive shoes and recheck prn. As for the  dementia, I agree that the stress of moving is having a great effect on this, and hopefully once they move into the new home and their lives settle down he will return to baseline. The hand pain is likely due to osteoarthritis but treating this is difficult since he cannot tolerate any narcotic medications and he cannot take any NSAIDs. We will refer him to Rheumatology to evaluate further.  Alysia Penna, MD

## 2019-02-14 ENCOUNTER — Other Ambulatory Visit: Payer: Self-pay

## 2019-02-14 ENCOUNTER — Ambulatory Visit (INDEPENDENT_AMBULATORY_CARE_PROVIDER_SITE_OTHER): Payer: Medicare Other | Admitting: General Practice

## 2019-02-14 DIAGNOSIS — I4891 Unspecified atrial fibrillation: Secondary | ICD-10-CM

## 2019-02-14 LAB — POCT INR: INR: 2.1 (ref 2.0–3.0)

## 2019-02-14 NOTE — Patient Instructions (Signed)
Pre visit review using our clinic review tool, if applicable. No additional management support is needed unless otherwise documented below in the visit note.  Continue to take 1/2 tab on Mon/Wed and Sat. And 1 tablet all other days.  Re-check in 4 weeks.

## 2019-02-16 ENCOUNTER — Telehealth: Payer: Self-pay | Admitting: Family Medicine

## 2019-02-16 NOTE — Telephone Encounter (Signed)
Copied from [place] [id number]. Topic: Quick Communication - Rx Refill/Question >> Feb 16, 2019 10:48 AM Scherrie Gerlach wrote: Medication: a med for toe nail fungus   Wife states thought dr Sarajane Jews was going to prescribe something for pt's toenail fungus, but not at the pharmacy yet. She would like Rx first, then referral to podiatry after they get moved and settled.  [id number] - [place], [place] (Phone) [phone] (Fax)

## 2019-02-16 NOTE — Telephone Encounter (Signed)
Dr. Fry please advise. Thanks  

## 2019-02-19 ENCOUNTER — Ambulatory Visit: Payer: Medicare Other

## 2019-02-19 MED ORDER — TERBINAFINE HCL 250 MG PO TABS: 250.0000 mg | ORAL_TABLET | Freq: Every day | ORAL | 1 refills | Status: DC

## 2019-02-19 NOTE — Telephone Encounter (Signed)
Medication has been sent to the pharmacy. 

## 2019-02-19 NOTE — Telephone Encounter (Signed)
Noted. Call in Terbinafine 250 mg daily, #90 with one rf

## 2019-02-20 DIAGNOSIS — M542 Cervicalgia: Secondary | ICD-10-CM | POA: Diagnosis not present

## 2019-02-20 DIAGNOSIS — M79641 Pain in right hand: Secondary | ICD-10-CM | POA: Diagnosis not present

## 2019-02-20 DIAGNOSIS — M150 Primary generalized (osteo)arthritis: Secondary | ICD-10-CM | POA: Diagnosis not present

## 2019-02-20 DIAGNOSIS — M79642 Pain in left hand: Secondary | ICD-10-CM | POA: Diagnosis not present

## 2019-03-22 ENCOUNTER — Telehealth: Payer: Self-pay | Admitting: Family Medicine

## 2019-03-22 DIAGNOSIS — M150 Primary generalized (osteo)arthritis: Secondary | ICD-10-CM | POA: Diagnosis not present

## 2019-03-22 DIAGNOSIS — M79642 Pain in left hand: Secondary | ICD-10-CM | POA: Diagnosis not present

## 2019-03-22 DIAGNOSIS — M542 Cervicalgia: Secondary | ICD-10-CM | POA: Diagnosis not present

## 2019-03-22 DIAGNOSIS — M79641 Pain in right hand: Secondary | ICD-10-CM | POA: Diagnosis not present

## 2019-03-22 NOTE — Telephone Encounter (Signed)
Patient would like a handicap sticker. °

## 2019-03-27 NOTE — Telephone Encounter (Signed)
Done, in your folder 

## 2019-03-27 NOTE — Telephone Encounter (Signed)
Patient is aware to pick this up. Nothing further needed. 

## 2019-03-28 ENCOUNTER — Ambulatory Visit (INDEPENDENT_AMBULATORY_CARE_PROVIDER_SITE_OTHER): Payer: Medicare Other | Admitting: General Practice

## 2019-03-28 ENCOUNTER — Telehealth: Payer: Self-pay | Admitting: Family Medicine

## 2019-03-28 ENCOUNTER — Other Ambulatory Visit: Payer: Self-pay

## 2019-03-28 DIAGNOSIS — I4891 Unspecified atrial fibrillation: Secondary | ICD-10-CM

## 2019-03-28 LAB — POCT INR: INR: 1.9 — AB (ref 2.0–3.0)

## 2019-03-28 NOTE — Patient Instructions (Addendum)
Pre visit review using our clinic review tool, if applicable. No additional management support is needed unless otherwise documented below in the visit note.  Take 1 tablet today and then continue to take 1/2 tab on Mon/Wed and Sat. And 1 tablet all other days.  Re-check in 4 weeks.

## 2019-03-28 NOTE — Telephone Encounter (Signed)
Patient came and dropped off handicap form for him and his wife to be filled out. Please call patient once forms are filled out,

## 2019-03-29 NOTE — Telephone Encounter (Signed)
Forms have been completed and placed up front for pick up. I spoke with the patients wife and she is aware.

## 2019-04-16 ENCOUNTER — Other Ambulatory Visit: Payer: Self-pay | Admitting: Family Medicine

## 2019-04-16 DIAGNOSIS — Z7901 Long term (current) use of anticoagulants: Secondary | ICD-10-CM

## 2019-04-24 ENCOUNTER — Other Ambulatory Visit: Payer: Self-pay | Admitting: Family Medicine

## 2019-04-25 ENCOUNTER — Ambulatory Visit (INDEPENDENT_AMBULATORY_CARE_PROVIDER_SITE_OTHER): Payer: Medicare Other | Admitting: General Practice

## 2019-04-25 ENCOUNTER — Other Ambulatory Visit: Payer: Self-pay | Admitting: General Practice

## 2019-04-25 DIAGNOSIS — I4891 Unspecified atrial fibrillation: Secondary | ICD-10-CM

## 2019-05-01 DIAGNOSIS — M79641 Pain in right hand: Secondary | ICD-10-CM | POA: Diagnosis not present

## 2019-05-01 DIAGNOSIS — M150 Primary generalized (osteo)arthritis: Secondary | ICD-10-CM | POA: Diagnosis not present

## 2019-05-01 DIAGNOSIS — M542 Cervicalgia: Secondary | ICD-10-CM | POA: Diagnosis not present

## 2019-05-01 DIAGNOSIS — M79642 Pain in left hand: Secondary | ICD-10-CM | POA: Diagnosis not present

## 2019-05-22 DIAGNOSIS — Z950 Presence of cardiac pacemaker: Secondary | ICD-10-CM | POA: Diagnosis not present

## 2019-06-15 ENCOUNTER — Ambulatory Visit (INDEPENDENT_AMBULATORY_CARE_PROVIDER_SITE_OTHER): Payer: Medicare Other | Admitting: *Deleted

## 2019-06-15 ENCOUNTER — Other Ambulatory Visit: Payer: Self-pay

## 2019-06-15 DIAGNOSIS — Z23 Encounter for immunization: Secondary | ICD-10-CM

## 2019-06-15 NOTE — Progress Notes (Signed)
Patient in office for flu vaccine. Flu vaccine administered with no reaction

## 2019-06-21 ENCOUNTER — Other Ambulatory Visit: Payer: Self-pay

## 2019-06-21 ENCOUNTER — Encounter: Payer: Self-pay | Admitting: Family Medicine

## 2019-06-21 ENCOUNTER — Ambulatory Visit (INDEPENDENT_AMBULATORY_CARE_PROVIDER_SITE_OTHER): Payer: Medicare Other | Admitting: Family Medicine

## 2019-06-21 VITALS — BP 120/60 | HR 70 | Temp 97.7°F | Ht 65.0 in | Wt 153.0 lb

## 2019-06-21 DIAGNOSIS — M353 Polymyalgia rheumatica: Secondary | ICD-10-CM | POA: Diagnosis not present

## 2019-06-21 DIAGNOSIS — E1169 Type 2 diabetes mellitus with other specified complication: Secondary | ICD-10-CM | POA: Diagnosis not present

## 2019-06-21 DIAGNOSIS — I4891 Unspecified atrial fibrillation: Secondary | ICD-10-CM

## 2019-06-21 DIAGNOSIS — N318 Other neuromuscular dysfunction of bladder: Secondary | ICD-10-CM

## 2019-06-21 DIAGNOSIS — N138 Other obstructive and reflux uropathy: Secondary | ICD-10-CM | POA: Diagnosis not present

## 2019-06-21 DIAGNOSIS — N401 Enlarged prostate with lower urinary tract symptoms: Secondary | ICD-10-CM

## 2019-06-21 DIAGNOSIS — E785 Hyperlipidemia, unspecified: Secondary | ICD-10-CM

## 2019-06-21 DIAGNOSIS — I10 Essential (primary) hypertension: Secondary | ICD-10-CM | POA: Diagnosis not present

## 2019-06-21 DIAGNOSIS — R413 Other amnesia: Secondary | ICD-10-CM

## 2019-06-21 LAB — CBC WITH DIFFERENTIAL/PLATELET
Basophils Absolute: 0 10*3/uL (ref 0.0–0.1)
Basophils Relative: 0.7 % (ref 0.0–3.0)
Eosinophils Absolute: 0.1 10*3/uL (ref 0.0–0.7)
Eosinophils Relative: 1.5 % (ref 0.0–5.0)
HCT: 39.7 % (ref 39.0–52.0)
Hemoglobin: 13.5 g/dL (ref 13.0–17.0)
Lymphocytes Relative: 16.8 % (ref 12.0–46.0)
Lymphs Abs: 1.1 10*3/uL (ref 0.7–4.0)
MCHC: 33.9 g/dL (ref 30.0–36.0)
MCV: 106.6 fl — ABNORMAL HIGH (ref 78.0–100.0)
Monocytes Absolute: 0.7 10*3/uL (ref 0.1–1.0)
Monocytes Relative: 11.1 % (ref 3.0–12.0)
Neutro Abs: 4.4 10*3/uL (ref 1.4–7.7)
Neutrophils Relative %: 69.9 % (ref 43.0–77.0)
Platelets: 149 10*3/uL — ABNORMAL LOW (ref 150.0–400.0)
RBC: 3.73 Mil/uL — ABNORMAL LOW (ref 4.22–5.81)
RDW: 12.5 % (ref 11.5–15.5)
WBC: 6.3 10*3/uL (ref 4.0–10.5)

## 2019-06-21 LAB — BASIC METABOLIC PANEL
BUN: 15 mg/dL (ref 6–23)
CO2: 27 mEq/L (ref 19–32)
Calcium: 9.7 mg/dL (ref 8.4–10.5)
Chloride: 106 mEq/L (ref 96–112)
Creatinine, Ser: 1 mg/dL (ref 0.40–1.50)
GFR: 72.11 mL/min (ref >60.00)
Glucose, Bld: 80 mg/dL (ref 70–99)
Potassium: 3.9 mEq/L (ref 3.5–5.1)
Sodium: 142 mEq/L (ref 135–145)

## 2019-06-21 LAB — POCT URINALYSIS DIPSTICK
Bilirubin, UA: NEGATIVE
Blood, UA: NEGATIVE
Glucose, UA: NEGATIVE
Ketones, UA: NEGATIVE
Leukocytes, UA: NEGATIVE
Nitrite, UA: NEGATIVE
Protein, UA: POSITIVE — AB
Spec Grav, UA: 1.025 (ref 1.010–1.025)
Urobilinogen, UA: 0.2 E.U./dL
pH, UA: 5.5 (ref 5.0–8.0)

## 2019-06-21 LAB — LIPID PANEL
Cholesterol: 194 mg/dL (ref 0–200)
HDL: 52.1 mg/dL (ref >39.00)
LDL Cholesterol: 121 mg/dL — ABNORMAL HIGH (ref 0–99)
NonHDL: 142.13
Total CHOL/HDL Ratio: 4
Triglycerides: 104 mg/dL (ref 0.0–149.0)
VLDL: 20.8 mg/dL (ref 0.0–40.0)

## 2019-06-21 LAB — PSA: PSA: 1.32 ng/mL (ref 0.10–4.00)

## 2019-06-21 LAB — HEMOGLOBIN A1C: Hgb A1c MFr Bld: 5.7 % (ref 4.6–6.5)

## 2019-06-21 LAB — TSH: TSH: 1.74 u[IU]/mL (ref 0.35–4.50)

## 2019-06-21 MED ORDER — SOLIFENACIN SUCCINATE 5 MG PO TABS: 5.0000 mg | ORAL_TABLET | Freq: Every day | ORAL | 2 refills | Status: DC

## 2019-06-21 NOTE — Progress Notes (Signed)
Subjective:    Patient ID: Tanner Hensley, male    DOB: November 17, 1940, 78 y.o.   MRN: [id number]  HPI Here with her wife to follow up on issues. The main problem has been his dementia, which has been slowly worsening. His memory has been slipping despite taking Aricept and Namenda. Otherwise he feels well. His BP is stable. His diabetes seems to be stable. His GERD is stable. He sees Dr. Deno Etienne and Dr. Lovena Le for atrial fibrillation and CHF, which have been well controlled.    Review of Systems  Constitutional: Negative.   HENT: Negative.   Eyes: Negative.   Respiratory: Negative.   Cardiovascular: Negative.   Gastrointestinal: Negative.   Genitourinary: Negative.   Musculoskeletal: Negative.   Skin: Negative.   Neurological: Negative.   Psychiatric/Behavioral: Negative.        Objective:   Physical Exam Constitutional:      General: He is not in acute distress.    Appearance: He is well-developed. He is not diaphoretic.  HENT:     Head: Normocephalic and atraumatic.     Right Ear: External ear normal.     Left Ear: External ear normal.     Nose: Nose normal.     Mouth/Throat:     Pharynx: No oropharyngeal exudate.  Eyes:     General: No scleral icterus.       Right eye: No discharge.        Left eye: No discharge.     Conjunctiva/sclera: Conjunctivae normal.     Pupils: Pupils are equal, round, and reactive to light.  Neck:     Musculoskeletal: Neck supple.     Thyroid: No thyromegaly.     Vascular: No JVD.     Trachea: No tracheal deviation.  Cardiovascular:     Rate and Rhythm: Normal rate and regular rhythm.     Heart sounds: Normal heart sounds. No murmur. No friction rub. No gallop.   Pulmonary:     Effort: Pulmonary effort is normal. No respiratory distress.     Breath sounds: Normal breath sounds. No wheezing or rales.  Chest:     Chest wall: No tenderness.  Abdominal:     General: Bowel sounds are normal. There is no distension.     Palpations:  Abdomen is soft. There is no mass.     Tenderness: There is no abdominal tenderness. There is no guarding or rebound.  Genitourinary:    Penis: Normal. No tenderness.      Scrotum/Testes: Normal.     Prostate: Normal.     Rectum: Normal. Guaiac result negative.  Musculoskeletal: Normal range of motion.        General: No tenderness.  Lymphadenopathy:     Cervical: No cervical adenopathy.  Skin:    General: Skin is warm and dry.     Coloration: Skin is not pale.     Findings: No erythema or rash.  Neurological:     Mental Status: He is alert and oriented to person, place, and time.     Cranial Nerves: No cranial nerve deficit.     Motor: No abnormal muscle tone.     Coordination: Coordination normal.     Deep Tendon Reflexes: Reflexes are normal and symmetric. Reflexes normal.  Psychiatric:        Behavior: Behavior normal.        Thought Content: Thought content normal.        Judgment: Judgment normal.  Assessment & Plan:  His HTN and CHF and atrial fib are stable. His GERD is stable. We will get fasting labs to check an A1c for the diabetes and a lipid panel. Alysia Penna, MD

## 2019-06-21 NOTE — Patient Instructions (Signed)
Health Maintenance Due  Topic Date Due  . FOOT EXAM  08/23/1950  . OPHTHALMOLOGY EXAM  08/23/1950  . HEMOGLOBIN A1C  10/07/2018    Depression screen [facility] 2/9 04/06/2018 01/06/2017 09/18/2015  Decreased Interest 0 0 0  Down, Depressed, Hopeless 0 0 0  PHQ - 2 Score 0 0 0  Some recent data might be hidden

## 2019-06-25 ENCOUNTER — Other Ambulatory Visit: Payer: Self-pay

## 2019-06-25 ENCOUNTER — Ambulatory Visit (INDEPENDENT_AMBULATORY_CARE_PROVIDER_SITE_OTHER): Payer: Medicare Other | Admitting: General Practice

## 2019-06-25 DIAGNOSIS — I4891 Unspecified atrial fibrillation: Secondary | ICD-10-CM

## 2019-06-25 LAB — POCT INR: INR: 1.2 — AB (ref 2.0–3.0)

## 2019-06-25 NOTE — Patient Instructions (Addendum)
Pre visit review using our clinic review tool, if applicable. No additional management support is needed unless otherwise documented below in the visit note.  Take 1 tablet today(10/19), take 1 1/2 tablets tomorrow (10/20), take 1 tablet Wednesday (10/21) and then continue to take 1 tablet all days except 1/2 tablet on Mon Wed and Saturdays.  Re-check in 1 week.

## 2019-07-02 ENCOUNTER — Other Ambulatory Visit: Payer: Self-pay

## 2019-07-02 ENCOUNTER — Ambulatory Visit (INDEPENDENT_AMBULATORY_CARE_PROVIDER_SITE_OTHER): Payer: Medicare Other | Admitting: General Practice

## 2019-07-02 ENCOUNTER — Telehealth: Payer: Self-pay | Admitting: Family Medicine

## 2019-07-02 DIAGNOSIS — I4891 Unspecified atrial fibrillation: Secondary | ICD-10-CM | POA: Diagnosis not present

## 2019-07-02 LAB — POCT INR: INR: 1.5 — AB (ref 2.0–3.0)

## 2019-07-02 NOTE — Telephone Encounter (Signed)
Patient is calling to reschedule his coumadin appt with Jenny Reichmann [phone]

## 2019-07-02 NOTE — Patient Instructions (Signed)
Pre visit review using our clinic review tool, if applicable. No additional management support is needed unless otherwise documented below in the visit note.  Take 1 (5 mg) daily except 1/2 tablet on Sundays only.  Re-check in 2 weeks.

## 2019-07-16 ENCOUNTER — Ambulatory Visit: Payer: Medicare Other

## 2019-07-18 ENCOUNTER — Ambulatory Visit (INDEPENDENT_AMBULATORY_CARE_PROVIDER_SITE_OTHER): Payer: Medicare Other | Admitting: General Practice

## 2019-07-18 ENCOUNTER — Other Ambulatory Visit: Payer: Self-pay | Admitting: Family Medicine

## 2019-07-18 ENCOUNTER — Other Ambulatory Visit: Payer: Self-pay

## 2019-07-18 DIAGNOSIS — Z7901 Long term (current) use of anticoagulants: Secondary | ICD-10-CM

## 2019-07-18 LAB — POCT INR: INR: 1.4 — AB (ref 2.0–3.0)

## 2019-07-18 NOTE — Patient Instructions (Addendum)
Pre visit review using our clinic review tool, if applicable. No additional management support is needed unless otherwise documented below in the visit note.  Take 1 (5 mg) everyday through next Tuesday.  Re-check in 1 week.

## 2019-07-25 ENCOUNTER — Other Ambulatory Visit: Payer: Self-pay

## 2019-07-25 ENCOUNTER — Ambulatory Visit: Payer: Medicare Other | Admitting: General Practice

## 2019-07-25 DIAGNOSIS — I4891 Unspecified atrial fibrillation: Secondary | ICD-10-CM

## 2019-07-25 LAB — POCT INR: INR: 1.8 — AB (ref 2.0–3.0)

## 2019-07-25 NOTE — Patient Instructions (Signed)
Pre visit review using our clinic review tool, if applicable. No additional management support is needed unless otherwise documented below in the visit note.  Take a total of 1 1/2 tablets today (11/18) and then change dosage and take 1 (5 mg) everyday except 1 1/2 tablets( 7.5 mg) on Wednesdays.  Re-check in 3 weeks.

## 2019-08-13 ENCOUNTER — Telehealth: Payer: Self-pay

## 2019-08-13 DIAGNOSIS — M79671 Pain in right foot: Secondary | ICD-10-CM

## 2019-08-13 DIAGNOSIS — M79672 Pain in left foot: Secondary | ICD-10-CM

## 2019-08-13 NOTE — Addendum Note (Signed)
Addended by: Alysia Penna A on: 08/13/2019 02:27 PM   Modules accepted: Orders

## 2019-08-13 NOTE — Telephone Encounter (Signed)
I did the referral 

## 2019-08-13 NOTE — Telephone Encounter (Signed)
Spoke to pt spouse and she stated that they spoke to Dr.Fry about this at Pt CPE.

## 2019-08-13 NOTE — Telephone Encounter (Signed)
Okay for referral?

## 2019-08-13 NOTE — Telephone Encounter (Signed)
Copied from [place] [phone]. Topic: General - Inquiry >> Aug 13, 2019 11:42 AM Alease Frame wrote: Reason for CRM: Patient wife called in wanting to hear back from Dr Sarajane Jews regarding A request for a podiatrist.   Call back [phone]

## 2019-08-14 NOTE — Telephone Encounter (Signed)
Left message to return phone call. Called pt advising that we have sent the referral in.

## 2019-08-15 ENCOUNTER — Ambulatory Visit (INDEPENDENT_AMBULATORY_CARE_PROVIDER_SITE_OTHER): Payer: Medicare Other | Admitting: General Practice

## 2019-08-15 ENCOUNTER — Other Ambulatory Visit: Payer: Self-pay

## 2019-08-15 ENCOUNTER — Telehealth: Payer: Self-pay | Admitting: Family Medicine

## 2019-08-15 DIAGNOSIS — I4891 Unspecified atrial fibrillation: Secondary | ICD-10-CM

## 2019-08-15 LAB — POCT INR: INR: 2 (ref 2.0–3.0)

## 2019-08-15 NOTE — Telephone Encounter (Signed)
Disability plate form to be filled out- placed in dr's folder.  Call [phone] upon completion.

## 2019-08-15 NOTE — Telephone Encounter (Signed)
FYI. Noted

## 2019-08-15 NOTE — Telephone Encounter (Signed)
Spoke to [place] and she stated she received my VM. Gave her the referral information. No further action needed.

## 2019-08-15 NOTE — Patient Instructions (Signed)
Pre visit review using our clinic review tool, if applicable. No additional management support is needed unless otherwise documented below in the visit note.  Continue to take 1 (5 mg) everyday except 1 1/2 tablets( 7.5 mg) on Wednesdays.  Re-check in 4 weeks.

## 2019-08-16 ENCOUNTER — Encounter: Payer: Self-pay | Admitting: Podiatry

## 2019-08-16 ENCOUNTER — Ambulatory Visit: Payer: Medicare Other | Admitting: Podiatry

## 2019-08-16 VITALS — Resp 16

## 2019-08-16 DIAGNOSIS — L84 Corns and callosities: Secondary | ICD-10-CM

## 2019-08-16 DIAGNOSIS — M79675 Pain in left toe(s): Secondary | ICD-10-CM

## 2019-08-16 DIAGNOSIS — M79674 Pain in right toe(s): Secondary | ICD-10-CM

## 2019-08-16 DIAGNOSIS — Z7901 Long term (current) use of anticoagulants: Secondary | ICD-10-CM | POA: Diagnosis not present

## 2019-08-16 DIAGNOSIS — B351 Tinea unguium: Secondary | ICD-10-CM

## 2019-08-20 NOTE — Telephone Encounter (Signed)
Patient picked up form 1211/2020  No charge

## 2019-08-21 DIAGNOSIS — Z95810 Presence of automatic (implantable) cardiac defibrillator: Secondary | ICD-10-CM | POA: Diagnosis not present

## 2019-08-27 NOTE — Progress Notes (Signed)
Subjective:   Patient ID: Tanner Hensley, male   DOB: 78 y.o.   MRN: [id number]   HPI 78 year old male presents the office today for concerns of thick, discolored toenails that he cannot trim himself as well as her calluses which are causing discomfort mostly to his left second toe.  He denies any ulceration denies any redness or drainage or any swelling to the toenail or callus sites.  He said no recent treatment.  Has no other concerns today  Last A1c 5.7 on 06/21/2019  He is on Coumadin  ROS Past Medical History:  Diagnosis Date  . Arthritis    sees Dr. Charlestine Night  . Atrial fibrillation ([place])    (Dr. Phylliss Blakes at [facility] and Dr. Lovena Le at [facility]. Goes to [facility] polymyalgia rheumatica (Dr. Charlestine Night)  . CHF (congestive heart failure) (HCC)    NOT SURE WHEN  . Coronary artery disease   . Diabetes mellitus type II    DIET CONTROLLED AND LOST 40 LBS  . Dysrhythmia    chronic a=fib with cardiac ablations  . GERD (gastroesophageal reflux disease)   . Gout   . High cholesterol   . Hypertension   . Insomnia   . Memory loss   . Neuromuscular disorder ([place])    POLY MYALGIA   RHEUMATICA  . Polymyalgia rheumatica ([place])    sees Dr. Charlestine Night   . Presence of permanent cardiac pacemaker    [employer]  08/2011  . Urticaria     Past Surgical History:  Procedure Laterality Date  . ATRIAL FIBRILLATION ABLATION  2006, 2008, 2013   at [facility]  by Dr. Ola Spurr  . BACK SURGERY    . CARDIAC CATHETERIZATION     has had multiple, but last one 2017 (10/13/2016)  . COLONOSCOPY W/ BIOPSIES AND POLYPECTOMY  04/05/2017   per Dr. Oretha Caprice, no polyps, no repeats needed   . CORONARY ANGIOPLASTY WITH STENT PLACEMENT  ~ 2014   "he has 2 stents" (10/13/2016)  . ESOPHAGOGASTRODUODENOSCOPY    . INGUINAL HERNIA REPAIR Right 11/28/2009   per Dr. Donnie Mesa  . KNEE ARTHROSCOPY Left 02/03/2010   per Dr. Lindwood Qua  . POSTERIOR LUMBAR FUSION  10/14/2016  . SPINE  SURGERY  10/13/2016   per Dr. Lynann Bologna, L3-4 fusion  . TONSILLECTOMY       Current Outpatient Medications:  .  acetaminophen (TYLENOL) 500 MG tablet, Take 1,000 mg by mouth at bedtime as needed. , Disp: , Rfl:  .  alendronate (FOSAMAX) 70 MG tablet, TAKE 1 TABLET BY MOUTH  EVERY 7 DAYS WITH FULL  GLASS OF WATER, Disp: 12 tablet, Rfl: 3 .  aspirin EC 81 MG tablet, Take 81 mg by mouth daily., Disp: , Rfl:  .  atenolol (TENORMIN) 25 MG tablet, Take 25 mg by mouth daily., Disp: , Rfl: 0 .  b complex vitamins tablet, Take 1 tablet by mouth daily., Disp: , Rfl:  .  Biotin 1000 MCG tablet, Take 1,000 mcg by mouth daily., Disp: , Rfl:  .  calcium citrate-vitamin D (CITRACAL+D) 315-200 MG-UNIT tablet, Take 1 tablet by mouth 2 (two) times daily., Disp: , Rfl:  .  donepezil (ARICEPT) 10 MG tablet, TAKE 1 TABLET BY MOUTH AT  BEDTIME, Disp: 90 tablet, Rfl: 3 .  memantine (NAMENDA) 10 MG tablet, TAKE 1 TABLET BY MOUTH  TWICE A DAY, Disp: 180 tablet, Rfl: 3 .  Multiple Vitamin (MULTIVITAMIN WITH MINERALS) TABS tablet, Take 1 tablet by mouth daily. SENIOR  MULTIVITAMIN, Disp: , Rfl:  .  olmesartan (BENICAR) 20 MG tablet, TAKE 1 TABLET BY MOUTH  DAILY, Disp: 90 tablet, Rfl: 3 .  solifenacin (VESICARE) 5 MG tablet, Take 1 tablet (5 mg total) by mouth daily., Disp: 30 tablet, Rfl: 2 .  warfarin (COUMADIN) 5 MG tablet, TAKE 1 TABLET BY MOUTH  DAILY ,  EXCEPT 1/2 TABLET  ON MON,  WED,  AND SAT OR  AS DIRECTED BY  ANTICOAGULATION CLINIC   90 day, Disp: 90 tablet, Rfl: 1  Allergies  Allergen Reactions  . Codeine Rash and Other (See Comments)    Dizziness, low blood pressure and heart rate  . Hydrocodone-Acetaminophen Rash and Other (See Comments)    Dizziness, low blood pressure and heart rate  . Tramadol Nausea And Vomiting  . Lipitor [Atorvastatin] Other (See Comments)    Myalgias   . Robaxin [Methocarbamol] Other (See Comments)    Dropped blood pressure too much  . Zocor [Simvastatin] Other (See  Comments)    myalgias         Objective:  Physical Exam  General: AAO x3, NAD  Dermatological: Nails are hypertrophic, dystrophic, brittle, discolored, elongated 10. No surrounding redness or drainage. Tenderness nails 1-5 bilaterally. No open lesions or pre-ulcerative lesions are identified today.  Hyperkeratotic lesion of the distal half of the second toe.  Upon debridement no ongoing ulceration drainage or any signs of infection. There are no other open sores, no preulcerative lesions, no rash or signs of infection present.  Vascular: Dorsalis Pedis artery and Posterior Tibial artery pedal pulses are 2/4 bilateral with immedate capillary fill time.  There is no pain with calf compression, swelling, warmth, erythema.   Neruologic: Grossly intact via light touch bilateral.   Musculoskeletal: Hammertoes present.  Muscular strength 5/5 in all groups tested bilateral.     Assessment:   Symptomatic onychomycosis, hyperkeratotic lesions on Coumadin     Plan:  -Treatment options discussed including all alternatives, risks, and complications -Etiology of symptoms were discussed -Nails debrided 10 without complications or bleeding. -Hyperkeratotic lesion subdebrided x1 without any complications or bleeding -Daily foot inspection -Follow-up in 3 months or sooner if any problems arise. In the meantime, encouraged to call the office with any questions, concerns, change in symptoms.   Celesta Gentile, DPM

## 2019-09-11 ENCOUNTER — Other Ambulatory Visit: Payer: Self-pay

## 2019-09-12 ENCOUNTER — Other Ambulatory Visit: Payer: Self-pay | Admitting: General Practice

## 2019-09-12 ENCOUNTER — Ambulatory Visit (INDEPENDENT_AMBULATORY_CARE_PROVIDER_SITE_OTHER): Payer: Medicare Other | Admitting: General Practice

## 2019-09-12 DIAGNOSIS — Z7901 Long term (current) use of anticoagulants: Secondary | ICD-10-CM

## 2019-09-12 LAB — POCT INR: INR: 1.9 — AB (ref 2.0–3.0)

## 2019-09-12 MED ORDER — WARFARIN SODIUM 5 MG PO TABS: ORAL_TABLET | ORAL | 2 refills | Status: DC

## 2019-09-12 NOTE — Patient Instructions (Addendum)
Pre visit review using our clinic review tool, if applicable. No additional management support is needed unless otherwise documented below in the visit note.  Please take an additional 1/2 tablet today (1/6) and then change dosage and take 1 tablet (5 mg) everyday except 1 1/2 tablets( 7.5 mg) on Wednesdays and Saturdays.  Re-check in 4 weeks.

## 2019-10-09 ENCOUNTER — Other Ambulatory Visit: Payer: Self-pay

## 2019-10-10 ENCOUNTER — Ambulatory Visit (INDEPENDENT_AMBULATORY_CARE_PROVIDER_SITE_OTHER): Payer: Medicare Other | Admitting: General Practice

## 2019-10-10 DIAGNOSIS — I4891 Unspecified atrial fibrillation: Secondary | ICD-10-CM

## 2019-10-10 LAB — POCT INR: INR: 2.2 (ref 2.0–3.0)

## 2019-10-10 NOTE — Patient Instructions (Signed)
Pre visit review using our clinic review tool, if applicable. No additional management support is needed unless otherwise documented below in the visit note.  Continue to take 1 tablet (5 mg) everyday except 1 1/2 tablets( 7.5 mg) on Wednesdays and Saturdays.  Re-check in 4 weeks.

## 2019-10-31 DIAGNOSIS — H02831 Dermatochalasis of right upper eyelid: Secondary | ICD-10-CM | POA: Diagnosis not present

## 2019-10-31 DIAGNOSIS — H25813 Combined forms of age-related cataract, bilateral: Secondary | ICD-10-CM | POA: Diagnosis not present

## 2019-10-31 DIAGNOSIS — H43812 Vitreous degeneration, left eye: Secondary | ICD-10-CM | POA: Diagnosis not present

## 2019-10-31 DIAGNOSIS — D3132 Benign neoplasm of left choroid: Secondary | ICD-10-CM | POA: Diagnosis not present

## 2019-10-31 DIAGNOSIS — H02834 Dermatochalasis of left upper eyelid: Secondary | ICD-10-CM | POA: Diagnosis not present

## 2019-10-31 LAB — HM DIABETES EYE EXAM

## 2019-11-01 DIAGNOSIS — M542 Cervicalgia: Secondary | ICD-10-CM | POA: Diagnosis not present

## 2019-11-01 DIAGNOSIS — M79642 Pain in left hand: Secondary | ICD-10-CM | POA: Diagnosis not present

## 2019-11-01 DIAGNOSIS — M150 Primary generalized (osteo)arthritis: Secondary | ICD-10-CM | POA: Diagnosis not present

## 2019-11-01 DIAGNOSIS — M79641 Pain in right hand: Secondary | ICD-10-CM | POA: Diagnosis not present

## 2019-11-06 ENCOUNTER — Other Ambulatory Visit: Payer: Self-pay

## 2019-11-07 ENCOUNTER — Ambulatory Visit (INDEPENDENT_AMBULATORY_CARE_PROVIDER_SITE_OTHER): Payer: Medicare Other | Admitting: General Practice

## 2019-11-07 DIAGNOSIS — Z7901 Long term (current) use of anticoagulants: Secondary | ICD-10-CM

## 2019-11-07 LAB — POCT INR: INR: 2.9 (ref 2.0–3.0)

## 2019-11-07 NOTE — Patient Instructions (Signed)
Pre visit review using our clinic review tool, if applicable. No additional management support is needed unless otherwise documented below in the visit note.  Continue to take 1 tablet (5 mg) everyday except 1 1/2 tablets( 7.5 mg) on Wednesdays and Saturdays.  Re-check in 4 weeks.

## 2019-11-15 ENCOUNTER — Encounter: Payer: Self-pay | Admitting: Podiatry

## 2019-11-15 ENCOUNTER — Other Ambulatory Visit: Payer: Self-pay

## 2019-11-15 ENCOUNTER — Ambulatory Visit: Payer: Medicare Other | Admitting: Podiatry

## 2019-11-15 VITALS — Temp 97.6°F

## 2019-11-15 DIAGNOSIS — M79675 Pain in left toe(s): Secondary | ICD-10-CM | POA: Diagnosis not present

## 2019-11-15 DIAGNOSIS — B351 Tinea unguium: Secondary | ICD-10-CM

## 2019-11-15 DIAGNOSIS — M79674 Pain in right toe(s): Secondary | ICD-10-CM | POA: Diagnosis not present

## 2019-11-15 DIAGNOSIS — Z7901 Long term (current) use of anticoagulants: Secondary | ICD-10-CM

## 2019-11-15 DIAGNOSIS — L84 Corns and callosities: Secondary | ICD-10-CM | POA: Diagnosis not present

## 2019-11-19 DIAGNOSIS — Z01818 Encounter for other preprocedural examination: Secondary | ICD-10-CM | POA: Diagnosis not present

## 2019-11-22 DIAGNOSIS — Z961 Presence of intraocular lens: Secondary | ICD-10-CM | POA: Diagnosis not present

## 2019-11-22 DIAGNOSIS — I509 Heart failure, unspecified: Secondary | ICD-10-CM | POA: Diagnosis not present

## 2019-11-22 DIAGNOSIS — M353 Polymyalgia rheumatica: Secondary | ICD-10-CM | POA: Diagnosis not present

## 2019-11-22 DIAGNOSIS — I4821 Permanent atrial fibrillation: Secondary | ICD-10-CM | POA: Diagnosis not present

## 2019-11-22 DIAGNOSIS — H25811 Combined forms of age-related cataract, right eye: Secondary | ICD-10-CM | POA: Diagnosis not present

## 2019-11-22 DIAGNOSIS — E785 Hyperlipidemia, unspecified: Secondary | ICD-10-CM | POA: Diagnosis not present

## 2019-11-22 DIAGNOSIS — H25812 Combined forms of age-related cataract, left eye: Secondary | ICD-10-CM | POA: Diagnosis not present

## 2019-11-22 DIAGNOSIS — H43812 Vitreous degeneration, left eye: Secondary | ICD-10-CM | POA: Diagnosis not present

## 2019-11-22 DIAGNOSIS — H02834 Dermatochalasis of left upper eyelid: Secondary | ICD-10-CM | POA: Diagnosis not present

## 2019-11-22 DIAGNOSIS — H02831 Dermatochalasis of right upper eyelid: Secondary | ICD-10-CM | POA: Diagnosis not present

## 2019-11-22 DIAGNOSIS — H2181 Floppy iris syndrome: Secondary | ICD-10-CM | POA: Diagnosis not present

## 2019-11-22 DIAGNOSIS — D3132 Benign neoplasm of left choroid: Secondary | ICD-10-CM | POA: Diagnosis not present

## 2019-11-22 DIAGNOSIS — M1990 Unspecified osteoarthritis, unspecified site: Secondary | ICD-10-CM | POA: Diagnosis not present

## 2019-11-22 DIAGNOSIS — I110 Hypertensive heart disease with heart failure: Secondary | ICD-10-CM | POA: Diagnosis not present

## 2019-11-22 DIAGNOSIS — H25813 Combined forms of age-related cataract, bilateral: Secondary | ICD-10-CM | POA: Diagnosis not present

## 2019-11-25 NOTE — Progress Notes (Signed)
Subjective: 79 y.o. returns the office today for painful, elongated, thickened toenails which he cannot trim himself as well as for a callus on the tip of the second toe.  He states that still tender but not as bad as what it was previously.  He is on Coumadin.  PCP: Laurey Morale, MD  Objective: AAO 3, NAD DP/PT pulses palpable, CRT less than 3 seconds Nails hypertrophic, dystrophic, elongated, brittle, discolored 10. There is tenderness overlying the nails 1-5 bilaterally. There is no surrounding erythema or drainage along the nail sites. Hyperkeratotic lesion the distal aspect the left second toe from a hammertoe deformity.  Upon debridement there is no underlying ulceration drainage or any signs of infection. No open lesions or pre-ulcerative lesions are identified. No other areas of tenderness bilateral lower extremities. No overlying edema, erythema, increased warmth. No pain with calf compression, swelling, warmth, erythema.  Assessment: Patient presents with symptomatic onychomycosis, hyperkeratotic lesion due to hammertoe  Plan: -Treatment options including alternatives, risks, complications were discussed -Nails sharply debrided 10 without complication/bleeding. -Hyperkeratotic lesion sharply debrided x1 without any complications or bleeding. Offloading  -Discussed daily foot inspection. If there are any changes, to call the office immediately.  -Follow-up in 3 months or sooner if any problems are to arise. In the meantime, encouraged to call the office with any questions, concerns, changes symptoms.  Celesta Gentile, DPM

## 2019-12-04 ENCOUNTER — Other Ambulatory Visit: Payer: Self-pay

## 2019-12-05 ENCOUNTER — Ambulatory Visit (INDEPENDENT_AMBULATORY_CARE_PROVIDER_SITE_OTHER): Payer: Medicare Other | Admitting: General Practice

## 2019-12-05 DIAGNOSIS — I4891 Unspecified atrial fibrillation: Secondary | ICD-10-CM | POA: Diagnosis not present

## 2019-12-05 DIAGNOSIS — Z7901 Long term (current) use of anticoagulants: Secondary | ICD-10-CM | POA: Diagnosis not present

## 2019-12-05 LAB — POCT INR: INR: 3.3 — AB (ref 2.0–3.0)

## 2019-12-05 NOTE — Patient Instructions (Signed)
Pre visit review using our clinic review tool, if applicable. No additional management support is needed unless otherwise documented below in the visit note.  Skip dosage tomorrow (pt already took today's dose) and then continue to take 1 tablet (5 mg) everyday except 1 1/2 tablets( 7.5 mg) on Wednesdays and Saturdays.  Re-check in 4 weeks.

## 2019-12-07 DIAGNOSIS — I2510 Atherosclerotic heart disease of native coronary artery without angina pectoris: Secondary | ICD-10-CM | POA: Diagnosis not present

## 2019-12-07 DIAGNOSIS — I482 Chronic atrial fibrillation, unspecified: Secondary | ICD-10-CM | POA: Diagnosis not present

## 2019-12-07 DIAGNOSIS — I480 Paroxysmal atrial fibrillation: Secondary | ICD-10-CM | POA: Diagnosis not present

## 2019-12-07 DIAGNOSIS — I442 Atrioventricular block, complete: Secondary | ICD-10-CM | POA: Diagnosis not present

## 2019-12-07 DIAGNOSIS — I10 Essential (primary) hypertension: Secondary | ICD-10-CM | POA: Diagnosis not present

## 2019-12-07 DIAGNOSIS — Z45018 Encounter for adjustment and management of other part of cardiac pacemaker: Secondary | ICD-10-CM | POA: Diagnosis not present

## 2019-12-07 DIAGNOSIS — R001 Bradycardia, unspecified: Secondary | ICD-10-CM | POA: Diagnosis not present

## 2019-12-15 DIAGNOSIS — Z01812 Encounter for preprocedural laboratory examination: Secondary | ICD-10-CM | POA: Diagnosis not present

## 2019-12-15 DIAGNOSIS — H25811 Combined forms of age-related cataract, right eye: Secondary | ICD-10-CM | POA: Diagnosis not present

## 2019-12-18 DIAGNOSIS — I2510 Atherosclerotic heart disease of native coronary artery without angina pectoris: Secondary | ICD-10-CM | POA: Diagnosis not present

## 2019-12-18 DIAGNOSIS — M1990 Unspecified osteoarthritis, unspecified site: Secondary | ICD-10-CM | POA: Diagnosis not present

## 2019-12-18 DIAGNOSIS — I10 Essential (primary) hypertension: Secondary | ICD-10-CM | POA: Diagnosis not present

## 2019-12-18 DIAGNOSIS — I4821 Permanent atrial fibrillation: Secondary | ICD-10-CM | POA: Diagnosis not present

## 2019-12-18 DIAGNOSIS — E785 Hyperlipidemia, unspecified: Secondary | ICD-10-CM | POA: Diagnosis not present

## 2019-12-18 DIAGNOSIS — I110 Hypertensive heart disease with heart failure: Secondary | ICD-10-CM | POA: Diagnosis not present

## 2019-12-18 DIAGNOSIS — H25811 Combined forms of age-related cataract, right eye: Secondary | ICD-10-CM | POA: Diagnosis not present

## 2019-12-18 DIAGNOSIS — I509 Heart failure, unspecified: Secondary | ICD-10-CM | POA: Diagnosis not present

## 2019-12-19 DIAGNOSIS — Z961 Presence of intraocular lens: Secondary | ICD-10-CM | POA: Diagnosis not present

## 2019-12-20 DIAGNOSIS — H25811 Combined forms of age-related cataract, right eye: Secondary | ICD-10-CM | POA: Diagnosis not present

## 2019-12-22 ENCOUNTER — Other Ambulatory Visit: Payer: Self-pay | Admitting: Family Medicine

## 2019-12-28 ENCOUNTER — Other Ambulatory Visit: Payer: Self-pay | Admitting: Family Medicine

## 2020-01-01 ENCOUNTER — Other Ambulatory Visit: Payer: Self-pay

## 2020-01-02 ENCOUNTER — Ambulatory Visit (INDEPENDENT_AMBULATORY_CARE_PROVIDER_SITE_OTHER): Payer: Medicare Other | Admitting: General Practice

## 2020-01-02 DIAGNOSIS — Z7901 Long term (current) use of anticoagulants: Secondary | ICD-10-CM

## 2020-01-02 LAB — POCT INR: INR: 3 (ref 2.0–3.0)

## 2020-01-02 NOTE — Patient Instructions (Signed)
Pre visit review using our clinic review tool, if applicable. No additional management support is needed unless otherwise documented below in the visit note.  Change dosage and take 1 tablet (5 mg) everyday except 1 1/2 tablets( 7.5 mg) on Saturdays only.  Re-check in 4 weeks.

## 2020-01-29 ENCOUNTER — Other Ambulatory Visit: Payer: Self-pay

## 2020-01-30 ENCOUNTER — Ambulatory Visit (INDEPENDENT_AMBULATORY_CARE_PROVIDER_SITE_OTHER): Payer: Medicare Other | Admitting: General Practice

## 2020-01-30 DIAGNOSIS — Z7901 Long term (current) use of anticoagulants: Secondary | ICD-10-CM | POA: Diagnosis not present

## 2020-01-30 LAB — POCT INR: INR: 3.7 — AB (ref 2.0–3.0)

## 2020-01-30 NOTE — Patient Instructions (Addendum)
Pre visit review using our clinic review tool, if applicable. No additional management support is needed unless otherwise documented below in the visit note.  Do not take coumadin tomorrow (5/27) and then change dosage and take 1 tablet (5 mg) everyday.  Re-check in 3 weeks.

## 2020-02-18 ENCOUNTER — Ambulatory Visit: Payer: Medicare Other | Admitting: Podiatry

## 2020-02-18 ENCOUNTER — Other Ambulatory Visit: Payer: Self-pay

## 2020-02-18 DIAGNOSIS — L84 Corns and callosities: Secondary | ICD-10-CM | POA: Diagnosis not present

## 2020-02-18 DIAGNOSIS — M79675 Pain in left toe(s): Secondary | ICD-10-CM

## 2020-02-18 DIAGNOSIS — B351 Tinea unguium: Secondary | ICD-10-CM | POA: Diagnosis not present

## 2020-02-18 DIAGNOSIS — Z7901 Long term (current) use of anticoagulants: Secondary | ICD-10-CM

## 2020-02-18 DIAGNOSIS — M79674 Pain in right toe(s): Secondary | ICD-10-CM | POA: Diagnosis not present

## 2020-02-19 ENCOUNTER — Other Ambulatory Visit: Payer: Self-pay

## 2020-02-19 NOTE — Progress Notes (Signed)
Subjective: 79 y.o. returns the office today for painful, elongated, thickened toenails which he cannot trim himself as well as for a callus on the tip of the second toe.  He states that still tender but not as bad as what it was previously.  He is on Coumadin.  PCP: Laurey Morale, MD  Objective: AAO 3, NAD DP/PT pulses palpable, CRT less than 3 seconds Nails hypertrophic, dystrophic, elongated, brittle, discolored 10. There is tenderness overlying the nails 1-5 bilaterally. There is no surrounding erythema or drainage along the nail sites. Hyperkeratotic lesion the distal aspect the left second toe from a hammertoe deformity.  There is no underlying ulceration identified today. No open lesions or other pre-ulcerative lesions are identified. No other areas of tenderness bilateral lower extremities. No overlying edema, erythema, increased warmth. No pain with calf compression, swelling, warmth, erythema.  Assessment: Patient presents with symptomatic onychomycosis, hyperkeratotic lesion due to hammertoe  Plan: -Treatment options including alternatives, risks, complications were discussed -Nails sharply debrided 10 without complication/bleeding. -Hyperkeratotic lesion sharply debrided x1 without any complications or bleeding. Offloading  -Discussed daily foot inspection. If there are any changes, to call the office immediately.  -Follow-up in 3 months or sooner if any problems are to arise. In the meantime, encouraged to call the office with any questions, concerns, changes symptoms.  Celesta Gentile, DPM

## 2020-02-20 ENCOUNTER — Ambulatory Visit (INDEPENDENT_AMBULATORY_CARE_PROVIDER_SITE_OTHER): Payer: Medicare Other | Admitting: General Practice

## 2020-02-20 DIAGNOSIS — I4891 Unspecified atrial fibrillation: Secondary | ICD-10-CM | POA: Diagnosis not present

## 2020-02-20 LAB — POCT INR: INR: 3.2 — AB (ref 2.0–3.0)

## 2020-02-20 NOTE — Patient Instructions (Addendum)
Pre visit review using our clinic review tool, if applicable. No additional management support is needed unless otherwise documented below in the visit note.  Do not take coumadin tomorrow (6/17) and then change dosage and take 1 tablet (5 mg) everyday except 1/2 tablet on Wednesday.  Re-check in 4 weeks.

## 2020-02-27 ENCOUNTER — Telehealth: Payer: Self-pay | Admitting: Family Medicine

## 2020-02-27 NOTE — Progress Notes (Signed)
  Chronic Care Management   Outreach Note  02/27/2020 Name: ESHAN TRUPIANO MRN: [id number] DOB: [birth year]  Referred by: Laurey Morale, MD Reason for referral : No chief complaint on file.   An unsuccessful telephone outreach was attempted today. The patient was referred to the pharmacist for assistance with care management and care coordination.   Follow Up Plan:  [facility]

## 2020-02-27 NOTE — Progress Notes (Signed)
  Chronic Care Management   Note  02/27/2020 Name: Tanner Hensley MRN: [id number] DOB: 1941/05/12  Tanner Hensley is a 79 y.o. year old male who is a primary care patient of Laurey Morale, MD. I reached out to [name] by phone today in response to a referral sent by Tanner Hensley's PCP, Laurey Morale, MD.   Tanner Hensley was given information about Chronic Care Management services today including:  1. CCM service includes personalized support from designated clinical staff supervised by his physician, including individualized plan of care and coordination with other care providers 2. 24/7 contact phone numbers for assistance for urgent and routine care needs. 3. Service will only be billed when office clinical staff spend 20 minutes or more in a month to coordinate care. 4. Only one practitioner may furnish and bill the service in a calendar month. 5. The patient may stop CCM services at any time (effective at the end of the month) by phone call to the office staff.   Patient agreed to services and verbal consent obtained.   Follow up plan:   [place]

## 2020-03-06 ENCOUNTER — Other Ambulatory Visit: Payer: Self-pay | Admitting: Family Medicine

## 2020-03-17 DIAGNOSIS — I442 Atrioventricular block, complete: Secondary | ICD-10-CM | POA: Diagnosis not present

## 2020-03-17 DIAGNOSIS — I495 Sick sinus syndrome: Secondary | ICD-10-CM | POA: Diagnosis not present

## 2020-03-17 DIAGNOSIS — I482 Chronic atrial fibrillation, unspecified: Secondary | ICD-10-CM | POA: Diagnosis not present

## 2020-03-17 DIAGNOSIS — I480 Paroxysmal atrial fibrillation: Secondary | ICD-10-CM | POA: Diagnosis not present

## 2020-03-19 ENCOUNTER — Ambulatory Visit (INDEPENDENT_AMBULATORY_CARE_PROVIDER_SITE_OTHER): Payer: Medicare Other | Admitting: General Practice

## 2020-03-19 ENCOUNTER — Other Ambulatory Visit: Payer: Self-pay

## 2020-03-19 DIAGNOSIS — I4891 Unspecified atrial fibrillation: Secondary | ICD-10-CM

## 2020-03-19 LAB — POCT INR: INR: 2.4 (ref 2.0–3.0)

## 2020-03-19 NOTE — Patient Instructions (Signed)
Pre visit review using our clinic review tool, if applicable. No additional management support is needed unless otherwise documented below in the visit note. 

## 2020-03-20 DIAGNOSIS — Z45018 Encounter for adjustment and management of other part of cardiac pacemaker: Secondary | ICD-10-CM | POA: Diagnosis not present

## 2020-03-25 DIAGNOSIS — E860 Dehydration: Secondary | ICD-10-CM | POA: Diagnosis not present

## 2020-03-25 DIAGNOSIS — I4891 Unspecified atrial fibrillation: Secondary | ICD-10-CM | POA: Diagnosis not present

## 2020-03-25 DIAGNOSIS — R55 Syncope and collapse: Secondary | ICD-10-CM | POA: Diagnosis not present

## 2020-03-25 DIAGNOSIS — T679XXA Effect of heat and light, unspecified, initial encounter: Secondary | ICD-10-CM | POA: Diagnosis not present

## 2020-03-25 DIAGNOSIS — R231 Pallor: Secondary | ICD-10-CM | POA: Diagnosis not present

## 2020-04-16 ENCOUNTER — Other Ambulatory Visit: Payer: Self-pay

## 2020-04-16 ENCOUNTER — Ambulatory Visit (INDEPENDENT_AMBULATORY_CARE_PROVIDER_SITE_OTHER): Payer: Medicare Other | Admitting: General Practice

## 2020-04-16 DIAGNOSIS — I4891 Unspecified atrial fibrillation: Secondary | ICD-10-CM | POA: Diagnosis not present

## 2020-04-16 LAB — POCT INR: INR: 2.6 (ref 2.0–3.0)

## 2020-04-16 NOTE — Patient Instructions (Signed)
Pre visit review using our clinic review tool, if applicable. No additional management support is needed unless otherwise documented below in the visit note.  Continue to take 1 tablet (5 mg) everyday except 1/2 tablet on Wednesday.  Re-check in 4 weeks.

## 2020-04-29 ENCOUNTER — Telehealth: Payer: Self-pay

## 2020-04-29 ENCOUNTER — Telehealth: Payer: Self-pay | Admitting: Family Medicine

## 2020-04-29 DIAGNOSIS — I10 Essential (primary) hypertension: Secondary | ICD-10-CM

## 2020-04-29 DIAGNOSIS — I4811 Longstanding persistent atrial fibrillation: Secondary | ICD-10-CM

## 2020-04-29 NOTE — Telephone Encounter (Signed)
-----   Message from Germaine Pomfret, [facility] sent at 04/25/2020  1:24 PM EDT ----- Regarding: CCM Referral Dr. Sarajane Jews,  Can you please place a referral to chronic care management for this patient?  Thanks, Doristine Section Clinical Pharmacist [facility] [place]

## 2020-04-29 NOTE — Progress Notes (Signed)
Left Voice message to do initial question prior to patient appointment on 04/30/2020 for CCM at 2:00pm with Junius Argyle the Clinical pharmacist.    [place] Pharmacist Assistant [phone]

## 2020-04-30 ENCOUNTER — Other Ambulatory Visit: Payer: Self-pay

## 2020-04-30 ENCOUNTER — Ambulatory Visit: Payer: Medicare Other

## 2020-04-30 DIAGNOSIS — I10 Essential (primary) hypertension: Secondary | ICD-10-CM

## 2020-04-30 DIAGNOSIS — E785 Hyperlipidemia, unspecified: Secondary | ICD-10-CM

## 2020-04-30 NOTE — Patient Instructions (Addendum)
Visit Information It was great speaking with you today!  Please let me know if you have any questions about our visit.  Goals Addressed            This Visit's Progress    Chronic Care Management       CARE PLAN ENTRY (see longitudinal plan of care for additional care plan information)  Current Barriers:   Chronic Disease Management support, education, and care coordination needs related to Hypertension, Hyperlipidemia, Diabetes, Atrial Fibrillation, Coronary Artery Disease, GERD, Osteoporosis, Osteoarthritis, Overactive Bladder and BPH    Hypertension BP Readings from Last 3 Encounters:  06/21/19 120/60  02/13/19 140/72  10/11/18 (!) 88/52    Pharmacist Clinical Goal(s): o Over the next 90 days, patient will work with PharmD and providers to maintain BP goal <140/90  Current regimen:   Atenolol 25 mg daily   Olmesartan 20 mg daily   Interventions: o Discussed low salt diet and exercising as tolerated extensively  Patient self care activities - Over the next 90 days, patient will: o Check Blood Pressure 1-2 time weekly, document, and provide at future appointments o Ensure daily salt intake < 2300 mg/day  Hyperlipidemia Lab Results  Component Value Date/Time   LDLCALC 121 (H) 06/21/2019 11:57 AM   LDLDIRECT 176.1 07/20/2012 10:50 AM    Pharmacist Clinical Goal(s): o Over the next 90 days, patient will work with PharmD and providers to achieve LDL goal < 100  Interventions: o Discussed low cholesterol diet and exercising as tolerated extensively  Medication management  Pharmacist Clinical Goal(s): o Over the next 90 days, patient will work with PharmD and providers to maintain optimal medication adherence  Current pharmacy: [facility]  Interventions o Comprehensive medication review performed. o Continue current medication management strategy  Patient self care activities - Over the next 90 days, patient will: o Take medications as  prescribed o Report any questions or concerns to PharmD and/or provider(s)       Mr. Stueve was given information about Chronic Care Management services today including:  1. CCM service includes personalized support from designated clinical staff supervised by his physician, including individualized plan of care and coordination with other care providers 2. 24/7 contact phone numbers for assistance for urgent and routine care needs. 3. Standard insurance, coinsurance, copays and deductibles apply for chronic care management only during months in which we provide at least 20 minutes of these services. Most insurances cover these services at 100%, however patients may be responsible for any copay, coinsurance and/or deductible if applicable. This service may help you avoid the need for more expensive face-to-face services. 4. Only one practitioner may furnish and bill the service in a calendar month. 5. The patient may stop CCM services at any time (effective at the end of the month) by phone call to the office staff.  Patient agreed to services and verbal consent obtained.   The patient verbalized understanding of instructions provided today and agreed to receive a mailed copy of patient instruction and/or educational materials. Face to Face appointment with pharmacist scheduled for:  11/27/19 at 1:00 PM  [facility] [place]  Preventing High Cholesterol Cholesterol is a white, waxy substance similar to fat that the human body needs to help build cells. The liver makes all the cholesterol that a person's body needs. Having high cholesterol (hypercholesterolemia) increases a person's risk for heart disease and stroke. Extra (excess) cholesterol comes from the food the person  eats. High cholesterol can often be prevented with diet and lifestyle changes. If you already have high cholesterol, you can control it with diet and  lifestyle changes and with medicine. How can high cholesterol affect me? If you have high cholesterol, deposits (plaques) may build up on the walls of your arteries. The arteries are the blood vessels that carry blood away from your heart. Plaques make the arteries narrower and stiffer. This can limit or block blood flow and cause blood clots to form. Blood clots:  Are tiny balls of cells that form in your blood.  Can move to the heart or brain, causing a heart attack or stroke. Plaques in arteries greatly increase your risk for heart attack and stroke.Making diet and lifestyle changes can reduce your risk for these conditions that may threaten your life. What can increase my risk? This condition is more likely to develop in people who:  Eat foods that are high in saturated fat or cholesterol. Saturated fat is mostly found in: ? Foods that contain animal fat, such as red meat and some dairy products. ? Certain fatty foods made from plants, such as tropical oils.  Are overweight.  Are not getting enough exercise.  Have a family history of high cholesterol. What actions can I take to prevent this? Nutrition   Eat less saturated fat.  Avoid trans fats (partially hydrogenated oils). These are often found in margarine and in some baked goods, fried foods, and snacks bought in packages.  Avoid precooked or cured meat, such as sausages or meat loaves.  Avoid foods and drinks that have added sugars.  Eat more fruits, vegetables, and whole grains.  Choose healthy sources of protein, such as fish, poultry, lean cuts of red meat, beans, peas, lentils, and nuts.  Choose healthy sources of fat, such as: ? Nuts. ? Vegetable oils, especially olive oil. ? Fish that have healthy fats (omega-3 fatty acids), such as mackerel or salmon. The items listed above may not be a complete list of recommended foods and beverages. Contact a dietitian for more information. Lifestyle  Lose weight if you  are overweight. Losing 5-10 lb (2.3-4.5 kg) can help prevent or control high cholesterol. It can also lower your risk for diabetes and high blood pressure. Ask your health care provider to help you with a diet and exercise plan to lose weight safely.  Do not use any products that contain nicotine or tobacco, such as cigarettes, e-cigarettes, and chewing tobacco. If you need help quitting, ask your health care provider.  Limit your alcohol intake. ? Do not drink alcohol if:  Your health care provider tells you not to drink.  You are pregnant, may be pregnant, or are planning to become pregnant. ? If you drink alcohol:  Limit how much you use to:  0-1 drink a day for women.  0-2 drinks a day for men.  Be aware of how much alcohol is in your drink. In the [place] one drink equals one 12 oz bottle of beer (355 mL), one 5 oz glass of wine (148 mL), or one 1 oz glass of hard liquor (44 mL). Activity   Get enough exercise. Each week, do at least 150 minutes of exercise that takes a medium level of effort (moderate-intensity exercise). ? This is exercise that:  Makes your heart beat faster and makes you breathe harder than usual.  Allows you to still be able to talk. ? You could exercise in short sessions several times a day or  longer sessions a few times a week. For example, on 5 days each week, you could walk fast or ride your bike 3 times a day for 10 minutes each time.  Do exercises as told by your health care provider. Medicines  In addition to diet and lifestyle changes, your health care provider may recommend medicines to help lower cholesterol. This may be a medicine to lower the amount of cholesterol your liver makes. You may need medicine if: ? Diet and lifestyle changes do not lower your cholesterol enough. ? You have high cholesterol and other risk factors for heart disease or stroke.  Take over-the-counter and prescription medicines only as told by your health care  provider. General information  Manage your risk factors for high cholesterol. Talk with your health care provider about all your risk factors and how to lower your risk.  Manage other conditions that you have, such as diabetes or high blood pressure (hypertension).  Have blood tests to check your cholesterol levels at regular points in time as told by your health care provider.  Keep all follow-up visits as told by your health care provider. This is important. Where to find more information  American Heart Association: www.heart.org  National Heart, Lung, and Blood Institute: https://wilson-eaton.com/ Summary  High cholesterol increases your risk for heart disease and stroke. By keeping your cholesterol level low, you can reduce your risk for these conditions.  High cholesterol can often be prevented with diet and lifestyle changes.  Work with your health care provider to manage your risk factors, and have your blood tested regularly. This information is not intended to replace advice given to you by your health care provider. Make sure you discuss any questions you have with your health care provider. Document Revised: 12/15/2018 Document Reviewed: 05/01/2016 Elsevier Patient Education  2020 [employer].

## 2020-04-30 NOTE — Chronic Care Management (AMB) (Signed)
Chronic Care Management Pharmacy  Name: Tanner Hensley  MRN: [id number] DOB: 11/16/1940   Chief Complaint/ HPI  Tanner Hensley,  79 y.o. , male presents for their Initial CCM visit with the clinical pharmacist In office. Patient presents today with his wife Tanner Hensley who helps him keep up with his medications and health.   PCP : Laurey Morale, MD Patient Care Team: Laurey Morale, MD as PCP - General (Family Medicine) Ebbie Ridge, MD as Referring Physician (Cardiology) Germaine Pomfret, [facility] as Pharmacist (Pharmacist)  Their chronic conditions include: Hypertension, Hyperlipidemia, Diabetes, Atrial Fibrillation, Coronary Artery Disease, GERD, Osteoporosis, Osteoarthritis, Overactive Bladder and BPH   Office Visits: 06/21/19: Patient presented to Dr. Sarajane Jews for follow-up.  Consult Visit: 03/17/20: Patient presented to Dr. Ola Spurr (Cardiology) for A-Fib Follow-up. No medication changes made.   Allergies  Allergen Reactions  . Codeine Rash and Other (See Comments)    Dizziness, low blood pressure and heart rate  . Hydrocodone-Acetaminophen Rash and Other (See Comments)    Dizziness, low blood pressure and heart rate  . Tramadol Nausea And Vomiting  . Lipitor [Atorvastatin] Other (See Comments)    Myalgias   . Robaxin [Methocarbamol] Other (See Comments)    Dropped blood pressure too much  . Zocor [Simvastatin] Other (See Comments)    myalgias    Medications: Outpatient Encounter Medications as of 04/30/2020  Medication Sig Note  . acetaminophen (TYLENOL) 500 MG tablet Take 1,000 mg by mouth at bedtime as needed.  06/05/2018: 06/05/18 taking 650 mg daily  . alendronate (FOSAMAX) 70 MG tablet TAKE 1 TABLET BY MOUTH  WEEKLY WITH 8 OUNCE OF  PLAIN WATER 1/2 HOUR BEFORE FIRST FOOD DRINK OR MEDS.  STAY UPRIGHT FOR 1/2 HOUR   . ascorbic acid (VITAMIN C) 500 MG tablet    . aspirin EC 81 MG tablet Take 81 mg by mouth daily.   Marland Kitchen atenolol (TENORMIN) 25 MG tablet Take 25 mg  by mouth daily.   Marland Kitchen b complex vitamins tablet Take 1 tablet by mouth daily.   . Biotin 1000 MCG tablet Take 5,000 mcg by mouth daily.    . calcium citrate-vitamin D (CITRACAL+D) 315-200 MG-UNIT tablet Take 1 tablet by mouth 2 (two) times daily. 06/05/2018: 06/05/18 ca,cium 630 mg/ Vit D 500 IU  . donepezil (ARICEPT) 10 MG tablet TAKE 1 TABLET BY MOUTH AT  BEDTIME   . memantine (NAMENDA) 10 MG tablet TAKE 1 TABLET BY MOUTH  TWICE A DAY   . Multiple Vitamin (MULTIVITAMIN WITH MINERALS) TABS tablet Take 1 tablet by mouth daily. SENIOR MULTIVITAMIN   . olmesartan (BENICAR) 20 MG tablet TAKE 1 TABLET BY MOUTH  DAILY   . warfarin (COUMADIN) 5 MG tablet Take 1 tablet daily except take 1 1/2 tablets on Wed and Sat. Or take as directed by anticoagulation clinic   . solifenacin (VESICARE) 5 MG tablet Take 1 tablet (5 mg total) by mouth daily.    No facility-administered encounter medications on file as of 04/30/2020.     Current Diagnosis/Assessment:  SDOH Interventions     Most Recent Value  SDOH Interventions  Financial Strain Interventions Intervention Not Indicated  Transportation Interventions Intervention Not Indicated      Goals Addressed            This Visit's Progress   . Chronic Care Management       CARE PLAN ENTRY (see longitudinal plan of care for additional care plan information)  Current Barriers:  .  Chronic Disease Management support, education, and care coordination needs related to Hypertension, Hyperlipidemia, Diabetes, Atrial Fibrillation, Coronary Artery Disease, GERD, Osteoporosis, Osteoarthritis, Overactive Bladder and BPH    Hypertension BP Readings from Last 3 Encounters:  06/21/19 120/60  02/13/19 140/72  10/11/18 (!) 88/52   . Pharmacist Clinical Goal(s): o Over the next 90 days, patient will work with PharmD and providers to maintain BP goal <140/90 . Current regimen:   Atenolol 25 mg daily   Olmesartan 20 mg daily  . Interventions: o Discussed low  salt diet and exercising as tolerated extensively . Patient self care activities - Over the next 90 days, patient will: o Check Blood Pressure 1-2 time weekly, document, and provide at future appointments o Ensure daily salt intake < 2300 mg/day  Hyperlipidemia Lab Results  Component Value Date/Time   LDLCALC 121 (H) 06/21/2019 11:57 AM   LDLDIRECT 176.1 07/20/2012 10:50 AM   . Pharmacist Clinical Goal(s): o Over the next 90 days, patient will work with PharmD and providers to achieve LDL goal < 100 . Interventions: o Discussed low cholesterol diet and exercising as tolerated extensively  Medication management . Pharmacist Clinical Goal(s): o Over the next 90 days, patient will work with PharmD and providers to maintain optimal medication adherence . Current pharmacy: [facility] . Interventions o Comprehensive medication review performed. o Continue current medication management strategy . Patient self care activities - Over the next 90 days, patient will: o Take medications as prescribed o Report any questions or concerns to PharmD and/or provider(s)       AFIB   s/p Dual-Chamber Pacemaker  Patient is currently rhythm controlled.  Patient has failed these meds in past: n/a Patient is currently controlled on the  following medications:  . Atenolol 25 mg daily  . Warfarin 5 mg 1.5 tablets Wed/Sat, 1 tablet all other days   INR  Date Value Ref Range Status  04/16/2020 2.6 2.0 - 3.0 Final  03/19/2020 2.4 2.0 - 3.0 Final  02/20/2020 3.2 (A) 2.0 - 3.0 Final   We discussed:  diet and exercise extensively. No unusual bruising/bleeding with warfarin.   Plan  Continue current medications  Hypertension   BP goal is:  <140/90  Office blood pressures are  BP Readings from Last 3 Encounters:  06/21/19 120/60  02/13/19 140/72  10/11/18 (!) 88/52   CMP Latest Ref Rng & Units 06/21/2019 04/06/2018 01/06/2017  Glucose 70 - 99 mg/dL 80 91 92  BUN 6 - 23  mg/dL '15 19 12  ' Creatinine 0.40 - 1.50 mg/dL 1.00 1.21 0.98  Sodium 135 - 145 mEq/L 142 141 141  Potassium 3.5 - 5.1 mEq/L 3.9 4.5 3.9  Chloride 96 - 112 mEq/L 106 105 104  CO2 19 - 32 mEq/L 27 29 32  Calcium 8.4 - 10.5 mg/dL 9.7 9.9 9.7  Total Protein 6.0 - 8.3 g/dL - 6.4 6.5  Total Bilirubin 0.2 - 1.2 mg/dL - 1.0 0.7  Alkaline Phos 39 - 117 U/L - 80 87  AST 0 - 37 U/L - 22 31  ALT 0 - 53 U/L - 17 19   Patient checks BP at home several times per month and when symptomatic Patient home BP readings are ranging: 120-130 over 70s   Patient has failed these meds in the past: n/a Patient is currently controlled on the following medications:  . Atenolol 25 mg daily  . Olmesartan 20 mg daily   We discussed diet and exercise extensively  Plan  Continue current medications   Prediabetes   A1c goal <6.5%  Recent Relevant Labs: Lab Results  Component Value Date/Time   HGBA1C 5.7 06/21/2019 11:57 AM   HGBA1C 6.1 04/06/2018 09:50 AM   GFR 72.11 06/21/2019 11:57 AM   GFR 61.70 04/06/2018 09:50 AM   MICROALBUR 1.3 08/09/2014 10:27 AM   MICROALBUR 2.4 (H) 07/13/2010 10:58 AM    Last diabetic Eye exam: No results found for: HMDIABEYEEXA  Last diabetic Foot exam: No results found for: HMDIABFOOTEX   Patient has failed these meds in past: n/a Patient is currently controlled on the following medications: . None  We discussed: diet and exercise extensively  Plan  Continue control with diet and exercise  Hyperlipidemia   S/p stenting 2011   LDL goal < 70  Lipid Panel     Component Value Date/Time   CHOL 194 06/21/2019 1157   TRIG 104.0 06/21/2019 1157   HDL 52.10 06/21/2019 1157   LDLCALC 121 (H) 06/21/2019 1157   LDLDIRECT 176.1 07/20/2012 1050    Hepatic Function Latest Ref Rng & Units 04/06/2018 01/06/2017 10/15/2016  Total Protein 6.0 - 8.3 g/dL 6.4 6.5 5.1(L)  Albumin 3.5 - 5.2 g/dL 4.2 4.2 3.1(L)  AST 0 - 37 U/L 22 31 34  ALT 0 - 53 U/L 17 19 13(L)  Alk Phosphatase  39 - 117 U/L 80 87 45  Total Bilirubin 0.2 - 1.2 mg/dL 1.0 0.7 0.7  Bilirubin, Direct 0.0 - 0.3 mg/dL 0.2 0.1 -     The 10-year ASCVD risk score Mikey Bussing DC Jr., et al., 2013) is: 70.1%   Values used to calculate the score:     Age: 54 years     Sex: Male     Is Non-Hispanic African American: No     Diabetic: Yes     Tobacco smoker: No     Systolic Blood Pressure: 478 mmHg     Is BP treated: Yes     HDL Cholesterol: 52.1 mg/dL     Total Cholesterol: 194 mg/dL   Patient has failed these meds in past: Simvastatin (Myalgias), Atorvastatin (myalgias)   Patient is currently uncontrolled on the following medications: None   We discussed: Eating red meat 1x weekly, rarely eats out at restaurants. Patient unable to tolerate statins due to PMR.   Plan  Continue current medications  Memory    Previously managed by Dr. Bonnita Levan   Patient has failed these meds in past: n/a Patient is currently controlled on the following medications:  . Donepezile 10 mg QHS  . Memantine 10 mg BID   We discussed:  Patient's wife reports she has noticed a more steady decline in his short-term memory. Patient still tolerating medications and likely slowing progression of memory loss so would recommend continuing.   Plan  Continue current medications  BPH / Overactive Bladder    PSA  Date Value Ref Range Status  06/21/2019 1.32 0.10 - 4.00 ng/mL Final    Comment:    Test performed using Access Hybritech PSA Assay, a parmagnetic partical, chemiluminecent immunoassay.  04/06/2018 1.42 0.10 - 4.00 ng/mL Final    Comment:    Test performed using Access Hybritech PSA Assay, a parmagnetic partical, chemiluminecent immunoassay.  01/06/2017 1.34 0.10 - 4.00 ng/mL Final     Patient has failed these meds in past: Oxybutynin (cost), solifenacin (cost)  Patient is currently uncontrolled on the following medications: None  We discussed:  Patient never started Solifenacin due to cost. Was previously recommended  Oxybutynin but seems like he never started that medicine either due to cost in 2014. Currently, Oxybutynin is a T2 generic under his insurance plan.   Patient continues to have significant problems with overactive bladder and constantly worries when he is out of the house of having to go to the bathroom.   Plan  Recommend starting Oxybutynin ER 5 mg daily.   Osteoporosis (Not in Chart)    Last DEXA Scan: 10/30/15   T-Score femoral neck: -3.0  T-Score total hip: -3.0  T-Score lumbar spine: +0.0  T-Score forearm radius: n/a  10-year probability of major osteoporotic fracture: n/a  10-year probability of hip fracture: n/a  No results found for: VD25OH   Patient is a candidate for pharmacologic treatment due to T-Score < -2.5 in femoral neck and T-Score < -2.5 in total hip   Patient has failed these meds in past: n/a Patient is currently controlled on the following medications:  . Alendronate 70 mg weekly (Started 06/07/11)  . Calcium Citrate + Vit D 315-200 mg twice daily We discussed:  Recommend [phone] units of vitamin D daily. Recommend 1200 mg of calcium daily from dietary and supplemental sources. Counseled on oral bisphosphonate administration: take in the morning, 30 minutes prior to food with 6-8 oz of water. Do not lie down for at least 30 minutes after taking.   Could consider bisphosphonate holiday after 10 years given most recent T-score of -3.0.   Plan  Continue current medications   Misc / OTC    . APAP 500 mg 2 tablets QHS  . Vitamin C 500 mg  . Aspirin 81 mg daily  . Vitamin B Complex daily  . Biotin 5000 mcg daily  . Multivitamin (Senior Multivitamin) Daily   Plan  Continue current medications   Vaccines   Reviewed and discussed patient's vaccination history.    Immunization History  Administered Date(s) Administered  . Fluad Quad(high Dose 65+) 06/15/2019  . Influenza Split 06/07/2011, 06/21/2012  . Influenza Whole 07/07/2005, 06/09/2007,  06/07/2008, 06/18/2009, 07/13/2010  . Influenza, High Dose Seasonal PF 06/15/2016, 06/23/2017, 07/24/2018  . Influenza,inj,Quad PF,6+ Mos 07/26/2013, 05/20/2014, 04/28/2015  . Influenza-Unspecified 06/07/2011, 06/21/2012, 07/26/2013, 05/20/2014, 04/28/2015, 06/15/2016, 06/23/2017, 07/24/2018  . Pneumococcal Conjugate-13 08/09/2014  . Pneumococcal Polysaccharide-23 07/20/2012  . Pneumococcal-Unspecified 07/20/2012, 08/09/2014  . Td 07/13/2010   Plan  Recommended patient receive Covid vaccine.  Medication Management   Pt uses [facility] pharmacy for all medications Uses pill box? Yes, which his wife fills for him.   Plan  Continue current medication management strategy  Follow up: 7 month phone visit  [facility] [place]

## 2020-05-02 ENCOUNTER — Telehealth: Payer: Self-pay | Admitting: Family Medicine

## 2020-05-02 ENCOUNTER — Telehealth: Payer: Self-pay

## 2020-05-02 MED ORDER — OXYBUTYNIN CHLORIDE ER 5 MG PO TB24: 5.0000 mg | ORAL_TABLET | Freq: Every day | ORAL | 11 refills | Status: DC

## 2020-05-02 NOTE — Progress Notes (Addendum)
Spoke to patient's wife, Pryor Montes (List[place]) to inform her of the following recommendations per Junius Argyle Clinical Pharmacist:   Junius Argyle had a discussion with Dr. Sarajane Jews, they want to start him on Oxybutynin ER 5 mg daily to help with his frequent urination. He can take this at any time of the day with or without meals. It is a Tier 2 medication on his formulary, so it should only cost him around $8 to fill it at his pharmacy. [facility] per [place] Pharmacist .   Patient's  wife stated they changed their pharmacy [facility] on [address] and she is going to pick it up the Oxybutynin this afternoon.  Patient verbalized her understanding of the plan.   [place] Pharmacist Assistant [phone]    Addendum 05/05/20:   Updated patient's preferred local pharmacy. Will plan for CPA assessment in 2 weeks to assess tolerability and symptoms.   Doristine Section Clinical Pharmacist [facility] [place]

## 2020-05-02 NOTE — Telephone Encounter (Signed)
-----   Message from Laurey Morale, MD sent at 05/02/2020 12:19 PM EDT ----- Regarding: RE: CCM Pharmacist Recommendations I think the Oxybutynin ER 5 mg daily would work just fine. We can send this in  ----- Message ----- From: Germaine Pomfret, [facility] Sent: 04/30/2020   3:47 PM EDT To: Laurey Morale, MD Subject: CCM Pharmacist Recommendations                 Dr. Sarajane Jews,  Please see my note for full details but in summary:   - Patient never picked up solifenacin due to cost. Looking back he previously was prescribed oxybutynin but never picked that medicine up due to cost. Under his current insurance Oxybutynin is a T2 medication. What would you think about having him start Oxybutynin ER 5 mg daily for overactive bladder?   - At your next office visit with Mr. Vinje in October, I would recommend ordering another DEXA scan. He is approaching the 9-year mark for alendronate and we could consider a holiday after 10 years depending on the results of his next DEXA scan.   Please let me know your thoughts regarding the Oxybutynin and I will relay them to the patient.  Thanks, Doristine Section Clinical Pharmacist [facility] [place]

## 2020-05-02 NOTE — Telephone Encounter (Signed)
The Vesicare was too expensive so he will try Oxybutynin ER instead

## 2020-05-14 ENCOUNTER — Ambulatory Visit (INDEPENDENT_AMBULATORY_CARE_PROVIDER_SITE_OTHER): Payer: Medicare Other | Admitting: General Practice

## 2020-05-14 ENCOUNTER — Other Ambulatory Visit: Payer: Self-pay

## 2020-05-14 DIAGNOSIS — I4891 Unspecified atrial fibrillation: Secondary | ICD-10-CM

## 2020-05-14 LAB — POCT INR: INR: 2.4 (ref 2.0–3.0)

## 2020-05-14 NOTE — Patient Instructions (Addendum)
Pre visit review using our clinic review tool, if applicable. No additional management support is needed unless otherwise documented below in the visit note.  Continue to take 1 tablet (5 mg) everyday except 1/2 tablet on Wednesday.  Re-check in 6 weeks.

## 2020-05-23 ENCOUNTER — Ambulatory Visit: Payer: Medicare Other | Admitting: Podiatry

## 2020-06-09 ENCOUNTER — Other Ambulatory Visit: Payer: Self-pay | Admitting: Family Medicine

## 2020-06-24 ENCOUNTER — Encounter: Payer: Self-pay | Admitting: Family Medicine

## 2020-06-24 ENCOUNTER — Other Ambulatory Visit: Payer: Self-pay

## 2020-06-24 ENCOUNTER — Ambulatory Visit (INDEPENDENT_AMBULATORY_CARE_PROVIDER_SITE_OTHER): Payer: Medicare Other | Admitting: Family Medicine

## 2020-06-24 VITALS — BP 140/76 | HR 73 | Temp 97.9°F | Ht 65.0 in | Wt 161.6 lb

## 2020-06-24 DIAGNOSIS — E1169 Type 2 diabetes mellitus with other specified complication: Secondary | ICD-10-CM

## 2020-06-24 DIAGNOSIS — I2510 Atherosclerotic heart disease of native coronary artery without angina pectoris: Secondary | ICD-10-CM | POA: Diagnosis not present

## 2020-06-24 DIAGNOSIS — Z23 Encounter for immunization: Secondary | ICD-10-CM | POA: Diagnosis not present

## 2020-06-24 DIAGNOSIS — K219 Gastro-esophageal reflux disease without esophagitis: Secondary | ICD-10-CM

## 2020-06-24 DIAGNOSIS — N318 Other neuromuscular dysfunction of bladder: Secondary | ICD-10-CM

## 2020-06-24 DIAGNOSIS — I10 Essential (primary) hypertension: Secondary | ICD-10-CM

## 2020-06-24 DIAGNOSIS — N138 Other obstructive and reflux uropathy: Secondary | ICD-10-CM

## 2020-06-24 DIAGNOSIS — I4811 Longstanding persistent atrial fibrillation: Secondary | ICD-10-CM

## 2020-06-24 DIAGNOSIS — N401 Enlarged prostate with lower urinary tract symptoms: Secondary | ICD-10-CM | POA: Diagnosis not present

## 2020-06-24 DIAGNOSIS — M353 Polymyalgia rheumatica: Secondary | ICD-10-CM

## 2020-06-24 DIAGNOSIS — E785 Hyperlipidemia, unspecified: Secondary | ICD-10-CM

## 2020-06-24 DIAGNOSIS — R413 Other amnesia: Secondary | ICD-10-CM

## 2020-06-24 NOTE — Progress Notes (Signed)
Subjective:    Patient ID: Tanner Hensley, male    DOB: November 29, 1940, 79 y.o.   MRN: [id number]  HPI Here with his wife to follow up on issues. His BP at home has been stable. He denies any chest pain or SOB or palpitations. His PMR and arthritis are fairly well controlled. His memory loss seems to have stabilized a bit on Namenda and Aricept, but he admits to being frustrated a lot about inability to remember things and especially about no longer being allowed to drive. His wife says he gets irritable and short with her when he gets frustrated. She wants them them to see a therapist together to learn ways of dealing with this, but so far Waunita Schooner has been resistant to this ides.    Review of Systems  Constitutional: Negative.   HENT: Negative.   Eyes: Negative.   Respiratory: Negative.   Cardiovascular: Negative.   Gastrointestinal: Negative.   Genitourinary: Negative.   Musculoskeletal: Positive for arthralgias.  Skin: Negative.   Neurological: Negative.   Psychiatric/Behavioral: Positive for confusion and decreased concentration. Negative for agitation, behavioral problems, dysphoric mood and sleep disturbance. The patient is not nervous/anxious.        Objective:   Physical Exam Constitutional:      General: He is not in acute distress.    Appearance: He is well-developed. He is not diaphoretic.  HENT:     Head: Normocephalic and atraumatic.     Right Ear: External ear normal.     Left Ear: External ear normal.     Nose: Nose normal.     Mouth/Throat:     Pharynx: No oropharyngeal exudate.  Eyes:     General: No scleral icterus.       Right eye: No discharge.        Left eye: No discharge.     Conjunctiva/sclera: Conjunctivae normal.     Pupils: Pupils are equal, round, and reactive to light.  Neck:     Thyroid: No thyromegaly.     Vascular: No JVD.     Trachea: No tracheal deviation.  Cardiovascular:     Rate and Rhythm: Normal rate and regular rhythm.     Heart  sounds: Normal heart sounds. No murmur heard.  No friction rub. No gallop.   Pulmonary:     Effort: Pulmonary effort is normal. No respiratory distress.     Breath sounds: Normal breath sounds. No wheezing or rales.  Chest:     Chest wall: No tenderness.  Abdominal:     General: Bowel sounds are normal. There is no distension.     Palpations: Abdomen is soft. There is no mass.     Tenderness: There is no abdominal tenderness. There is no guarding or rebound.     Comments: Small non-tender reducible right inguinal hernia   Genitourinary:    Penis: Normal. No tenderness.      Testes: Normal.     Prostate: Normal.     Rectum: Normal. Guaiac result negative.  Musculoskeletal:        General: No tenderness. Normal range of motion.     Cervical back: Neck supple.  Lymphadenopathy:     Cervical: No cervical adenopathy.  Skin:    General: Skin is warm and dry.     Coloration: Skin is not pale.     Findings: No erythema or rash.  Neurological:     Mental Status: He is alert and oriented to person, place, and time.  Cranial Nerves: No cranial nerve deficit.     Motor: No abnormal muscle tone.     Coordination: Coordination normal.     Deep Tendon Reflexes: Reflexes are normal and symmetric. Reflexes normal.  Psychiatric:        Mood and Affect: Mood normal.        Behavior: Behavior normal.        Thought Content: Thought content normal.        Judgment: Judgment normal.           Assessment & Plan:  His HTN is stable. His atrial fibrillation is stable, and in fact today he is sinus rhythm. His OA and PMR are stable. Get fasting labs to check lipids, an A1c etc. For the memory loss, I advised him to consider going to a therapist with his wife, and he reluctantly agreed. They will check with their insurance company as to who would be in their network. Given a flu shot. He has a small recurrent right inguinal hernia, but since this is not symptomatic we agreed to just watch this  for now. Alysia Penna, MD

## 2020-06-25 ENCOUNTER — Ambulatory Visit (INDEPENDENT_AMBULATORY_CARE_PROVIDER_SITE_OTHER): Payer: Medicare Other | Admitting: General Practice

## 2020-06-25 DIAGNOSIS — I4891 Unspecified atrial fibrillation: Secondary | ICD-10-CM | POA: Diagnosis not present

## 2020-06-25 LAB — BASIC METABOLIC PANEL
BUN/Creatinine Ratio: 16 (calc) (ref 6–22)
BUN: 19 mg/dL (ref 7–25)
CO2: 30 mmol/L (ref 20–32)
Calcium: 10.3 mg/dL (ref 8.6–10.3)
Chloride: 106 mmol/L (ref 98–110)
Creat: 1.2 mg/dL — ABNORMAL HIGH (ref 0.70–1.18)
Glucose, Bld: 80 mg/dL (ref 65–99)
Potassium: 4.4 mmol/L (ref 3.5–5.3)
Sodium: 142 mmol/L (ref 135–146)

## 2020-06-25 LAB — CBC WITH DIFFERENTIAL/PLATELET
Absolute Monocytes: 898 cells/uL (ref 200–950)
Basophils Absolute: 53 cells/uL (ref 0–200)
Basophils Relative: 0.8 %
Eosinophils Absolute: 244 cells/uL (ref 15–500)
Eosinophils Relative: 3.7 %
HCT: 42.6 % (ref 38.5–50.0)
Hemoglobin: 14.1 g/dL (ref 13.2–17.1)
Lymphs Abs: 1228 cells/uL (ref 850–3900)
MCH: 34.2 pg — ABNORMAL HIGH (ref 27.0–33.0)
MCHC: 33.1 g/dL (ref 32.0–36.0)
MCV: 103.4 fL — ABNORMAL HIGH (ref 80.0–100.0)
MPV: 11.1 fL (ref 7.5–12.5)
Monocytes Relative: 13.6 %
Neutro Abs: 4178 cells/uL (ref 1500–7800)
Neutrophils Relative %: 63.3 %
Platelets: 141 10*3/uL (ref 140–400)
RBC: 4.12 10*6/uL — ABNORMAL LOW (ref 4.20–5.80)
RDW: 11.8 % (ref 11.0–15.0)
Total Lymphocyte: 18.6 %
WBC: 6.6 10*3/uL (ref 3.8–10.8)

## 2020-06-25 LAB — HEPATIC FUNCTION PANEL
AG Ratio: 2.1 (calc) (ref 1.0–2.5)
ALT: 18 U/L (ref 9–46)
AST: 29 U/L (ref 10–35)
Albumin: 4.4 g/dL (ref 3.6–5.1)
Alkaline phosphatase (APISO): 69 U/L (ref 35–144)
Bilirubin, Direct: 0.2 mg/dL (ref 0.0–0.2)
Globulin: 2.1 g/dL (calc) (ref 1.9–3.7)
Indirect Bilirubin: 0.8 mg/dL (calc) (ref 0.2–1.2)
Total Bilirubin: 1 mg/dL (ref 0.2–1.2)
Total Protein: 6.5 g/dL (ref 6.1–8.1)

## 2020-06-25 LAB — HEMOGLOBIN A1C
Hgb A1c MFr Bld: 5.6 % of total Hgb (ref <5.7)
Mean Plasma Glucose: 114 (calc)
eAG (mmol/L): 6.3 (calc)

## 2020-06-25 LAB — LIPID PANEL
Cholesterol: 192 mg/dL (ref <200)
HDL: 49 mg/dL (ref >=40)
LDL Cholesterol (Calc): 119 mg/dL (calc) — ABNORMAL HIGH
Non-HDL Cholesterol (Calc): 143 mg/dL (calc) — ABNORMAL HIGH (ref <130)
Total CHOL/HDL Ratio: 3.9 (calc) (ref <5.0)
Triglycerides: 129 mg/dL (ref <150)

## 2020-06-25 LAB — TSH: TSH: 2.68 mIU/L (ref 0.40–4.50)

## 2020-06-25 LAB — PSA: PSA: 1.55 ng/mL (ref <=4.0)

## 2020-06-25 LAB — POCT INR: INR: 3.1 — AB (ref 2.0–3.0)

## 2020-06-25 NOTE — Patient Instructions (Signed)
Pre visit review using our clinic review tool, if applicable. No additional management support is needed unless otherwise documented below in the visit note.  Skip dosage tomorrow (10/21) and then continue to take 1 tablet (5 mg) everyday except 1/2 tablet on Wednesday.  Re-check in 6 weeks.

## 2020-07-01 ENCOUNTER — Other Ambulatory Visit: Payer: Self-pay

## 2020-07-01 MED ORDER — OXYBUTYNIN CHLORIDE ER 5 MG PO TB24
5.0000 mg | ORAL_TABLET | Freq: Every day | ORAL | 3 refills | Status: DC
Start: 2020-07-01 — End: 2020-11-07

## 2020-07-21 ENCOUNTER — Other Ambulatory Visit: Payer: Self-pay | Admitting: Family Medicine

## 2020-07-28 ENCOUNTER — Other Ambulatory Visit: Payer: Self-pay | Admitting: Family Medicine

## 2020-07-28 DIAGNOSIS — Z7901 Long term (current) use of anticoagulants: Secondary | ICD-10-CM

## 2020-08-04 ENCOUNTER — Other Ambulatory Visit: Payer: Self-pay | Admitting: Family Medicine

## 2020-08-06 ENCOUNTER — Ambulatory Visit (INDEPENDENT_AMBULATORY_CARE_PROVIDER_SITE_OTHER): Payer: Medicare Other | Admitting: General Practice

## 2020-08-06 ENCOUNTER — Other Ambulatory Visit: Payer: Self-pay

## 2020-08-06 DIAGNOSIS — I4891 Unspecified atrial fibrillation: Secondary | ICD-10-CM

## 2020-08-06 LAB — POCT INR: INR: 3 (ref 2.0–3.0)

## 2020-08-06 NOTE — Patient Instructions (Signed)
Pre visit review using our clinic review tool, if applicable. No additional management support is needed unless otherwise documented below in the visit note.  Skip dosage tomorrow (12/1) and then continue to take 1 tablet (5 mg) everyday except 1/2 tablet on Wednesday.  Re-check in 6 weeks.

## 2020-09-15 ENCOUNTER — Other Ambulatory Visit: Payer: Medicare Other

## 2020-09-15 DIAGNOSIS — Z20822 Contact with and (suspected) exposure to covid-19: Secondary | ICD-10-CM

## 2020-09-17 ENCOUNTER — Other Ambulatory Visit: Payer: Self-pay

## 2020-09-17 ENCOUNTER — Ambulatory Visit (INDEPENDENT_AMBULATORY_CARE_PROVIDER_SITE_OTHER): Payer: Medicare Other | Admitting: General Practice

## 2020-09-17 ENCOUNTER — Ambulatory Visit: Payer: Medicare Other

## 2020-09-17 DIAGNOSIS — I4891 Unspecified atrial fibrillation: Secondary | ICD-10-CM | POA: Diagnosis not present

## 2020-09-17 LAB — SARS-COV-2, NAA 2 DAY TAT

## 2020-09-17 LAB — NOVEL CORONAVIRUS, NAA: SARS-CoV-2, NAA: NOT DETECTED

## 2020-09-17 LAB — POCT INR: INR: 2.7 (ref 2.0–3.0)

## 2020-09-17 NOTE — Patient Instructions (Signed)
Pre visit review using our clinic review tool, if applicable. No additional management support is needed unless otherwise documented below in the visit note.  Continue to take 1 tablet (5 mg) everyday except 1/2 tablet on Wednesday.  Re-check in 6 weeks.    

## 2020-09-18 ENCOUNTER — Encounter: Payer: Self-pay | Admitting: Family Medicine

## 2020-09-18 NOTE — Telephone Encounter (Signed)
The test says "suspected Covid" because that was the diagnosis used to order the test. The test itself was "not detected" which means negative

## 2020-09-23 DIAGNOSIS — M79641 Pain in right hand: Secondary | ICD-10-CM | POA: Diagnosis not present

## 2020-09-23 DIAGNOSIS — M79642 Pain in left hand: Secondary | ICD-10-CM | POA: Diagnosis not present

## 2020-09-23 DIAGNOSIS — M353 Polymyalgia rheumatica: Secondary | ICD-10-CM | POA: Diagnosis not present

## 2020-09-23 DIAGNOSIS — M150 Primary generalized (osteo)arthritis: Secondary | ICD-10-CM | POA: Diagnosis not present

## 2020-09-24 ENCOUNTER — Other Ambulatory Visit: Payer: Self-pay

## 2020-09-24 ENCOUNTER — Ambulatory Visit (INDEPENDENT_AMBULATORY_CARE_PROVIDER_SITE_OTHER): Payer: Medicare Other

## 2020-09-24 DIAGNOSIS — Z0000 Encounter for general adult medical examination without abnormal findings: Secondary | ICD-10-CM

## 2020-09-24 NOTE — Progress Notes (Signed)
Virtual Visit via Telephone Note  I connected with  Tanner Hensley on 09/24/20 at  1:00 PM EST by telephone and verified that I am speaking with the correct person using two identifiers.  Medicare Annual Wellness visit completed telephonically due to Covid-19 pandemic.   Persons participating in this call: This Health Coach and this patient and wife [name]  Location: Patient: Home Provider: Office   I discussed the limitations, risks, security and privacy concerns of performing an evaluation and management service by telephone and the availability of in person appointments. The patient expressed understanding and agreed to proceed.  Unable to perform video visit due to video visit attempted and failed and/or patient does not have video capability.   Some vital signs may be absent or patient reported.   Willette Brace, LPN    Subjective:   Tanner Hensley is a 80 y.o. male who presents for an Initial Medicare Annual Wellness Visit.  Review of Systems     Cardiac Risk Factors include: advanced age (>53men, >50 women);diabetes mellitus;dyslipidemia;hypertension;male gender     Objective:    There were no vitals filed for this visit. There is no height or weight on file to calculate BMI.  Advanced Directives 09/24/2020 10/14/2016 10/14/2016 10/08/2016  Does Patient Have a Medical Advance Directive? Yes No Yes Yes  Type of Paramedic of [place];Living will - Living will Living will  Does patient want to make changes to medical advance directive? - No - Patient declined No - Patient declined -  Copy of [place] in Chart? No - copy requested - - -  Would patient like information on creating a medical advance directive? - No - Patient declined - -    Current Medications (verified) Outpatient Encounter Medications as of 09/24/2020  Medication Sig  . acetaminophen (TYLENOL) 500 MG tablet Take 1,000 mg by mouth at bedtime as needed.   Marland Kitchen  alendronate (FOSAMAX) 70 MG tablet TAKE 1 TABLET BY MOUTH  WEEKLY WITH 8 OUNCE OF  PLAIN WATER 1/2 HOUR BEFORE FIRST FOOD DRINK OR MEDS.  STAY UPRIGHT FOR 1/2 HOUR  . ascorbic acid (VITAMIN C) 500 MG tablet   . aspirin EC 81 MG tablet Take 81 mg by mouth daily.  Marland Kitchen atenolol (TENORMIN) 25 MG tablet Take 25 mg by mouth daily.  Marland Kitchen b complex vitamins tablet Take 1 tablet by mouth daily.  . Biotin 1000 MCG tablet Take 5,000 mcg by mouth daily.   . calcium citrate-vitamin D (CITRACAL+D) 315-200 MG-UNIT tablet Take 1 tablet by mouth 2 (two) times daily.  Marland Kitchen donepezil (ARICEPT) 10 MG tablet TAKE 1 TABLET BY MOUTH AT  BEDTIME  . memantine (NAMENDA) 10 MG tablet TAKE 1 TABLET BY MOUTH  TWICE DAILY  . Multiple Vitamin (MULTIVITAMIN WITH MINERALS) TABS tablet Take 1 tablet by mouth daily. SENIOR MULTIVITAMIN  . olmesartan (BENICAR) 20 MG tablet TAKE 1 TABLET BY MOUTH  DAILY  . oxybutynin (DITROPAN-XL) 5 MG 24 hr tablet Take 1 tablet (5 mg total) by mouth at bedtime.  . predniSONE (DELTASONE) 10 MG tablet Take 10 mg by mouth 2 (two) times daily.  Marland Kitchen warfarin (COUMADIN) 5 MG tablet TAKE 1 TABLET BY MOUTH  DAILY EXCEPT 1.5 TABLETS ON WEDNESDAY AND SATURDAY OR  AS DIRECTED BY  ANTICOAGULATION CLINIC   No facility-administered encounter medications on file as of 09/24/2020.    Allergies (verified) Codeine, Hydrocodone-acetaminophen, Tramadol, Lipitor [atorvastatin], Robaxin [methocarbamol], and Zocor [simvastatin]   History: Past  Medical History:  Diagnosis Date  . Arthritis    sees Dr. Charlestine Night  . Atrial fibrillation ([place])    (Dr. Phylliss Blakes at [facility] and Dr. Lovena Le at [facility]. [facility] polymyalgia rheumatica (Dr. Charlestine Night)  . CHF (congestive heart failure) (HCC)    NOT SURE WHEN  . Coronary artery disease   . Diabetes mellitus type II    DIET CONTROLLED AND LOST 40 LBS  . Dysrhythmia    chronic a=fib with cardiac ablations  . GERD (gastroesophageal reflux disease)   . Gout    . High cholesterol   . Hypertension   . Insomnia   . Memory loss   . Neuromuscular disorder ([place])    POLY MYALGIA   RHEUMATICA  . Polymyalgia rheumatica ([place])    sees Dr. Charlestine Night   . Presence of permanent cardiac pacemaker    [employer]  08/2011  . Urticaria    Past Surgical History:  Procedure Laterality Date  . ATRIAL FIBRILLATION ABLATION  2006, 2008, 2013   at [facility]  by Dr. Ola Spurr  . BACK SURGERY    . CARDIAC CATHETERIZATION     has had multiple, but last one 2017 (10/13/2016)  . COLONOSCOPY W/ BIOPSIES AND POLYPECTOMY  04/05/2017   per Dr. Oretha Caprice, no polyps, no repeats needed   . CORONARY ANGIOPLASTY WITH STENT PLACEMENT  ~ 2014   "he has 2 stents" (10/13/2016)  . ESOPHAGOGASTRODUODENOSCOPY    . INGUINAL HERNIA REPAIR Right 11/28/2009   per Dr. Donnie Mesa  . KNEE ARTHROSCOPY Left 02/03/2010   per Dr. Lindwood Qua  . POSTERIOR LUMBAR FUSION  10/14/2016  . SPINE SURGERY  10/13/2016   per Dr. Lynann Bologna, L3-4 fusion  . TONSILLECTOMY     Family History  Problem Relation Age of Onset  . Coronary artery disease Other   . Hypertension Other   . Heart disease Other   . Heart disease Mother   . Heart attack Father   . Heart disease Sister   . Diabetes Sister   . Hypertension Sister   . Other Brother        brain tumor  . Hypertension Brother   . Heart disease Brother    Social History   Socioeconomic History  . Marital status: Married    Spouse name: Edmonia Lynch  . Number of children: 3  . Years of education: 48  . Highest education level: Not on file  Occupational History    Comment: retired  Tobacco Use  . Smoking status: Never Smoker  . Smokeless tobacco: Never Used  Vaping Use  . Vaping Use: Never used  Substance and Sexual Activity  . Alcohol use: No    Alcohol/week: 0.0 standard drinks  . Drug use: No  . Sexual activity: Never  Other Topics Concern  . Not on file  Social History Narrative   Lives with wife, at home    Caffeine- coffee, 2 cups, Dr Malachi Bonds 8 oz   Social Determinants of Health   Financial Resource Strain: Low Risk   . Difficulty of Paying Living Expenses: Not hard at all  Food Insecurity: No Food Insecurity  . Worried About Charity fundraiser in the Last Year: Never true  . Ran Out of Food in the Last Year: Never true  Transportation Needs: No Transportation Needs  . Lack of Transportation (Medical): No  . Lack of Transportation (Non-Medical): No  Physical Activity: Inactive  . Days of Exercise per Week: 0 days  .  Minutes of Exercise per Session: 0 min  Stress: No Stress Concern Present  . Feeling of Stress : Not at all  Social Connections: Moderately Integrated  . Frequency of Communication with Friends and Family: More than three times a week  . Frequency of Social Gatherings with Friends and Family: Once a week  . Attends Religious Services: More than 4 times per year  . Active Member of Clubs or Organizations: No  . Attends Archivist Meetings: Never  . Marital Status: Married    Tobacco Counseling Counseling given: Not Answered   Clinical Intake:  Pre-visit preparation completed: Yes  Pain : No/denies pain (flare up fingers)     Nutritional Risks: None Diabetes: Yes CBG done?: No Did pt. bring in CBG monitor from home?: No  How often do you need to have someone help you when you read instructions, pamphlets, or other written materials from your doctor or pharmacy?: 1 - Never  Diabetic?Nutrition Risk Assessment:  Has the patient had any N/V/D within the last 2 months?  No  Does the patient have any non-healing wounds?  No  Has the patient had any unintentional weight loss or weight gain?  No   Diabetes:  Is the patient diabetic?  Yes   States pre diabetes iIf diabetic, was a CBG obtained today?  No  Did the patient bring in their glucometer from home?  No  How often do you monitor your CBG's? n/a.   Financial Strains and Diabetes  Management:  Are you having any financial strains with the device, your supplies or your medication? No .  Does the patient want to be seen by Chronic Care Management for management of their diabetes?  No  Would the patient like to be referred to a Nutritionist or for Diabetic Management?  No   Diabetic Exams:  Diabetic Eye Exam: Completed 10/31/19 Diabetic Foot Exam: Overdue, Pt has been advised about the importance in completing this exam. Pt is scheduled for diabetic foot exam on next appt.   Interpreter Needed?: No  Information entered by :: Charlott Rakes, LPN   Activities of Daily Living In your present state of health, do you have any difficulty performing the following activities: 09/24/2020  Hearing? N  Vision? N  Difficulty concentrating or making decisions? Y  Comment dementia  Walking or climbing stairs? Y  Dressing or bathing? N  Doing errands, shopping? N  Preparing Food and eating ? N  Using the Toilet? N  In the past six months, have you accidently leaked urine? N  Do you have problems with loss of bowel control? N  Managing your Medications? Y  Comment wife assist  Managing your Finances? N  Housekeeping or managing your Housekeeping? N  Some recent data might be hidden    Patient Care Team: Laurey Morale, MD as PCP - General (Family Medicine) Ebbie Ridge, MD as Referring Physician (Cardiology) Germaine Pomfret, [facility] as Pharmacist (Pharmacist)  Indicate any recent Medical Services you may have received from other than [facility] providers in the past year (date may be approximate).     Assessment:   This is a routine wellness examination for [place].  Hearing/Vision screen  Hearing Screening   125Hz  250Hz  500Hz  1000Hz  2000Hz  3000Hz  4000Hz  6000Hz  8000Hz   Right ear:           Left ear:           Comments: Pt denies any hearing issues  Vision Screening Comments: Pt follows up  with Dr Kathlen Mody for annual eye exams  Dietary issues and exercise  activities discussed: Current Exercise Habits: The patient does not participate in regular exercise at present  Goals    . Chronic Care Management     CARE PLAN ENTRY (see longitudinal plan of care for additional care plan information)  Current Barriers:  . Chronic Disease Management support, education, and care coordination needs related to Hypertension, Hyperlipidemia, Diabetes, Atrial Fibrillation, Coronary Artery Disease, GERD, Osteoporosis, Osteoarthritis, Overactive Bladder and BPH    Hypertension BP Readings from Last 3 Encounters:  06/21/19 120/60  02/13/19 140/72  10/11/18 (!) 88/52   . Pharmacist Clinical Goal(s): o Over the next 90 days, patient will work with PharmD and providers to maintain BP goal <140/90 . Current regimen:   Atenolol 25 mg daily   Olmesartan 20 mg daily  . Interventions: o Discussed low salt diet and exercising as tolerated extensively . Patient self care activities - Over the next 90 days, patient will: o Check Blood Pressure 1-2 time weekly, document, and provide at future appointments o Ensure daily salt intake < 2300 mg/day  Hyperlipidemia Lab Results  Component Value Date/Time   LDLCALC 121 (H) 06/21/2019 11:57 AM   LDLDIRECT 176.1 07/20/2012 10:50 AM   . Pharmacist Clinical Goal(s): o Over the next 90 days, patient will work with PharmD and providers to achieve LDL goal < 100 . Interventions: o Discussed low cholesterol diet and exercising as tolerated extensively  Medication management . Pharmacist Clinical Goal(s): o Over the next 90 days, patient will work with PharmD and providers to maintain optimal medication adherence . Current pharmacy: [facility] . Interventions o Comprehensive medication review performed. o Continue current medication management strategy . Patient self care activities - Over the next 90 days, patient will: o Take medications as prescribed o Report any questions or concerns to PharmD  and/or provider(s)    . Patient Stated     Get back on the disc golf course      Depression Screen PHQ 2/9 Scores 09/24/2020 06/24/2020 06/24/2020 04/06/2018 01/06/2017 09/18/2015 08/09/2014  PHQ - 2 Score 0 0 0 0 0 0 0  PHQ- 9 Score - 0 - - - - -  Exception Documentation - Medical reason Medical reason - - - -    Fall Risk Fall Risk  09/24/2020 06/24/2020 06/05/2018 04/06/2018 01/06/2017  Falls in the past year? 0 1 No Yes Yes  Number falls in past yr: 0 0 - 1 1  Injury with Fall? 0 0 - No No  Follow up Falls prevention discussed - - - -    FALL RISK PREVENTION PERTAINING TO THE HOME:  Any stairs in or around the home? No  If so, are there any without handrails? No  Home free of loose throw rugs in walkways, pet beds, electrical cords, etc? Yes  Adequate lighting in your home to reduce risk of falls? Yes   ASSISTIVE DEVICES UTILIZED TO PREVENT FALLS:  Life alert? No  Use of a cane, walker or w/c? No  Grab bars in the bathroom? Yes  Shower chair or bench in shower? Yes  Elevated toilet seat or a handicapped toilet? Yes   TIMED UP AND GO:  Was the test performed? No .     Cognitive Function: MMSE - Mini Mental State Exam 06/05/2018  Orientation to time 3  Orientation to Place 4  Registration 3  Attention/ Calculation 1  Recall 2  Language- name 2 objects 2  Language- repeat 0  Language- follow 3 step command 3  Language- read & follow direction 1  Write a sentence 1  Copy design 1  Total score 21   Montreal Cognitive Assessment  11/14/2014  Visuospatial/ Executive (0/5) 5  Naming (0/3) 3  Attention: Read list of digits (0/2) 2  Attention: Read list of letters (0/1) 1  Attention: Serial 7 subtraction starting at 100 (0/3) 3  Language: Repeat phrase (0/2) 2  Language : Fluency (0/1) 1  Abstraction (0/2) 2  Delayed Recall (0/5) 4  Orientation (0/6) 6  Total 29  Adjusted Score (based on education) 29   6CIT Screen 09/24/2020  What Year? 0 points  What month? 0  points  Count back from 20 0 points  Months in reverse 0 points  Repeat phrase 0 points    Immunizations Immunization History  Administered Date(s) Administered  . Fluad Quad(high Dose 65+) 06/15/2019, 06/24/2020  . Influenza Split 06/07/2011, 06/21/2012  . Influenza Whole 07/07/2005, 06/09/2007, 06/07/2008, 06/18/2009, 07/13/2010  . Influenza, High Dose Seasonal PF 06/15/2016, 06/23/2017, 07/24/2018  . Influenza,inj,Quad PF,6+ Mos 07/26/2013, 05/20/2014, 04/28/2015  . Influenza-Unspecified 06/07/2011, 06/21/2012, 07/26/2013, 05/20/2014, 04/28/2015, 06/15/2016, 06/23/2017, 07/24/2018  . Pneumococcal Conjugate-13 08/09/2014  . Pneumococcal Polysaccharide-23 07/20/2012  . Pneumococcal-Unspecified 07/20/2012, 08/09/2014  . Td 07/13/2010    TDAP status: Due, Education has been provided regarding the importance of this vaccine. Advised may receive this vaccine at local pharmacy or Health Dept. Aware to provide a copy of the vaccination record if obtained from local pharmacy or Health Dept. Verbalized acceptance and understanding.  Flu Vaccine status: Up to date  Pneumococcal vaccine status: Due, Education has been provided regarding the importance of this vaccine. Advised may receive this vaccine at local pharmacy or Health Dept. Aware to provide a copy of the vaccination record if obtained from local pharmacy or Health Dept. Verbalized acceptance and understanding.  Covid-19 vaccine status: Declined, Education has been provided regarding the importance of this vaccine but patient still declined. Advised may receive this vaccine at local pharmacy or Health Dept.or vaccine clinic. Aware to provide a copy of the vaccination record if obtained from local pharmacy or Health Dept. Verbalized acceptance and understanding.  Qualifies for Shingles Vaccine? Yes   Zostavax completed No   Shingrix Completed?: Yes  Screening Tests Health Maintenance  Topic Date Due  . COVID-19 Vaccine (1)  10/10/2020 (Originally 08/23/1946)  . FOOT EXAM  09/24/2021 (Originally 08/23/1951)  . TETANUS/TDAP  09/24/2021 (Originally 07/13/2020)  . PNA vac Low Risk Adult (2 of 2 - PCV13) 09/24/2021 (Originally 08/10/2015)  . OPHTHALMOLOGY EXAM  10/30/2020  . HEMOGLOBIN A1C  12/23/2020  . INFLUENZA VACCINE  Completed    Health Maintenance  There are no preventive care reminders to display for this patient.  Colorectal cancer screening: No longer required.    Additional Screening:  Vision Screening: Recommended annual ophthalmology exams for early detection of glaucoma and other disorders of the eye. Is the patient up to date with their annual eye exam?  Yes  Who is the provider or what is the name of the office in which the patient attends annual eye exams? Dr weaver    Dental Screening: Recommended annual dental exams for proper oral hygiene  Community Resource Referral / Chronic Care Management: CRR required this visit?  No   CCM required this visit?  No      Plan:     I have personally reviewed and noted the following in the patient's chart:   .  Medical and social history . Use of alcohol, tobacco or illicit drugs  . Current medications and supplements . Functional ability and status . Nutritional status . Physical activity . Advanced directives . List of other physicians . Hospitalizations, surgeries, and ER visits in previous 12 months . Vitals . Screenings to include cognitive, depression, and falls . Referrals and appointments  In addition, I have reviewed and discussed with patient certain preventive protocols, quality metrics, and best practice recommendations. A written personalized care plan for preventive services as well as general preventive health recommendations were provided to patient.     Willette Brace, LPN   0/16/0109   Nurse Notes: None

## 2020-09-24 NOTE — Patient Instructions (Addendum)
Tanner Hensley , Thank you for taking time to come for your Medicare Wellness Visit. I appreciate your ongoing commitment to your health goals. Please review the following plan we discussed and let me know if I can assist you in the future.   Screening recommendations/referrals: Colonoscopy: no longer required Recommended yearly ophthalmology/optometry visit for glaucoma screening and checkup Recommended yearly dental visit for hygiene and checkup  Vaccinations: Influenza vaccine: Done 06/24/20 Pneumococcal vaccine: Due and discussed Tdap vaccine: Due and discussed Shingles vaccine: Shingrix discussed. Please contact your pharmacy for coverage information.    Covid-19: Declined  Advanced directives: Please bring a copy of your health care power of attorney and living will to the office at your convenience.  Conditions/risks identified: Get back to disc golfing for exercise  Next appointment: Follow up in one year for your annual wellness visit.   Preventive Care 10 Years and Older, Male Preventive care refers to lifestyle choices and visits with your health care provider that can promote health and wellness. What does preventive care include?  A yearly physical exam. This is also called an annual well check.  Dental exams once or twice a year.  Routine eye exams. Ask your health care provider how often you should have your eyes checked.  Personal lifestyle choices, including:  Daily care of your teeth and gums.  Regular physical activity.  Eating a healthy diet.  Avoiding tobacco and drug use.  Limiting alcohol use.  Practicing safe sex.  Taking low doses of aspirin every day.  Taking vitamin and mineral supplements as recommended by your health care provider. What happens during an annual well check? The services and screenings done by your health care provider during your annual well check will depend on your age, overall health, lifestyle risk factors, and family  history of disease. Counseling  Your health care provider may ask you questions about your:  Alcohol use.  Tobacco use.  Drug use.  Emotional well-being.  Home and relationship well-being.  Sexual activity.  Eating habits.  History of falls.  Memory and ability to understand (cognition).  Work and work Statistician. Screening  You may have the following tests or measurements:  Height, weight, and BMI.  Blood pressure.  Lipid and cholesterol levels. These may be checked every 5 years, or more frequently if you are over 13 years old.  Skin check.  Lung cancer screening. You may have this screening every year starting at age 98 if you have a 30-pack-year history of smoking and currently smoke or have quit within the past 15 years.  Fecal occult blood test (FOBT) of the stool. You may have this test every year starting at age 65.  Flexible sigmoidoscopy or colonoscopy. You may have a sigmoidoscopy every 5 years or a colonoscopy every 10 years starting at age 84.  Prostate cancer screening. Recommendations will vary depending on your family history and other risks.  Hepatitis C blood test.  Hepatitis B blood test.  Sexually transmitted disease (STD) testing.  Diabetes screening. This is done by checking your blood sugar (glucose) after you have not eaten for a while (fasting). You may have this done every 1-3 years.  Abdominal aortic aneurysm (AAA) screening. You may need this if you are a current or former smoker.  Osteoporosis. You may be screened starting at age 15 if you are at high risk. Talk with your health care provider about your test results, treatment options, and if necessary, the need for more tests. Vaccines  Your  health care provider may recommend certain vaccines, such as:  Influenza vaccine. This is recommended every year.  Tetanus, diphtheria, and acellular pertussis (Tdap, Td) vaccine. You may need a Td booster every 10 years.  Zoster vaccine.  You may need this after age 72.  Pneumococcal 13-valent conjugate (PCV13) vaccine. One dose is recommended after age 31.  Pneumococcal polysaccharide (PPSV23) vaccine. One dose is recommended after age 69. Talk to your health care provider about which screenings and vaccines you need and how often you need them. This information is not intended to replace advice given to you by your health care provider. Make sure you discuss any questions you have with your health care provider. Document Released: 09/19/2015 Document Revised: 05/12/2016 Document Reviewed: 06/24/2015 Elsevier Interactive Patient Education  2017 [place] Prevention in the Home Falls can cause injuries. They can happen to people of all ages. There are many things you can do to make your home safe and to help prevent falls. What can I do on the outside of my home?  Regularly fix the edges of walkways and driveways and fix any cracks.  Remove anything that might make you trip as you walk through a door, such as a raised step or threshold.  Trim any bushes or trees on the path to your home.  Use bright outdoor lighting.  Clear any walking paths of anything that might make someone trip, such as rocks or tools.  Regularly check to see if handrails are loose or broken. Make sure that both sides of any steps have handrails.  Any raised decks and porches should have guardrails on the edges.  Have any leaves, snow, or ice cleared regularly.  Use sand or salt on walking paths during winter.  Clean up any spills in your garage right away. This includes oil or grease spills. What can I do in the bathroom?  Use night lights.  Install grab bars by the toilet and in the tub and shower. Do not use towel bars as grab bars.  Use non-skid mats or decals in the tub or shower.  If you need to sit down in the shower, use a plastic, non-slip stool.  Keep the floor dry. Clean up any water that spills on the floor as soon  as it happens.  Remove soap buildup in the tub or shower regularly.  Attach bath mats securely with double-sided non-slip rug tape.  Do not have throw rugs and other things on the floor that can make you trip. What can I do in the bedroom?  Use night lights.  Make sure that you have a light by your bed that is easy to reach.  Do not use any sheets or blankets that are too big for your bed. They should not hang down onto the floor.  Have a firm chair that has side arms. You can use this for support while you get dressed.  Do not have throw rugs and other things on the floor that can make you trip. What can I do in the kitchen?  Clean up any spills right away.  Avoid walking on wet floors.  Keep items that you use a lot in easy-to-reach places.  If you need to reach something above you, use a strong step stool that has a grab bar.  Keep electrical cords out of the way.  Do not use floor polish or wax that makes floors slippery. If you must use wax, use non-skid floor wax.  Do not  have throw rugs and other things on the floor that can make you trip. What can I do with my stairs?  Do not leave any items on the stairs.  Make sure that there are handrails on both sides of the stairs and use them. Fix handrails that are broken or loose. Make sure that handrails are as long as the stairways.  Check any carpeting to make sure that it is firmly attached to the stairs. Fix any carpet that is loose or worn.  Avoid having throw rugs at the top or bottom of the stairs. If you do have throw rugs, attach them to the floor with carpet tape.  Make sure that you have a light switch at the top of the stairs and the bottom of the stairs. If you do not have them, ask someone to add them for you. What else can I do to help prevent falls?  Wear shoes that:  Do not have high heels.  Have rubber bottoms.  Are comfortable and fit you well.  Are closed at the toe. Do not wear sandals.  If  you use a stepladder:  Make sure that it is fully opened. Do not climb a closed stepladder.  Make sure that both sides of the stepladder are locked into place.  Ask someone to hold it for you, if possible.  Clearly mark and make sure that you can see:  Any grab bars or handrails.  First and last steps.  Where the edge of each step is.  Use tools that help you move around (mobility aids) if they are needed. These include:  Canes.  Walkers.  Scooters.  Crutches.  Turn on the lights when you go into a dark area. Replace any light bulbs as soon as they burn out.  Set up your furniture so you have a clear path. Avoid moving your furniture around.  If any of your floors are uneven, fix them.  If there are any pets around you, be aware of where they are.  Review your medicines with your doctor. Some medicines can make you feel dizzy. This can increase your chance of falling. Ask your doctor what other things that you can do to help prevent falls. This information is not intended to replace advice given to you by your health care provider. Make sure you discuss any questions you have with your health care provider. Document Released: 06/19/2009 Document Revised: 01/29/2016 Document Reviewed: 09/27/2014 Elsevier Interactive Patient Education  2017 [employer].

## 2020-10-14 DIAGNOSIS — Z20822 Contact with and (suspected) exposure to covid-19: Secondary | ICD-10-CM | POA: Diagnosis not present

## 2020-10-14 DIAGNOSIS — U071 COVID-19: Secondary | ICD-10-CM | POA: Diagnosis not present

## 2020-10-15 ENCOUNTER — Telehealth (INDEPENDENT_AMBULATORY_CARE_PROVIDER_SITE_OTHER): Payer: Medicare Other | Admitting: Family Medicine

## 2020-10-15 ENCOUNTER — Encounter: Payer: Self-pay | Admitting: Family Medicine

## 2020-10-15 ENCOUNTER — Other Ambulatory Visit: Payer: Self-pay | Admitting: Family Medicine

## 2020-10-15 VITALS — BP 134/75 | HR 62 | Temp 98.0°F | Wt 158.0 lb

## 2020-10-15 DIAGNOSIS — U071 COVID-19: Secondary | ICD-10-CM | POA: Diagnosis not present

## 2020-10-15 MED ORDER — MOLNUPIRAVIR 200 MG PO CAPS: 4.0000 | ORAL_CAPSULE | Freq: Two times a day (BID) | ORAL | 0 refills | Status: DC

## 2020-10-15 MED FILL — MOLNUPIRAVIR 200 MG CAPS: 200 | 5 days supply | Qty: 40 | Fill #0

## 2020-10-15 NOTE — Progress Notes (Signed)
Subjective:    Patient ID: Tanner Hensley, male    DOB: 10/09/1940, 80 y.o.   MRN: [id number]  HPI Virtual Visit via Video Note  I connected with the patient on 10/15/20 at  9:45 AM EST by a video enabled telemedicine application and verified that I am speaking with the correct person using two identifiers.  Location patient: home Location provider:work or home office Persons participating in the virtual visit: patient, provider  I discussed the limitations of evaluation and management by telemedicine and the availability of in person appointments. The patient expressed understanding and agreed to proceed.   HPI: Here with his wife to discuss a Covid-19 infection. He beagn to have a ST, a dry cough, and some body aches 2 days ago. No SOB or chest pain, no fever, no NVD. Yesterday he tested [facility] the virus [facility] a [facility]. Today he feels a little better. His appetite is poor but he is drinking fluids. His wife is getting tested later today.    ROS: See pertinent positives and negatives per HPI.  Past Medical History:  Diagnosis Date  . Arthritis    sees Dr. Charlestine Night  . Atrial fibrillation ([place])    (Dr. Phylliss Blakes at [facility] and Dr. Lovena Le at [facility]. [facility] polymyalgia rheumatica (Dr. Charlestine Night)  . CHF (congestive heart failure) ([facility])    NOT SURE WHEN  . Coronary artery disease   . Diabetes mellitus type II    DIET CONTROLLED AND LOST 40 LBS  . Dysrhythmia    chronic a=fib with cardiac ablations  . GERD (gastroesophageal reflux disease)   . Gout   . High cholesterol   . Hypertension   . Insomnia   . Memory loss   . Neuromuscular disorder ([place])    POLY MYALGIA   RHEUMATICA  . Polymyalgia rheumatica ([place])    sees Dr. Charlestine Night   . Presence of permanent cardiac pacemaker    [employer]  08/2011  . Urticaria     Past Surgical History:  Procedure Laterality Date  . ATRIAL FIBRILLATION ABLATION  2006, 2008, 2013   at [facility]   by Dr. Ola Spurr  . BACK SURGERY    . CARDIAC CATHETERIZATION     has had multiple, but last one 2017 (10/13/2016)  . COLONOSCOPY W/ BIOPSIES AND POLYPECTOMY  04/05/2017   per Dr. Oretha Caprice, no polyps, no repeats needed   . CORONARY ANGIOPLASTY WITH STENT PLACEMENT  ~ 2014   "he has 2 stents" (10/13/2016)  . ESOPHAGOGASTRODUODENOSCOPY    . INGUINAL HERNIA REPAIR Right 11/28/2009   per Dr. Donnie Mesa  . KNEE ARTHROSCOPY Left 02/03/2010   per Dr. Lindwood Qua  . POSTERIOR LUMBAR FUSION  10/14/2016  . SPINE SURGERY  10/13/2016   per Dr. Lynann Bologna, L3-4 fusion  . TONSILLECTOMY      Family History  Problem Relation Age of Onset  . Coronary artery disease Other   . Hypertension Other   . Heart disease Other   . Heart disease Mother   . Heart attack Father   . Heart disease Sister   . Diabetes Sister   . Hypertension Sister   . Other Brother        brain tumor  . Hypertension Brother   . Heart disease Brother      Current Outpatient Medications:  .  acetaminophen (TYLENOL) 500 MG tablet, Take 1,000 mg by mouth at bedtime as needed. , Disp: , Rfl:  .  alendronate (  FOSAMAX) 70 MG tablet, TAKE 1 TABLET BY MOUTH  WEEKLY WITH 8 OUNCE OF  PLAIN WATER 1/2 HOUR BEFORE FIRST FOOD DRINK OR MEDS.  STAY UPRIGHT FOR 1/2 HOUR, Disp: 12 tablet, Rfl: 3 .  ascorbic acid (VITAMIN C) 500 MG tablet, , Disp: , Rfl:  .  aspirin EC 81 MG tablet, Take 81 mg by mouth daily., Disp: , Rfl:  .  atenolol (TENORMIN) 25 MG tablet, Take 25 mg by mouth daily., Disp: , Rfl: 0 .  b complex vitamins tablet, Take 1 tablet by mouth daily., Disp: , Rfl:  .  Biotin 1000 MCG tablet, Take 5,000 mcg by mouth daily. , Disp: , Rfl:  .  calcium citrate-vitamin D (CITRACAL+D) 315-200 MG-UNIT tablet, Take 1 tablet by mouth 2 (two) times daily., Disp: , Rfl:  .  donepezil (ARICEPT) 10 MG tablet, TAKE 1 TABLET BY MOUTH AT  BEDTIME, Disp: 90 tablet, Rfl: 3 .  memantine (NAMENDA) 10 MG tablet, TAKE 1 TABLET BY MOUTH  TWICE DAILY,  Disp: 180 tablet, Rfl: 3 .  Multiple Vitamin (MULTIVITAMIN WITH MINERALS) TABS tablet, Take 1 tablet by mouth daily. SENIOR MULTIVITAMIN, Disp: , Rfl:  .  olmesartan (BENICAR) 20 MG tablet, TAKE 1 TABLET BY MOUTH  DAILY, Disp: 90 tablet, Rfl: 1 .  oxybutynin (DITROPAN-XL) 5 MG 24 hr tablet, Take 1 tablet (5 mg total) by mouth at bedtime., Disp: 90 tablet, Rfl: 3 .  predniSONE (DELTASONE) 10 MG tablet, Take 10 mg by mouth 2 (two) times daily., Disp: , Rfl:  .  warfarin (COUMADIN) 5 MG tablet, TAKE 1 TABLET BY MOUTH  DAILY EXCEPT 1.5 TABLETS ON WEDNESDAY AND SATURDAY OR  AS DIRECTED BY  ANTICOAGULATION CLINIC, Disp: 105 tablet, Rfl: 3  EXAM:  VITALS per patient if applicable:  GENERAL: alert, oriented, appears well and in no acute distress  HEENT: atraumatic, conjunttiva clear, no obvious abnormalities on inspection of external nose and ears  NECK: normal movements of the head and neck  LUNGS: on inspection no signs of respiratory distress, breathing rate appears normal, no obvious gross SOB, gasping or wheezing  CV: no obvious cyanosis  MS: moves all visible extremities without noticeable abnormality  PSYCH/NEURO: pleasant and cooperative, no obvious depression or anxiety, speech and thought processing grossly intact  ASSESSMENT AND PLAN: He has a Covid infection. We will treat with 5 days of Molnupiravir. He will quarantine for 10 days. Alysia Penna, MD  Discussed the following assessment and plan:  No diagnosis found.     I discussed the assessment and treatment plan with the patient. The patient was provided an opportunity to ask questions and all were answered. The patient agreed with the plan and demonstrated an understanding of the instructions.   The patient was advised to call back or seek an in-person evaluation if the symptoms worsen or if the condition fails to improve as anticipated.     Review of Systems     Objective:   Physical Exam        Assessment &  Plan:

## 2020-10-23 ENCOUNTER — Encounter (HOSPITAL_COMMUNITY): Payer: Self-pay | Admitting: Emergency Medicine

## 2020-10-23 ENCOUNTER — Encounter: Payer: Self-pay | Admitting: Family Medicine

## 2020-10-23 ENCOUNTER — Other Ambulatory Visit (HOSPITAL_COMMUNITY): Payer: Self-pay | Admitting: Emergency Medicine

## 2020-10-23 ENCOUNTER — Emergency Department (HOSPITAL_COMMUNITY)
Admission: EM | Admit: 2020-10-23 | Discharge: 2020-10-23 | Disposition: A | Discharge status: Home / Self Care | Payer: Medicare Other | Attending: Emergency Medicine | Admitting: Emergency Medicine

## 2020-10-23 ENCOUNTER — Emergency Department (HOSPITAL_COMMUNITY): Payer: Medicare Other

## 2020-10-23 DIAGNOSIS — Z950 Presence of cardiac pacemaker: Secondary | ICD-10-CM | POA: Insufficient documentation

## 2020-10-23 DIAGNOSIS — E860 Dehydration: Secondary | ICD-10-CM | POA: Diagnosis not present

## 2020-10-23 DIAGNOSIS — I509 Heart failure, unspecified: Secondary | ICD-10-CM | POA: Diagnosis not present

## 2020-10-23 DIAGNOSIS — Z7901 Long term (current) use of anticoagulants: Secondary | ICD-10-CM | POA: Insufficient documentation

## 2020-10-23 DIAGNOSIS — Z8616 Personal history of COVID-19: Secondary | ICD-10-CM | POA: Insufficient documentation

## 2020-10-23 DIAGNOSIS — Z7982 Long term (current) use of aspirin: Secondary | ICD-10-CM | POA: Insufficient documentation

## 2020-10-23 DIAGNOSIS — U071 COVID-19: Secondary | ICD-10-CM | POA: Insufficient documentation

## 2020-10-23 DIAGNOSIS — Z79899 Other long term (current) drug therapy: Secondary | ICD-10-CM | POA: Insufficient documentation

## 2020-10-23 DIAGNOSIS — E119 Type 2 diabetes mellitus without complications: Secondary | ICD-10-CM | POA: Insufficient documentation

## 2020-10-23 DIAGNOSIS — F0390 Unspecified dementia without behavioral disturbance: Secondary | ICD-10-CM | POA: Diagnosis not present

## 2020-10-23 DIAGNOSIS — I2510 Atherosclerotic heart disease of native coronary artery without angina pectoris: Secondary | ICD-10-CM | POA: Diagnosis not present

## 2020-10-23 DIAGNOSIS — R0902 Hypoxemia: Secondary | ICD-10-CM | POA: Diagnosis not present

## 2020-10-23 DIAGNOSIS — Z743 Need for continuous supervision: Secondary | ICD-10-CM | POA: Diagnosis not present

## 2020-10-23 DIAGNOSIS — R55 Syncope and collapse: Secondary | ICD-10-CM | POA: Diagnosis not present

## 2020-10-23 DIAGNOSIS — I110 Hypertensive heart disease with heart failure: Secondary | ICD-10-CM | POA: Insufficient documentation

## 2020-10-23 DIAGNOSIS — J9811 Atelectasis: Secondary | ICD-10-CM | POA: Diagnosis not present

## 2020-10-23 DIAGNOSIS — R404 Transient alteration of awareness: Secondary | ICD-10-CM | POA: Diagnosis not present

## 2020-10-23 DIAGNOSIS — R6889 Other general symptoms and signs: Secondary | ICD-10-CM | POA: Diagnosis not present

## 2020-10-23 DIAGNOSIS — R0602 Shortness of breath: Secondary | ICD-10-CM | POA: Diagnosis not present

## 2020-10-23 LAB — CBC WITH DIFFERENTIAL/PLATELET
Abs Immature Granulocytes: 0.03 10*3/uL (ref 0.00–0.07)
Basophils Absolute: 0 10*3/uL (ref 0.0–0.1)
Basophils Relative: 0 %
Eosinophils Absolute: 0 10*3/uL (ref 0.0–0.5)
Eosinophils Relative: 1 %
HCT: 40.9 % (ref 39.0–52.0)
Hemoglobin: 13.7 g/dL (ref 13.0–17.0)
Immature Granulocytes: 1 %
Lymphocytes Relative: 13 %
Lymphs Abs: 0.6 10*3/uL — ABNORMAL LOW (ref 0.7–4.0)
MCH: 35.4 pg — ABNORMAL HIGH (ref 26.0–34.0)
MCHC: 33.5 g/dL (ref 30.0–36.0)
MCV: 105.7 fL — ABNORMAL HIGH (ref 80.0–100.0)
Monocytes Absolute: 0.7 10*3/uL (ref 0.1–1.0)
Monocytes Relative: 15 %
Neutro Abs: 3.1 10*3/uL (ref 1.7–7.7)
Neutrophils Relative %: 70 %
Platelets: 124 10*3/uL — ABNORMAL LOW (ref 150–400)
RBC: 3.87 MIL/uL — ABNORMAL LOW (ref 4.22–5.81)
RDW: 11.9 % (ref 11.5–15.5)
WBC: 4.4 10*3/uL (ref 4.0–10.5)
nRBC: 0 % (ref 0.0–0.2)

## 2020-10-23 LAB — BASIC METABOLIC PANEL
Anion gap: 7 (ref 5–15)
BUN: 23 mg/dL (ref 8–23)
CO2: 30 mmol/L (ref 22–32)
Calcium: 9.1 mg/dL (ref 8.9–10.3)
Chloride: 101 mmol/L (ref 98–111)
Creatinine, Ser: 1.17 mg/dL (ref 0.61–1.24)
GFR, Estimated: >60 mL/min (ref >60)
Glucose, Bld: 113 mg/dL — ABNORMAL HIGH (ref 70–99)
Potassium: 3.7 mmol/L (ref 3.5–5.1)
Sodium: 138 mmol/L (ref 135–145)

## 2020-10-23 LAB — CBG MONITORING, ED: Glucose-Capillary: 100 mg/dL — ABNORMAL HIGH (ref 70–99)

## 2020-10-23 LAB — PROTIME-INR
INR: 2.2 — ABNORMAL HIGH (ref 0.8–1.2)
Prothrombin Time: 23.4 seconds — ABNORMAL HIGH (ref 11.4–15.2)

## 2020-10-23 MED ORDER — BENZONATATE 100 MG PO CAPS: 100.0000 mg | ORAL_CAPSULE | Freq: Three times a day (TID) | ORAL | 0 refills | Status: DC

## 2020-10-23 MED ORDER — SODIUM CHLORIDE 0.9 % IV BOLUS
1000.0000 mL | Freq: Once | INTRAVENOUS | Status: AC
  Administered 2020-10-23: 1000 mL via INTRAVENOUS

## 2020-10-23 MED ORDER — ONDANSETRON 4 MG PO TBDP: ORAL_TABLET | ORAL | 0 refills | Status: DC

## 2020-10-23 NOTE — ED Triage Notes (Signed)
Pt BIB EMS from home c/o syncope and hypotension. Dx COVID + on 2/7. Family reports pt was not feeling well and took BP 53/35. Pt had urgent need to defecate, walked to the bathroom and had syncopal episode. Last BP with EMS 99/64. 20G LAC. 500cc NS. HR 70s. CBG 182. 98% RA. Afebrile.

## 2020-10-23 NOTE — Discharge Instructions (Addendum)
Eat and drink as well as you can for the next 48 hours.  Try to increase your fluid intake, typically they recommend 64 ounces of fluid intake a day.  Take tylenol 2 pills 4 times a day.  Drink plenty of fluids.  Return for worsening shortness of breath, headache, confusion. Follow up with your family doctor.

## 2020-10-23 NOTE — ED Notes (Signed)
Pt given water and sandwich at this time for PO/fluid challenge.

## 2020-10-23 NOTE — Progress Notes (Signed)
TOC CM spoke to pt's wife and offered choice for [facility]. Wife agreeable to [facility] agency that will accept referral. Referral sent to [facility] for review. Pt independent at home. No DME needed. Pt has cane at home if needed. Wife at home to assist with care. ED provider updated.[place], [place] ED TOC CM [phone]

## 2020-10-23 NOTE — ED Notes (Signed)
Pt ambulated in room, maintained O2 sats between 98-100%. Pt reported no dizziness, lightheadedness, or feeling faint. RN made aware.

## 2020-10-23 NOTE — ED Notes (Signed)
Pt ambulated to restroom independently with no issues.

## 2020-10-23 NOTE — ED Provider Notes (Signed)
[facility] DEPT Provider Note   CSN: [id number] Arrival date & time: 10/23/20  1500     History Chief Complaint  Patient presents with  . Loss of Consciousness    Tanner Hensley is a 80 y.o. male.  80 yo M with a chief complaint of a syncopal event.  Patient has had Covid for the past week.  Coughing having some low oxygen levels measured at home.  They had called her family doctor today.  He had 1 loose stool and after which lost consciousness.  The patient does not remember the event.  Has some dementia at baseline.  He denies chest pain denies shortness of breath denies abdominal pain denies nausea or vomiting.  Has been eating and drinking less.  Denies headaches or neck pain.  The history is provided by the patient.  Illness Severity:  Moderate Onset quality:  Gradual Duration:  2 days Timing:  Constant Progression:  Worsening Chronicity:  New Associated symptoms: congestion, cough, diarrhea (1 loose stool, not dark or bloody), fever and myalgias   Associated symptoms: no abdominal pain, no chest pain, no headaches, no rash, no shortness of breath and no vomiting        Past Medical History:  Diagnosis Date  . Arthritis    sees Dr. Charlestine Night  . Atrial fibrillation ([place])    (Dr. Phylliss Blakes at [facility] and Dr. Lovena Le at [facility]. [facility] polymyalgia rheumatica (Dr. Charlestine Night)  . CHF (congestive heart failure) (HCC)    NOT SURE WHEN  . Coronary artery disease   . Diabetes mellitus type II    DIET CONTROLLED AND LOST 40 LBS  . Dysrhythmia    chronic a=fib with cardiac ablations  . GERD (gastroesophageal reflux disease)   . Gout   . High cholesterol   . Hypertension   . Insomnia   . Memory loss   . Neuromuscular disorder ([place])    POLY MYALGIA   RHEUMATICA  . Polymyalgia rheumatica ([place])    sees Dr. Charlestine Night   . Presence of permanent cardiac pacemaker    [employer]  08/2011  . Urticaria     Patient  Active Problem List   Diagnosis Date Noted  . COVID-19 virus infection 10/15/2020  . Arthritis 02/13/2019  . Pulmonary infiltrates on CXR 06/21/2018  . Upper airway cough syndrome 06/20/2018  . Type 2 diabetes mellitus with other specified complication ([place]) 22/48/2500  . Lower extremity weakness   . Radiculopathy 10/13/2016  . Syncope 10/13/2016  . CAD (coronary artery disease) 10/13/2016  . Memory loss 02/22/2014  . 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 KNEE SPRAIN 11/10/2009  . INGUINAL HERNIA 09/17/2009  . CHEST WALL PAIN, ACUTE 09/17/2009  . Dyslipidemia 09/13/2008  . OVERACTIVE BLADDER 01/26/2008  . 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 Essential hypertension 05/22/2007  . GERD 05/22/2007  . ATRIAL FIBRILLATION, CHRONIC 05/01/2007    Past Surgical History:  Procedure Laterality Date  . ATRIAL FIBRILLATION ABLATION  2006, 2008, 2013   at [facility]  by Dr. Ola Spurr  . BACK SURGERY    . CARDIAC CATHETERIZATION     has had multiple, but last one 2017 (10/13/2016)  . COLONOSCOPY W/ BIOPSIES AND POLYPECTOMY  04/05/2017   per Dr. Oretha Caprice, no polyps, no repeats needed   . CORONARY ANGIOPLASTY WITH STENT PLACEMENT  ~ 2014   "he has 2 stents" (10/13/2016)  . ESOPHAGOGASTRODUODENOSCOPY    . INGUINAL HERNIA  REPAIR Right 11/28/2009   per Dr. Donnie Mesa  . KNEE ARTHROSCOPY Left 02/03/2010   per Dr. Lindwood Qua  . POSTERIOR LUMBAR FUSION  10/14/2016  . SPINE SURGERY  10/13/2016   per Dr. Lynann Bologna, L3-4 fusion  . TONSILLECTOMY         Family History  Problem Relation Age of Onset  . Coronary artery disease Other   . Hypertension Other   . Heart disease Other   . Heart disease Mother   . Heart attack Father   . Heart disease Sister   . Diabetes Sister   . Hypertension Sister   . Other Brother        brain tumor  . Hypertension Brother   . Heart disease Brother     Social History   Tobacco Use  . Smoking  status: Never Smoker  . Smokeless tobacco: Never Used  Vaping Use  . Vaping Use: Never used  Substance Use Topics  . Alcohol use: No    Alcohol/week: 0.0 standard drinks  . Drug use: No    Home Medications Prior to Admission medications   Medication Sig Start Date End Date Taking? Authorizing Provider  acetaminophen (TYLENOL) 500 MG tablet Take 1,000 mg by mouth at bedtime as needed.     [provider]  alendronate (FOSAMAX) 70 MG tablet TAKE 1 TABLET BY MOUTH  WEEKLY WITH 8 OUNCE OF  PLAIN WATER 1/2 HOUR BEFORE FIRST FOOD DRINK OR MEDS.  STAY UPRIGHT FOR 1/2 HOUR 03/07/20   Laurey Morale, MD  ascorbic acid (VITAMIN C) 500 MG tablet  10/31/19   [provider]  aspirin EC 81 MG tablet Take 81 mg by mouth daily.    [provider]  atenolol (TENORMIN) 25 MG tablet Take 25 mg by mouth daily. 09/14/17   [provider]  b complex vitamins tablet Take 1 tablet by mouth daily.    [provider]  benzonatate (TESSALON) 100 MG capsule Take 1 capsule (100 mg total) by mouth every 8 (eight) hours. 10/23/20   Deno Etienne, DO  Biotin 1000 MCG tablet Take 5,000 mcg by mouth daily.     [provider]  calcium citrate-vitamin D (CITRACAL+D) 315-200 MG-UNIT tablet Take 1 tablet by mouth 2 (two) times daily.    [provider]  donepezil (ARICEPT) 10 MG tablet TAKE 1 TABLET BY MOUTH AT  BEDTIME 12/31/19   Laurey Morale, MD  memantine (NAMENDA) 10 MG tablet TAKE 1 TABLET BY MOUTH  TWICE DAILY 08/05/20   Laurey Morale, MD  Molnupiravir 200 MG CAPS Take 4 capsules (800 mg total) by mouth 2 (two) times daily. 10/15/20   Laurey Morale, MD  Multiple Vitamin (MULTIVITAMIN WITH MINERALS) TABS tablet Take 1 tablet by mouth daily. SENIOR MULTIVITAMIN    [provider]  olmesartan (BENICAR) 20 MG tablet TAKE 1 TABLET BY MOUTH  DAILY 07/22/20   Laurey Morale, MD  ondansetron (ZOFRAN ODT) 4 MG disintegrating tablet 4mg  ODT q4 hours prn nausea/vomit  10/23/20   Deno Etienne, DO  oxybutynin (DITROPAN-XL) 5 MG 24 hr tablet Take 1 tablet (5 mg total) by mouth at bedtime. 07/01/20   Laurey Morale, MD  predniSONE (DELTASONE) 10 MG tablet Take 10 mg by mouth 2 (two) times daily. 09/23/20   [provider]  warfarin (COUMADIN) 5 MG tablet TAKE 1 TABLET BY MOUTH  DAILY EXCEPT 1.5 TABLETS ON WEDNESDAY AND SATURDAY OR  AS DIRECTED BY  ANTICOAGULATION  CLINIC 07/28/20   Laurey Morale, MD    Allergies    Codeine, Hydrocodone-acetaminophen, Tramadol, Lipitor [atorvastatin], Robaxin [methocarbamol], and Zocor [simvastatin]  Review of Systems   Review of Systems  Constitutional: Positive for chills and fever.  HENT: Positive for congestion. Negative for facial swelling.   Eyes: Negative for discharge and visual disturbance.  Respiratory: Positive for cough. Negative for shortness of breath.   Cardiovascular: Negative for chest pain and palpitations.  Gastrointestinal: Positive for diarrhea (1 loose stool, not dark or bloody). Negative for abdominal pain and vomiting.  Musculoskeletal: Positive for myalgias. Negative for arthralgias.  Skin: Negative for color change and rash.  Neurological: Positive for syncope. Negative for tremors and headaches.  Psychiatric/Behavioral: Negative for confusion and dysphoric mood.    Physical Exam Updated Vital Signs BP (!) 103/50   Pulse 60   Temp 98.5 F (36.9 C) (Oral)   Resp (!) 22   Ht 5\' 5"  (1.651 m)   Wt 69.9 kg   SpO2 96%   BMI 25.63 kg/m   Physical Exam Vitals and nursing note reviewed.  Constitutional:      Appearance: He is well-developed and well-nourished.  HENT:     Head: Normocephalic and atraumatic.  Eyes:     Extraocular Movements: EOM normal.     Pupils: Pupils are equal, round, and reactive to light.  Neck:     Vascular: No JVD.  Cardiovascular:     Rate and Rhythm: Normal rate and regular rhythm.     Heart sounds: No murmur heard. No friction rub. No gallop.    Pulmonary:     Effort: No respiratory distress.     Breath sounds: No wheezing.  Abdominal:     General: There is no distension.     Tenderness: There is no guarding or rebound.  Musculoskeletal:        General: Normal range of motion.     Cervical back: Normal range of motion and neck supple.  Skin:    Coloration: Skin is not pale.     Findings: No rash.  Neurological:     Mental Status: He is alert and oriented to person, place, and time.  Psychiatric:        Mood and Affect: Mood and affect normal.        Behavior: Behavior normal.     ED Results / Procedures / Treatments   Labs (all labs ordered are listed, but only abnormal results are displayed) Labs Reviewed  CBC WITH DIFFERENTIAL/PLATELET - Abnormal; Notable for the following components:      Result Value   RBC 3.87 (*)    MCV 105.7 (*)    MCH 35.4 (*)    Platelets 124 (*)    Lymphs Abs 0.6 (*)    All other components within normal limits  BASIC METABOLIC PANEL - Abnormal; Notable for the following components:   Glucose, Bld 113 (*)    All other components within normal limits  PROTIME-INR - Abnormal; Notable for the following components:   Prothrombin Time 23.4 (*)    INR 2.2 (*)    All other components within normal limits  CBG MONITORING, ED - Abnormal; Notable for the following components:   Glucose-Capillary 100 (*)    All other components within normal limits    EKG EKG Interpretation  Date/Time:  Thursday October 23 2020 15:39:05 EST Ventricular Rate:  60 PR Interval:    QRS Duration: 145 QT Interval:  454 QTC Calculation: 454 R  Axis:   -79 Text Interpretation: Ventricular-paced complexes No further analysis attempted due to paced rhythm No significant change since last tracing Confirmed by Deno Etienne [phone]) on 10/23/2020 5:15:59 PM   Radiology DG Chest Port 1 View  Result Date: 10/23/2020 CLINICAL DATA:  Shortness of breath.  COVID-19 positive EXAM: PORTABLE CHEST 1 VIEW COMPARISON:   June 20, 2018; November 30, 2018 FINDINGS: There is slight bibasilar atelectasis. Lungs elsewhere clear. Heart size and pulmonary vascularity are normal. Pacemaker leads attached to right atrium and right ventricle. No adenopathy. There is aortic atherosclerosis. There is degenerative change in each shoulder. IMPRESSION: Mild bibasilar atelectasis. Lungs elsewhere clear. Stable cardiac silhouette. Pacemaker leads attached to right atrium and right ventricle. Aortic Atherosclerosis (ICD10-I70.0). Electronically Signed   By: Lowella Grip III M.D.   On: 10/23/2020 15:59    Procedures Procedures   Medications Ordered in ED Medications  sodium chloride 0.9 % bolus 1,000 mL (0 mLs Intravenous Stopped 10/23/20 1729)    ED Course  I have reviewed the triage vital signs and the nursing notes.  Pertinent labs & imaging results that were available during my care of the patient were reviewed by me and considered in my medical decision making (see chart for details).    MDM Rules/Calculators/A&P                          80 yo M with a chief complaint of a syncopal event.  This had no prodrome.  Otherwise sounds most likely be vasovagal.  Patient was on the toilet when he lost consciousness.  Quickly back to baseline and feels much better currently.  Was noted to have a low blood pressure immediately upon arrival which is improved, also consistent with possible vasovagal.  Will give a bolus of IV fluids here check lab work.  Patient does have a pacemaker so we will interrogate to assess for arrhythmia.  If work-up is negative and patient continues to feel well we will likely try to discharge home as long as he is not hypoxic.  Patient was able to ambulate without hypoxia.  Tells me he feels way better than when he arrived.  Patient's daughter has arrived and she is concerned about his wellbeing at home.  Feels like him and his wife have both been having trouble getting around.  Not really eating and  drinking.  I discussed with social work.  We will try to provide home health services.  Given follow-up with the Covid clinic.  Will prescribe nausea and cough medicine.  8:23 PM:  I have discussed the diagnosis/risks/treatment options with the patient and family and believe the pt to be eligible for discharge home to follow-up with PCP. We also discussed returning to the ED immediately if new or worsening sx occur. We discussed the sx which are most concerning (e.g., sudden worsening sob, fever, inability to tolerate by mouth) that necessitate immediate return. Medications administered to the patient during their visit and any new prescriptions provided to the patient are listed below.  Medications given during this visit Medications  sodium chloride 0.9 % bolus 1,000 mL (0 mLs Intravenous Stopped 10/23/20 1729)     The patient appears reasonably screen and/or stabilized for discharge and I doubt any other medical condition or other [facility] requiring further screening, evaluation, or treatment in the ED at this time prior to discharge.   Final Clinical Impression(s) / ED Diagnoses Final diagnoses:  Syncope and collapse  COVID-19  virus infection  Dehydration    Rx / DC Orders ED Discharge Orders         [place]        10/23/20 1902    Face-to-face encounter (required for Medicare/Medicaid patients)       Comments: I Cecilio Asper certify that this patient is under my care and that I, or a nurse practitioner or physician's assistant working with me, had a face-to-face encounter that meets the physician face-to-face encounter requirements with this patient on 10/23/2020. The encounter with the patient was in whole, or in part for the following medical condition(s) which is the primary reason for home health care (List medical condition): Patient with COVID-19 infection unable to leave the house safely without assistance.   10/23/20 1902    ondansetron (ZOFRAN ODT) 4 MG  disintegrating tablet  Status:  Discontinued        10/23/20 2003    benzonatate (TESSALON) 100 MG capsule  Every 8 hours,   Status:  Discontinued        10/23/20 2003    benzonatate (TESSALON) 100 MG capsule  Every 8 hours        10/23/20 2003    ondansetron (ZOFRAN ODT) 4 MG disintegrating tablet        10/23/20 2003           Deno Etienne, DO 10/23/20 2023

## 2020-10-24 MED ORDER — BENZONATATE 100 MG PO CAPS: 100.0000 mg | ORAL_CAPSULE | Freq: Four times a day (QID) | ORAL | 1 refills | Status: DC | PRN

## 2020-10-24 NOTE — Telephone Encounter (Signed)
Please call in Benzonatate 100 mg to take every 6 hours prn cough. #60 with one rf. Do not stop the BP meds. If he needs to see someone, [facility] a good place to go

## 2020-10-24 NOTE — Progress Notes (Signed)
10/24/2020 1132 am TOC CM contacted [facility] rep, Amy. Able to accept referral for [facility] [place], [place] ED TOC CM [phone]

## 2020-10-25 ENCOUNTER — Other Ambulatory Visit: Payer: Self-pay

## 2020-10-25 ENCOUNTER — Emergency Department (HOSPITAL_COMMUNITY): Payer: Medicare Other

## 2020-10-25 ENCOUNTER — Encounter (HOSPITAL_COMMUNITY): Payer: Self-pay | Admitting: Emergency Medicine

## 2020-10-25 ENCOUNTER — Observation Stay (HOSPITAL_COMMUNITY)
Admission: EM | Admit: 2020-10-25 | Discharge: 2020-10-28 | Disposition: A | Discharge status: Home / Self Care | Payer: Medicare Other | Attending: Internal Medicine | Admitting: Internal Medicine

## 2020-10-25 DIAGNOSIS — I2510 Atherosclerotic heart disease of native coronary artery without angina pectoris: Secondary | ICD-10-CM | POA: Diagnosis present

## 2020-10-25 DIAGNOSIS — R42 Dizziness and giddiness: Secondary | ICD-10-CM

## 2020-10-25 DIAGNOSIS — I110 Hypertensive heart disease with heart failure: Secondary | ICD-10-CM | POA: Insufficient documentation

## 2020-10-25 DIAGNOSIS — Z7982 Long term (current) use of aspirin: Secondary | ICD-10-CM | POA: Insufficient documentation

## 2020-10-25 DIAGNOSIS — K4020 Bilateral inguinal hernia, without obstruction or gangrene, not specified as recurrent: Secondary | ICD-10-CM | POA: Diagnosis not present

## 2020-10-25 DIAGNOSIS — N200 Calculus of kidney: Secondary | ICD-10-CM | POA: Diagnosis not present

## 2020-10-25 DIAGNOSIS — E119 Type 2 diabetes mellitus without complications: Secondary | ICD-10-CM | POA: Diagnosis not present

## 2020-10-25 DIAGNOSIS — U071 COVID-19: Secondary | ICD-10-CM | POA: Diagnosis present

## 2020-10-25 DIAGNOSIS — R55 Syncope and collapse: Secondary | ICD-10-CM | POA: Diagnosis not present

## 2020-10-25 DIAGNOSIS — K429 Umbilical hernia without obstruction or gangrene: Secondary | ICD-10-CM | POA: Diagnosis not present

## 2020-10-25 DIAGNOSIS — R1084 Generalized abdominal pain: Secondary | ICD-10-CM | POA: Insufficient documentation

## 2020-10-25 DIAGNOSIS — F0390 Unspecified dementia without behavioral disturbance: Secondary | ICD-10-CM | POA: Diagnosis not present

## 2020-10-25 DIAGNOSIS — I4891 Unspecified atrial fibrillation: Secondary | ICD-10-CM | POA: Diagnosis not present

## 2020-10-25 DIAGNOSIS — I509 Heart failure, unspecified: Secondary | ICD-10-CM | POA: Diagnosis not present

## 2020-10-25 DIAGNOSIS — E1169 Type 2 diabetes mellitus with other specified complication: Secondary | ICD-10-CM | POA: Diagnosis present

## 2020-10-25 DIAGNOSIS — R6889 Other general symptoms and signs: Secondary | ICD-10-CM | POA: Diagnosis not present

## 2020-10-25 DIAGNOSIS — R404 Transient alteration of awareness: Secondary | ICD-10-CM | POA: Diagnosis not present

## 2020-10-25 DIAGNOSIS — I712 Thoracic aortic aneurysm, without rupture: Secondary | ICD-10-CM | POA: Diagnosis not present

## 2020-10-25 DIAGNOSIS — Z79899 Other long term (current) drug therapy: Secondary | ICD-10-CM | POA: Insufficient documentation

## 2020-10-25 DIAGNOSIS — R2681 Unsteadiness on feet: Secondary | ICD-10-CM | POA: Diagnosis not present

## 2020-10-25 DIAGNOSIS — I10 Essential (primary) hypertension: Secondary | ICD-10-CM | POA: Diagnosis present

## 2020-10-25 DIAGNOSIS — Z743 Need for continuous supervision: Secondary | ICD-10-CM | POA: Diagnosis not present

## 2020-10-25 DIAGNOSIS — Z7901 Long term (current) use of anticoagulants: Secondary | ICD-10-CM | POA: Diagnosis not present

## 2020-10-25 DIAGNOSIS — D539 Nutritional anemia, unspecified: Secondary | ICD-10-CM

## 2020-10-25 LAB — URINALYSIS, ROUTINE W REFLEX MICROSCOPIC
Bilirubin Urine: NEGATIVE
Glucose, UA: NEGATIVE mg/dL
Hgb urine dipstick: NEGATIVE
Ketones, ur: 5 mg/dL — AB
Leukocytes,Ua: NEGATIVE
Nitrite: NEGATIVE
Protein, ur: NEGATIVE mg/dL
Specific Gravity, Urine: 1.043 — ABNORMAL HIGH (ref 1.005–1.030)
pH: 5 (ref 5.0–8.0)

## 2020-10-25 LAB — LACTIC ACID, PLASMA
Lactic Acid, Venous: 1 mmol/L (ref 0.5–1.9)
Lactic Acid, Venous: 0.9 mmol/L (ref 0.5–1.9)

## 2020-10-25 LAB — CBC WITH DIFFERENTIAL/PLATELET
Abs Immature Granulocytes: 0.03 10*3/uL (ref 0.00–0.07)
Basophils Absolute: 0 10*3/uL (ref 0.0–0.1)
Basophils Relative: 0 %
Eosinophils Absolute: 0 10*3/uL (ref 0.0–0.5)
Eosinophils Relative: 0 %
HCT: 36.2 % — ABNORMAL LOW (ref 39.0–52.0)
Hemoglobin: 12.3 g/dL — ABNORMAL LOW (ref 13.0–17.0)
Immature Granulocytes: 1 %
Lymphocytes Relative: 8 %
Lymphs Abs: 0.4 10*3/uL — ABNORMAL LOW (ref 0.7–4.0)
MCH: 35.5 pg — ABNORMAL HIGH (ref 26.0–34.0)
MCHC: 34 g/dL (ref 30.0–36.0)
MCV: 104.6 fL — ABNORMAL HIGH (ref 80.0–100.0)
Monocytes Absolute: 0.6 10*3/uL (ref 0.1–1.0)
Monocytes Relative: 11 %
Neutro Abs: 4.3 10*3/uL (ref 1.7–7.7)
Neutrophils Relative %: 80 %
Platelets: 115 10*3/uL — ABNORMAL LOW (ref 150–400)
RBC: 3.46 MIL/uL — ABNORMAL LOW (ref 4.22–5.81)
RDW: 12 % (ref 11.5–15.5)
WBC: 5.4 10*3/uL (ref 4.0–10.5)
nRBC: 0 % (ref 0.0–0.2)

## 2020-10-25 LAB — TROPONIN I (HIGH SENSITIVITY)
Troponin I (High Sensitivity): 20 ng/L — ABNORMAL HIGH (ref <18)
Troponin I (High Sensitivity): 20 ng/L — ABNORMAL HIGH (ref <18)

## 2020-10-25 LAB — COMPREHENSIVE METABOLIC PANEL
ALT: 17 U/L (ref 0–44)
AST: 25 U/L (ref 15–41)
Albumin: 3.6 g/dL (ref 3.5–5.0)
Alkaline Phosphatase: 50 U/L (ref 38–126)
Anion gap: 7 (ref 5–15)
BUN: 19 mg/dL (ref 8–23)
CO2: 27 mmol/L (ref 22–32)
Calcium: 9.1 mg/dL (ref 8.9–10.3)
Chloride: 105 mmol/L (ref 98–111)
Creatinine, Ser: 1.14 mg/dL (ref 0.61–1.24)
GFR, Estimated: >60 mL/min (ref >60)
Glucose, Bld: 78 mg/dL (ref 70–99)
Potassium: 4.3 mmol/L (ref 3.5–5.1)
Sodium: 139 mmol/L (ref 135–145)
Total Bilirubin: 0.8 mg/dL (ref 0.3–1.2)
Total Protein: 5.8 g/dL — ABNORMAL LOW (ref 6.5–8.1)

## 2020-10-25 LAB — PROTIME-INR
INR: 2.6 — ABNORMAL HIGH (ref 0.8–1.2)
Prothrombin Time: 26.8 seconds — ABNORMAL HIGH (ref 11.4–15.2)

## 2020-10-25 LAB — LIPASE, BLOOD: Lipase: 28 U/L (ref 11–51)

## 2020-10-25 MED ORDER — ASCORBIC ACID 500 MG PO TABS
500.0000 mg | ORAL_TABLET | Freq: Every day | ORAL | Status: DC
Start: 2020-10-25 — End: 2020-10-28
  Administered 2020-10-25 – 2020-10-28 (×4): 500 mg via ORAL
  Filled 2020-10-25 (×4): qty 1

## 2020-10-25 MED ORDER — BIOTIN 1000 MCG PO TABS: 5000.0000 ug | ORAL_TABLET | Freq: Every day | ORAL | Status: DC

## 2020-10-25 MED ORDER — SODIUM CHLORIDE 0.9 % IV SOLN
200.0000 mg | Freq: Once | INTRAVENOUS | Status: AC
  Administered 2020-10-25: 200 mg via INTRAVENOUS
  Filled 2020-10-25: qty 200

## 2020-10-25 MED ORDER — ONDANSETRON HCL 4 MG PO TABS
4.0000 mg | ORAL_TABLET | Freq: Four times a day (QID) | ORAL | Status: DC | PRN
  Administered 2020-10-27 (×2): 4 mg via ORAL
  Filled 2020-10-25 (×2): qty 1

## 2020-10-25 MED ORDER — IOHEXOL 350 MG/ML SOLN
100.0000 mL | Freq: Once | INTRAVENOUS | Status: AC | PRN
  Administered 2020-10-25: 100 mL via INTRAVENOUS

## 2020-10-25 MED ORDER — ONDANSETRON HCL 4 MG/2ML IJ SOLN: 4.0000 mg | Freq: Four times a day (QID) | INTRAMUSCULAR | Status: DC | PRN

## 2020-10-25 MED ORDER — B COMPLEX PO TABS: 1.0000 | ORAL_TABLET | Freq: Every day | ORAL | Status: DC

## 2020-10-25 MED ORDER — MEMANTINE HCL 10 MG PO TABS
10.0000 mg | ORAL_TABLET | Freq: Two times a day (BID) | ORAL | Status: DC
  Administered 2020-10-25 – 2020-10-28 (×6): 10 mg via ORAL
  Filled 2020-10-25 (×6): qty 1

## 2020-10-25 MED ORDER — SODIUM CHLORIDE 0.9 % IV SOLN: INTRAVENOUS | Status: DC

## 2020-10-25 MED ORDER — CALCIUM CITRATE-VITAMIN D 315-200 MG-UNIT PO TABS: 1.0000 | ORAL_TABLET | Freq: Two times a day (BID) | ORAL | Status: DC

## 2020-10-25 MED ORDER — SODIUM CHLORIDE 0.9 % IV BOLUS
500.0000 mL | Freq: Once | INTRAVENOUS | Status: AC
  Administered 2020-10-25: 500 mL via INTRAVENOUS

## 2020-10-25 MED ORDER — CHOLECALCIFEROL 10 MCG (400 UNIT) PO TABS
200.0000 [IU] | ORAL_TABLET | Freq: Two times a day (BID) | ORAL | Status: DC
  Administered 2020-10-25 – 2020-10-28 (×6): 200 [IU] via ORAL
  Filled 2020-10-25 (×7): qty 1

## 2020-10-25 MED ORDER — GUAIFENESIN-DM 100-10 MG/5ML PO SYRP
10.0000 mL | ORAL_SOLUTION | ORAL | Status: DC | PRN
  Administered 2020-10-26 – 2020-10-28 (×6): 10 mL via ORAL
  Filled 2020-10-25 (×8): qty 10

## 2020-10-25 MED ORDER — SODIUM CHLORIDE 0.9% FLUSH
3.0000 mL | Freq: Two times a day (BID) | INTRAVENOUS | Status: DC
  Administered 2020-10-26 – 2020-10-28 (×4): 3 mL via INTRAVENOUS

## 2020-10-25 MED ORDER — WARFARIN - PHARMACIST DOSING INPATIENT: Freq: Every day | Status: DC

## 2020-10-25 MED ORDER — IPRATROPIUM-ALBUTEROL 20-100 MCG/ACT IN AERS
1.0000 | INHALATION_SPRAY | Freq: Four times a day (QID) | RESPIRATORY_TRACT | Status: DC
  Administered 2020-10-26 – 2020-10-28 (×10): 1 via RESPIRATORY_TRACT
  Filled 2020-10-25: qty 4

## 2020-10-25 MED ORDER — WARFARIN SODIUM 2.5 MG PO TABS: 2.5000 mg | ORAL_TABLET | ORAL | Status: DC

## 2020-10-25 MED ORDER — SODIUM CHLORIDE 0.9 % IV SOLN
100.0000 mg | Freq: Every day | INTRAVENOUS | Status: AC
  Administered 2020-10-26 – 2020-10-27 (×2): 100 mg via INTRAVENOUS
  Filled 2020-10-25 (×2): qty 20

## 2020-10-25 MED ORDER — WARFARIN SODIUM 5 MG PO TABS
5.0000 mg | ORAL_TABLET | Freq: Once | ORAL | Status: AC
  Administered 2020-10-25: 5 mg via ORAL
  Filled 2020-10-25: qty 1

## 2020-10-25 MED ORDER — CALCIUM CITRATE 950 (200 CA) MG PO TABS
200.0000 mg | ORAL_TABLET | Freq: Two times a day (BID) | ORAL | Status: DC
  Administered 2020-10-25 – 2020-10-28 (×6): 200 mg via ORAL
  Filled 2020-10-25 (×8): qty 1

## 2020-10-25 MED ORDER — ASPIRIN EC 81 MG PO TBEC
81.0000 mg | DELAYED_RELEASE_TABLET | Freq: Every day | ORAL | Status: DC
  Administered 2020-10-26 – 2020-10-28 (×3): 81 mg via ORAL
  Filled 2020-10-25 (×3): qty 1

## 2020-10-25 MED ORDER — DONEPEZIL HCL 10 MG PO TABS
10.0000 mg | ORAL_TABLET | Freq: Every day | ORAL | Status: DC
  Administered 2020-10-25 – 2020-10-27 (×3): 10 mg via ORAL
  Filled 2020-10-25 (×3): qty 1

## 2020-10-25 MED ORDER — ADULT MULTIVITAMIN W/MINERALS CH
1.0000 | ORAL_TABLET | Freq: Every day | ORAL | Status: DC
  Administered 2020-10-25 – 2020-10-28 (×4): 1 via ORAL
  Filled 2020-10-25 (×4): qty 1

## 2020-10-25 MED ORDER — B COMPLEX-C PO TABS
1.0000 | ORAL_TABLET | Freq: Every day | ORAL | Status: DC
  Administered 2020-10-26 – 2020-10-28 (×3): 1 via ORAL
  Filled 2020-10-25 (×3): qty 1

## 2020-10-25 NOTE — H&P (Signed)
History and Physical    Tanner Hensley UDJ:[id number] DOB: 15-Nov-1940 DOA: 10/25/2020  PCP: Laurey Morale, MD  Patient coming from: Home  Chief Complaint: weakness, faint  HPI: Tanner Hensley is a 80 y.o. male with medical history significant of a fib, HTN, DM2. Presenting with hypotension and near syncope. Seen 2 days ago in ED for syncopal episode. He presented as a vasovagal episode after using the toilet. He was evaluated and deemed stable. EDP discussed w/u with family and they felt they were ok to go home. The patient did ok through this morning. Home health services was checking on his wife -- both patient and wife at home with COVID -- when they noticed that the patient seemed weak as if he were going to pass out. They assessed him and found his SBP to be 50's to 60's. They gave him form fluids and called for EMS. He denies any other aggravating or alleviating factors.   Of note, he is not vaccinated for COVID. He did complete a course of molnupiravir.  ED Course: He was given fluids and perked up. UA was ok. Labs were stable. [facility] was called for admission.   Review of Systems:  No chest pain, palpitations. Reports some dyspnea, cough, ab pain, lightheadedness, dizziness.  Review of systems is otherwise negative for all not mentioned in HPI.   PMHx Past Medical History:  Diagnosis Date  . Arthritis    sees Dr. Charlestine Night  . Atrial fibrillation ([place])    (Dr. Phylliss Blakes at [facility] and Dr. Lovena Le at [facility]. [facility] polymyalgia rheumatica (Dr. Charlestine Night)  . CHF (congestive heart failure) (H[facility])    NOT SURE WHEN  . Coronary artery disease   . Diabetes mellitus type II    DIET CONTROLLED AND LOST 40 LBS  . Dysrhythmia    chronic a=fib with cardiac ablations  . GERD (gastroesophageal reflux disease)   . Gout   . High cholesterol   . Hypertension   . Insomnia   . Memory loss   . Neuromuscular disorder ([place])    POLY MYALGIA   RHEUMATICA  . Polymyalgia  rheumatica ([place])    sees Dr. Charlestine Night   . Presence of permanent cardiac pacemaker    [employer]  08/2011  . Urticaria     PSHx Past Surgical History:  Procedure Laterality Date  . ATRIAL FIBRILLATION ABLATION  2006, 2008, 2013   at [facility]  by Dr. Ola Spurr  . BACK SURGERY    . CARDIAC CATHETERIZATION     has had multiple, but last one 2017 (10/13/2016)  . COLONOSCOPY W/ BIOPSIES AND POLYPECTOMY  04/05/2017   per Dr. Oretha Caprice, no polyps, no repeats needed   . CORONARY ANGIOPLASTY WITH STENT PLACEMENT  ~ 2014   "he has 2 stents" (10/13/2016)  . ESOPHAGOGASTRODUODENOSCOPY    . INGUINAL HERNIA REPAIR Right 11/28/2009   per Dr. Donnie Mesa  . KNEE ARTHROSCOPY Left 02/03/2010   per Dr. Lindwood Qua  . POSTERIOR LUMBAR FUSION  10/14/2016  . SPINE SURGERY  10/13/2016   per Dr. Lynann Bologna, L3-4 fusion  . TONSILLECTOMY      SocHx  reports that he has never smoked. He has never used smokeless tobacco. He reports that he does not drink alcohol and does not use drugs.  Allergies  Allergen Reactions  . Codeine Rash and Other (See Comments)    Dizziness, low blood pressure and heart rate  . Hydrocodone-Acetaminophen Rash and Other (See Comments)  Dizziness, low blood pressure and heart rate  . Tramadol Nausea And Vomiting  . Lipitor [Atorvastatin] Other (See Comments)    Myalgias   . Robaxin [Methocarbamol] Other (See Comments)    Dropped blood pressure too much  . Zocor [Simvastatin] Other (See Comments)    myalgias    FamHx Family History  Problem Relation Age of Onset  . Coronary artery disease Other   . Hypertension Other   . Heart disease Other   . Heart disease Mother   . Heart attack Father   . Heart disease Sister   . Diabetes Sister   . Hypertension Sister   . Other Brother        brain tumor  . Hypertension Brother   . Heart disease Brother     Prior to Admission medications   Medication Sig Start Date End Date Taking? Authorizing  Provider  acetaminophen (TYLENOL) 500 MG tablet Take 1,000 mg by mouth every 4 (four) hours as needed for mild pain.   Yes [provider]  alendronate (FOSAMAX) 70 MG tablet TAKE 1 TABLET BY MOUTH  WEEKLY WITH 8 OUNCE OF  PLAIN WATER 1/2 HOUR BEFORE FIRST FOOD DRINK OR MEDS.  STAY UPRIGHT FOR 1/2 HOUR Patient taking differently: Take 70 mg by mouth every Friday. 03/07/20  Yes Laurey Morale, MD  ascorbic acid (VITAMIN C) 500 MG tablet Take 500 mg by mouth daily. 10/31/19  Yes [provider]  aspirin EC 81 MG tablet Take 81 mg by mouth daily.   Yes [provider]  atenolol (TENORMIN) 25 MG tablet Take 25 mg by mouth daily. 09/14/17  Yes [provider]  b complex vitamins tablet Take 1 tablet by mouth daily.   Yes [provider]  benzonatate (TESSALON) 100 MG capsule Take 1 capsule (100 mg total) by mouth every 6 (six) hours as needed for cough. Patient taking differently: Take 100 mg by mouth every 8 (eight) hours as needed for cough. 10/24/20  Yes Laurey Morale, MD  Biotin 1000 MCG tablet Take 5,000 mcg by mouth daily.    Yes [provider]  calcium citrate-vitamin D (CITRACAL+D) 315-200 MG-UNIT tablet Take 1 tablet by mouth 2 (two) times daily.   Yes [provider]  donepezil (ARICEPT) 10 MG tablet TAKE 1 TABLET BY MOUTH AT  BEDTIME Patient taking differently: Take 10 mg by mouth at bedtime. 12/31/19  Yes Laurey Morale, MD  memantine (NAMENDA) 10 MG tablet TAKE 1 TABLET BY MOUTH  TWICE DAILY Patient taking differently: Take 10 mg by mouth 2 (two) times daily. 08/05/20  Yes Laurey Morale, MD  Multiple Vitamin (MULTIVITAMIN WITH MINERALS) TABS tablet Take 1 tablet by mouth daily. SENIOR MULTIVITAMIN   Yes [provider]  olmesartan (BENICAR) 20 MG tablet TAKE 1 TABLET BY MOUTH  DAILY Patient taking differently: Take 20 mg by mouth daily. 07/22/20  Yes Laurey Morale, MD  ondansetron (ZOFRAN ODT) 4 MG disintegrating tablet  4mg  ODT q4 hours prn nausea/vomit Patient taking differently: Take 4 mg by mouth every 4 (four) hours as needed for nausea or vomiting. 10/23/20  Yes Deno Etienne, DO  oxybutynin (DITROPAN-XL) 5 MG 24 hr tablet Take 1 tablet (5 mg total) by mouth at bedtime. 07/01/20  Yes Laurey Morale, MD  warfarin (COUMADIN) 5 MG tablet TAKE 1 TABLET BY MOUTH  DAILY EXCEPT 1.5 TABLETS ON WEDNESDAY AND SATURDAY OR  AS DIRECTED BY  ANTICOAGULATION CLINIC Patient taking differently: Take 2.5-5  mg by mouth See admin instructions. Take 5mg  every day except Wednesday (2.5mg ) 07/28/20  Yes Laurey Morale, MD  Molnupiravir 200 MG CAPS Take 4 capsules (800 mg total) by mouth 2 (two) times daily. Patient not taking: No sig reported 10/15/20   Laurey Morale, MD    Physical Exam: Vitals:   10/25/20 1400 10/25/20 1415 10/25/20 1427 10/25/20 1500  BP: (!) 115/59 (!) 115/59  116/73  Pulse: (!) 59 60 60 61  Resp: 16 16  14   Temp:      TempSrc:      SpO2: 95% 95% 94% 99%  Weight:      Height:        General: 79 y.o. male resting in bed in NAD Eyes: PERRL, normal sclera ENMT: Nares patent w/o discharge, orophaynx clear, dentition normal, ears w/o discharge/lesions/ulcers Neck: Supple, trachea midline Cardiovascular: brady, +S1, S2, no m/g/r, equal pulses throughout Respiratory: mild bronchospasm noted, good air movement, normal WOB on RA GI: BS+, NDNT, no masses noted, no organomegaly noted MSK: No e/c/c Skin: No rashes, bruises, ulcerations noted Neuro: A&O x 3, no focal deficits Psyc: Appropriate interaction and affect, calm/cooperative  Labs on Admission: I have personally reviewed following labs and imaging studies  CBC: Recent Labs  Lab 10/23/20 1550 10/25/20 1110  WBC 4.4 5.4  NEUTROABS 3.1 4.3  HGB 13.7 12.3*  HCT 40.9 36.2*  MCV 105.7* 104.6*  PLT 124* 850*   Basic Metabolic Panel: Recent Labs  Lab 10/23/20 1550 10/25/20 1110  NA 138 139  K 3.7 4.3  CL 101 105  CO2 30 27  GLUCOSE 113* 78   BUN 23 19  CREATININE 1.17 1.14  CALCIUM 9.1 9.1   GFR: Estimated Creatinine Clearance: 45 mL/min (by C-G formula based on SCr of 1.14 mg/dL). Liver Function Tests: Recent Labs  Lab 10/25/20 1110  AST 25  ALT 17  ALKPHOS 50  BILITOT 0.8  PROT 5.8*  ALBUMIN 3.6   Recent Labs  Lab 10/25/20 1110  LIPASE 28   No results for input(s): AMMONIA in the last 168 hours. Coagulation Profile: Recent Labs  Lab 10/23/20 1550  INR 2.2*   Cardiac Enzymes: No results for input(s): CKTOTAL, CKMB, CKMBINDEX, TROPONINI in the last 168 hours. BNP (last 3 results) No results for input(s): PROBNP in the last 8760 hours. HbA1C: No results for input(s): HGBA1C in the last 72 hours. CBG: Recent Labs  Lab 10/23/20 1612  GLUCAP 100*   Lipid Profile: No results for input(s): CHOL, HDL, LDLCALC, TRIG, CHOLHDL, LDLDIRECT in the last 72 hours. Thyroid Function Tests: No results for input(s): TSH, T4TOTAL, FREET4, T3FREE, THYROIDAB in the last 72 hours. Anemia Panel: No results for input(s): VITAMINB12, FOLATE, FERRITIN, TIBC, IRON, RETICCTPCT in the last 72 hours. Urine analysis:    Component Value Date/Time   COLORURINE YELLOW 10/25/2020 1459   APPEARANCEUR CLEAR 10/25/2020 1459   LABSPEC 1.043 (H) 10/25/2020 1459   PHURINE 5.0 10/25/2020 1459   GLUCOSEU NEGATIVE 10/25/2020 1459   HGBUR NEGATIVE 10/25/2020 1459   BILIRUBINUR NEGATIVE 10/25/2020 1459   BILIRUBINUR neg 06/21/2019 1121   KETONESUR 5 (A) 10/25/2020 1459   PROTEINUR NEGATIVE 10/25/2020 1459   UROBILINOGEN 0.2 06/21/2019 1121   UROBILINOGEN 0.2 01/02/2010 1145   NITRITE NEGATIVE 10/25/2020 1459   LEUKOCYTESUR NEGATIVE 10/25/2020 1459    Radiological Exams on Admission: DG Chest Port 1 View  Result Date: 10/23/2020 CLINICAL DATA:  Shortness of breath.  COVID-19 positive EXAM: PORTABLE CHEST 1 VIEW  COMPARISON:  June 20, 2018; November 30, 2018 FINDINGS: There is slight bibasilar atelectasis. Lungs elsewhere clear.  Heart size and pulmonary vascularity are normal. Pacemaker leads attached to right atrium and right ventricle. No adenopathy. There is aortic atherosclerosis. There is degenerative change in each shoulder. IMPRESSION: Mild bibasilar atelectasis. Lungs elsewhere clear. Stable cardiac silhouette. Pacemaker leads attached to right atrium and right ventricle. Aortic Atherosclerosis (ICD10-I70.0). Electronically Signed   By: Lowella Grip III M.D.   On: 10/23/2020 15:59   CT Angio Chest/Abd/Pel for Dissection W and/or Wo Contrast  Result Date: 10/25/2020 CLINICAL DATA:  Abdominal pain. Sharp chest discomfort. Hypertension. COVID-19 positive patient. EXAM: CT ANGIOGRAPHY CHEST, ABDOMEN AND PELVIS TECHNIQUE: Non-contrast CT of the chest was initially obtained. Multidetector CT imaging through the chest, abdomen and pelvis was performed using the standard protocol during bolus administration of intravenous contrast. Multiplanar reconstructed images and MIPs were obtained and reviewed to evaluate the vascular anatomy. CONTRAST:  128mL OMNIPAQUE IOHEXOL 350 MG/ML SOLN COMPARISON:  CT pulmonary angiogram October 14, 2016 FINDINGS: CTA CHEST FINDINGS Cardiovascular: Calcified atherosclerosis is seen in the thoracic aorta. The ascending thoracic aorta measures 4.1 cm near the root of the aorta. The distal thoracic aortic arch measures up to 3.9 cm on series 6, image 24, unchanged since February of 2018. The mid descending thoracic aorta measures 3.6 cm. The distal descending thoracic aorta is tortuous. No dissection. Three-vessel calcified atherosclerosis seen in the coronary arteries. The heart is unremarkable. The central pulmonary arteries are normal in caliber. Evaluation for pulmonary emboli is limited due to timing of contrast on this study. Within these limitations, no emboli are identified. Mediastinum/Nodes: Small pleural effusions are seen. The thyroid is normal in appearance. Shotty nodes in the mediastinum  are unchanged since 2018, of no acute significance. The esophagus is normal. No pericardial effusion. Lungs/Pleura: Mild patchy ground-glass opacities are seen in the lungs bilaterally, involving all lobes, and most prominent peripherally. No suspicious nodules or masses. Central airways are normal. No pneumothorax. Musculoskeletal: No chest wall abnormality. No acute or significant osseous findings. Review of the MIP images confirms the above findings. CTA ABDOMEN AND PELVIS FINDINGS VASCULAR Aorta: The abdominal aorta is tortuous with atherosclerotic changes. No aneurysm or dissection. Celiac: Patent without evidence of aneurysm, dissection, vasculitis or significant stenosis. SMA: Calcified atherosclerosis near the origin of the SMA without severe stenosis. Renals: Atherosclerosis is seen in the origin of the renal arteries bilaterally without critical stenosis. IMA: Patent without evidence of aneurysm, dissection, vasculitis or significant stenosis. Inflow: Patent without evidence of aneurysm, dissection, vasculitis or significant stenosis. Veins: No obvious venous abnormality within the limitations of this arterial phase study. Review of the MIP images confirms the above findings. NON-VASCULAR Hepatobiliary: No focal liver abnormality is seen. No gallstones, gallbladder wall thickening, or biliary dilatation. Pancreas: Unremarkable. No pancreatic ductal dilatation or surrounding inflammatory changes. Spleen: Normal in size without focal abnormality. Adrenals/Urinary Tract: Adrenal glands are normal. Evaluation for renal stones is difficult given arterial phase of imaging. However, there appear to be nonobstructive stones in the lower poles of both kidneys. No suspicious masses. No hydronephrosis or perinephric stranding. No ureteral stones or bladder abnormalities identified. Stomach/Bowel: The stomach and small bowel are normal. Scattered colonic diverticulosis is seen without diverticulitis. The colon is  otherwise normal. The appendix is normal. Lymphatic: No lymphadenopathy identified. Reproductive: Uterus and bilateral adnexa are unremarkable. Other: Fat containing inguinal hernia is identified. A small fat containing umbilical hernia is noted. No free air free fluid.  Musculoskeletal: Pedicle rods and screws are seen at L3 and L4. Degenerative changes are seen in the thoracolumbar spine. Review of the MIP images confirms the above findings. IMPRESSION: 1. The proximal ascending thoracic aorta measures 4.1 cm. The distal thoracic aortic arch measures 3.9 cm. No dissection. Recommend annual imaging followup by CTA or MRA. This recommendation follows 2010 ACCF/AHA/AATS/ACR/ASA/SCA/SCAI/SIR/STS/SVM Guidelines for the Diagnosis and Management of Patients with Thoracic Aortic Disease. Circulation.2010; 121: J242-A834. Aortic aneurysm NOS (ICD10-I71.9) 2. Patchy ground-glass opacities in the peripheries of the lungs bilaterally, likely COVID-19 pneumonia given history. Recommend short-term follow-up CT imaging to ensure resolution. 3. Atherosclerosis in the thoracic and abdominal aorta. Atherosclerosis in the coronary arteries. 4. Nonobstructive stones in the lower poles of the kidneys. 5. Colonic diverticulosis without diverticulitis. 6. Bilateral fat containing inguinal hernias. Small fat containing umbilical hernia. 7. Degenerative changes in the spine. Electronically Signed   By: Dorise Bullion III M.D   On: 10/25/2020 12:52    EKG: Independently reviewed. V-pace, no st elevations  Assessment/Plan Syncope     - admit to obs, tele     - check orthostatics     - continue fluids     - check echo     - UA, labs ok, Hgb stable  COVID     - completed course of molnupiravir outpt     - not vaccinated     - sats are ok on RA; slight wheeze with good spasm when coughing     - remdes 3 day course, inhalers, antitussives, follow inflammatory markers  Hx of a fib Hx of HTN Hx of pacer placement     - he's  brady and hypotensive     - hold atenolol, olmesartan     - continue coumadin, pharm to dose  Dementia     - ?offical dx     - he's on namenda and donepezil; continue  DM2     - diet controlled; carb modified diet, SSI  DVT prophylaxis: lovenox  Code Status: FULL  Family Communication: w/ dtr at bedside  Consults called: None  Status is: Observation  The patient remains OBS appropriate and will d/c before 2 midnights.  Dispo: The patient is from: Home              Anticipated d/c is to: Home              Anticipated d/c date is: 1 day              Patient currently is not medically stable to d/c.   Difficult to place patient No  Jonnie Finner DO [facility]  If 7PM-7AM, please contact night-coverage [email or web address]  10/25/2020, 3:29 PM

## 2020-10-25 NOTE — Progress Notes (Signed)
[facility] for warfarin Indication: hx atrial fibrillation  Allergies  Allergen Reactions  . Codeine Rash and Other (See Comments)    Dizziness, low blood pressure and heart rate  . Hydrocodone-Acetaminophen Rash and Other (See Comments)    Dizziness, low blood pressure and heart rate  . Tramadol Nausea And Vomiting  . Lipitor [Atorvastatin] Other (See Comments)    Myalgias   . Robaxin [Methocarbamol] Other (See Comments)    Dropped blood pressure too much  . Zocor [Simvastatin] Other (See Comments)    myalgias    Patient Measurements: Height: 5\' 5"  (165.1 cm) Weight: 69.9 kg (154 lb) IBW/kg (Calculated) : 61.5 Heparin Dosing Weight:   Vital Signs: Temp: 98.7 F (37.1 C) (02/19 1700) Temp Source: Oral (02/19 1700) BP: 121/53 (02/19 1730) Pulse Rate: 60 (02/19 1730)  Labs: Recent Labs    10/23/20 1550 10/25/20 1110 10/25/20 1315  HGB 13.7 12.3*  --   HCT 40.9 36.2*  --   PLT 124* 115*  --   LABPROT 23.4*  --   --   INR 2.2*  --   --   CREATININE 1.17 1.14  --   TROPONINIHS  --  20* 20*    Estimated Creatinine Clearance: 45 mL/min (by C-G formula based on SCr of 1.14 mg/dL).   Medications:  - PTA warfarin regimen: 5mg  daily except 2.5mg  on Wed (last dose taken on 2/18)  Assessment: Patient is an 80 y.o M with COVID-19 (tested positive on 2/7) and hx afib on warfarin PTA presented to the ED on 2/19 with c/o hypotension and syncopal episode. Pharmacy has been consulted to resume warfarin on admission.  Today, 10/25/2020: - INR is therapeutic at 2.6 - hgb 12.3, plts 115K  Goal of Therapy:  INR 2-3 Monitor platelets by anticoagulation protocol: Yes   Plan:  - warfarin 5mg  PO x1 today - daily INR - monitor for s/sx bleeding  Lynelle Doctor 10/25/2020,6:08 PM

## 2020-10-25 NOTE — ED Triage Notes (Signed)
BIBA Per EMS: Pt coming from home with complaints of hypotension 58/30 initially. Pt tested pos for covid 2/7.  5106ml NS given by EMS  95% RA 16RR 106/70 now  60HR A&Ox4 18 L FA

## 2020-10-25 NOTE — ED Provider Notes (Signed)
[facility] DEPT Provider Note   CSN: [id number] Arrival date & time: 10/25/20  1017     History Chief Complaint  Patient presents with  . Hypotension  . Covid Positive    Tanner Hensley is a 80 y.o. male.  The history is provided by the patient and medical records. No language interpreter was used.  Abdominal Pain Pain location:  Generalized Pain quality: aching and cramping   Pain radiates to:  Does not radiate Pain severity:  Moderate Onset quality:  Gradual Duration:  1 day Timing:  Constant Progression:  Worsening Chronicity:  New Context: not trauma   Relieved by:  Nothing Worsened by:  Nothing Ineffective treatments:  None tried Associated symptoms: chest pain, chills, constipation, cough, fatigue and fever   Associated symptoms: no diarrhea, no dysuria, no nausea and no shortness of breath        Past Medical History:  Diagnosis Date  . Arthritis    sees Dr. Charlestine Night  . Atrial fibrillation ([place])    (Dr. Phylliss Blakes at [facility] and Dr. Lovena Le at [facility]. [facility] polymyalgia rheumatica (Dr. Charlestine Night)  . CHF (congestive heart failure) (HCC)    NOT SURE WHEN  . Coronary artery disease   . Diabetes mellitus type II    DIET CONTROLLED AND LOST 40 LBS  . Dysrhythmia    chronic a=fib with cardiac ablations  . GERD (gastroesophageal reflux disease)   . Gout   . High cholesterol   . Hypertension   . Insomnia   . Memory loss   . Neuromuscular disorder ([place])    POLY MYALGIA   RHEUMATICA  . Polymyalgia rheumatica ([place])    sees Dr. Charlestine Night   . Presence of permanent cardiac pacemaker    [employer]  08/2011  . Urticaria     Patient Active Problem List   Diagnosis Date Noted  . COVID-19 virus infection 10/15/2020  . Arthritis 02/13/2019  . Pulmonary infiltrates on CXR 06/21/2018  . Upper airway cough syndrome 06/20/2018  . Type 2 diabetes mellitus with other specified complication ([place])  44/96/7591  . Lower extremity weakness   . Radiculopathy 10/13/2016  . Syncope 10/13/2016  . CAD (coronary artery disease) 10/13/2016  . Memory loss 02/22/2014  . 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 KNEE SPRAIN 11/10/2009  . INGUINAL HERNIA 09/17/2009  . CHEST WALL PAIN, ACUTE 09/17/2009  . Dyslipidemia 09/13/2008  . OVERACTIVE BLADDER 01/26/2008  . 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 Essential hypertension 05/22/2007  . GERD 05/22/2007  . ATRIAL FIBRILLATION, CHRONIC 05/01/2007    Past Surgical History:  Procedure Laterality Date  . ATRIAL FIBRILLATION ABLATION  2006, 2008, 2013   at [facility]  by Dr. Ola Spurr  . BACK SURGERY    . CARDIAC CATHETERIZATION     has had multiple, but last one 2017 (10/13/2016)  . COLONOSCOPY W/ BIOPSIES AND POLYPECTOMY  04/05/2017   per Dr. Oretha Caprice, no polyps, no repeats needed   . CORONARY ANGIOPLASTY WITH STENT PLACEMENT  ~ 2014   "he has 2 stents" (10/13/2016)  . ESOPHAGOGASTRODUODENOSCOPY    . INGUINAL HERNIA REPAIR Right 11/28/2009   per Dr. Donnie Mesa  . KNEE ARTHROSCOPY Left 02/03/2010   per Dr. Lindwood Qua  . POSTERIOR LUMBAR FUSION  10/14/2016  . SPINE SURGERY  10/13/2016   per Dr. Lynann Bologna, L3-4 fusion  . TONSILLECTOMY         Family History  Problem  Relation Age of Onset  . Coronary artery disease Other   . Hypertension Other   . Heart disease Other   . Heart disease Mother   . Heart attack Father   . Heart disease Sister   . Diabetes Sister   . Hypertension Sister   . Other Brother        brain tumor  . Hypertension Brother   . Heart disease Brother     Social History   Tobacco Use  . Smoking status: Never Smoker  . Smokeless tobacco: Never Used  Vaping Use  . Vaping Use: Never used  Substance Use Topics  . Alcohol use: No    Alcohol/week: 0.0 standard drinks  . Drug use: No    Home Medications Prior to Admission medications   Medication Sig  Start Date End Date Taking? Authorizing Provider  acetaminophen (TYLENOL) 500 MG tablet Take 1,000 mg by mouth at bedtime as needed.     [provider]  alendronate (FOSAMAX) 70 MG tablet TAKE 1 TABLET BY MOUTH  WEEKLY WITH 8 OUNCE OF  PLAIN WATER 1/2 HOUR BEFORE FIRST FOOD DRINK OR MEDS.  STAY UPRIGHT FOR 1/2 HOUR 03/07/20   Laurey Morale, MD  ascorbic acid (VITAMIN C) 500 MG tablet  10/31/19   [provider]  aspirin EC 81 MG tablet Take 81 mg by mouth daily.    [provider]  atenolol (TENORMIN) 25 MG tablet Take 25 mg by mouth daily. 09/14/17   [provider]  b complex vitamins tablet Take 1 tablet by mouth daily.    [provider]  benzonatate (TESSALON) 100 MG capsule Take 1 capsule (100 mg total) by mouth every 6 (six) hours as needed for cough. 10/24/20   Laurey Morale, MD  Biotin 1000 MCG tablet Take 5,000 mcg by mouth daily.     [provider]  calcium citrate-vitamin D (CITRACAL+D) 315-200 MG-UNIT tablet Take 1 tablet by mouth 2 (two) times daily.    [provider]  donepezil (ARICEPT) 10 MG tablet TAKE 1 TABLET BY MOUTH AT  BEDTIME 12/31/19   Laurey Morale, MD  memantine (NAMENDA) 10 MG tablet TAKE 1 TABLET BY MOUTH  TWICE DAILY 08/05/20   Laurey Morale, MD  Molnupiravir 200 MG CAPS Take 4 capsules (800 mg total) by mouth 2 (two) times daily. 10/15/20   Laurey Morale, MD  Multiple Vitamin (MULTIVITAMIN WITH MINERALS) TABS tablet Take 1 tablet by mouth daily. SENIOR MULTIVITAMIN    [provider]  olmesartan (BENICAR) 20 MG tablet TAKE 1 TABLET BY MOUTH  DAILY 07/22/20   Laurey Morale, MD  ondansetron (ZOFRAN ODT) 4 MG disintegrating tablet 4mg  ODT q4 hours prn nausea/vomit 10/23/20   Deno Etienne, DO  oxybutynin (DITROPAN-XL) 5 MG 24 hr tablet Take 1 tablet (5 mg total) by mouth at bedtime. 07/01/20   Laurey Morale, MD  predniSONE (DELTASONE) 10 MG tablet Take 10 mg by mouth 2 (two) times daily. 09/23/20    [provider]  warfarin (COUMADIN) 5 MG tablet TAKE 1 TABLET BY MOUTH  DAILY EXCEPT 1.5 TABLETS ON [facility] AND SATURDAY OR  AS DIRECTED BY  ANTICOAGULATION CLINIC 07/28/20   Laurey Morale, MD    Allergies    Codeine, Hydrocodone-acetaminophen, Tramadol, Lipitor [atorvastatin], Robaxin [methocarbamol], and Zocor [simvastatin]  Review of Systems   Review of Systems  Constitutional: Positive for chills, fatigue and fever. Negative for diaphoresis.  HENT: Positive for congestion. Negative  for rhinorrhea.   Eyes: Negative for visual disturbance.  Respiratory: Positive for cough. Negative for choking, chest tightness, shortness of breath and wheezing.   Cardiovascular: Positive for chest pain. Negative for palpitations and leg swelling.  Gastrointestinal: Positive for abdominal pain and constipation. Negative for diarrhea and nausea.  Genitourinary: Negative for dysuria, flank pain and frequency.  Musculoskeletal: Negative for back pain, neck pain and neck stiffness.  Skin: Negative for rash and wound.  Neurological: Negative for syncope (near syncope), light-headedness, numbness and headaches.  Psychiatric/Behavioral: Negative for agitation and confusion.  All other systems reviewed and are negative.   Physical Exam Updated Vital Signs BP 109/66 (BP Location: Right Arm)   Pulse 70   Temp 98.6 F (37 C) (Oral)   Resp 13   Ht 5\' 5"  (1.651 m)   Wt 69.9 kg   SpO2 97%   BMI 25.63 kg/m   Physical Exam Vitals and nursing note reviewed.  Constitutional:      General: He is not in acute distress.    Appearance: He is well-developed and well-nourished. He is ill-appearing. He is not toxic-appearing or diaphoretic.  HENT:     Head: Normocephalic and atraumatic.     Nose: Congestion present.     Mouth/Throat:     Mouth: Mucous membranes are moist.     Pharynx: No oropharyngeal exudate or posterior oropharyngeal erythema.  Eyes:     Conjunctiva/sclera: Conjunctivae normal.      Pupils: Pupils are equal, round, and reactive to light.  Cardiovascular:     Rate and Rhythm: Normal rate and regular rhythm.     Heart sounds: No murmur heard.   Pulmonary:     Effort: Pulmonary effort is normal. No respiratory distress.     Breath sounds: Rhonchi present. No wheezing or rales.  Chest:     Chest wall: No tenderness.  Abdominal:     General: Abdomen is flat. There is no distension.     Palpations: Abdomen is soft.     Tenderness: There is generalized abdominal tenderness. There is no right CVA tenderness, left CVA tenderness, guarding or rebound.  Musculoskeletal:        General: No tenderness or edema.     Cervical back: Neck supple. No tenderness.  Skin:    General: Skin is warm and dry.     Capillary Refill: Capillary refill takes less than 2 seconds.     Findings: No erythema or rash.  Neurological:     General: No focal deficit present.     Mental Status: He is alert.     Sensory: No sensory deficit.     Motor: No weakness.  Psychiatric:        Mood and Affect: Mood and affect normal.     ED Results / Procedures / Treatments   Labs (all labs ordered are listed, but only abnormal results are displayed) Labs Reviewed  CBC WITH DIFFERENTIAL/PLATELET - Abnormal; Notable for the following components:      Result Value   RBC 3.46 (*)    Hemoglobin 12.3 (*)    HCT 36.2 (*)    MCV 104.6 (*)    MCH 35.5 (*)    Platelets 115 (*)    Lymphs Abs 0.4 (*)    All other components within normal limits  COMPREHENSIVE METABOLIC PANEL - Abnormal; Notable for the following components:   Total Protein 5.8 (*)    All other components within normal limits  URINALYSIS, ROUTINE W REFLEX MICROSCOPIC -  Abnormal; Notable for the following components:   Specific Gravity, Urine 1.043 (*)    Ketones, ur 5 (*)    All other components within normal limits  TROPONIN I (HIGH SENSITIVITY) - Abnormal; Notable for the following components:   Troponin I (High Sensitivity) 20  (*)    All other components within normal limits  TROPONIN I (HIGH SENSITIVITY) - Abnormal; Notable for the following components:   Troponin I (High Sensitivity) 20 (*)    All other components within normal limits  CULTURE, BLOOD (ROUTINE X 2)  CULTURE, BLOOD (ROUTINE X 2)  URINE CULTURE  LACTIC ACID, PLASMA  LACTIC ACID, PLASMA  LIPASE, BLOOD    EKG EKG Interpretation  Date/Time:  Saturday October 25 2020 10:34:08 EST Ventricular Rate:  61 PR Interval:    QRS Duration: 132 QT Interval:  427 QTC Calculation: 431 R Axis:   -89 Text Interpretation: Ventricular-paced rhythm No further analysis attempted due to paced rhythm Baseline wander in lead(s) V6 similar to prior. No STEMI Confirmed by Antony Blackbird [phone]) on 10/25/2020 3:44:33 PM    Radiology DG Chest Port 1 View  Result Date: 10/23/2020 CLINICAL DATA:  Shortness of breath.  COVID-19 positive EXAM: PORTABLE CHEST 1 VIEW COMPARISON:  June 20, 2018; November 30, 2018 FINDINGS: There is slight bibasilar atelectasis. Lungs elsewhere clear. Heart size and pulmonary vascularity are normal. Pacemaker leads attached to right atrium and right ventricle. No adenopathy. There is aortic atherosclerosis. There is degenerative change in each shoulder. IMPRESSION: Mild bibasilar atelectasis. Lungs elsewhere clear. Stable cardiac silhouette. Pacemaker leads attached to right atrium and right ventricle. Aortic Atherosclerosis (ICD10-I70.0). Electronically Signed   By: Lowella Grip III M.D.   On: 10/23/2020 15:59   CT Angio Chest/Abd/Pel for Dissection W and/or Wo Contrast  Result Date: 10/25/2020 CLINICAL DATA:  Abdominal pain. Sharp chest discomfort. Hypertension. COVID-19 positive patient. EXAM: CT ANGIOGRAPHY CHEST, ABDOMEN AND PELVIS TECHNIQUE: Non-contrast CT of the chest was initially obtained. Multidetector CT imaging through the chest, abdomen and pelvis was performed using the standard protocol during bolus administration of  intravenous contrast. Multiplanar reconstructed images and MIPs were obtained and reviewed to evaluate the vascular anatomy. CONTRAST:  166mL OMNIPAQUE IOHEXOL 350 MG/ML SOLN COMPARISON:  CT pulmonary angiogram October 14, 2016 FINDINGS: CTA CHEST FINDINGS Cardiovascular: Calcified atherosclerosis is seen in the thoracic aorta. The ascending thoracic aorta measures 4.1 cm near the root of the aorta. The distal thoracic aortic arch measures up to 3.9 cm on series 6, image 24, unchanged since February of 2018. The mid descending thoracic aorta measures 3.6 cm. The distal descending thoracic aorta is tortuous. No dissection. Three-vessel calcified atherosclerosis seen in the coronary arteries. The heart is unremarkable. The central pulmonary arteries are normal in caliber. Evaluation for pulmonary emboli is limited due to timing of contrast on this study. Within these limitations, no emboli are identified. Mediastinum/Nodes: Small pleural effusions are seen. The thyroid is normal in appearance. Shotty nodes in the mediastinum are unchanged since 2018, of no acute significance. The esophagus is normal. No pericardial effusion. Lungs/Pleura: Mild patchy ground-glass opacities are seen in the lungs bilaterally, involving all lobes, and most prominent peripherally. No suspicious nodules or masses. Central airways are normal. No pneumothorax. Musculoskeletal: No chest wall abnormality. No acute or significant osseous findings. Review of the MIP images confirms the above findings. CTA ABDOMEN AND PELVIS FINDINGS VASCULAR Aorta: The abdominal aorta is tortuous with atherosclerotic changes. No aneurysm or dissection. Celiac: Patent without evidence of aneurysm, dissection,  vasculitis or significant stenosis. SMA: Calcified atherosclerosis near the origin of the SMA without severe stenosis. Renals: Atherosclerosis is seen in the origin of the renal arteries bilaterally without critical stenosis. IMA: Patent without evidence of  aneurysm, dissection, vasculitis or significant stenosis. Inflow: Patent without evidence of aneurysm, dissection, vasculitis or significant stenosis. Veins: No obvious venous abnormality within the limitations of this arterial phase study. Review of the MIP images confirms the above findings. NON-VASCULAR Hepatobiliary: No focal liver abnormality is seen. No gallstones, gallbladder wall thickening, or biliary dilatation. Pancreas: Unremarkable. No pancreatic ductal dilatation or surrounding inflammatory changes. Spleen: Normal in size without focal abnormality. Adrenals/Urinary Tract: Adrenal glands are normal. Evaluation for renal stones is difficult given arterial phase of imaging. However, there appear to be nonobstructive stones in the lower poles of both kidneys. No suspicious masses. No hydronephrosis or perinephric stranding. No ureteral stones or bladder abnormalities identified. Stomach/Bowel: The stomach and small bowel are normal. Scattered colonic diverticulosis is seen without diverticulitis. The colon is otherwise normal. The appendix is normal. Lymphatic: No lymphadenopathy identified. Reproductive: Uterus and bilateral adnexa are unremarkable. Other: Fat containing inguinal hernia is identified. A small fat containing umbilical hernia is noted. No free air free fluid. Musculoskeletal: Pedicle rods and screws are seen at L3 and L4. Degenerative changes are seen in the thoracolumbar spine. Review of the MIP images confirms the above findings. IMPRESSION: 1. The proximal ascending thoracic aorta measures 4.1 cm. The distal thoracic aortic arch measures 3.9 cm. No dissection. Recommend annual imaging followup by CTA or MRA. This recommendation follows 2010 ACCF/AHA/AATS/ACR/ASA/SCA/SCAI/SIR/STS/SVM Guidelines for the Diagnosis and Management of Patients with Thoracic Aortic Disease. Circulation.2010; 121: V893-Y101. Aortic aneurysm NOS (ICD10-I71.9) 2. Patchy ground-glass opacities in the peripheries of  the lungs bilaterally, likely COVID-19 pneumonia given history. Recommend short-term follow-up CT imaging to ensure resolution. 3. Atherosclerosis in the thoracic and abdominal aorta. Atherosclerosis in the coronary arteries. 4. Nonobstructive stones in the lower poles of the kidneys. 5. Colonic diverticulosis without diverticulitis. 6. Bilateral fat containing inguinal hernias. Small fat containing umbilical hernia. 7. Degenerative changes in the spine. Electronically Signed   By: Dorise Bullion III M.D   On: 10/25/2020 12:52    Procedures Procedures    Medications Ordered in ED Medications  sodium chloride 0.9 % bolus 500 mL (0 mLs Intravenous Stopped 10/25/20 1415)  iohexol (OMNIPAQUE) 350 MG/ML injection 100 mL (100 mLs Intravenous Contrast Given 10/25/20 1225)    ED Course  I have reviewed the triage vital signs and the nursing notes.  Pertinent labs & imaging results that were available during my care of the patient were reviewed by me and considered in my medical decision making (see chart for details).    MDM Rules/Calculators/A&P                          Tanner Hensley is a 80 y.o. male with diagnosis of COVID-19 last week with a history of hypertension, dyslipidemia, atrial fibrillation, GERD, CAD, polymyalgia rheumatica, CHF, and gout who presents with hypotension, abdominal pain rating into chest, near syncope, and fatigue.  According to patient, was diagnosed with COVID-19 a week ago and was seen several days ago for a syncopal episode.  Patient reports has been trying to drink more fluids and was doing better but today started having worsening discomfort.  Reports he is having pain in across his abdomen including going into his lower chest.  He reports it is moderate, a  7 out of 10 in severity.  He reports no nausea, vomiting, or diarrhea but does report some constipation.  He reports he is passing some gas but has not had a bowel movement since several days ago.  He denies any  new trauma.  Denies any headache or neck pain or neck stiffness.  Does report feeling fatigued.  EMS reported to nursing that his blood pressure was in the 93Y systolic and they gave him 500 of fluids.  He is denying shortness of breath at this time but is feeling very lightheaded.  He denies other complaints including no dysuria or urinary changes.  On exam, lungs had some coarseness and he was coughing.  Chest was nontender to my exam but abdomen was diffusely tender.  Bowel sounds were appreciated.  Flank and back nontender.  He denied pain radiating straight through to his back.  Pulses present in extremities.  Was warm to the touch although his rectal temp was 100.1.  His blood pressure was around 101 systolic, initial evaluation and improved from the 50 systolic when EMS saw him.  Had a shared decision made conversation with patient.  Given his history in the chart of a dilated aorta, his severe abdominal pain rating into his chest, I do feel CT imaging is indicated to look for obstruction, aortic abnormality, or other acute cause of his discomfort.  We also will look for other infection contributing to the hypotension give him a small amount of fluids.  Anticipate reassessment to determine disposition but given his lightheadedness, near syncope, and blood pressure in the 50s before arrival, anticipate he will require admission.  2:57 PM Work-up is begun to return and surprisingly reassuring thus far.  Patient does not have evidence of AKI or leukocytosis.  Lactic acid normal x2.  Troponin is slightly elevated at 20 but stable x2.  CT scan did not show evidence of aortic dissection and slight dilation appears similar.  Does show some opacities likely consistent with his Covid.  No pneumothorax or other concerning finding acutely.  He was able to finally urinate for [place] and a urine was sent.  After some fluids, his blood pressure has remained over 100 in the emergency department.  He is still feeling  fatigued and got lightheaded when he stood up to urinate.  I had a conversation with the daughter who has arrived at the bedside.  She reports that over the last 3 days he had now 2 episodes of syncope and near syncope with blood pressures down into the 75Z and 02H systolic respectively.  She reports that they discussed admission during his previous visit for syncope but allowed him to go home with good return precautions.  As he had this new near syncopal episode with pressure in the 50s, they do not feel safe with him going home at this time despite the reassuring work-up thus far.    We will give some more fluids and admit for monitoring of near syncope and hypotension.  We will still wait for the urinalysis as well.    Final Clinical Impression(s) / ED Diagnoses Final diagnoses:  Near syncope  Lightheaded  COVID     Clinical Impression: 1. Near syncope   2. Lightheaded   3. COVID     Disposition: Admit  This note was prepared with assistance of Dragon voice recognition software. Occasional wrong-word or sound-a-like substitutions may have occurred due to the inherent limitations of voice recognition software.     Xion Debruyne, Gwenyth Allegra, MD  10/25/20 1544  

## 2020-10-25 NOTE — Progress Notes (Signed)
PHARMACIST - PHYSICIAN ORDER COMMUNICATION  CONCERNING: P&T Medication Policy on Herbal Medications  DESCRIPTION:  This patient's order for:  Biotin  has been noted.  This product(s) is classified as an "herbal" or natural product. Due to a lack of definitive safety studies or FDA approval, nonstandard manufacturing practices, plus the potential risk of unknown drug-drug interactions while on inpatient medications, the Pharmacy and Therapeutics Committee does not permit the use of "herbal" or natural products of this type within [facility].   ACTION TAKEN: The pharmacy department is unable to verify this order at this time and your patient has been informed of this safety policy. Please reevaluate patient's clinical condition at discharge and address if the herbal or natural product(s) should be resumed at that time.  Dia Sitter, PharmD, BCPS 10/25/2020 7:06 PM

## 2020-10-26 ENCOUNTER — Observation Stay (HOSPITAL_BASED_OUTPATIENT_CLINIC_OR_DEPARTMENT_OTHER): Payer: Medicare Other

## 2020-10-26 DIAGNOSIS — D539 Nutritional anemia, unspecified: Secondary | ICD-10-CM

## 2020-10-26 DIAGNOSIS — I340 Nonrheumatic mitral (valve) insufficiency: Secondary | ICD-10-CM | POA: Diagnosis not present

## 2020-10-26 DIAGNOSIS — I361 Nonrheumatic tricuspid (valve) insufficiency: Secondary | ICD-10-CM | POA: Diagnosis not present

## 2020-10-26 DIAGNOSIS — R55 Syncope and collapse: Secondary | ICD-10-CM

## 2020-10-26 DIAGNOSIS — U071 COVID-19: Secondary | ICD-10-CM | POA: Diagnosis not present

## 2020-10-26 DIAGNOSIS — I351 Nonrheumatic aortic (valve) insufficiency: Secondary | ICD-10-CM

## 2020-10-26 DIAGNOSIS — I4811 Longstanding persistent atrial fibrillation: Secondary | ICD-10-CM | POA: Diagnosis not present

## 2020-10-26 LAB — CBC WITH DIFFERENTIAL/PLATELET
Abs Immature Granulocytes: 0.02 10*3/uL (ref 0.00–0.07)
Basophils Absolute: 0 10*3/uL (ref 0.0–0.1)
Basophils Relative: 0 %
Eosinophils Absolute: 0 10*3/uL (ref 0.0–0.5)
Eosinophils Relative: 1 %
HCT: 33.6 % — ABNORMAL LOW (ref 39.0–52.0)
Hemoglobin: 11.3 g/dL — ABNORMAL LOW (ref 13.0–17.0)
Immature Granulocytes: 1 %
Lymphocytes Relative: 16 %
Lymphs Abs: 0.6 10*3/uL — ABNORMAL LOW (ref 0.7–4.0)
MCH: 35.5 pg — ABNORMAL HIGH (ref 26.0–34.0)
MCHC: 33.6 g/dL (ref 30.0–36.0)
MCV: 105.7 fL — ABNORMAL HIGH (ref 80.0–100.0)
Monocytes Absolute: 0.5 10*3/uL (ref 0.1–1.0)
Monocytes Relative: 15 %
Neutro Abs: 2.4 10*3/uL (ref 1.7–7.7)
Neutrophils Relative %: 67 %
Platelets: 97 10*3/uL — ABNORMAL LOW (ref 150–400)
RBC: 3.18 MIL/uL — ABNORMAL LOW (ref 4.22–5.81)
RDW: 12 % (ref 11.5–15.5)
WBC: 3.5 10*3/uL — ABNORMAL LOW (ref 4.0–10.5)
nRBC: 0 % (ref 0.0–0.2)

## 2020-10-26 LAB — COMPREHENSIVE METABOLIC PANEL
ALT: 15 U/L (ref 0–44)
AST: 24 U/L (ref 15–41)
Albumin: 3.1 g/dL — ABNORMAL LOW (ref 3.5–5.0)
Alkaline Phosphatase: 44 U/L (ref 38–126)
Anion gap: 7 (ref 5–15)
BUN: 16 mg/dL (ref 8–23)
CO2: 27 mmol/L (ref 22–32)
Calcium: 8.8 mg/dL — ABNORMAL LOW (ref 8.9–10.3)
Chloride: 104 mmol/L (ref 98–111)
Creatinine, Ser: 1.09 mg/dL (ref 0.61–1.24)
GFR, Estimated: >60 mL/min (ref >60)
Glucose, Bld: 73 mg/dL (ref 70–99)
Potassium: 4.4 mmol/L (ref 3.5–5.1)
Sodium: 138 mmol/L (ref 135–145)
Total Bilirubin: 0.8 mg/dL (ref 0.3–1.2)
Total Protein: 5.1 g/dL — ABNORMAL LOW (ref 6.5–8.1)

## 2020-10-26 LAB — ECHOCARDIOGRAM COMPLETE
Height: 65 in
S' Lateral: 3.43 cm
Weight: 2730.18 oz

## 2020-10-26 LAB — VITAMIN B12: Vitamin B-12: 341 pg/mL (ref 180–914)

## 2020-10-26 LAB — PROTIME-INR
INR: 2.7 — ABNORMAL HIGH (ref 0.8–1.2)
Prothrombin Time: 28.2 seconds — ABNORMAL HIGH (ref 11.4–15.2)

## 2020-10-26 LAB — GLUCOSE, CAPILLARY: Glucose-Capillary: 129 mg/dL — ABNORMAL HIGH (ref 70–99)

## 2020-10-26 LAB — D-DIMER, QUANTITATIVE: D-Dimer, Quant: 0.46 ug/mL-FEU (ref 0.00–0.50)

## 2020-10-26 LAB — PHOSPHORUS: Phosphorus: 2.6 mg/dL (ref 2.5–4.6)

## 2020-10-26 LAB — FOLATE: Folate: 37.9 ng/mL (ref >5.9)

## 2020-10-26 LAB — MAGNESIUM: Magnesium: 1.6 mg/dL — ABNORMAL LOW (ref 1.7–2.4)

## 2020-10-26 LAB — C-REACTIVE PROTEIN: CRP: 2.8 mg/dL — ABNORMAL HIGH (ref <1.0)

## 2020-10-26 LAB — FERRITIN: Ferritin: 222 ng/mL (ref 24–336)

## 2020-10-26 MED ORDER — ACETAMINOPHEN 325 MG PO TABS
650.0000 mg | ORAL_TABLET | ORAL | Status: DC | PRN
  Administered 2020-10-26 – 2020-10-27 (×6): 650 mg via ORAL
  Filled 2020-10-26 (×6): qty 2

## 2020-10-26 MED ORDER — WARFARIN SODIUM 5 MG PO TABS
5.0000 mg | ORAL_TABLET | Freq: Once | ORAL | Status: AC
  Administered 2020-10-26: 5 mg via ORAL
  Filled 2020-10-26: qty 1

## 2020-10-26 MED ORDER — MAGNESIUM OXIDE 400 (241.3 MG) MG PO TABS
800.0000 mg | ORAL_TABLET | Freq: Once | ORAL | Status: AC
  Administered 2020-10-26: 800 mg via ORAL
  Filled 2020-10-26: qty 2

## 2020-10-26 NOTE — Assessment & Plan Note (Signed)
-   diagnosis 10/14/20 - remains on RA; some sxms of cough ongoing as well as weakness and decreased appetite - finish 3 day course remdesivir (end date 2/21). Also is s/p molnupiravir after diagnosis

## 2020-10-26 NOTE — Assessment & Plan Note (Addendum)
-  Etiology considered volume depletion from poor oral intake in setting of ongoing antihypertensive medications -Home meds have been held for now; can resume as BP further improves -Follow-up echo: EF 60-65%, Gr III DD -Orthostatic vitals checked on 10/25/2020 were negative after fluid resuscitation

## 2020-10-26 NOTE — Assessment & Plan Note (Signed)
-  Hold antihypertensives for now

## 2020-10-26 NOTE — Progress Notes (Signed)
PROGRESS NOTE    Tanner Hensley   EVO:[id number]  DOB: August 15, 1941  DOA: 10/25/2020     0  PCP: Laurey Morale, MD  CC: weakness, dizziness, poor appetite   Hospital Course: Mr. [place]field is an 80 yo male with PMH HTN, DMII, CAD, PAF (on Coumadin), CHF who presented to the ER feeling very weak, faint, short of breath, and was found to be significantly hypotensive.  He tested positive for COVID-19 on 10/14/2020.  His wife was also positive and I have been at home in quarantine recovering slowly.  He states that his appetite has been extremely poor and he has been low on energy and not doing much other than resting.  He had continued to take all of his home medications despite poor appetite with decreased intake.  Upon developing his worsening weakness and dizziness, he presented to the ER. As noted, he was found to be hypotensive on admission and was started on IV fluids.  His blood pressure improved, home medications were held, and gradually his dizziness improved as well.   Interval History:  Seen this morning laying in bed flat on back.  Still endorses some ongoing dizziness if getting up too fast and still feels rather weak.  Endorses ongoing poor appetite for over a week but states he did start to feel a little more hungry this morning compared to usual.  Did not feel comfortable going home today due to the ongoing dizziness and weakness.  We discussed continuing fluids through today, awaiting echo results, and tentative plan for discharging home tomorrow.  ROS: Constitutional: positive for anorexia, fatigue and malaise, negative for chills and fevers, Respiratory: positive for cough and dyspnea on exertion, Cardiovascular: negative and Gastrointestinal: negative for abdominal pain  Assessment & Plan: * Syncope, near -Etiology considered volume depletion from poor oral intake in setting of ongoing antihypertensive medications -Home meds have been held for now -Continue fluids; appetite  still poor -Follow-up echo -Orthostatic vitals checked on 10/25/2020 were negative after fluid resuscitation  COVID-19 virus infection - diagnosis 10/14/20 - remains on RA; some sxms of cough ongoing as well as weakness and decreased appetite - finish 3 day course remdesivir (end date 2/21). Also is s/p molnupiravir after diagnosis   Macrocytic anemia -Baseline hemoglobin 12 to 13 g/dL -Mild drop, possibly some hemodilution. -Continue trending CBC while hospitalized -Check folate and B12  Type 2 diabetes mellitus with other specified complication (HCC) - diet controlled   CAD (coronary artery disease) - holding anti-hypertensives   ATRIAL FIBRILLATION, CHRONIC - continue coumadin per pharmacy - hold atenolol  Essential hypertension -Hold antihypertensives for now    Old records reviewed in assessment of this patient  Antimicrobials: n/a  DVT prophylaxis:  warfarin (COUMADIN) tablet 5 mg   Code Status:   Code Status: Full Code Family Communication:   Disposition Plan: Status is: Observation  The patient remains OBS appropriate and will d/c before 2 midnights.  Dispo: The patient is from: Home              Anticipated d/c is to: Home              Anticipated d/c date is: Monday              Patient currently is not medically stable to d/c.   Difficult to place patient No  Risk of unplanned readmission score:     Objective: Blood pressure 113/62, pulse 66, temperature 99.5 F (37.5 C), temperature source Oral, resp.  rate 18, height 5\' 5"  (1.651 m), weight 77.4 kg, SpO2 98 %.  Examination: General appearance: alert, cooperative and no distress Head: Normocephalic, without obvious abnormality, atraumatic Eyes: EOMI Lungs: clear to auscultation bilaterally Heart: regular rate and rhythm, S1, S2 normal and 3/6 HSM noted in USBs Abdomen: normal findings: bowel sounds normal and soft, non-tender Extremities: no edema Skin: mobility and turgor normal Neurologic:  Grossly normal  Consultants:   n/a  Procedures:   n/a  Data Reviewed: I have personally reviewed following labs and imaging studies Results for orders placed or performed during the hospital encounter of 10/25/20 (from the past 24 hour(s))  Urinalysis, Routine w reflex microscopic Urine, Clean Catch     Status: Abnormal   Collection Time: 10/25/20  2:59 PM  Result Value Ref Range   Color, Urine YELLOW YELLOW   APPearance CLEAR CLEAR   Specific Gravity, Urine 1.043 (H) 1.005 - 1.030   pH 5.0 5.0 - 8.0   Glucose, UA NEGATIVE NEGATIVE mg/dL   Hgb urine dipstick NEGATIVE NEGATIVE   Bilirubin Urine NEGATIVE NEGATIVE   Ketones, ur 5 (A) NEGATIVE mg/dL   Protein, ur NEGATIVE NEGATIVE mg/dL   Nitrite NEGATIVE NEGATIVE   Leukocytes,Ua NEGATIVE NEGATIVE  CBC with Differential/Platelet     Status: Abnormal   Collection Time: 10/26/20  4:34 AM  Result Value Ref Range   WBC 3.5 (L) 4.0 - 10.5 K/uL   RBC 3.18 (L) 4.22 - 5.81 MIL/uL   Hemoglobin 11.3 (L) 13.0 - 17.0 g/dL   HCT 33.6 (L) 39.0 - 52.0 %   MCV 105.7 (H) 80.0 - 100.0 fL   MCH 35.5 (H) 26.0 - 34.0 pg   MCHC 33.6 30.0 - 36.0 g/dL   RDW 12.0 11.5 - 15.5 %   Platelets 97 (L) 150 - 400 K/uL   nRBC 0.0 0.0 - 0.2 %   Neutrophils Relative % 67 %   Neutro Abs 2.4 1.7 - 7.7 K/uL   Lymphocytes Relative 16 %   Lymphs Abs 0.6 (L) 0.7 - 4.0 K/uL   Monocytes Relative 15 %   Monocytes Absolute 0.5 0.1 - 1.0 K/uL   Eosinophils Relative 1 %   Eosinophils Absolute 0.0 0.0 - 0.5 K/uL   Basophils Relative 0 %   Basophils Absolute 0.0 0.0 - 0.1 K/uL   Immature Granulocytes 1 %   Abs Immature Granulocytes 0.02 0.00 - 0.07 K/uL  Comprehensive metabolic panel     Status: Abnormal   Collection Time: 10/26/20  4:34 AM  Result Value Ref Range   Sodium 138 135 - 145 mmol/L   Potassium 4.4 3.5 - 5.1 mmol/L   Chloride 104 98 - 111 mmol/L   CO2 27 22 - 32 mmol/L   Glucose, Bld 73 70 - 99 mg/dL   BUN 16 8 - 23 mg/dL   Creatinine, Ser 1.09 0.61  - 1.24 mg/dL   Calcium 8.8 (L) 8.9 - 10.3 mg/dL   Total Protein 5.1 (L) 6.5 - 8.1 g/dL   Albumin 3.1 (L) 3.5 - 5.0 g/dL   AST 24 15 - 41 U/L   ALT 15 0 - 44 U/L   Alkaline Phosphatase 44 38 - 126 U/L   Total Bilirubin 0.8 0.3 - 1.2 mg/dL   GFR, Estimated >60 >60 mL/min   Anion gap 7 5 - 15  C-reactive protein     Status: Abnormal   Collection Time: 10/26/20  4:34 AM  Result Value Ref Range   CRP 2.8 (H) <1.0  mg/dL  D-dimer, quantitative     Status: None   Collection Time: 10/26/20  4:34 AM  Result Value Ref Range   D-Dimer, Quant 0.46 0.00 - 0.50 ug/mL-FEU  Ferritin     Status: None   Collection Time: 10/26/20  4:34 AM  Result Value Ref Range   Ferritin 222 24 - 336 ng/mL  Magnesium     Status: Abnormal   Collection Time: 10/26/20  4:34 AM  Result Value Ref Range   Magnesium 1.6 (L) 1.7 - 2.4 mg/dL  Phosphorus     Status: None   Collection Time: 10/26/20  4:34 AM  Result Value Ref Range   Phosphorus 2.6 2.5 - 4.6 mg/dL  Protime-INR     Status: Abnormal   Collection Time: 10/26/20  4:34 AM  Result Value Ref Range   Prothrombin Time 28.2 (H) 11.4 - 15.2 seconds   INR 2.7 (H) 0.8 - 1.2  Glucose, capillary     Status: Abnormal   Collection Time: 10/26/20  6:41 AM  Result Value Ref Range   Glucose-Capillary 129 (H) 70 - 99 mg/dL    Recent Results (from the past 240 hour(s))  Blood culture (routine x 2)     Status: None (Preliminary result)   Collection Time: 10/25/20 11:10 AM   Specimen: BLOOD  Result Value Ref Range Status   Specimen Description   Final    BLOOD UNKNOWN Performed at [facility], [place] [address] [place], [place] [address]    Special Requests   Final    BOTTLES DRAWN AEROBIC AND ANAEROBIC Blood Culture adequate volume Performed at [facility] [address], [place], [place] [address]    Culture   Final    NO GROWTH < 24 HOURS Performed at [place] [facility] [address] [place], [place] [address]     Report Status PENDING  Incomplete     Radiology Studies: CT Angio Chest/Abd/Pel for Dissection W and/or Wo Contrast  Result Date: 10/25/2020 CLINICAL DATA:  Abdominal pain. Sharp chest discomfort. Hypertension. COVID-19 positive patient. EXAM: CT ANGIOGRAPHY CHEST, ABDOMEN AND PELVIS TECHNIQUE: Non-contrast CT of the chest was initially obtained. Multidetector CT imaging through the chest, abdomen and pelvis was performed using the standard protocol during bolus administration of intravenous contrast. Multiplanar reconstructed images and MIPs were obtained and reviewed to evaluate the vascular anatomy. CONTRAST:  150mL OMNIPAQUE IOHEXOL 350 MG/ML SOLN COMPARISON:  CT pulmonary angiogram October 14, 2016 FINDINGS: CTA CHEST FINDINGS Cardiovascular: Calcified atherosclerosis is seen in the thoracic aorta. The ascending thoracic aorta measures 4.1 cm near the root of the aorta. The distal thoracic aortic arch measures up to 3.9 cm on series 6, image 24, unchanged since February of 2018. The mid descending thoracic aorta measures 3.6 cm. The distal descending thoracic aorta is tortuous. No dissection. Three-vessel calcified atherosclerosis seen in the coronary arteries. The heart is unremarkable. The central pulmonary arteries are normal in caliber. Evaluation for pulmonary emboli is limited due to timing of contrast on this study. Within these limitations, no emboli are identified. Mediastinum/Nodes: Small pleural effusions are seen. The thyroid is normal in appearance. Shotty nodes in the mediastinum are unchanged since 2018, of no acute significance. The esophagus is normal. No pericardial effusion. Lungs/Pleura: Mild patchy ground-glass opacities are seen in the lungs bilaterally, involving all lobes, and most prominent peripherally. No suspicious nodules or masses. Central airways are normal. No pneumothorax. Musculoskeletal: No chest wall abnormality. No acute or significant osseous findings.  Review of the  MIP images confirms the above findings. CTA ABDOMEN AND PELVIS FINDINGS VASCULAR Aorta: The abdominal aorta is tortuous with atherosclerotic changes. No aneurysm or dissection. Celiac: Patent without evidence of aneurysm, dissection, vasculitis or significant stenosis. SMA: Calcified atherosclerosis near the origin of the SMA without severe stenosis. Renals: Atherosclerosis is seen in the origin of the renal arteries bilaterally without critical stenosis. IMA: Patent without evidence of aneurysm, dissection, vasculitis or significant stenosis. Inflow: Patent without evidence of aneurysm, dissection, vasculitis or significant stenosis. Veins: No obvious venous abnormality within the limitations of this arterial phase study. Review of the MIP images confirms the above findings. NON-VASCULAR Hepatobiliary: No focal liver abnormality is seen. No gallstones, gallbladder wall thickening, or biliary dilatation. Pancreas: Unremarkable. No pancreatic ductal dilatation or surrounding inflammatory changes. Spleen: Normal in size without focal abnormality. Adrenals/Urinary Tract: Adrenal glands are normal. Evaluation for renal stones is difficult given arterial phase of imaging. However, there appear to be nonobstructive stones in the lower poles of both kidneys. No suspicious masses. No hydronephrosis or perinephric stranding. No ureteral stones or bladder abnormalities identified. Stomach/Bowel: The stomach and small bowel are normal. Scattered colonic diverticulosis is seen without diverticulitis. The colon is otherwise normal. The appendix is normal. Lymphatic: No lymphadenopathy identified. Reproductive: Uterus and bilateral adnexa are unremarkable. Other: Fat containing inguinal hernia is identified. A small fat containing umbilical hernia is noted. No free air free fluid. Musculoskeletal: Pedicle rods and screws are seen at L3 and L4. Degenerative changes are seen in the thoracolumbar spine. Review of the MIP images  confirms the above findings. IMPRESSION: 1. The proximal ascending thoracic aorta measures 4.1 cm. The distal thoracic aortic arch measures 3.9 cm. No dissection. Recommend annual imaging followup by CTA or MRA. This recommendation follows 2010 ACCF/AHA/AATS/ACR/ASA/SCA/SCAI/SIR/STS/SVM Guidelines for the Diagnosis and Management of Patients with Thoracic Aortic Disease. Circulation.2010; 121: U440-H474. Aortic aneurysm NOS (ICD10-I71.9) 2. Patchy ground-glass opacities in the peripheries of the lungs bilaterally, likely COVID-19 pneumonia given history. Recommend short-term follow-up CT imaging to ensure resolution. 3. Atherosclerosis in the thoracic and abdominal aorta. Atherosclerosis in the coronary arteries. 4. Nonobstructive stones in the lower poles of the kidneys. 5. Colonic diverticulosis without diverticulitis. 6. Bilateral fat containing inguinal hernias. Small fat containing umbilical hernia. 7. Degenerative changes in the spine. Electronically Signed   By: Dorise Bullion III M.D   On: 10/25/2020 12:52   CT Angio Chest/Abd/Pel for Dissection W and/or Wo Contrast  Final Result      Scheduled Meds: . ascorbic acid  500 mg Oral Daily  . aspirin EC  81 mg Oral Daily  . B-complex with vitamin C  1 tablet Oral Daily  . calcium citrate  200 mg of elemental calcium Oral BID   And  . cholecalciferol  200 Units Oral BID  . donepezil  10 mg Oral QHS  . Ipratropium-Albuterol  1 puff Inhalation Q6H  . memantine  10 mg Oral BID  . multivitamin with minerals  1 tablet Oral Daily  . sodium chloride flush  3 mL Intravenous Q12H  . warfarin  5 mg Oral ONCE-1600  . Warfarin - Pharmacist Dosing Inpatient   Does not apply q1600   PRN Meds: acetaminophen, guaiFENesin-dextromethorphan, ondansetron **OR** ondansetron (ZOFRAN) IV Continuous Infusions: . sodium chloride 75 mL/hr at 10/26/20 0356  . remdesivir 100 mg in NS 100 mL 100 mg (10/26/20 0923)     LOS: 0 days  Time spent: Greater than 50% of  the 35 minute visit  was spent in counseling/coordination of care for the patient as laid out in the A&P.   Dwyane Dee, MD [facility] 10/26/2020, 1:54 PM

## 2020-10-26 NOTE — Assessment & Plan Note (Signed)
-   diet controlled

## 2020-10-26 NOTE — Progress Notes (Signed)
Pt high fall risk continue to get out of bed and chair without assistance. Call bell placed within reach, bed and chair alarm and all necessary precautions implemented. Pt educated on safety and fall risk precaution no evidence of learning. Will continue to monitor closely.

## 2020-10-26 NOTE — Assessment & Plan Note (Signed)
-   holding anti-hypertensives

## 2020-10-26 NOTE — Hospital Course (Signed)
Tanner Hensley is an 80 yo male with PMH HTN, DMII, CAD, PAF (on Coumadin), CHF who presented to the ER feeling very weak, faint, short of breath, and was found to be significantly hypotensive.  He tested positive for COVID-19 on 10/14/2020.  His wife was also positive and I have been at home in quarantine recovering slowly.  He states that his appetite has been extremely poor and he has been low on energy and not doing much other than resting.  He had continued to take all of his home medications despite poor appetite with decreased intake.  Upon developing his worsening weakness and dizziness, he presented to the ER. As noted, he was found to be hypotensive on admission and was started on IV fluids.  His blood pressure improved, home medications were held, and gradually his dizziness improved as well.

## 2020-10-26 NOTE — Assessment & Plan Note (Signed)
-   continue coumadin per pharmacy - hold atenolol

## 2020-10-26 NOTE — Progress Notes (Signed)
  Echocardiogram 2D Echocardiogram has been performed.  Tanner Hensley 10/26/2020, 8:51 AM

## 2020-10-26 NOTE — Assessment & Plan Note (Signed)
-  Baseline hemoglobin 12 to 13 g/dL -Mild drop, possibly some hemodilution. -Continue trending CBC while hospitalized -Check folate and B12

## 2020-10-26 NOTE — Progress Notes (Signed)
[place] for warfarin Indication: hx atrial fibrillation  Allergies  Allergen Reactions  . Codeine Rash and Other (See Comments)    Dizziness, low blood pressure and heart rate  . Hydrocodone-Acetaminophen Rash and Other (See Comments)    Dizziness, low blood pressure and heart rate  . Tramadol Nausea And Vomiting  . Lipitor [Atorvastatin] Other (See Comments)    Myalgias   . Robaxin [Methocarbamol] Other (See Comments)    Dropped blood pressure too much  . Zocor [Simvastatin] Other (See Comments)    myalgias    Patient Measurements: Height: 5\' 5"  (165.1 cm) Weight: 77.4 kg (170 lb 10.2 oz) IBW/kg (Calculated) : 61.5 Heparin Dosing Weight:   Vital Signs: Temp: 99.5 F (37.5 C) (02/20 0619) Temp Source: Oral (02/20 0619) BP: 113/62 (02/20 0619) Pulse Rate: 66 (02/20 0619)  Labs: Recent Labs    10/23/20 1550 10/25/20 1110 10/25/20 1315 10/26/20 0434  HGB 13.7 12.3*  --  11.3*  HCT 40.9 36.2*  --  33.6*  PLT 124* 115*  --  97*  LABPROT 23.4* 26.8*  --  28.2*  INR 2.2* 2.6*  --  2.7*  CREATININE 1.17 1.14  --  1.09  TROPONINIHS  --  20* 20*  --     Estimated Creatinine Clearance: 51.9 mL/min (by C-G formula based on SCr of 1.09 mg/dL).   Medications:  - PTA warfarin regimen: 5mg  daily except 2.5mg  on Wed (last dose taken on 2/18)  Assessment: Patient is an 80 y.o M with COVID-19 (tested positive on 2/7) and hx afib on warfarin PTA, presented to the ED on 2/19 with c/o hypotension and syncopal episode. Warfarin resumed on admission.  Today, 10/26/2020: - INR remains therapeutic at 2.7 - hgb down 11.3, plts down 97K - no bleeding documented - drug-drug intxns: remdesivir may enhance the anticoagulant effect of Warfarin  Goal of Therapy:  INR 2-3 Monitor platelets by anticoagulation protocol: Yes   Plan:  - repeat warfarin 5mg  PO x1 today - daily INR - monitor for s/sx bleeding  Tanner Hensley P 10/26/2020,10:53  AM

## 2020-10-27 DIAGNOSIS — R55 Syncope and collapse: Secondary | ICD-10-CM | POA: Diagnosis not present

## 2020-10-27 LAB — URINE CULTURE: Culture: <10000 — AB

## 2020-10-27 LAB — COMPREHENSIVE METABOLIC PANEL
ALT: 21 U/L (ref 0–44)
AST: 51 U/L — ABNORMAL HIGH (ref 15–41)
Albumin: 3.2 g/dL — ABNORMAL LOW (ref 3.5–5.0)
Alkaline Phosphatase: 46 U/L (ref 38–126)
Anion gap: 8 (ref 5–15)
BUN: 15 mg/dL (ref 8–23)
CO2: 24 mmol/L (ref 22–32)
Calcium: 8.5 mg/dL — ABNORMAL LOW (ref 8.9–10.3)
Chloride: 106 mmol/L (ref 98–111)
Creatinine, Ser: 1.09 mg/dL (ref 0.61–1.24)
GFR, Estimated: >60 mL/min (ref >60)
Glucose, Bld: 91 mg/dL (ref 70–99)
Potassium: 4.3 mmol/L (ref 3.5–5.1)
Sodium: 138 mmol/L (ref 135–145)
Total Bilirubin: 0.7 mg/dL (ref 0.3–1.2)
Total Protein: 5.3 g/dL — ABNORMAL LOW (ref 6.5–8.1)

## 2020-10-27 LAB — CBC WITH DIFFERENTIAL/PLATELET
Abs Immature Granulocytes: 0.02 10*3/uL (ref 0.00–0.07)
Basophils Absolute: 0 10*3/uL (ref 0.0–0.1)
Basophils Relative: 0 %
Eosinophils Absolute: 0 10*3/uL (ref 0.0–0.5)
Eosinophils Relative: 0 %
HCT: 35.3 % — ABNORMAL LOW (ref 39.0–52.0)
Hemoglobin: 11.9 g/dL — ABNORMAL LOW (ref 13.0–17.0)
Immature Granulocytes: 1 %
Lymphocytes Relative: 17 %
Lymphs Abs: 0.6 10*3/uL — ABNORMAL LOW (ref 0.7–4.0)
MCH: 35.8 pg — ABNORMAL HIGH (ref 26.0–34.0)
MCHC: 33.7 g/dL (ref 30.0–36.0)
MCV: 106.3 fL — ABNORMAL HIGH (ref 80.0–100.0)
Monocytes Absolute: 0.5 10*3/uL (ref 0.1–1.0)
Monocytes Relative: 14 %
Neutro Abs: 2.5 10*3/uL (ref 1.7–7.7)
Neutrophils Relative %: 68 %
Platelets: 91 10*3/uL — ABNORMAL LOW (ref 150–400)
RBC: 3.32 MIL/uL — ABNORMAL LOW (ref 4.22–5.81)
RDW: 12 % (ref 11.5–15.5)
WBC: 3.7 10*3/uL — ABNORMAL LOW (ref 4.0–10.5)
nRBC: 0 % (ref 0.0–0.2)

## 2020-10-27 LAB — D-DIMER, QUANTITATIVE: D-Dimer, Quant: 0.47 ug/mL-FEU (ref 0.00–0.50)

## 2020-10-27 LAB — MAGNESIUM: Magnesium: 1.8 mg/dL (ref 1.7–2.4)

## 2020-10-27 LAB — C-REACTIVE PROTEIN: CRP: 5 mg/dL — ABNORMAL HIGH (ref <1.0)

## 2020-10-27 LAB — GLUCOSE, CAPILLARY: Glucose-Capillary: 74 mg/dL (ref 70–99)

## 2020-10-27 LAB — PROTIME-INR
INR: 3.1 — ABNORMAL HIGH (ref 0.8–1.2)
Prothrombin Time: 30.8 seconds — ABNORMAL HIGH (ref 11.4–15.2)

## 2020-10-27 LAB — PHOSPHORUS: Phosphorus: 2.2 mg/dL — ABNORMAL LOW (ref 2.5–4.6)

## 2020-10-27 LAB — FERRITIN: Ferritin: 257 ng/mL (ref 24–336)

## 2020-10-27 MED ORDER — MAGNESIUM OXIDE 400 (241.3 MG) MG PO TABS
800.0000 mg | ORAL_TABLET | Freq: Once | ORAL | Status: AC
  Administered 2020-10-27: 800 mg via ORAL
  Filled 2020-10-27: qty 2

## 2020-10-27 MED ORDER — WARFARIN SODIUM 2.5 MG PO TABS
2.5000 mg | ORAL_TABLET | Freq: Once | ORAL | Status: AC
  Administered 2020-10-27: 2.5 mg via ORAL
  Filled 2020-10-27: qty 1

## 2020-10-27 MED ORDER — LOPERAMIDE HCL 2 MG PO CAPS
2.0000 mg | ORAL_CAPSULE | ORAL | Status: DC | PRN
  Administered 2020-10-27: 2 mg via ORAL

## 2020-10-27 MED ORDER — POTASSIUM & SODIUM PHOSPHATES 280-160-250 MG PO PACK
2.0000 | PACK | Freq: Once | ORAL | Status: AC
  Administered 2020-10-27: 2 via ORAL
  Filled 2020-10-27: qty 2

## 2020-10-27 NOTE — Progress Notes (Signed)
[place] for warfarin Indication: hx atrial fibrillation  Allergies  Allergen Reactions  . Codeine Rash and Other (See Comments)    Dizziness, low blood pressure and heart rate  . Hydrocodone-Acetaminophen Rash and Other (See Comments)    Dizziness, low blood pressure and heart rate  . Tramadol Nausea And Vomiting  . Lipitor [Atorvastatin] Other (See Comments)    Myalgias   . Robaxin [Methocarbamol] Other (See Comments)    Dropped blood pressure too much  . Zocor [Simvastatin] Other (See Comments)    myalgias    Patient Measurements: Height: 5\' 5"  (165.1 cm) Weight: 75.2 kg (165 lb 12.6 oz) IBW/kg (Calculated) : 61.5  Vital Signs: Temp: 100.7 F (38.2 C) (02/21 0500) Temp Source: Oral (02/21 0500) BP: 124/64 (02/21 0500) Pulse Rate: 59 (02/21 0500)  Labs: Recent Labs    10/25/20 1110 10/25/20 1315 10/26/20 0434 10/27/20 0424  HGB 12.3*  --  11.3* 11.9*  HCT 36.2*  --  33.6* 35.3*  PLT 115*  --  97* 91*  LABPROT 26.8*  --  28.2* 30.8*  INR 2.6*  --  2.7* 3.1*  CREATININE 1.14  --  1.09 1.09  TROPONINIHS 20* 20*  --   --     Estimated Creatinine Clearance: 51.2 mL/min (by C-G formula based on SCr of 1.09 mg/dL).   Medications:  - PTA warfarin regimen: 5mg  daily except 2.5mg  on Wed (last dose taken on 2/18)  Assessment: Patient is an 80 y.o M with COVID-19 (tested positive on 2/7) and hx afib on warfarin PTA, presented to the ED on 2/19 with c/o hypotension and syncopal episode. Warfarin resumed on admission.  Today, 10/27/2020:  INR = 3.1 is slightly elevated above goal   CBC: Hgb (11.9) slightly low but stable; Plt (91) low but stable  Diet: Carb modified, 100% of meal charted yesterday  DDI: Remdesivir may enhance effects of warfarin. Last dose of remdesivir to be given today  Goal of Therapy:  INR 2-3 Monitor platelets by anticoagulation protocol: Yes   Plan:   Warfarin 2.5 mg PO once this evening  INR  with AM labs tomorrow  Lenis Noon, PharmD 10/27/2020,10:37 AM

## 2020-10-27 NOTE — Progress Notes (Signed)
PROGRESS NOTE    Tanner Hensley   GMW:[id number]  DOB: [birth year]  DOA: 10/25/2020     0  PCP: Laurey Morale, MD  CC: weakness, dizziness, poor appetite   Hospital Course: Tanner Hensley is an 80 yo male with PMH HTN, DMII, CAD, PAF (on Coumadin), CHF who presented to the ER feeling very weak, faint, short of breath, and was found to be significantly hypotensive.  He tested positive for COVID-19 on 10/14/2020.  His wife was also positive and I have been at home in quarantine recovering slowly.  He states that his appetite has been extremely poor and he has been low on energy and not doing much other than resting.  He had continued to take all of his home medications despite poor appetite with decreased intake.  Upon developing his worsening weakness and dizziness, he presented to the ER. As noted, he was found to be hypotensive on admission and was started on IV fluids.  His blood pressure improved, home medications were held, and gradually his dizziness improved as well.   Interval History:  Patient feeling okay today.  Did have some loose stools this morning. His wife has been at home trying to recover from Covid as well but slowly getting worse and family was going to take her to the ER today. PT is stating that patient needs 24/7 care at home which he does not have thus might need rehab which is obstacle to discharge at this time as he was tentatively going home today.  ROS: Constitutional: positive for anorexia, fatigue and malaise, negative for chills and fevers, Respiratory: positive for cough and dyspnea on exertion, Cardiovascular: negative and Gastrointestinal: negative for abdominal pain  Assessment & Plan: * Syncope, near-resolved as of 10/27/2020 -Etiology considered volume depletion from poor oral intake in setting of ongoing antihypertensive medications -Home meds have been held for now; can resume as BP further improves -Follow-up echo: EF 60-65%, Gr III DD -Orthostatic  vitals checked on 10/25/2020 were negative after fluid resuscitation  COVID-19 virus infection - diagnosis 10/14/20 - remains on RA; some sxms of cough ongoing as well as weakness and decreased appetite - finish 3 day course remdesivir (end date 2/21). Also is s/p molnupiravir after diagnosis   Macrocytic anemia -Baseline hemoglobin 12 to 13 g/dL -Mild drop, possibly some hemodilution. -Continue trending CBC while hospitalized -Check folate and B12  Type 2 diabetes mellitus with other specified complication (HCC) - diet controlled   CAD (coronary artery disease) - holding anti-hypertensives   ATRIAL FIBRILLATION, CHRONIC - continue coumadin per pharmacy - hold atenolol  Essential hypertension -Hold antihypertensives for now   Old records reviewed in assessment of this patient  Antimicrobials: n/a  DVT prophylaxis:    Code Status:   Code Status: Full Code Family Communication: Daughter via phone  Disposition Plan: Status is: Observation  The patient remains OBS appropriate and will d/c before 2 midnights.  Dispo: The patient is from: Home              Anticipated d/c is to: Home              Anticipated d/c date is: Tuesday if patient declines rehab              Patient currently is not medically stable to d/c.   Difficult to place patient No  Risk of unplanned readmission score:     Objective: Blood pressure (!) 152/73, pulse 61, temperature 98.2 F (36.8 C), temperature source  Oral, resp. rate 20, height 5\' 5"  (1.651 m), weight 75.2 kg, SpO2 98 %.  Examination: General appearance: alert, cooperative and no distress Head: Normocephalic, without obvious abnormality, atraumatic Eyes: EOMI Lungs: clear to auscultation bilaterally Heart: regular rate and rhythm, S1, S2 normal and 3/6 HSM noted in USBs Abdomen: normal findings: bowel sounds normal and soft, non-tender Extremities: no edema Skin: mobility and turgor normal Neurologic: Grossly  normal  Consultants:   n/a  Procedures:   n/a  Data Reviewed: I have personally reviewed following labs and imaging studies Results for orders placed or performed during the hospital encounter of 10/25/20 (from the past 24 hour(s))  CBC with Differential/Platelet     Status: Abnormal   Collection Time: 10/27/20  4:24 AM  Result Value Ref Range   WBC 3.7 (L) 4.0 - 10.5 K/uL   RBC 3.32 (L) 4.22 - 5.81 MIL/uL   Hemoglobin 11.9 (L) 13.0 - 17.0 g/dL   HCT 35.3 (L) 39.0 - 52.0 %   MCV 106.3 (H) 80.0 - 100.0 fL   MCH 35.8 (H) 26.0 - 34.0 pg   MCHC 33.7 30.0 - 36.0 g/dL   RDW 12.0 11.5 - 15.5 %   Platelets 91 (L) 150 - 400 K/uL   nRBC 0.0 0.0 - 0.2 %   Neutrophils Relative % 68 %   Neutro Abs 2.5 1.7 - 7.7 K/uL   Lymphocytes Relative 17 %   Lymphs Abs 0.6 (L) 0.7 - 4.0 K/uL   Monocytes Relative 14 %   Monocytes Absolute 0.5 0.1 - 1.0 K/uL   Eosinophils Relative 0 %   Eosinophils Absolute 0.0 0.0 - 0.5 K/uL   Basophils Relative 0 %   Basophils Absolute 0.0 0.0 - 0.1 K/uL   Immature Granulocytes 1 %   Abs Immature Granulocytes 0.02 0.00 - 0.07 K/uL  Comprehensive metabolic panel     Status: Abnormal   Collection Time: 10/27/20  4:24 AM  Result Value Ref Range   Sodium 138 135 - 145 mmol/L   Potassium 4.3 3.5 - 5.1 mmol/L   Chloride 106 98 - 111 mmol/L   CO2 24 22 - 32 mmol/L   Glucose, Bld 91 70 - 99 mg/dL   BUN 15 8 - 23 mg/dL   Creatinine, Ser 1.09 0.61 - 1.24 mg/dL   Calcium 8.5 (L) 8.9 - 10.3 mg/dL   Total Protein 5.3 (L) 6.5 - 8.1 g/dL   Albumin 3.2 (L) 3.5 - 5.0 g/dL   AST 51 (H) 15 - 41 U/L   ALT 21 0 - 44 U/L   Alkaline Phosphatase 46 38 - 126 U/L   Total Bilirubin 0.7 0.3 - 1.2 mg/dL   GFR, Estimated >60 >60 mL/min   Anion gap 8 5 - 15  C-reactive protein     Status: Abnormal   Collection Time: 10/27/20  4:24 AM  Result Value Ref Range   CRP 5.0 (H) <1.0 mg/dL  D-dimer, quantitative     Status: None   Collection Time: 10/27/20  4:24 AM  Result Value Ref  Range   D-Dimer, Quant 0.47 0.00 - 0.50 ug/mL-FEU  Ferritin     Status: None   Collection Time: 10/27/20  4:24 AM  Result Value Ref Range   Ferritin 257 24 - 336 ng/mL  Magnesium     Status: None   Collection Time: 10/27/20  4:24 AM  Result Value Ref Range   Magnesium 1.8 1.7 - 2.4 mg/dL  Phosphorus     Status: Abnormal  Collection Time: 10/27/20  4:24 AM  Result Value Ref Range   Phosphorus 2.2 (L) 2.5 - 4.6 mg/dL  Protime-INR     Status: Abnormal   Collection Time: 10/27/20  4:24 AM  Result Value Ref Range   Prothrombin Time 30.8 (H) 11.4 - 15.2 seconds   INR 3.1 (H) 0.8 - 1.2  Glucose, capillary     Status: None   Collection Time: 10/27/20  4:54 AM  Result Value Ref Range   Glucose-Capillary 74 70 - 99 mg/dL    Recent Results (from the past 240 hour(s))  Blood culture (routine x 2)     Status: None (Preliminary result)   Collection Time: 10/25/20 11:10 AM   Specimen: BLOOD  Result Value Ref Range Status   Specimen Description   Final    BLOOD UNKNOWN Performed at [facility] [address], [place], [place] [address]    Special Requests   Final    BOTTLES DRAWN AEROBIC AND ANAEROBIC Blood Culture adequate volume Performed at [facility] [address] [place], [place] [address]    Culture   Final    NO GROWTH 2 DAYS Performed at [facility] [address] [place], [place] [address]    Report Status PENDING  Incomplete  Urine culture     Status: Abnormal   Collection Time: 10/25/20  2:59 PM   Specimen: Urine, Clean Catch  Result Value Ref Range Status   Specimen Description   Final    URINE, CLEAN CATCH Performed at [facility], [place] [address] [place], [place] [address]    Special Requests   Final    NONE Performed at [facility], [place] [address] [place], [place] [address]    Culture (A)  Final    <10,000 COLONIES/mL INSIGNIFICANT GROWTH Performed at [facility] [address] [place],  [address]    Report Status 10/27/2020 FINAL  Final     Radiology Studies: ECHOCARDIOGRAM COMPLETE  Result Date: 10/26/2020    ECHOCARDIOGRAM REPORT   Patient Name:   Tanner Hensley Date of Exam: 10/26/2020 Medical Rec #:  [id number]       Height:       65.0 in Accession #:    [id number]      Weight:       170.6 lb Date of Birth:  1941-02-12      BSA:          1.849 m Patient Age:    38 years        BP:           113/62 mmHg Patient Gender: M               HR:           60 bpm. Exam Location:  Inpatient Procedure: 2D Echo, Color Doppler and Cardiac Doppler Indications:    R55 Syncope  History:        Patient has prior history of Echocardiogram examinations, most                 recent 10/15/2016. CHF, CAD, Arrythmias:Atrial Fibrillation; Risk                 Factors:Hypertension and Diabetes.  Sonographer:    Bernadene Person RDCS Referring Phys: [id number] [place]  1. Left ventricular ejection fraction, by estimation, is 60 to 65%. The left ventricle has normal function. The  left ventricle has no regional wall motion abnormalities. There is mild concentric left ventricular hypertrophy. Left ventricular diastolic parameters are consistent with Grade III diastolic dysfunction (restrictive). Elevated left atrial pressure.  2. Right ventricular systolic function is normal. The right ventricular size is normal. There is normal pulmonary artery systolic pressure. The estimated right ventricular systolic pressure is 85.4 mmHg.  3. Left atrial size was mildly dilated.  4. The mitral valve is normal in structure. Mild mitral valve regurgitation. No evidence of mitral stenosis.  5. The aortic valve is normal in structure. There is moderate calcification of the aortic valve. There is moderate thickening of the aortic valve. Aortic valve regurgitation is mild. Mild aortic valve stenosis.  6. 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   7. The inferior vena cava is normal in size with greater than 50% respiratory variability, suggesting right atrial pressure of 3 mmHg. FINDINGS  Left Ventricle: Left ventricular ejection fraction, by estimation, is 60 to 65%. The left ventricle has normal function. The left ventricle has no regional wall motion abnormalities. The left ventricular internal cavity size was normal in size. There is  mild concentric left ventricular hypertrophy. Left ventricular diastolic parameters are consistent with Grade III diastolic dysfunction (restrictive). Elevated left atrial pressure. Right Ventricle: The right ventricular size is normal. No increase in right ventricular wall thickness. Right ventricular systolic function is normal. There is normal pulmonary artery systolic pressure. The tricuspid regurgitant velocity is 2.74 m/s, and  with an assumed right atrial pressure of 3 mmHg, the estimated right ventricular systolic pressure is 62.7 mmHg. Left Atrium: Left atrial size was mildly dilated. Right Atrium: Right atrial size was normal in size. Pericardium: There is no evidence of pericardial effusion. Mitral Valve: The mitral valve is normal in structure. Mild mitral valve regurgitation. No evidence of mitral valve stenosis. Tricuspid Valve: The tricuspid valve is normal in structure. Tricuspid valve regurgitation is mild . No evidence of tricuspid stenosis. Aortic Valve: The aortic valve is normal in structure. There is moderate calcification of the aortic valve. There is moderate thickening of the aortic valve. Aortic valve regurgitation is mild. Mild aortic stenosis is present. Pulmonic Valve: The pulmonic valve was normal in structure. Pulmonic valve regurgitation is not visualized. No evidence of pulmonic stenosis. Aorta: The aortic root is normal in size and structure. 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 Venous: The inferior vena cava is normal in size with greater than 50%  respiratory variability, suggesting right atrial pressure of 3 mmHg. IAS/Shunts: No atrial level shunt detected by color flow Doppler.  LEFT VENTRICLE PLAX 2D LVIDd:         4.94 cm LVIDs:         3.43 cm LV PW:         1.20 cm LV IVS:        0.98 cm LVOT diam:     2.10 cm LV SV:         81 LV SV Index:   44 LVOT Area:     3.46 cm  RIGHT VENTRICLE RV S prime:     7.54 cm/s TAPSE (M-mode): 1.4 cm LEFT ATRIUM         Index LA diam:    3.50 cm 1.89 cm/m  AORTIC VALVE LVOT Vmax:   119.67 cm/s LVOT Vmean:  86.500 cm/s LVOT VTI:    0.233 m  AORTA Ao Root diam: 3.90 cm  Ao Asc diam:  4.40 cm TRICUSPID VALVE TR Peak grad:   30.0 mmHg TR Vmax:        274.00 cm/s  SHUNTS Systemic VTI:  0.23 m Systemic Diam: 2.10 cm Ena Dawley MD Electronically signed by Ena Dawley MD Signature Date/Time: 10/26/2020/2:08:50 PM    Final    CT Angio Chest/Abd/Pel for Dissection W and/or Wo Contrast  Final Result      Scheduled Meds: . ascorbic acid  500 mg Oral Daily  . aspirin EC  81 mg Oral Daily  . B-complex with vitamin C  1 tablet Oral Daily  . calcium citrate  200 mg of elemental calcium Oral BID   And  . cholecalciferol  200 Units Oral BID  . donepezil  10 mg Oral QHS  . Ipratropium-Albuterol  1 puff Inhalation Q6H  . memantine  10 mg Oral BID  . multivitamin with minerals  1 tablet Oral Daily  . sodium chloride flush  3 mL Intravenous Q12H  . Warfarin - Pharmacist Dosing Inpatient   Does not apply q1600   PRN Meds: acetaminophen, guaiFENesin-dextromethorphan, loperamide, ondansetron **OR** ondansetron (ZOFRAN) IV Continuous Infusions: . sodium chloride 75 mL/hr at 10/27/20 0356     LOS: 0 days  Time spent: Greater than 50% of the 35 minute visit was spent in counseling/coordination of care for the patient as laid out in the A&P.   Dwyane Dee, MD [facility] 10/27/2020, 5:11 PM

## 2020-10-27 NOTE — Evaluation (Signed)
Physical Therapy Evaluation Patient Details Name: Tanner Hensley MRN: [id number] DOB: Dec 24, 1940 Today's Date: 10/27/2020   History of Present Illness  80 yo male admitted with COVID, syncope. Hx of lumbar fusion  Clinical Impression  On eval, pt was Min guard-Min assist for mobility. He walked ~30'x 1 then another 15'x 1. Pt presents with general weakness, decreased activity, and impaired gait and balance. He is unsteady and at risk for falls when mobilizing. He reported some dizziness during ambulation. Discussed d/c plan-pt is planning to return home where he lives with his wife. Unfortunately, his wife is also sick. There is some concern for his safety and his ability to safely manage at home. Feel pt could benefit from at least another night's stay in the hospital if MD is agreeable. If pt discharges home today, recommend HHPT and 24 hour supervision/assist.    Follow Up Recommendations Home health PT;Supervision/Assistance - 24 hour    Equipment Recommendations  None recommended by PT    Recommendations for Other Services       Precautions / Restrictions Precautions Precautions: Fall Precaution Comments: dizziness Restrictions Weight Bearing Restrictions: No      Mobility  Bed Mobility Overal bed mobility: Modified Independent             General bed mobility comments: increased time and effort. pt relied on bedrail    Transfers Overall transfer level: Needs assistance   Transfers: Sit to/from Stand Sit to Stand: Min guard         General transfer comment: min guard for safety. unsteady.  Ambulation/Gait Ambulation/Gait assistance: Min assist;Min guard Gait Distance (Feet): 30 Feet (15'x1) Assistive device: None ("furniture walk") Gait Pattern/deviations: Step-through pattern;Decreased stride length     General Gait Details: Unsteady. Pt also c/o dizziness while ambulating. BP 167/89 after first walk. After seated break, pt walked another 15  feet  Stairs            Wheelchair Mobility    Modified Rankin (Stroke Patients Only)       Balance Overall balance assessment: Needs assistance           Standing balance-Leahy Scale: Fair                               Pertinent Vitals/Pain Pain Assessment: No/denies pain    Home Living Family/patient expects to be discharged to:: Private residence Living Arrangements: Spouse/significant other Available Help at Discharge: Family Type of Home: House Home Access: Level entry     Home Layout: One level        Prior Function Level of Independence: Independent               Hand Dominance        Extremity/Trunk Assessment   Upper Extremity Assessment Upper Extremity Assessment: Overall WFL for tasks assessed    Lower Extremity Assessment Lower Extremity Assessment: Generalized weakness    Cervical / Trunk Assessment Cervical / Trunk Assessment: Normal  Communication   Communication: No difficulties  Cognition Arousal/Alertness: Awake/alert Behavior During Therapy: WFL for tasks assessed/performed Overall Cognitive Status: Within Functional Limits for tasks assessed                                 General Comments: some memory deficits at baseline per pt      General Comments      Exercises  Assessment/Plan    PT Assessment Patient needs continued PT services  PT Problem List Decreased strength;Decreased mobility;Decreased activity tolerance;Decreased balance       PT Treatment Interventions DME instruction;Gait training;Therapeutic activities;Therapeutic exercise;Patient/family education;Balance training;Functional mobility training    PT Goals (Current goals can be found in the Care Plan section)  Acute Rehab PT Goals Patient Stated Goal: home soon PT Goal Formulation: With patient Time For Goal Achievement: 11/10/20 Potential to Achieve Goals: Good    Frequency Min 3X/week   Barriers to  discharge        Co-evaluation               AM-PAC PT "6 Clicks" Mobility  Outcome Measure Help needed turning from your back to your side while in a flat bed without using bedrails?: None Help needed moving from lying on your back to sitting on the side of a flat bed without using bedrails?: None Help needed moving to and from a bed to a chair (including a wheelchair)?: A Little Help needed standing up from a chair using your arms (e.g., wheelchair or bedside chair)?: A Little Help needed to walk in hospital room?: A Little Help needed climbing 3-5 steps with a railing? : A Lot 6 Click Score: 19    End of Session   Activity Tolerance: Patient tolerated treatment well Patient left: in chair;with call bell/phone within reach;with chair alarm set   PT Visit Diagnosis: Unsteadiness on feet (R26.81);Muscle weakness (generalized) (M62.81)    Time: 1410-1453 PT Time Calculation (min) (ACUTE ONLY): 43 min   Charges:   PT Evaluation $PT Eval Moderate Complexity: 1 Mod PT Treatments $Gait Training: 23-37 mins           Doreatha Massed, PT Acute Rehabilitation  Office: [phone] Pager: [phone]

## 2020-10-27 NOTE — TOC Progression Note (Signed)
Transition of Care [facility]) - Progression Note    Patient Details  Name: Tanner Hensley MRN: [id number] Date of Birth: Apr 08, 1941  [facility] of [facility] CM/SW Contact  Purcell Mouton, RN Phone Number: 10/27/2020, 5:09 PM  Clinical Narrative:     Pt decline going to SNF, however would like home health. Will need orders for HHRN/PT.        Expected Discharge Plan and Services           Expected Discharge Date: 10/27/20                                     Social Determinants of Health (SDOH) Interventions    Readmission Risk Interventions No flowsheet data found.

## 2020-10-27 NOTE — Discharge Instructions (Signed)
Hold oxybutynin for another 2-3 days until nutrition and hydration improve.  Okay to resume atenolol and olmesartan but if appetite is poor or not drinking well then check blood pressure first. If pressure is greater than 140/90 then okay to take medicine. If it is lower than 110/70 then skip dose

## 2020-10-28 DIAGNOSIS — U071 COVID-19: Secondary | ICD-10-CM

## 2020-10-28 DIAGNOSIS — R55 Syncope and collapse: Secondary | ICD-10-CM | POA: Diagnosis not present

## 2020-10-28 DIAGNOSIS — I10 Essential (primary) hypertension: Secondary | ICD-10-CM

## 2020-10-28 DIAGNOSIS — I4811 Longstanding persistent atrial fibrillation: Secondary | ICD-10-CM

## 2020-10-28 DIAGNOSIS — E1169 Type 2 diabetes mellitus with other specified complication: Secondary | ICD-10-CM | POA: Diagnosis not present

## 2020-10-28 DIAGNOSIS — D539 Nutritional anemia, unspecified: Secondary | ICD-10-CM

## 2020-10-28 LAB — COMPREHENSIVE METABOLIC PANEL
ALT: 24 U/L (ref 0–44)
AST: 64 U/L — ABNORMAL HIGH (ref 15–41)
Albumin: 3.2 g/dL — ABNORMAL LOW (ref 3.5–5.0)
Alkaline Phosphatase: 48 U/L (ref 38–126)
Anion gap: 7 (ref 5–15)
BUN: 15 mg/dL (ref 8–23)
CO2: 23 mmol/L (ref 22–32)
Calcium: 8.3 mg/dL — ABNORMAL LOW (ref 8.9–10.3)
Chloride: 108 mmol/L (ref 98–111)
Creatinine, Ser: 0.93 mg/dL (ref 0.61–1.24)
GFR, Estimated: >60 mL/min (ref >60)
Glucose, Bld: 107 mg/dL — ABNORMAL HIGH (ref 70–99)
Potassium: 3.9 mmol/L (ref 3.5–5.1)
Sodium: 138 mmol/L (ref 135–145)
Total Bilirubin: 0.8 mg/dL (ref 0.3–1.2)
Total Protein: 5.5 g/dL — ABNORMAL LOW (ref 6.5–8.1)

## 2020-10-28 LAB — CBC WITH DIFFERENTIAL/PLATELET
Abs Immature Granulocytes: 0.02 10*3/uL (ref 0.00–0.07)
Basophils Absolute: 0 10*3/uL (ref 0.0–0.1)
Basophils Relative: 0 %
Eosinophils Absolute: 0.1 10*3/uL (ref 0.0–0.5)
Eosinophils Relative: 1 %
HCT: 36.6 % — ABNORMAL LOW (ref 39.0–52.0)
Hemoglobin: 11.8 g/dL — ABNORMAL LOW (ref 13.0–17.0)
Immature Granulocytes: 1 %
Lymphocytes Relative: 15 %
Lymphs Abs: 0.6 10*3/uL — ABNORMAL LOW (ref 0.7–4.0)
MCH: 35 pg — ABNORMAL HIGH (ref 26.0–34.0)
MCHC: 32.2 g/dL (ref 30.0–36.0)
MCV: 108.6 fL — ABNORMAL HIGH (ref 80.0–100.0)
Monocytes Absolute: 0.4 10*3/uL (ref 0.1–1.0)
Monocytes Relative: 11 %
Neutro Abs: 2.6 10*3/uL (ref 1.7–7.7)
Neutrophils Relative %: 72 %
Platelets: 91 10*3/uL — ABNORMAL LOW (ref 150–400)
RBC: 3.37 MIL/uL — ABNORMAL LOW (ref 4.22–5.81)
RDW: 11.9 % (ref 11.5–15.5)
WBC: 3.7 10*3/uL — ABNORMAL LOW (ref 4.0–10.5)
nRBC: 0 % (ref 0.0–0.2)

## 2020-10-28 LAB — MAGNESIUM: Magnesium: 1.7 mg/dL (ref 1.7–2.4)

## 2020-10-28 LAB — C-REACTIVE PROTEIN: CRP: 9 mg/dL — ABNORMAL HIGH (ref <1.0)

## 2020-10-28 LAB — D-DIMER, QUANTITATIVE: D-Dimer, Quant: 0.49 ug/mL-FEU (ref 0.00–0.50)

## 2020-10-28 LAB — PROTIME-INR
INR: 2.9 — ABNORMAL HIGH (ref 0.8–1.2)
Prothrombin Time: 29.1 seconds — ABNORMAL HIGH (ref 11.4–15.2)

## 2020-10-28 LAB — FERRITIN: Ferritin: 284 ng/mL (ref 24–336)

## 2020-10-28 LAB — PHOSPHORUS: Phosphorus: 2 mg/dL — ABNORMAL LOW (ref 2.5–4.6)

## 2020-10-28 MED ORDER — DM-GUAIFENESIN ER 30-600 MG PO TB12: 1.0000 | ORAL_TABLET | Freq: Two times a day (BID) | ORAL | 0 refills | Status: DC

## 2020-10-28 MED ORDER — WARFARIN SODIUM 2.5 MG PO TABS
2.5000 mg | ORAL_TABLET | Freq: Once | ORAL | Status: AC
  Administered 2020-10-28: 2.5 mg via ORAL
  Filled 2020-10-28: qty 1

## 2020-10-28 NOTE — Discharge Summary (Signed)
Discharge Summary  Tanner Hensley:[id number] DOB: Dec 02, 1940  PCP: Laurey Morale, MD  Admit date: 10/25/2020 Discharge date: 10/28/2020  Time spent: 30 mins   Recommendations for Outpatient Follow-up:  1. PCP in 1 week  Discharge Diagnoses:  Active Hospital Problems   Diagnosis Date Noted  . Macrocytic anemia 10/26/2020  . COVID-19 virus infection 10/15/2020  . Type 2 diabetes mellitus with other specified complication ([place]) 44/09/270  . CAD (coronary artery disease) 10/13/2016  . Essential hypertension 05/22/2007  . ATRIAL FIBRILLATION, CHRONIC 05/01/2007    Resolved Hospital Problems   Diagnosis Date Noted Date Resolved  . Syncope, near 10/25/2020 10/27/2020    Discharge Condition: Stable  Diet recommendation: Heart healthy  Vitals:   10/28/20 0357 10/28/20 1409  BP: (!) 144/72 132/68  Pulse: 63 (!) 58  Resp:  16  Temp: 97.6 F (36.4 C) 99.4 F (37.4 C)  SpO2: 95% 97%    History of present illness:  Tanner Hensley is an 80 yo male with PMH HTN, DMII, CAD, PAF (on Coumadin), CHF who presented to the ER feeling very weak, faint, short of breath, and was found to be significantly hypotensive.  He tested positive for COVID-19 on 10/14/2020.  His wife was also positive and I have been at home in quarantine recovering slowly.  He states that his appetite has been extremely poor and he has been low on energy and not doing much other than resting.  He had continued to take all of his home medications despite poor appetite with decreased intake.  Upon developing his worsening weakness and dizziness, he presented to the ER. As noted, he was found to be hypotensive on admission and was started on IV fluids.  His blood pressure improved, home medications were held, and gradually his dizziness improved as well.    Today, pt still coughing, but denies any SOB, remained on RA saturating well above 90%, denies any chest pain, fever/chills.   Hospital Course:  Active  Problems:   Essential hypertension   ATRIAL FIBRILLATION, CHRONIC   CAD (coronary artery disease)   Type 2 diabetes mellitus with other specified complication ([place])   ZDGUY-40 virus infection   Macrocytic anemia  Syncope, near-resolved as of 10/27/2020 -Etiology considered volume depletion from poor oral intake in setting of ongoing antihypertensive medications -Follow-up echo: EF 60-65%, Gr III DD -Orthostatic vitals checked on 10/25/2020 were negative after fluid resuscitation  COVID-19 virus infection - diagnosis 10/14/20 - remains on RA; some sxms of cough ongoing as well as weakness and decreased appetite - finish 3 day course remdesivir (end date 2/21). Also is s/p molnupiravir after diagnosis  Follow up with PCP  Macrocytic anemia -Baseline hemoglobin 12 to 13 g/dL -Folate WNL, B12-341  Type 2 diabetes mellitus with other specified complication (HCC) - diet controlled   CAD (coronary artery disease) -chest pain free  ATRIAL FIBRILLATION, CHRONIC - continue coumadin per pharmacy, atenolol  Essential hypertension -Restart antihypertensives     Malnutrition Type:      Malnutrition Characteristics:      Nutrition Interventions:      Estimated body mass index is 27.92 kg/m as calculated from the following:   Height as of this encounter: 5\' 5"  (1.651 m).   Weight as of this encounter: 76.1 kg.    Procedures:  None  Consultations:  None   Discharge Exam: BP 132/68 (BP Location: Right Arm)   Pulse (!) 58   Temp 99.4 F (37.4 C) (Oral)   Resp 16  Ht 5\' 5"  (1.651 m)   Wt 76.1 kg   SpO2 97%   BMI 27.92 kg/m   General: NAD Cardiovascular: S1, S2 present Respiratory: CTAB  Discharge Instructions You were cared for by a hospitalist during your hospital stay. If you have any questions about your discharge medications or the care you received while you were in the hospital after you are discharged, you can call the unit and asked to speak  with the hospitalist on call if the hospitalist that took care of you is not available. Once you are discharged, your primary care physician will handle any further medical issues. Please note that NO REFILLS for any discharge medications will be authorized once you are discharged, as it is imperative that you return to your primary care physician (or establish a relationship with a primary care physician if you do not have one) for your aftercare needs so that they can reassess your need for medications and monitor your lab values.  Discharge Instructions    Diet - low sodium heart healthy   Complete by: As directed    Increase activity slowly   Complete by: As directed    Increase activity slowly   Complete by: As directed      Allergies as of 10/28/2020      Reactions   Codeine Rash, Other (See Comments)   Dizziness, low blood pressure and heart rate   Hydrocodone-acetaminophen Rash, Other (See Comments)   Dizziness, low blood pressure and heart rate   Tramadol Nausea And Vomiting   Lipitor [atorvastatin] Other (See Comments)   Myalgias   Robaxin [methocarbamol] Other (See Comments)   Dropped blood pressure too much   Zocor [simvastatin] Other (See Comments)   myalgias      Medication List    STOP taking these medications   Molnupiravir 200 MG Caps     TAKE these medications   acetaminophen 500 MG tablet Commonly known as: TYLENOL Take 1,000 mg by mouth every 4 (four) hours as needed for mild pain.   alendronate 70 MG tablet Commonly known as: FOSAMAX TAKE 1 TABLET BY MOUTH  WEEKLY WITH 8 OUNCE OF  PLAIN WATER 1/2 HOUR BEFORE FIRST FOOD DRINK OR MEDS.  STAY UPRIGHT FOR 1/2 HOUR What changed: See the new instructions.   ascorbic acid 500 MG tablet Commonly known as: VITAMIN C Take 500 mg by mouth daily.   aspirin EC 81 MG tablet Take 81 mg by mouth daily.   atenolol 25 MG tablet Commonly known as: TENORMIN Take 25 mg by mouth daily.   b complex vitamins  tablet Take 1 tablet by mouth daily.   benzonatate 100 MG capsule Commonly known as: TESSALON Take 1 capsule (100 mg total) by mouth every 6 (six) hours as needed for cough. What changed: when to take this   Biotin 1000 MCG tablet Take 5,000 mcg by mouth daily.   calcium citrate-vitamin D 315-200 MG-UNIT tablet Commonly known as: CITRACAL+D Take 1 tablet by mouth 2 (two) times daily.   dextromethorphan-guaiFENesin 30-600 MG 12hr tablet Commonly known as: MUCINEX DM Take 1 tablet by mouth 2 (two) times daily for 7 days.   donepezil 10 MG tablet Commonly known as: ARICEPT TAKE 1 TABLET BY MOUTH AT  BEDTIME   memantine 10 MG tablet Commonly known as: NAMENDA TAKE 1 TABLET BY MOUTH  TWICE DAILY   multivitamin with minerals Tabs tablet Take 1 tablet by mouth daily. SENIOR MULTIVITAMIN   olmesartan 20 MG tablet Commonly known  as: BENICAR TAKE 1 TABLET BY MOUTH  DAILY   ondansetron 4 MG disintegrating tablet Commonly known as: Zofran ODT 4mg  ODT q4 hours prn nausea/vomit What changed:   how much to take  how to take this  when to take this  reasons to take this  additional instructions   oxybutynin 5 MG 24 hr tablet Commonly known as: DITROPAN-XL Take 1 tablet (5 mg total) by mouth at bedtime.   warfarin 5 MG tablet Commonly known as: COUMADIN Take as directed. If you are unsure how to take this medication, talk to your nurse or doctor. Original instructions: TAKE 1 TABLET BY MOUTH  DAILY EXCEPT 1.5 TABLETS ON WEDNESDAY AND SATURDAY OR  AS DIRECTED BY  ANTICOAGULATION CLINIC What changed:   how much to take  how to take this  when to take this  additional instructions      Allergies  Allergen Reactions  . Codeine Rash and Other (See Comments)    Dizziness, low blood pressure and heart rate  . Hydrocodone-Acetaminophen Rash and Other (See Comments)    Dizziness, low blood pressure and heart rate  . Tramadol Nausea And Vomiting  . Lipitor  [Atorvastatin] Other (See Comments)    Myalgias   . Robaxin [Methocarbamol] Other (See Comments)    Dropped blood pressure too much  . Zocor [Simvastatin] Other (See Comments)    myalgias    Follow-up Information    [facility] Follow up.   Specialty: Home Health Services Why:   Contact information: [place] [address] [phone]        Laurey Morale, MD. Schedule an appointment as soon as possible for a visit in 1 week(s).   Specialty: Family Medicine Contact information: [place] [address] [phone]                The results of significant diagnostics from this hospitalization (including imaging, microbiology, ancillary and laboratory) are listed below for reference.    Significant Diagnostic Studies: DG Chest Port 1 View  Result Date: 10/23/2020 CLINICAL DATA:  Shortness of breath.  COVID-19 positive EXAM: PORTABLE CHEST 1 VIEW COMPARISON:  June 20, 2018; November 30, 2018 FINDINGS: There is slight bibasilar atelectasis. Lungs elsewhere clear. Heart size and pulmonary vascularity are normal. Pacemaker leads attached to right atrium and right ventricle. No adenopathy. There is aortic atherosclerosis. There is degenerative change in each shoulder. IMPRESSION: Mild bibasilar atelectasis. Lungs elsewhere clear. Stable cardiac silhouette. Pacemaker leads attached to right atrium and right ventricle. Aortic Atherosclerosis (ICD10-I70.0). Electronically Signed   By: Lowella Grip III M.D.   On: 10/23/2020 15:59   ECHOCARDIOGRAM COMPLETE  Result Date: 10/26/2020    ECHOCARDIOGRAM REPORT   Patient Name:   Tanner Hensley Date of Exam: 10/26/2020 Medical Rec #:  [id number]       Height:       65.0 in Accession #:    [id number]      Weight:       170.6 lb Date of Birth:  April 29, 1941      BSA:          1.849 m Patient Age:    80 years        BP:           113/62 mmHg Patient Gender: M               HR:           60  bpm. Exam Location:  Inpatient Procedure: 2D Echo, Color Doppler and Cardiac Doppler Indications:    R55 Syncope  History:        Patient has prior history of Echocardiogram examinations, most                 recent 10/15/2016. CHF, CAD, Arrythmias:Atrial Fibrillation; Risk                 Factors:Hypertension and Diabetes.  Sonographer:    Bernadene Person RDCS Referring Phys: [id number] [place]  1. Left ventricular ejection fraction, by estimation, is 60 to 65%. The left ventricle has normal function. The left ventricle has no regional wall motion abnormalities. There is mild concentric left ventricular hypertrophy. Left ventricular diastolic parameters are consistent with Grade III diastolic dysfunction (restrictive). Elevated left atrial pressure.  2. Right ventricular systolic function is normal. The right ventricular size is normal. There is normal pulmonary artery systolic pressure. The estimated right ventricular systolic pressure is 35.3 mmHg.  3. Left atrial size was mildly dilated.  4. The mitral valve is normal in structure. Mild mitral valve regurgitation. No evidence of mitral stenosis.  5. The aortic valve is normal in structure. There is moderate calcification of the aortic valve. There is moderate thickening of the aortic valve. Aortic valve regurgitation is mild. Mild aortic valve stenosis.  6. 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  7. The inferior vena cava is normal in size with greater than 50% respiratory variability, suggesting right atrial pressure of 3 mmHg. FINDINGS  Left Ventricle: Left ventricular ejection fraction, by estimation, is 60 to 65%. The left ventricle has normal function. The left ventricle has no regional wall motion abnormalities. The left ventricular internal cavity size was normal in size. There is  mild concentric left ventricular hypertrophy. Left ventricular diastolic parameters are consistent with Grade III diastolic  dysfunction (restrictive). Elevated left atrial pressure. Right Ventricle: The right ventricular size is normal. No increase in right ventricular wall thickness. Right ventricular systolic function is normal. There is normal pulmonary artery systolic pressure. The tricuspid regurgitant velocity is 2.74 m/s, and  with an assumed right atrial pressure of 3 mmHg, the estimated right ventricular systolic pressure is 61.4 mmHg. Left Atrium: Left atrial size was mildly dilated. Right Atrium: Right atrial size was normal in size. Pericardium: There is no evidence of pericardial effusion. Mitral Valve: The mitral valve is normal in structure. Mild mitral valve regurgitation. No evidence of mitral valve stenosis. Tricuspid Valve: The tricuspid valve is normal in structure. Tricuspid valve regurgitation is mild . No evidence of tricuspid stenosis. Aortic Valve: The aortic valve is normal in structure. There is moderate calcification of the aortic valve. There is moderate thickening of the aortic valve. Aortic valve regurgitation is mild. Mild aortic stenosis is present. Pulmonic Valve: The pulmonic valve was normal in structure. Pulmonic valve regurgitation is not visualized. No evidence of pulmonic stenosis. Aorta: The aortic root is normal in size and structure. 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 Venous: The inferior vena cava is normal in size with greater than 50% respiratory variability, suggesting right atrial pressure of 3 mmHg. IAS/Shunts: No atrial level shunt detected by color flow Doppler.  LEFT VENTRICLE PLAX 2D LVIDd:         4.94 cm LVIDs:         3.43 cm LV PW:  1.20 cm LV IVS:        0.98 cm LVOT diam:     2.10 cm LV SV:         81 LV SV Index:   44 LVOT Area:     3.46 cm  RIGHT VENTRICLE RV S prime:     7.54 cm/s TAPSE (M-mode): 1.4 cm LEFT ATRIUM         Index LA diam:    3.50 cm 1.89 cm/m  AORTIC VALVE LVOT Vmax:   119.67 cm/s LVOT Vmean:  86.500 cm/s LVOT  VTI:    0.233 m  AORTA Ao Root diam: 3.90 cm Ao Asc diam:  4.40 cm TRICUSPID VALVE TR Peak grad:   30.0 mmHg TR Vmax:        274.00 cm/s  SHUNTS Systemic VTI:  0.23 m Systemic Diam: 2.10 cm Ena Dawley MD Electronically signed by Ena Dawley MD Signature Date/Time: 10/26/2020/2:08:50 PM    Final    CT Angio Chest/Abd/Pel for Dissection W and/or Wo Contrast  Result Date: 10/25/2020 CLINICAL DATA:  Abdominal pain. Sharp chest discomfort. Hypertension. COVID-19 positive patient. EXAM: CT ANGIOGRAPHY CHEST, ABDOMEN AND PELVIS TECHNIQUE: Non-contrast CT of the chest was initially obtained. Multidetector CT imaging through the chest, abdomen and pelvis was performed using the standard protocol during bolus administration of intravenous contrast. Multiplanar reconstructed images and MIPs were obtained and reviewed to evaluate the vascular anatomy. CONTRAST:  184mL OMNIPAQUE IOHEXOL 350 MG/ML SOLN COMPARISON:  CT pulmonary angiogram October 14, 2016 FINDINGS: CTA CHEST FINDINGS Cardiovascular: Calcified atherosclerosis is seen in the thoracic aorta. The ascending thoracic aorta measures 4.1 cm near the root of the aorta. The distal thoracic aortic arch measures up to 3.9 cm on series 6, image 24, unchanged since February of 2018. The mid descending thoracic aorta measures 3.6 cm. The distal descending thoracic aorta is tortuous. No dissection. Three-vessel calcified atherosclerosis seen in the coronary arteries. The heart is unremarkable. The central pulmonary arteries are normal in caliber. Evaluation for pulmonary emboli is limited due to timing of contrast on this study. Within these limitations, no emboli are identified. Mediastinum/Nodes: Small pleural effusions are seen. The thyroid is normal in appearance. Shotty nodes in the mediastinum are unchanged since 2018, of no acute significance. The esophagus is normal. No pericardial effusion. Lungs/Pleura: Mild patchy ground-glass opacities are seen in the  lungs bilaterally, involving all lobes, and most prominent peripherally. No suspicious nodules or masses. Central airways are normal. No pneumothorax. Musculoskeletal: No chest wall abnormality. No acute or significant osseous findings. Review of the MIP images confirms the above findings. CTA ABDOMEN AND PELVIS FINDINGS VASCULAR Aorta: The abdominal aorta is tortuous with atherosclerotic changes. No aneurysm or dissection. Celiac: Patent without evidence of aneurysm, dissection, vasculitis or significant stenosis. SMA: Calcified atherosclerosis near the origin of the SMA without severe stenosis. Renals: Atherosclerosis is seen in the origin of the renal arteries bilaterally without critical stenosis. IMA: Patent without evidence of aneurysm, dissection, vasculitis or significant stenosis. Inflow: Patent without evidence of aneurysm, dissection, vasculitis or significant stenosis. Veins: No obvious venous abnormality within the limitations of this arterial phase study. Review of the MIP images confirms the above findings. NON-VASCULAR Hepatobiliary: No focal liver abnormality is seen. No gallstones, gallbladder wall thickening, or biliary dilatation. Pancreas: Unremarkable. No pancreatic ductal dilatation or surrounding inflammatory changes. Spleen: Normal in size without focal abnormality. Adrenals/Urinary Tract: Adrenal glands are normal. Evaluation for renal stones is difficult given arterial phase of imaging. However, there appear  to be nonobstructive stones in the lower poles of both kidneys. No suspicious masses. No hydronephrosis or perinephric stranding. No ureteral stones or bladder abnormalities identified. Stomach/Bowel: The stomach and small bowel are normal. Scattered colonic diverticulosis is seen without diverticulitis. The colon is otherwise normal. The appendix is normal. Lymphatic: No lymphadenopathy identified. Reproductive: Uterus and bilateral adnexa are unremarkable. Other: Fat containing  inguinal hernia is identified. A small fat containing umbilical hernia is noted. No free air free fluid. Musculoskeletal: Pedicle rods and screws are seen at L3 and L4. Degenerative changes are seen in the thoracolumbar spine. Review of the MIP images confirms the above findings. IMPRESSION: 1. The proximal ascending thoracic aorta measures 4.1 cm. The distal thoracic aortic arch measures 3.9 cm. No dissection. Recommend annual imaging followup by CTA or MRA. This recommendation follows 2010 ACCF/AHA/AATS/ACR/ASA/SCA/SCAI/SIR/STS/SVM Guidelines for the Diagnosis and Management of Patients with Thoracic Aortic Disease. Circulation.2010; 121: C944-H675. Aortic aneurysm NOS (ICD10-I71.9) 2. Patchy ground-glass opacities in the peripheries of the lungs bilaterally, likely COVID-19 pneumonia given history. Recommend short-term follow-up CT imaging to ensure resolution. 3. Atherosclerosis in the thoracic and abdominal aorta. Atherosclerosis in the coronary arteries. 4. Nonobstructive stones in the lower poles of the kidneys. 5. Colonic diverticulosis without diverticulitis. 6. Bilateral fat containing inguinal hernias. Small fat containing umbilical hernia. 7. Degenerative changes in the spine. Electronically Signed   By: Dorise Bullion III M.D   On: 10/25/2020 12:52    Microbiology: Recent Results (from the past 240 hour(s))  Blood culture (routine x 2)     Status: None (Preliminary result)   Collection Time: 10/25/20 11:10 AM   Specimen: BLOOD  Result Value Ref Range Status   Specimen Description   Final    BLOOD UNKNOWN Performed at [facility] [address], [place], [place] [address]    Special Requests   Final    BOTTLES DRAWN AEROBIC AND ANAEROBIC Blood Culture adequate volume Performed at [facility] [address] [place], [place] [address]    Culture   Final    NO GROWTH 3 DAYS Performed at [facility], [place] [address] [place], [place]  [address]    Report Status PENDING  Incomplete  Urine culture     Status: Abnormal   Collection Time: 10/25/20  2:59 PM   Specimen: Urine, Clean Catch  Result Value Ref Range Status   Specimen Description   Final    URINE, CLEAN CATCH Performed at [facility], [place] [address] [place], [place] [address]    Special Requests   Final    NONE Performed at [facility], [place] [address], [place], [place] [address]    Culture (A)  Final    <10,000 COLONIES/mL INSIGNIFICANT GROWTH Performed at [facility] [address], [place],  [address]    Report Status 10/27/2020 FINAL  Final     Labs: Basic Metabolic Panel: Recent Labs  Lab 10/23/20 1550 10/25/20 1110 10/26/20 0434 10/27/20 0424 10/28/20 0431  NA 138 139 138 138 138  K 3.7 4.3 4.4 4.3 3.9  CL 101 105 104 106 108  CO2 30 27 27 24 23   GLUCOSE 113* 78 73 91 107*  BUN 23 19 16 15 15   CREATININE 1.17 1.14 1.09 1.09 0.93  CALCIUM 9.1 9.1 8.8* 8.5* 8.3*  MG  --   --  1.6* 1.8 1.7  PHOS  --   --  2.6 2.2* 2.0*  Liver Function Tests: Recent Labs  Lab 10/25/20 1110 10/26/20 0434 10/27/20 0424 10/28/20 0431  AST 25 24 51* 64*  ALT 17 15 21 24   ALKPHOS 50 44 46 48  BILITOT 0.8 0.8 0.7 0.8  PROT 5.8* 5.1* 5.3* 5.5*  ALBUMIN 3.6 3.1* 3.2* 3.2*   Recent Labs  Lab 10/25/20 1110  LIPASE 28   No results for input(s): AMMONIA in the last 168 hours. CBC: Recent Labs  Lab 10/23/20 1550 10/25/20 1110 10/26/20 0434 10/27/20 0424 10/28/20 0431  WBC 4.4 5.4 3.5* 3.7* 3.7*  NEUTROABS 3.1 4.3 2.4 2.5 2.6  HGB 13.7 12.3* 11.3* 11.9* 11.8*  HCT 40.9 36.2* 33.6* 35.3* 36.6*  MCV 105.7* 104.6* 105.7* 106.3* 108.6*  PLT 124* 115* 97* 91* 91*   Cardiac Enzymes: No results for input(s): CKTOTAL, CKMB, CKMBINDEX, TROPONINI in the last 168 hours. BNP: BNP (last 3 results) No results for input(s): BNP in the last 8760 hours.  ProBNP (last 3 results) No results for input(s):  PROBNP in the last 8760 hours.  CBG: Recent Labs  Lab 10/23/20 1612 10/26/20 0641 10/27/20 0454  GLUCAP 100* 129* 74       Signed:  Alma Friendly, MD [facility] 10/28/2020, 8:06 PM

## 2020-10-28 NOTE — TOC Progression Note (Signed)
Transition of Care [facility]) - Progression Note    Patient Details  Name: Tanner Hensley MRN: [id number] Date of Birth: 18-Jan-1941  Transition of Care [facility]) CM/SW Contact  Purcell Mouton, RN Phone Number: 10/28/2020, 10:30 AM  Clinical Narrative:    Spoke with pt again concerning Home Health. Pt agreed with [facility] related to [facility] not being able to take pt back due to staffing.    Expected Discharge Plan: [place] Barriers to Discharge: No Barriers Identified  Expected Discharge Plan and Services Expected Discharge Plan: [place]   Discharge Planning Services: CM Consult   Living arrangements for the past 2 months: Single Family Home Expected Discharge Date: 10/27/20                                     Social Determinants of Health (SDOH) Interventions    Readmission Risk Interventions No flowsheet data found.

## 2020-10-28 NOTE — Progress Notes (Signed)
[place] for warfarin Indication: hx atrial fibrillation  Allergies  Allergen Reactions  . Codeine Rash and Other (See Comments)    Dizziness, low blood pressure and heart rate  . Hydrocodone-Acetaminophen Rash and Other (See Comments)    Dizziness, low blood pressure and heart rate  . Tramadol Nausea And Vomiting  . Lipitor [Atorvastatin] Other (See Comments)    Myalgias   . Robaxin [Methocarbamol] Other (See Comments)    Dropped blood pressure too much  . Zocor [Simvastatin] Other (See Comments)    myalgias    Patient Measurements: Height: 5\' 5"  (165.1 cm) Weight: 76.1 kg (167 lb 12.3 oz) IBW/kg (Calculated) : 61.5  Vital Signs: Temp: 97.6 F (36.4 C) (02/22 0357) Temp Source: Oral (02/22 0357) BP: 144/72 (02/22 0357) Pulse Rate: 63 (02/22 0357)  Labs: Recent Labs    10/25/20 1110 10/25/20 1315 10/26/20 0434 10/27/20 0424 10/28/20 0431  HGB 12.3*  --  11.3* 11.9* 11.8*  HCT 36.2*  --  33.6* 35.3* 36.6*  PLT 115*  --  97* 91* 91*  LABPROT 26.8*  --  28.2* 30.8* 29.1*  INR 2.6*  --  2.7* 3.1* 2.9*  CREATININE 1.14  --  1.09 1.09 0.93  TROPONINIHS 20* 20*  --   --   --     Estimated Creatinine Clearance: 60.3 mL/min (by C-G formula based on SCr of 0.93 mg/dL).   Medications:  - PTA warfarin regimen: 5mg  daily except 2.5mg  on Wed (last dose taken on 2/18)  Assessment: Patient is an 80 y.o M with COVID-19 (tested positive on 2/7) and hx afib on warfarin PTA, presented to the ED on 2/19 with c/o hypotension and syncopal episode. Warfarin resumed on admission.  Today, 10/28/2020:  INR = 2.9, within goal   CBC: Hgb (11.8) slightly low but stable; Plt (91) low but stable  Diet: Carb modified, 10% of meals charted yesterday  DDI: Remdesivir may enhance effects of warfarin. Last dose of remdesivir given yesterday  Goal of Therapy:  INR 2-3 Monitor platelets by anticoagulation protocol: Yes   Plan:   Repeat  Warfarin 2.5 mg PO once this evening  INR with AM labs tomorrow  Efraim Kaufmann, PharmD, BCPS 10/28/2020,10:02 AM

## 2020-10-28 NOTE — Progress Notes (Signed)
Physical Therapy Treatment Patient Details Name: Tanner Hensley MRN: [id number] DOB: 1940-10-04 Today's Date: 10/28/2020    History of Present Illness 80 yo male admitted with COVID, syncope. Hx of lumbar fusion    PT Comments    Progressing with mobility. O2>90% on RA with activity. Pt tolerated activity well. Continues to c/o mild lightheadedness and exhibits mild unsteadiness.    Follow Up Recommendations  Home health PT;Supervision for mobility/OOB     Equipment Recommendations  None recommended by PT    Recommendations for Other Services       Precautions / Restrictions Precautions Precautions: Fall Precaution Comments: dizziness Restrictions Weight Bearing Restrictions: No    Mobility  Bed Mobility Overal bed mobility: Modified Independent                  Transfers     Transfers: Sit to/from Stand Sit to Stand: Supervision            Ambulation/Gait Ambulation/Gait assistance: Min guard Gait Distance (Feet): 150 Feet Assistive device: None Gait Pattern/deviations: Step-through pattern;Decreased stride length     General Gait Details: mildly unsteady but no overt LOB. Pt reported mild lightheadedness. O2>90% on RA.   Stairs             Wheelchair Mobility    Modified Rankin (Stroke Patients Only)       Balance             Standing balance-Leahy Scale: Fair                              Cognition Arousal/Alertness: Awake/alert Behavior During Therapy: WFL for tasks assessed/performed Overall Cognitive Status: Within Functional Limits for tasks assessed                                 General Comments: some memory deficits at baseline per pt      Exercises      General Comments        Pertinent Vitals/Pain Pain Assessment: No/denies pain    Home Living                      Prior Function            PT Goals (current goals can now be found in the care plan section)  Progress towards PT goals: Progressing toward goals    Frequency    Min 3X/week      PT Plan Current plan remains appropriate    Co-evaluation              AM-PAC PT "6 Clicks" Mobility   Outcome Measure  Help needed turning from your back to your side while in a flat bed without using bedrails?: None Help needed moving from lying on your back to sitting on the side of a flat bed without using bedrails?: None Help needed moving to and from a bed to a chair (including a wheelchair)?: A Little Help needed standing up from a chair using your arms (e.g., wheelchair or bedside chair)?: A Little Help needed to walk in hospital room?: A Little Help needed climbing 3-5 steps with a railing? : A Little 6 Click Score: 20    End of Session   Activity Tolerance: Patient tolerated treatment well Patient left: in bed;with call bell/phone within reach;with bed alarm set   PT Visit  Diagnosis: Unsteadiness on feet (R26.81);Muscle weakness (generalized) (M62.81)     Time: 1959-7471 PT Time Calculation (min) (ACUTE ONLY): 26 min  Charges:  $Gait Training: 23-37 mins                        Doreatha Massed, PT Acute Rehabilitation  Office: [phone] Pager: [phone]

## 2020-10-29 ENCOUNTER — Telehealth: Payer: Self-pay | Admitting: Nurse Practitioner

## 2020-10-29 ENCOUNTER — Telehealth: Payer: Self-pay | Admitting: Family Medicine

## 2020-10-29 ENCOUNTER — Ambulatory Visit: Payer: Medicare Other

## 2020-10-29 DIAGNOSIS — I482 Chronic atrial fibrillation, unspecified: Secondary | ICD-10-CM | POA: Diagnosis not present

## 2020-10-29 DIAGNOSIS — E119 Type 2 diabetes mellitus without complications: Secondary | ICD-10-CM | POA: Diagnosis not present

## 2020-10-29 DIAGNOSIS — I2510 Atherosclerotic heart disease of native coronary artery without angina pectoris: Secondary | ICD-10-CM | POA: Diagnosis not present

## 2020-10-29 DIAGNOSIS — Z950 Presence of cardiac pacemaker: Secondary | ICD-10-CM | POA: Diagnosis not present

## 2020-10-29 DIAGNOSIS — R55 Syncope and collapse: Secondary | ICD-10-CM | POA: Diagnosis not present

## 2020-10-29 DIAGNOSIS — U071 COVID-19: Secondary | ICD-10-CM | POA: Diagnosis not present

## 2020-10-29 DIAGNOSIS — E860 Dehydration: Secondary | ICD-10-CM | POA: Diagnosis not present

## 2020-10-29 DIAGNOSIS — Z7901 Long term (current) use of anticoagulants: Secondary | ICD-10-CM | POA: Diagnosis not present

## 2020-10-29 NOTE — Telephone Encounter (Signed)
Please advise on verbal

## 2020-10-29 NOTE — Telephone Encounter (Signed)
Called Pt VM was left for Pt to Call the [facility] to follow up from his resent Ed visit

## 2020-10-29 NOTE — Telephone Encounter (Signed)
Erline Levine a Physical Therapy is calling and wanted to see if she can get verbal orders to work with patient for 1 week, 2 week 1 and 1 week 2, please advise. CB is [phone]

## 2020-10-30 LAB — CULTURE, BLOOD (ROUTINE X 2)
Culture: NO GROWTH
Special Requests: ADEQUATE

## 2020-10-30 NOTE — Telephone Encounter (Signed)
Please okay these orders  ?

## 2020-10-31 NOTE — Telephone Encounter (Signed)
Verbal given 

## 2020-11-03 ENCOUNTER — Telehealth (INDEPENDENT_AMBULATORY_CARE_PROVIDER_SITE_OTHER): Payer: Medicare Other | Admitting: Family Medicine

## 2020-11-03 ENCOUNTER — Encounter: Payer: Self-pay | Admitting: Family Medicine

## 2020-11-03 VITALS — Wt 167.0 lb

## 2020-11-03 DIAGNOSIS — U071 COVID-19: Secondary | ICD-10-CM | POA: Diagnosis not present

## 2020-11-03 MED ORDER — BENZONATATE 200 MG PO CAPS: 200.0000 mg | ORAL_CAPSULE | Freq: Four times a day (QID) | ORAL | 2 refills | Status: DC | PRN

## 2020-11-03 MED ORDER — DM-GUAIFENESIN ER 30-600 MG PO TB12
1.0000 | ORAL_TABLET | Freq: Two times a day (BID) | ORAL | 2 refills | Status: DC
Start: 2020-11-03 — End: 2021-08-11

## 2020-11-03 NOTE — Progress Notes (Signed)
   Subjective:    Patient ID: Tanner Hensley, male    DOB: 11/26/1940, 80 y.o.   MRN: [id number]  HPI Virtual Visit via Telephone Note  I connected with the patient on 11/03/20 at  2:45 PM EST by telephone and verified that I am speaking with the correct person using two identifiers.   I discussed the limitations, risks, security and privacy concerns of performing an evaluation and management service by telephone and the availability of in person appointments. I also discussed with the patient that there may be a patient responsible charge related to this service. The patient expressed understanding and agreed to proceed.  Location patient: home Location provider: work or home office Participants present for the call: patient, provider Patient did not have a visit in the prior 7 days to address this/these issue(s).   History of Present Illness: Here with his wife to follow up on a hospital stay from 10-25-20 to 10-28-20 for Covid pneumonia. He received Remdesivir IV for 3 days and steroids. He had some prerenal azotemia but this resolved with IV fluids. Since coming home he has slowly been getting better. He is still weak and has a dry cough. No chest pain or SOB or fever. His oxygen sats at home today are 97%. His BP was 130/76. He is still weak, but he is getting stronger every day. His appetite is improving. Using Mucinex DM and Benzonatate.    Observations/Objective: Patient sounds cheerful and well on the phone. I do not appreciate any SOB. Speech and thought processing are grossly intact. Patient reported vitals:  Assessment and Plan: He is recovering from a Covid pneumonia. He will rest at home and eat a balanced diet. Drink plenty of fluids. Recheck as needed.  Alysia Penna, MD   Follow Up Instructions:     [phone] 5-10 [phone] 11-20 9443 21-30 I did not refer this patient for an OV in the next 24 hours for this/these issue(s).  I discussed the assessment and treatment plan  with the patient. The patient was provided an opportunity to ask questions and all were answered. The patient agreed with the plan and demonstrated an understanding of the instructions.   The patient was advised to call back or seek an in-person evaluation if the symptoms worsen or if the condition fails to improve as anticipated.  I provided 25 minutes of non-face-to-face time during this encounter.   Alysia Penna, MD    Review of Systems     Objective:   Physical Exam        Assessment & Plan:

## 2020-11-03 NOTE — Progress Notes (Signed)
   Subjective:    Patient ID: Tanner Hensley, male    DOB: Aug 14, 1941, 80 y.o.   MRN: [id number]  HPI    Review of Systems     Objective:   Physical Exam        Assessment & Plan:

## 2020-11-04 DIAGNOSIS — Z7901 Long term (current) use of anticoagulants: Secondary | ICD-10-CM | POA: Diagnosis not present

## 2020-11-04 DIAGNOSIS — R55 Syncope and collapse: Secondary | ICD-10-CM | POA: Diagnosis not present

## 2020-11-04 DIAGNOSIS — E860 Dehydration: Secondary | ICD-10-CM | POA: Diagnosis not present

## 2020-11-04 DIAGNOSIS — Z950 Presence of cardiac pacemaker: Secondary | ICD-10-CM | POA: Diagnosis not present

## 2020-11-04 DIAGNOSIS — I482 Chronic atrial fibrillation, unspecified: Secondary | ICD-10-CM | POA: Diagnosis not present

## 2020-11-04 DIAGNOSIS — I2510 Atherosclerotic heart disease of native coronary artery without angina pectoris: Secondary | ICD-10-CM | POA: Diagnosis not present

## 2020-11-04 DIAGNOSIS — E119 Type 2 diabetes mellitus without complications: Secondary | ICD-10-CM | POA: Diagnosis not present

## 2020-11-04 DIAGNOSIS — U071 COVID-19: Secondary | ICD-10-CM | POA: Diagnosis not present

## 2020-11-05 ENCOUNTER — Encounter: Payer: Self-pay | Admitting: Family Medicine

## 2020-11-05 ENCOUNTER — Other Ambulatory Visit: Payer: Self-pay

## 2020-11-05 ENCOUNTER — Telehealth: Payer: Self-pay | Admitting: Family Medicine

## 2020-11-05 ENCOUNTER — Encounter (HOSPITAL_COMMUNITY): Payer: Self-pay | Admitting: Emergency Medicine

## 2020-11-05 ENCOUNTER — Emergency Department (HOSPITAL_COMMUNITY): Payer: Medicare Other

## 2020-11-05 ENCOUNTER — Other Ambulatory Visit (HOSPITAL_BASED_OUTPATIENT_CLINIC_OR_DEPARTMENT_OTHER): Payer: Self-pay

## 2020-11-05 ENCOUNTER — Emergency Department (HOSPITAL_COMMUNITY)
Admission: EM | Admit: 2020-11-05 | Discharge: 2020-11-05 | Disposition: A | Discharge status: Home / Self Care | Payer: Medicare Other | Attending: Emergency Medicine | Admitting: Emergency Medicine

## 2020-11-05 DIAGNOSIS — I2510 Atherosclerotic heart disease of native coronary artery without angina pectoris: Secondary | ICD-10-CM | POA: Insufficient documentation

## 2020-11-05 DIAGNOSIS — I509 Heart failure, unspecified: Secondary | ICD-10-CM | POA: Insufficient documentation

## 2020-11-05 DIAGNOSIS — S3991XA Unspecified injury of abdomen, initial encounter: Secondary | ICD-10-CM | POA: Diagnosis present

## 2020-11-05 DIAGNOSIS — Z8616 Personal history of COVID-19: Secondary | ICD-10-CM | POA: Insufficient documentation

## 2020-11-05 DIAGNOSIS — R0902 Hypoxemia: Secondary | ICD-10-CM | POA: Diagnosis not present

## 2020-11-05 DIAGNOSIS — K449 Diaphragmatic hernia without obstruction or gangrene: Secondary | ICD-10-CM | POA: Diagnosis not present

## 2020-11-05 DIAGNOSIS — E119 Type 2 diabetes mellitus without complications: Secondary | ICD-10-CM | POA: Insufficient documentation

## 2020-11-05 DIAGNOSIS — R1901 Right upper quadrant abdominal swelling, mass and lump: Secondary | ICD-10-CM | POA: Diagnosis not present

## 2020-11-05 DIAGNOSIS — Z950 Presence of cardiac pacemaker: Secondary | ICD-10-CM | POA: Insufficient documentation

## 2020-11-05 DIAGNOSIS — Z79899 Other long term (current) drug therapy: Secondary | ICD-10-CM | POA: Insufficient documentation

## 2020-11-05 DIAGNOSIS — N200 Calculus of kidney: Secondary | ICD-10-CM | POA: Diagnosis not present

## 2020-11-05 DIAGNOSIS — I4891 Unspecified atrial fibrillation: Secondary | ICD-10-CM | POA: Diagnosis not present

## 2020-11-05 DIAGNOSIS — X58XXXA Exposure to other specified factors, initial encounter: Secondary | ICD-10-CM | POA: Diagnosis not present

## 2020-11-05 DIAGNOSIS — F0390 Unspecified dementia without behavioral disturbance: Secondary | ICD-10-CM | POA: Diagnosis not present

## 2020-11-05 DIAGNOSIS — R140 Abdominal distension (gaseous): Secondary | ICD-10-CM | POA: Diagnosis not present

## 2020-11-05 DIAGNOSIS — Y92009 Unspecified place in unspecified non-institutional (private) residence as the place of occurrence of the external cause: Secondary | ICD-10-CM | POA: Diagnosis not present

## 2020-11-05 DIAGNOSIS — R791 Abnormal coagulation profile: Secondary | ICD-10-CM | POA: Insufficient documentation

## 2020-11-05 DIAGNOSIS — Z7901 Long term (current) use of anticoagulants: Secondary | ICD-10-CM | POA: Insufficient documentation

## 2020-11-05 DIAGNOSIS — I482 Chronic atrial fibrillation, unspecified: Secondary | ICD-10-CM | POA: Diagnosis not present

## 2020-11-05 DIAGNOSIS — Z743 Need for continuous supervision: Secondary | ICD-10-CM | POA: Diagnosis not present

## 2020-11-05 DIAGNOSIS — S301XXA Contusion of abdominal wall, initial encounter: Secondary | ICD-10-CM | POA: Insufficient documentation

## 2020-11-05 DIAGNOSIS — D649 Anemia, unspecified: Secondary | ICD-10-CM | POA: Diagnosis not present

## 2020-11-05 DIAGNOSIS — R6889 Other general symptoms and signs: Secondary | ICD-10-CM | POA: Diagnosis not present

## 2020-11-05 DIAGNOSIS — I110 Hypertensive heart disease with heart failure: Secondary | ICD-10-CM | POA: Insufficient documentation

## 2020-11-05 DIAGNOSIS — U071 COVID-19: Secondary | ICD-10-CM | POA: Diagnosis not present

## 2020-11-05 DIAGNOSIS — R55 Syncope and collapse: Secondary | ICD-10-CM | POA: Diagnosis not present

## 2020-11-05 DIAGNOSIS — E860 Dehydration: Secondary | ICD-10-CM | POA: Diagnosis not present

## 2020-11-05 LAB — HEPATIC FUNCTION PANEL
ALT: 26 U/L (ref 0–44)
AST: 31 U/L (ref 15–41)
Albumin: 3.6 g/dL (ref 3.5–5.0)
Alkaline Phosphatase: 60 U/L (ref 38–126)
Bilirubin, Direct: 0.1 mg/dL (ref 0.0–0.2)
Indirect Bilirubin: 1 mg/dL — ABNORMAL HIGH (ref 0.3–0.9)
Total Bilirubin: 1.1 mg/dL (ref 0.3–1.2)
Total Protein: 6.3 g/dL — ABNORMAL LOW (ref 6.5–8.1)

## 2020-11-05 LAB — CBC WITH DIFFERENTIAL/PLATELET
Abs Immature Granulocytes: 0.06 10*3/uL (ref 0.00–0.07)
Basophils Absolute: 0 10*3/uL (ref 0.0–0.1)
Basophils Relative: 0 %
Eosinophils Absolute: 0.2 10*3/uL (ref 0.0–0.5)
Eosinophils Relative: 2 %
HCT: 34.4 % — ABNORMAL LOW (ref 39.0–52.0)
Hemoglobin: 11.5 g/dL — ABNORMAL LOW (ref 13.0–17.0)
Immature Granulocytes: 1 %
Lymphocytes Relative: 14 %
Lymphs Abs: 1 10*3/uL (ref 0.7–4.0)
MCH: 35.5 pg — ABNORMAL HIGH (ref 26.0–34.0)
MCHC: 33.4 g/dL (ref 30.0–36.0)
MCV: 106.2 fL — ABNORMAL HIGH (ref 80.0–100.0)
Monocytes Absolute: 0.9 10*3/uL (ref 0.1–1.0)
Monocytes Relative: 13 %
Neutro Abs: 5 10*3/uL (ref 1.7–7.7)
Neutrophils Relative %: 70 %
Platelets: 213 10*3/uL (ref 150–400)
RBC: 3.24 MIL/uL — ABNORMAL LOW (ref 4.22–5.81)
RDW: 12.1 % (ref 11.5–15.5)
WBC: 7.2 10*3/uL (ref 4.0–10.5)
nRBC: 0 % (ref 0.0–0.2)

## 2020-11-05 LAB — BASIC METABOLIC PANEL
Anion gap: 8 (ref 5–15)
BUN: 13 mg/dL (ref 8–23)
CO2: 27 mmol/L (ref 22–32)
Calcium: 9.4 mg/dL (ref 8.9–10.3)
Chloride: 106 mmol/L (ref 98–111)
Creatinine, Ser: 1.17 mg/dL (ref 0.61–1.24)
GFR, Estimated: >60 mL/min (ref >60)
Glucose, Bld: 129 mg/dL — ABNORMAL HIGH (ref 70–99)
Potassium: 4.2 mmol/L (ref 3.5–5.1)
Sodium: 141 mmol/L (ref 135–145)

## 2020-11-05 LAB — URINALYSIS, ROUTINE W REFLEX MICROSCOPIC
Bacteria, UA: NONE SEEN
Bilirubin Urine: NEGATIVE
Glucose, UA: NEGATIVE mg/dL
Ketones, ur: NEGATIVE mg/dL
Leukocytes,Ua: NEGATIVE
Nitrite: NEGATIVE
Protein, ur: NEGATIVE mg/dL
RBC / HPF: >50 RBC/hpf — ABNORMAL HIGH (ref 0–5)
Specific Gravity, Urine: 1.029 (ref 1.005–1.030)
pH: 5 (ref 5.0–8.0)

## 2020-11-05 LAB — PROTIME-INR
INR: 6.5 (ref 0.8–1.2)
Prothrombin Time: 55 seconds — ABNORMAL HIGH (ref 11.4–15.2)

## 2020-11-05 LAB — APTT: aPTT: 60 seconds — ABNORMAL HIGH (ref 24–36)

## 2020-11-05 MED ORDER — IOHEXOL 300 MG/ML  SOLN
100.0000 mL | Freq: Once | INTRAMUSCULAR | Status: AC | PRN
  Administered 2020-11-05: 100 mL via INTRAVENOUS

## 2020-11-05 NOTE — ED Notes (Signed)
Patient is at CT Scan

## 2020-11-05 NOTE — Telephone Encounter (Signed)
I agree with holding the Coumadin until Monday. The pneumonia seen on the CT scan is from the Covid virus infection, which we already knew he has. There is no specific treatment for this, it should resolve over time.

## 2020-11-05 NOTE — Telephone Encounter (Signed)
Will from [facility] need verbal orders for OT for two weeks 1 and 1 week 1. I also want to report that the patient had some abdominal busing, spoke with the triage nurse was informed to go to ER. please call will to give order [phone]

## 2020-11-05 NOTE — Telephone Encounter (Signed)
This is likely from the PT session. I agree with what you are doing with the Coumadin. Let me know if this keeps happening.

## 2020-11-05 NOTE — Telephone Encounter (Signed)
Pt wife call and want to know if dr.Fry will call him something for pneumonia the hospital told him to call his pcp.

## 2020-11-05 NOTE — Telephone Encounter (Signed)
Please okay the OT orders

## 2020-11-05 NOTE — Telephone Encounter (Signed)
Please advise 

## 2020-11-05 NOTE — Telephone Encounter (Signed)
As my other answer said, he has Covid pneumonia (which is viral) so there is no medication to call in (antibiotics do not work in this situation)

## 2020-11-05 NOTE — ED Notes (Signed)
Patient transported to CT 

## 2020-11-05 NOTE — ED Provider Notes (Signed)
[facility] DEPT Provider Note   CSN: [id number] Arrival date & time: 11/05/20  1156     History No chief complaint on file.   Tanner Hensley is a 80 y.o. male presenting for evaluation of abd bruising.   Pt states he noticed bruising of his abd yesterday when taking off his shirt. Bruising has improved slightly today, but is still present. He denies pain. He denies recent trauma/ injury. He states he was recently admitted for covid, and there was some concern about warfarin dosing while in the hospital.  He denies fevers chills, sob, n/v, abd pain, urinary sxs including hematuria, or hematochezia/melena. He denies epistaxis/bleeding from the gums. He has been on warfarin for ~20 yrs for a fib. He denies dizziness/lightheadedness. He has a mild cough, but his is improving.   Additional history obtained from chart review.  Patient with a history of A. fib on warfarin, CHF, CAD, diabetes, GERD, hypertension, hyperlipidemia, early dementia.  I reviewed recent hospitalization, patient did receive remdesivir for COVID during his admission.  HPI     Past Medical History:  Diagnosis Date  . Arthritis    sees Dr. Charlestine Night  . Atrial fibrillation ([place])    (Dr. Phylliss Blakes at [facility] and Dr. Lovena Le at [facility]. [facility]es [facility] polymyalgia rheumatica (Dr. Charlestine Night)  . CHF (congestive heart failure) ([facility])    NOT SURE WHEN  . Coronary artery disease   . Diabetes mellitus type II    DIET CONTROLLED AND LOST 40 LBS  . Dysrhythmia    chronic a=fib with cardiac ablations  . GERD (gastroesophageal reflux disease)   . Gout   . High cholesterol   . Hypertension   . Insomnia   . Memory loss   . Neuromuscular disorder ([place])    POLY MYALGIA   RHEUMATICA  . Polymyalgia rheumatica ([place])    sees Dr. Charlestine Night   . Presence of permanent cardiac pacemaker    [employer]  08/2011  . Urticaria     Patient Active Problem List   Diagnosis Date Noted   . Macrocytic anemia 10/26/2020  . COVID-19 virus infection 10/15/2020  . Arthritis 02/13/2019  . Pulmonary infiltrates on CXR 06/21/2018  . Upper airway cough syndrome 06/20/2018  . Type 2 diabetes mellitus with other specified complication ([place]) 81/82/9937  . Lower extremity weakness   . Radiculopathy 10/13/2016  . Syncope 10/13/2016  . CAD (coronary artery disease) 10/13/2016  . Memory loss 02/22/2014  . 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 KNEE SPRAIN 11/10/2009  . INGUINAL HERNIA 09/17/2009  . CHEST WALL PAIN, ACUTE 09/17/2009  . Dyslipidemia 09/13/2008  . OVERACTIVE BLADDER 01/26/2008  . 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 Essential hypertension 05/22/2007  . GERD 05/22/2007  . ATRIAL FIBRILLATION, CHRONIC 05/01/2007    Past Surgical History:  Procedure Laterality Date  . ATRIAL FIBRILLATION ABLATION  2006, 2008, 2013   at [facility]  by Dr. Ola Spurr  . BACK SURGERY    . CARDIAC CATHETERIZATION     has had multiple, but last one 2017 (10/13/2016)  . COLONOSCOPY W/ BIOPSIES AND POLYPECTOMY  04/05/2017   per Dr. Oretha Caprice, no polyps, no repeats needed   . CORONARY ANGIOPLASTY WITH STENT PLACEMENT  ~ 2014   "he has 2 stents" (10/13/2016)  . ESOPHAGOGASTRODUODENOSCOPY    . INGUINAL HERNIA REPAIR Right 11/28/2009   per Dr. Donnie Mesa  . KNEE ARTHROSCOPY Left 02/03/2010   per Dr.  Ron [employer]  . POSTERIOR LUMBAR FUSION  10/14/2016  . SPINE SURGERY  10/13/2016   per Dr. Lynann Bologna, L3-4 fusion  . TONSILLECTOMY         Family History  Problem Relation Age of Onset  . Coronary artery disease Other   . Hypertension Other   . Heart disease Other   . Heart disease Mother   . Heart attack Father   . Heart disease Sister   . Diabetes Sister   . Hypertension Sister   . Other Brother        brain tumor  . Hypertension Brother   . Heart disease Brother     Social History   Tobacco Use  . Smoking status:  Never Smoker  . Smokeless tobacco: Never Used  Vaping Use  . Vaping Use: Never used  Substance Use Topics  . Alcohol use: No    Alcohol/week: 0.0 standard drinks  . Drug use: No    Home Medications Prior to Admission medications   Medication Sig Start Date End Date Taking? Authorizing Provider  acetaminophen (TYLENOL) 500 MG tablet Take 1,000 mg by mouth every 4 (four) hours as needed for mild pain.    [provider]  alendronate (FOSAMAX) 70 MG tablet TAKE 1 TABLET BY MOUTH  WEEKLY WITH 8 OUNCE OF  PLAIN WATER 1/2 HOUR BEFORE FIRST FOOD DRINK OR MEDS.  STAY UPRIGHT FOR 1/2 HOUR Patient taking differently: Take 70 mg by mouth every Friday. 03/07/20   Laurey Morale, MD  ascorbic acid (VITAMIN C) 500 MG tablet Take 500 mg by mouth daily. 10/31/19   [provider]  aspirin EC 81 MG tablet Take 81 mg by mouth daily.    [provider]  atenolol (TENORMIN) 25 MG tablet Take 25 mg by mouth daily. 09/14/17   [provider]  b complex vitamins tablet Take 1 tablet by mouth daily.    [provider]  benzonatate (TESSALON) 200 MG capsule Take 1 capsule (200 mg total) by mouth every 6 (six) hours as needed for cough. 11/03/20   Laurey Morale, MD  Biotin 1000 MCG tablet Take 5,000 mcg by mouth daily.     [provider]  calcium citrate-vitamin D (CITRACAL+D) 315-200 MG-UNIT tablet Take 1 tablet by mouth 2 (two) times daily.    [provider]  dextromethorphan-guaiFENesin (MUCINEX DM) 30-600 MG 12hr tablet Take 1 tablet by mouth 2 (two) times daily. 11/03/20   Laurey Morale, MD  donepezil (ARICEPT) 10 MG tablet TAKE 1 TABLET BY MOUTH AT  BEDTIME Patient taking differently: Take 10 mg by mouth at bedtime. 12/31/19   Laurey Morale, MD  memantine (NAMENDA) 10 MG tablet TAKE 1 TABLET BY MOUTH  TWICE DAILY Patient taking differently: Take 10 mg by mouth 2 (two) times daily. 08/05/20   Laurey Morale, MD  Multiple Vitamin (MULTIVITAMIN WITH  MINERALS) TABS tablet Take 1 tablet by mouth daily. SENIOR MULTIVITAMIN    [provider]  olmesartan (BENICAR) 20 MG tablet TAKE 1 TABLET BY MOUTH  DAILY Patient taking differently: Take 20 mg by mouth daily. 07/22/20   Laurey Morale, MD  ondansetron (ZOFRAN ODT) 4 MG disintegrating tablet 4mg  ODT q4 hours prn nausea/vomit Patient taking differently: Take 4 mg by mouth every 4 (four) hours as needed for nausea or vomiting. 10/23/20   Deno Etienne, DO  oxybutynin (DITROPAN-XL) 5 MG 24 hr tablet Take 1 tablet (5 mg total) by mouth at bedtime. 07/01/20  Laurey Morale, MD  warfarin (COUMADIN) 5 MG tablet TAKE 1 TABLET BY MOUTH  DAILY EXCEPT 1.5 TABLETS ON WEDNESDAY AND SATURDAY OR  AS DIRECTED BY  ANTICOAGULATION CLINIC Patient taking differently: Take 2.5-5 mg by mouth See admin instructions. Take 5mg  every day except Wednesday (2.5mg ) 07/28/20   Laurey Morale, MD    Allergies    Codeine, Hydrocodone-acetaminophen, Tramadol, Lipitor [atorvastatin], Robaxin [methocarbamol], and Zocor [simvastatin]  Review of Systems   Review of Systems  Gastrointestinal:       Abd bruising   Hematological: Bruises/bleeds easily.  All other systems reviewed and are negative.   Physical Exam Updated Vital Signs BP (!) 160/66   Pulse 60   Temp 98.3 F (36.8 C) (Oral)   Resp 18   Ht 5\' 5"  (1.651 m)   Wt 76 kg   SpO2 95%   BMI 27.88 kg/m   Physical Exam Vitals and nursing note reviewed.  Constitutional:      General: He is not in acute distress.    Appearance: He is well-developed and well-nourished.     Comments: Appears nontoxic  HENT:     Head: Normocephalic and atraumatic.  Eyes:     Extraocular Movements: EOM normal.     Conjunctiva/sclera: Conjunctivae normal.     Pupils: Pupils are equal, round, and reactive to light.  Cardiovascular:     Rate and Rhythm: Normal rate and regular rhythm.     Pulses: Normal pulses and intact distal pulses.  Pulmonary:     Effort: Pulmonary  effort is normal. No respiratory distress.     Breath sounds: Normal breath sounds. No wheezing.  Abdominal:     General: There is no distension.     Palpations: Abdomen is soft. There is mass.     Tenderness: There is no abdominal tenderness. There is no right CVA tenderness, left CVA tenderness, guarding or rebound.     Comments: bruising across the lower abd with surroduing yellowing of skin. Does not extend to the flank. No ttp. There is a firm mass of the RUQ. No cva tenderness.   Musculoskeletal:        General: Normal range of motion.     Cervical back: Normal range of motion and neck supple.  Skin:    General: Skin is warm and dry.     Capillary Refill: Capillary refill takes less than 2 seconds.  Neurological:     Mental Status: He is alert and oriented to person, place, and time.  Psychiatric:        Mood and Affect: Mood and affect normal.     ED Results / Procedures / Treatments   Labs (all labs ordered are listed, but only abnormal results are displayed) Labs Reviewed  CBC WITH DIFFERENTIAL/PLATELET - Abnormal; Notable for the following components:      Result Value   RBC 3.24 (*)    Hemoglobin 11.5 (*)    HCT 34.4 (*)    MCV 106.2 (*)    MCH 35.5 (*)    All other components within normal limits  BASIC METABOLIC PANEL - Abnormal; Notable for the following components:   Glucose, Bld 129 (*)    All other components within normal limits  PROTIME-INR - Abnormal; Notable for the following components:   Prothrombin Time 55.0 (*)    INR 6.5 (*)    All other components within normal limits  APTT - Abnormal; Notable for the following components:   aPTT 60 (*)  All other components within normal limits  URINALYSIS, ROUTINE W REFLEX MICROSCOPIC - Abnormal; Notable for the following components:   Color, Urine AMBER (*)    Hgb urine dipstick SMALL (*)    RBC / HPF >50 (*)    All other components within normal limits  HEPATIC FUNCTION PANEL - Abnormal; Notable for the  following components:   Total Protein 6.3 (*)    Indirect Bilirubin 1.0 (*)    All other components within normal limits    EKG None  Radiology CT ABDOMEN PELVIS W CONTRAST  Result Date: 11/05/2020 CLINICAL DATA:  Abdominal trauma. Abdominal bruising with no known injury. EXAM: CT ABDOMEN AND PELVIS WITH CONTRAST TECHNIQUE: Multidetector CT imaging of the abdomen and pelvis was performed using the standard protocol following bolus administration of intravenous contrast. CONTRAST:  111mL OMNIPAQUE IOHEXOL 300 MG/ML  SOLN COMPARISON:  CT 10/25/2020 FINDINGS: Lower chest: Bilateral small pleural effusions increased from prior New nodular airspace densities in LEFT and RIGHT lower lobe. Greater density in the RIGHT lower lobe. Individual nodules measure 5 to 10 mm. Hepatobiliary: No focal hepatic lesion. No biliary duct dilatation. Common bile duct is normal. Pancreas: Pancreas is normal. No ductal dilatation. No pancreatic inflammation. Spleen: Normal spleen Adrenals/urinary tract: Adrenal glands normal. Nonobstructing calculus in LEFT and RIGHT kidney. No ureterolithiasis or obstructive uropathy. No bladder calculi Stomach/Bowel: Small hiatal hernia. Stomach, duodenum and small-bowel normal. Appendix normal. Colon and rectosigmoid colon normal. There are diverticula sigmoid colon without acute inflammation. Vascular/Lymphatic: Abdominal aorta is normal caliber with atherosclerotic calcification. There is no retroperitoneal or periportal lymphadenopathy. No pelvic lymphadenopathy. Reproductive: Prostate enlarged. Other: No free fluid. Musculoskeletal: Posterior lumbar fusion. There is a rounded lesion within the RIGHT rectus muscle which is new from 10/25/2020. Lesion measures 3.0 x 2.3 by a 4.0 cm. Lesion seen on axial image 32/2. Lesion relatively small volume.) IMPRESSION: 1. Fusiform focal enlargement of the RIGHT rectus abdominus muscle consistent with spontaneous hemorrhage. Small volume rectus sheath  hematoma. 2. New nodular airspace disease in lung bases. Differential includes aspiration pneumonitis, bibasilar pneumonia, or viral pneumonia (COVID pneumonia). Electronically Signed   By: Suzy Bouchard M.D.   On: 11/05/2020 16:04    Procedures Procedures   Medications Ordered in ED Medications  iohexol (OMNIPAQUE) 300 MG/ML solution 100 mL (100 mLs Intravenous Contrast Given 11/05/20 1516)    ED Course  I have reviewed the triage vital signs and the nursing notes.  Pertinent labs & imaging results that were available during my care of the patient were reviewed by me and considered in my medical decision making (see chart for details).  Clinical Course as of 11/05/20 1634  Wed Nov 05, 2020  1339 I was directly involved in this patients medical care.  [JH]    Clinical Course User Index [JH] Almyra Free Greggory Brandy, MD   MDM Rules/Calculators/A&P                          Pt presenting for evaluation of abd bruising. On exam, pt appears nontoxic. He is on warfarin, will need INR checked.  Consider diverticular bleed.  Consider abd trauma. Will order labs and ct abd. discussed with attending, Dr. Almyra Free evaluated the patient.  Labs interpreted by me, stable mild anemia at 11.5.  INR is elevated at 6.5.  This is likely the cause of bleeding.  Electrolytes otherwise reassuring.  CT pending.  CT abdomen pelvis shows abdominal wall hematoma, no active bleeding noted.  Vitals remained stable for the ER visit.  Patient does have small blood in his UA, but no urinary symptoms.  Discussed findings with patient and daughter.  Discussed holding warfarin with recheck of INR, appointment is already set for Monday, 4 days from now.  Discussed close monitoring of spontaneous bleeding, prompt return to the ER for evaluation if there is any trauma.  At this time, patient appears safe for discharge.  Return precautions given.  Patient states he understands and agrees to plan.  Final Clinical Impression(s) / ED  Diagnoses Final diagnoses:  Abdominal wall hematoma, initial encounter  Supratherapeutic INR    Rx / DC Orders ED Discharge Orders    None       Franchot Heidelberg, PA-C 11/05/20 1634    Luna Fuse, MD 11/05/20 2251

## 2020-11-05 NOTE — Telephone Encounter (Signed)
Pt was at the ED this morning, please advise

## 2020-11-05 NOTE — ED Triage Notes (Addendum)
Pt BIB EMS from home with c/o abnormal bruising. Pt was undressing and found bruising all over the abdomen. Pt denies any pain and denies any injuries. Patient denies pain and does take a blood thinner.  BP- 123/70  HR- 88 RR-18 96% room air.

## 2020-11-05 NOTE — Discharge Instructions (Addendum)
Do not take your warfarin until cleared by your doctor. Continue taking all other home medications as prescribed. It is important you follow-up for recheck of your INR in 5 days.  This can be done at your INR clinic, however if you are unable to get in with them you may follow-up with urgent care or back in the ER for recheck.  Return to the emergency room immediately if you develop severe sudden headache, bleeding from your gums, severe stomach pain or worsening bruising, or any new, worsening, or concerning symptoms

## 2020-11-06 ENCOUNTER — Other Ambulatory Visit (HOSPITAL_BASED_OUTPATIENT_CLINIC_OR_DEPARTMENT_OTHER): Payer: Self-pay

## 2020-11-06 NOTE — Telephone Encounter (Signed)
Spoke with Will Occupation Therapist with [facility], advised that pt verbal orders for OT have been approved by Dr Sarajane Jews, verbalized understanding

## 2020-11-06 NOTE — Telephone Encounter (Signed)
LVM about message, or call back to discuss

## 2020-11-07 ENCOUNTER — Other Ambulatory Visit: Payer: Self-pay

## 2020-11-07 ENCOUNTER — Other Ambulatory Visit (HOSPITAL_BASED_OUTPATIENT_CLINIC_OR_DEPARTMENT_OTHER): Payer: Self-pay

## 2020-11-07 MED ORDER — OXYBUTYNIN CHLORIDE ER 5 MG PO TB24: 5.0000 mg | ORAL_TABLET | Freq: Every day | ORAL | 3 refills | Status: DC

## 2020-11-10 ENCOUNTER — Other Ambulatory Visit: Payer: Self-pay

## 2020-11-10 ENCOUNTER — Ambulatory Visit (INDEPENDENT_AMBULATORY_CARE_PROVIDER_SITE_OTHER): Payer: Medicare Other | Admitting: General Practice

## 2020-11-10 DIAGNOSIS — I4891 Unspecified atrial fibrillation: Secondary | ICD-10-CM | POA: Diagnosis not present

## 2020-11-10 LAB — POCT INR: INR: 1.1 — AB (ref 2.0–3.0)

## 2020-11-10 NOTE — Patient Instructions (Addendum)
Pre visit review using our clinic review tool, if applicable. No additional management support is needed unless otherwise documented below in the visit note.  Take 1 (5 mg) tablet daily.  Re-check in 1 week.     

## 2020-11-11 DIAGNOSIS — E119 Type 2 diabetes mellitus without complications: Secondary | ICD-10-CM | POA: Diagnosis not present

## 2020-11-11 DIAGNOSIS — R55 Syncope and collapse: Secondary | ICD-10-CM | POA: Diagnosis not present

## 2020-11-11 DIAGNOSIS — Z7901 Long term (current) use of anticoagulants: Secondary | ICD-10-CM | POA: Diagnosis not present

## 2020-11-11 DIAGNOSIS — I482 Chronic atrial fibrillation, unspecified: Secondary | ICD-10-CM | POA: Diagnosis not present

## 2020-11-11 DIAGNOSIS — Z950 Presence of cardiac pacemaker: Secondary | ICD-10-CM | POA: Diagnosis not present

## 2020-11-11 DIAGNOSIS — E860 Dehydration: Secondary | ICD-10-CM | POA: Diagnosis not present

## 2020-11-11 DIAGNOSIS — I2510 Atherosclerotic heart disease of native coronary artery without angina pectoris: Secondary | ICD-10-CM | POA: Diagnosis not present

## 2020-11-11 DIAGNOSIS — U071 COVID-19: Secondary | ICD-10-CM | POA: Diagnosis not present

## 2020-11-12 ENCOUNTER — Other Ambulatory Visit (HOSPITAL_BASED_OUTPATIENT_CLINIC_OR_DEPARTMENT_OTHER): Payer: Self-pay

## 2020-11-17 ENCOUNTER — Ambulatory Visit (INDEPENDENT_AMBULATORY_CARE_PROVIDER_SITE_OTHER): Payer: Medicare Other | Admitting: General Practice

## 2020-11-17 ENCOUNTER — Other Ambulatory Visit: Payer: Self-pay

## 2020-11-17 DIAGNOSIS — I4891 Unspecified atrial fibrillation: Secondary | ICD-10-CM | POA: Diagnosis not present

## 2020-11-17 LAB — POCT INR: INR: 2.4 (ref 2.0–3.0)

## 2020-11-17 NOTE — Patient Instructions (Addendum)
Pre visit review using our clinic review tool, if applicable. No additional management support is needed unless otherwise documented below in the visit note.  Take 1 (5 mg) tablet daily except 1/2 tablet (2.5 mg) on Wednesdays.   Re-check in 4 week.

## 2020-11-18 DIAGNOSIS — Z950 Presence of cardiac pacemaker: Secondary | ICD-10-CM | POA: Diagnosis not present

## 2020-11-18 DIAGNOSIS — R55 Syncope and collapse: Secondary | ICD-10-CM | POA: Diagnosis not present

## 2020-11-18 DIAGNOSIS — U071 COVID-19: Secondary | ICD-10-CM | POA: Diagnosis not present

## 2020-11-18 DIAGNOSIS — I482 Chronic atrial fibrillation, unspecified: Secondary | ICD-10-CM | POA: Diagnosis not present

## 2020-11-18 DIAGNOSIS — I2510 Atherosclerotic heart disease of native coronary artery without angina pectoris: Secondary | ICD-10-CM | POA: Diagnosis not present

## 2020-11-18 DIAGNOSIS — E119 Type 2 diabetes mellitus without complications: Secondary | ICD-10-CM | POA: Diagnosis not present

## 2020-11-18 DIAGNOSIS — Z7901 Long term (current) use of anticoagulants: Secondary | ICD-10-CM | POA: Diagnosis not present

## 2020-11-18 DIAGNOSIS — E860 Dehydration: Secondary | ICD-10-CM | POA: Diagnosis not present

## 2020-11-20 ENCOUNTER — Other Ambulatory Visit (HOSPITAL_BASED_OUTPATIENT_CLINIC_OR_DEPARTMENT_OTHER): Payer: Self-pay

## 2020-11-20 DIAGNOSIS — Z950 Presence of cardiac pacemaker: Secondary | ICD-10-CM | POA: Diagnosis not present

## 2020-11-20 DIAGNOSIS — I482 Chronic atrial fibrillation, unspecified: Secondary | ICD-10-CM | POA: Diagnosis not present

## 2020-11-20 DIAGNOSIS — R55 Syncope and collapse: Secondary | ICD-10-CM | POA: Diagnosis not present

## 2020-11-20 DIAGNOSIS — U071 COVID-19: Secondary | ICD-10-CM | POA: Diagnosis not present

## 2020-11-20 DIAGNOSIS — E860 Dehydration: Secondary | ICD-10-CM | POA: Diagnosis not present

## 2020-11-20 DIAGNOSIS — E119 Type 2 diabetes mellitus without complications: Secondary | ICD-10-CM | POA: Diagnosis not present

## 2020-11-20 DIAGNOSIS — I2510 Atherosclerotic heart disease of native coronary artery without angina pectoris: Secondary | ICD-10-CM | POA: Diagnosis not present

## 2020-11-20 DIAGNOSIS — Z7901 Long term (current) use of anticoagulants: Secondary | ICD-10-CM | POA: Diagnosis not present

## 2020-11-25 ENCOUNTER — Telehealth: Payer: Self-pay | Admitting: Pharmacist

## 2020-11-25 NOTE — Chronic Care Management (AMB) (Signed)
I left the patient a message about his upcoming appointment on 11/26/2020 @ 1:00 pm with the clinical pharmacist. He was asked to please have all medication on hand to review with the pharmacist.  Neita Goodnight) Mare Ferrari, [place] Assistant [phone]

## 2020-11-26 ENCOUNTER — Other Ambulatory Visit: Payer: Self-pay

## 2020-11-26 ENCOUNTER — Ambulatory Visit (INDEPENDENT_AMBULATORY_CARE_PROVIDER_SITE_OTHER): Payer: Medicare Other | Admitting: Pharmacist

## 2020-11-26 DIAGNOSIS — E1169 Type 2 diabetes mellitus with other specified complication: Secondary | ICD-10-CM | POA: Diagnosis not present

## 2020-11-26 DIAGNOSIS — I4891 Unspecified atrial fibrillation: Secondary | ICD-10-CM | POA: Diagnosis not present

## 2020-11-26 DIAGNOSIS — E785 Hyperlipidemia, unspecified: Secondary | ICD-10-CM

## 2020-11-26 DIAGNOSIS — I10 Essential (primary) hypertension: Secondary | ICD-10-CM | POA: Diagnosis not present

## 2020-11-26 NOTE — Progress Notes (Signed)
Chronic Care Management Pharmacy Note  11/28/2020 Name:  Tanner Hensley MRN:  [id number] DOB:  Aug 07, 1941  Subjective: Tanner Hensley is an 80 y.o. year old male who is a primary patient of Laurey Morale, MD.  The CCM team was consulted for assistance with disease management and care coordination needs.    Engaged with patient face to face for follow up visit in response to provider referral for pharmacy case management and/or care coordination services.   Consent to Services:  The patient was given information about Chronic Care Management services, agreed to services, and gave verbal consent prior to initiation of services.  Please see initial visit note for detailed documentation.   Patient Care Team: Laurey Morale, MD as PCP - General (Family Medicine) Ebbie Ridge, MD as Referring Physician (Cardiology) Germaine Pomfret, [facility] as Pharmacist (Pharmacist)  Recent office visits: 11/17/20 [name], RN: Patient presented for anti-coag visit. INR 2.4, goal 2-3. Continued 2.5 mg (5 mg x 0.5) every Wed; 5 mg (5 mg x 1) all other days and follow up in 1 month.  11/03/20 Alysia Penna, MD: Patient presented for follow up for COVID infection. Increased benzonatate to 200 mg PRN.  10/15/20 Alysia Penna, MD: Patient presented for video visit for COVID infection. Prescribed Molnupiravir.  09/24/20 Ofilia Neas, LPN: Patient presented for AWV.  06/24/20 Alysia Penna, MD: Patient presented for annual exam. No medication changes made. Recommended therapy for memory loss with wife and administered influenza vaccine.  Recent consult visits: 03/17/20: Patient presented to Dr. Ola Spurr (Cardiology) for A-Fib Follow-up. No medication changes made.   Hospital visits: 11/05/20 Patient presented to the ED with bruising on his abdomen.   2/19-2/22/22 Patient admitted for COVID infection.  10/23/20 Patient presented to the ED with near syncope and COVID infection.  Objective:  Lab Results   Component Value Date   CREATININE 1.17 11/05/2020   BUN 13 11/05/2020   GFR 72.11 06/21/2019   GFRNONAA >60 11/05/2020   GFRAA >60 10/16/2016   NA 141 11/05/2020   K 4.2 11/05/2020   CALCIUM 9.4 11/05/2020   CO2 27 11/05/2020   GLUCOSE 129 (H) 11/05/2020    Lab Results  Component Value Date/Time   HGBA1C 5.6 06/24/2020 11:04 AM   HGBA1C 5.7 06/21/2019 11:57 AM   GFR 72.11 06/21/2019 11:57 AM   GFR 61.70 04/06/2018 09:50 AM   MICROALBUR 1.3 08/09/2014 10:27 AM   MICROALBUR 2.4 (H) 07/13/2010 10:58 AM    Last diabetic Eye exam: No results found for: HMDIABEYEEXA  Last diabetic Foot exam: No results found for: HMDIABFOOTEX   Lab Results  Component Value Date   CHOL 192 06/24/2020   HDL 49 06/24/2020   LDLCALC 119 (H) 06/24/2020   LDLDIRECT 176.1 07/20/2012   TRIG 129 06/24/2020   CHOLHDL 3.9 06/24/2020    Hepatic Function Latest Ref Rng & Units 11/05/2020 10/28/2020 10/27/2020  Total Protein 6.5 - 8.1 g/dL 6.3(L) 5.5(L) 5.3(L)  Albumin 3.5 - 5.0 g/dL 3.6 3.2(L) 3.2(L)  AST 15 - 41 U/L 31 64(H) 51(H)  ALT 0 - 44 U/L '26 24 21  ' Alk Phosphatase 38 - 126 U/L 60 48 46  Total Bilirubin 0.3 - 1.2 mg/dL 1.1 0.8 0.7  Bilirubin, Direct 0.0 - 0.2 mg/dL 0.1 - -    Lab Results  Component Value Date/Time   TSH 2.68 06/24/2020 11:04 AM   TSH 1.74 06/21/2019 11:57 AM    CBC Latest Ref Rng & Units 11/05/2020 10/28/2020  10/27/2020  WBC 4.0 - 10.5 K/uL 7.2 3.7(L) 3.7(L)  Hemoglobin 13.0 - 17.0 g/dL 11.5(L) 11.8(L) 11.9(L)  Hematocrit 39.0 - 52.0 % 34.4(L) 36.6(L) 35.3(L)  Platelets 150 - 400 K/uL 213 91(L) 91(L)    No results found for: VD25OH  Clinical ASCVD: Yes  The ASCVD Risk score Mikey Bussing DC Jr., et al., 2013) failed to calculate for the following reasons:   The 2013 ASCVD risk score is only valid for ages 30 to 85    Depression screen PHQ 2/9 09/24/2020 06/24/2020 06/24/2020  Decreased Interest 0 0 0  Down, Depressed, Hopeless 0 0 0  PHQ - 2 Score 0 0 0  Altered sleeping - 0  -  Tired, decreased energy - 0 -  Change in appetite - 0 -  Feeling bad or failure about yourself  - 0 -  Trouble concentrating - 0 -  Moving slowly or fidgety/restless - 0 -  Suicidal thoughts - 0 -  PHQ-9 Score - 0 -  Some recent data might be hidden     CHA2DS2/VAS Stroke Risk Points  Current as of just now     5 >= 2 Points: High Risk  1 - 1.99 Points: Medium Risk  0 Points: Low Risk    Last Change: N/A      Details    This score determines the patient's risk of having a stroke if the  patient has atrial fibrillation.       Points Metrics  0 Has Congestive Heart Failure:  No    Current as of just now  1 Has Vascular Disease:  Yes    Current as of just now  1 Has Hypertension:  Yes    Current as of just now  2 Age:  63    Current as of just now  1 Has Diabetes:  Yes    Current as of just now  0 Had Stroke:  No  Had TIA:  No  Had Thromboembolism:  No    Current as of just now  0 Male:  No    Current as of just now    Social History   Tobacco Use  Smoking Status Never Smoker  Smokeless Tobacco Never Used   BP Readings from Last 3 Encounters:  11/05/20 (!) 160/66  10/28/20 132/68  10/23/20 (!) 101/56   Pulse Readings from Last 3 Encounters:  11/05/20 60  10/28/20 (!) 58  10/23/20 60   Wt Readings from Last 3 Encounters:  11/05/20 167 lb 8.8 oz (76 kg)  11/03/20 167 lb (75.8 kg)  10/28/20 167 lb 12.3 oz (76.1 kg)   BMI Readings from Last 3 Encounters:  11/05/20 27.88 kg/m  11/03/20 27.79 kg/m  10/28/20 27.92 kg/m    Assessment/Interventions: Review of patient past medical history, allergies, medications, health status, including review of consultants reports, laboratory and other test data, was performed as part of comprehensive evaluation and provision of chronic care management services.   SDOH:  (Social Determinants of Health) assessments and interventions performed: No   CCM Care Plan  Allergies  Allergen Reactions  . Codeine Rash and  Other (See Comments)    Dizziness, low blood pressure and heart rate  . Hydrocodone-Acetaminophen Rash and Other (See Comments)    Dizziness, low blood pressure and heart rate  . Tramadol Nausea And Vomiting  . Lipitor [Atorvastatin] Other (See Comments)    Myalgias   . Robaxin [Methocarbamol] Other (See Comments)    Dropped blood pressure too much  .  Zocor [Simvastatin] Other (See Comments)    myalgias    Medications Reviewed Today    Reviewed by Laurey Morale, MD (Physician) on 11/03/20 at [place] List Status: <None>  Medication Order Taking? Sig Documenting Provider Last Dose Status Informant  acetaminophen (TYLENOL) 500 MG tablet [id number] Yes Take 1,000 mg by mouth every 4 (four) hours as needed for mild pain. [provider] Taking Active Spouse/Significant Other           Med Note Daymon Larsen   Sat Oct 25, 2020  1:44 PM) For covid treatment.  alendronate (FOSAMAX) 70 MG tablet [id number] Yes TAKE 1 TABLET BY MOUTH  WEEKLY WITH 8 OUNCE OF  PLAIN WATER 1/2 HOUR BEFORE FIRST FOOD DRINK OR MEDS.  STAY UPRIGHT FOR 1/2 HOUR  Patient taking differently: Take 70 mg by mouth every Friday.   Laurey Morale, MD Taking Active   ascorbic acid (VITAMIN C) 500 MG tablet [id number] Yes Take 500 mg by mouth daily. [provider] Taking Active Spouse/Significant Other  aspirin EC 81 MG tablet [id number] Yes Take 81 mg by mouth daily. [provider] Taking Active Spouse/Significant Other  atenolol (TENORMIN) 25 MG tablet [id number] Yes Take 25 mg by mouth daily. [provider] Taking Active Spouse/Significant Other  b complex vitamins tablet [id number] Yes Take 1 tablet by mouth daily. [provider] Taking Active Spouse/Significant Other  benzonatate (TESSALON) 100 MG capsule [id number] Yes Take 1 capsule (100 mg total) by mouth every 6 (six) hours as needed for cough.  Patient taking differently: Take 100 mg by mouth every 8 (eight) hours as needed  for cough.   Laurey Morale, MD Taking Active   Biotin 1000 MCG tablet [id number] Yes Take 5,000 mcg by mouth daily.  [provider] Taking Active Spouse/Significant Other  calcium citrate-vitamin D (CITRACAL+D) 315-200 MG-UNIT tablet [id number] Yes Take 1 tablet by mouth 2 (two) times daily. [provider] Taking Active Spouse/Significant Other           Med Note Daymon Larsen   Sat Oct 25, 2020  1:40 PM)    dextromethorphan-guaiFENesin [facility] DM) 30-600 MG 12hr tablet [id number] Yes Take 1 tablet by mouth 2 (two) times daily for 7 days. Alma Friendly, MD Taking Active   donepezil (ARICEPT) 10 MG tablet [id number] Yes TAKE 1 TABLET BY MOUTH AT  BEDTIME  Patient taking differently: Take 10 mg by mouth at bedtime.   Laurey Morale, MD Taking Active   memantine [facility]) 10 MG tablet [id number] Yes TAKE 1 TABLET BY MOUTH  TWICE DAILY  Patient taking differently: Take 10 mg by mouth 2 (two) times daily.   Laurey Morale, MD Taking Active   Multiple Vitamin (MULTIVITAMIN WITH MINERALS) TABS tablet [id number] Yes Take 1 tablet by mouth daily. SENIOR MULTIVITAMIN [provider] Taking Active Spouse/Significant Other  olmesartan (BENICAR) 20 MG tablet [id number] Yes TAKE 1 TABLET BY MOUTH  DAILY  Patient taking differently: Take 20 mg by mouth daily.   Laurey Morale, MD Taking Active   ondansetron (ZOFRAN ODT) 4 MG disintegrating tablet [id number] Yes 7m ODT q4 hours prn nausea/vomit  Patient taking differently: Take 4 mg by mouth every 4 (four) hours as needed for nausea or vomiting.   FDeno Etienne DO Taking Active   oxybutynin (DITROPAN-XL) 5 MG 24 hr tablet [id number] Take 1 tablet (5 mg total) by mouth at bedtime. FLaurey Morale MD Taking Active Spouse/Significant Other  warfarin (COUMADIN) 5 MG tablet [id number] Yes TAKE 1 TABLET BY MOUTH  DAILY EXCEPT 1.5 TABLETS ON WEDNESDAY AND SATURDAY OR  AS DIRECTED BY  ANTICOAGULATION CLINIC  Patient taking  differently: Take 2.5-5 mg by mouth See admin instructions. Take 65m every day except Wednesday (2.544m   FrLaurey MoraleMD Taking Active            Med Note (CDaymon Larsen SaGUReb 19, 2022  1:50 PM) Regimen confirmed per wife.          Patient Active Problem List   Diagnosis Date Noted  . Macrocytic anemia 10/26/2020  . COVID-19 virus infection 10/15/2020  . Arthritis 02/13/2019  . Pulmonary infiltrates on CXR 06/21/2018  . Upper airway cough syndrome 06/20/2018  . Type 2 diabetes mellitus with other specified complication ([id number]. Lower extremity weakness   . Radiculopathy 10/13/2016  . Syncope 10/13/2016  . CAD (coronary artery disease) 10/13/2016  . Memory loss 02/22/2014  . 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 KNEE SPRAIN 11/10/2009  . INGUINAL HERNIA 09/17/2009  . CHEST WALL PAIN, ACUTE 09/17/2009  . Dyslipidemia 09/13/2008  . OVERACTIVE BLADDER 01/26/2008  . 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 Essential hypertension 05/22/2007  . GERD 05/22/2007  . ATRIAL FIBRILLATION, CHRONIC 05/01/2007    Immunization History  Administered Date(s) Administered  . Fluad Quad(high Dose 65+) 06/15/2019, 06/24/2020  . Influenza Split 06/07/2011, 06/21/2012  . Influenza Whole 07/07/2005, 06/09/2007, 06/07/2008, 06/18/2009, 07/13/2010  . Influenza, High Dose Seasonal PF 06/15/2016, 06/23/2017, 07/24/2018  . Influenza,inj,Quad PF,6+ Mos 07/26/2013, 05/20/2014, 04/28/2015  . Influenza-Unspecified 06/07/2011, 06/21/2012, 07/26/2013, 05/20/2014, 04/28/2015, 06/15/2016, 06/23/2017, 07/24/2018  . Pneumococcal Conjugate-13 08/09/2014  . Pneumococcal Polysaccharide-23 07/20/2012  . Pneumococcal-Unspecified 07/20/2012, 08/09/2014  . Td 07/13/2010  . Tdap 07/13/2010     Conditions to be addressed/monitored:  Hypertension, Hyperlipidemia, Diabetes, Atrial Fibrillation, Coronary Artery Disease, GERD, Osteoporosis, Osteoarthritis,  Overactive Bladder and BPH    Care Plan : CCWindsor PlaceUpdates made by PrViona GilmoreRPHastings-on-Hudsonince 11/28/2020 12:00 AM    Problem: Problem: Hypertension, Hyperlipidemia, Diabetes, Atrial Fibrillation, Coronary Artery Disease, GERD, Osteoporosis, Osteoarthritis, Overactive Bladder and BPH     Long-Range Goal: Patient-Specific Goal   Start Date: 11/26/2020  Expected End Date: 11/26/2021  This Visit's Progress: On track  Priority: High  Note:   Current Barriers:  . Unable to independently monitor therapeutic efficacy  Pharmacist Clinical Goal(s):  . Marland Kitchenatient will achieve adherence to monitoring guidelines and medication adherence to achieve therapeutic efficacy through collaboration with PharmD and provider.   Interventions: . 1:1 collaboration with FrLaurey MoraleMD regarding development and update of comprehensive plan of care as evidenced by provider attestation and co-signature . Inter-disciplinary care team collaboration (see longitudinal plan of care) . Comprehensive medication review performed; medication list updated in electronic medical record  Hypertension (BP goal <140/90) -Controlled -Current treatment:  Atenolol 25 mg daily - in AM  Olmesartan 20 mg daily - in AM -Medications previously tried: n/a -Current home readings: 74/53 (lowest), 120/73, 125/78, highest 148 -Current dietary habits: did not discuss -Current exercise habits: limited with recovering from hospitalizations -Reports hypotensive/hypertensive symptoms -Educated on Exercise goal of 150 minutes per week; Importance of home blood pressure monitoring; Proper BP monitoring technique; -Counseled to monitor BP at home weekly, document, and provide log at future appointments -Counseled on diet and exercise extensively Recommended to continue current medication  Hyperlipidemia: (LDL goal <  70) -Uncontrolled -Current treatment: . No medications -Medications previously tried: Simvastatin  (Myalgias), Atorvastatin (myalgias)   -Current dietary patterns: eating red meat once weekly and rarely eats out at restaurants -Current exercise habits: limited with recovering from hospitalizations -Educated on Cholesterol goals;  Importance of limiting foods high in cholesterol; Exercise goal of 150 minutes per week; -Counseled on diet and exercise extensively Recommended discussion with PCP about consideration of cholesterol medications  Atrial Fibrillation (Goal: prevent stroke and major bleeding) -Controlled -CHADSVASC: 5 -Current treatment:  Rate control: Atenolol 25 mg daily  Anticoagulation: Warfarin 5 mg 1.5 tablets Wed/Sat, 1 tablet all other days -Medications previously tried: n/a -Home BP and HR readings: 74/53 (lowest), 120/73, 125/78, highest 148 -Counseled on bleeding risk associated with warfarin and importance of self-monitoring for signs/symptoms of bleeding; avoidance of NSAIDs due to increased bleeding risk with anticoagulants; -Recommended to continue current medication Discussed bleed risk with taking both aspirin and warfarin and will plan to address with cardiology  Osteoporosis (Goal prevent fractures) -Controlled -Last DEXA Scan: 10/30/15              T-Score femoral neck: -3.0             T-Score total hip: -3.0             T-Score lumbar spine: +0.0             T-Score forearm radius: n/a             10-year probability of major osteoporotic fracture: n/a             10-year probability of hip fracture: n/a -Patient is a candidate for pharmacologic treatment due to T-Score < -2.5 in femoral neck and T-Score < -2.5 in total hip  -Current treatment   Alendronate 70 mg weekly (Started 06/07/11)   Calcium Citrate + Vit D 315-200 mg twice daily -Medications previously tried: none  -Recommend [phone] units of vitamin D daily. Recommend 1200 mg of calcium daily from dietary and supplemental sources. Recommend weight-bearing and muscle strengthening  exercises for building and maintaining bone density. -Counseled on diet and exercise extensively Recommended to continue current medication  Memory loss (Goal: maintain memory function) -Controlled -Current treatment   Donepezil 10 mg at bedtime  Memantine 10 mg twice daily -Medications previously tried: none  -Recommended to continue current medication   BPH/Overactive bladder (Goal: minimize symptoms) -Controlled -Current treatment  . Oxybutynin ER 5 mg 1 tablet daily -Medications previously tried: Oxybutynin (cost), solifenacin (cost)   -Recommended to continue current medication Patient noted a major difference and a huge improvement  CAD (Goal: prevent heart events/strokes) -Controlled -Current treatment  . Aspirin 81 mg 1 tablet daily -Medications previously tried: none  -Counseled on risk for bleeding while taking both warfarin and aspirin   Health Maintenance -Vaccine gaps: shingles, COVID vaccine (once out of 90 day window) -Current therapy:   APAP 500 mg 2 tablets QHS   Vitamin C 500 mg   Vitamin B Complex daily   Biotin 5000 mcg daily   Multivitamin (Senior Multivitamin) daily   Zinc 50 mg 1 tablet daily  -Educated on Cost vs benefit of each product must be carefully weighed by individual consumer -Patient is satisfied with current therapy and denies issues -Recommended to continue current medication  Patient Goals/Self-Care Activities . Patient will:  - take medications as prescribed check blood pressure weekly, document, and provide at future appointments target a minimum of 150 minutes of moderate intensity exercise weekly  Follow Up Plan: Face to Face appointment with care management team member scheduled for:  6 months       Medication Assistance: None required.  Patient affirms current coverage meets needs.  Patient's preferred pharmacy is:  [facility], [address], [address] [place],  [place] [address] Phone: [phone] Fax: [phone]  [id number] [place], [address]: [phone]: [address] [place]Phone: [phone]: [phone] Uses pill box? Yes Pt endorses 100% compliance  We discussed: Current pharmacy is preferred with insurance plan and patient is satisfied with pharmacy services Patient decided to: Continue current medication management strategy  Care Plan and Follow Up Patient Decision:  Patient agrees to Care Plan and Follow-up.  Plan: Face to Face appointment with care management team member scheduled for: 6 months  MJeni Salles PharmD BBoydsPharmacist LScioat [phone]

## 2020-11-28 ENCOUNTER — Other Ambulatory Visit: Payer: Self-pay

## 2020-11-28 NOTE — Patient Instructions (Addendum)
Hi Tanner Hensley,  It was great to get to meet you in person! Below is a summary of some of the topics we discussed.   Please reach out to me if you have any questions or need anything before our follow up!  Best, Maddie  Jeni Salles, PharmD, [facility]  Visit Information  Goals Addressed   None    Patient Care Plan: CCM Pharmacy Care Plan    Problem Identified: Problem: Hypertension, Hyperlipidemia, Diabetes, Atrial Fibrillation, Coronary Artery Disease, GERD, Osteoporosis, Osteoarthritis, Overactive Bladder and BPH     Long-Range Goal: Patient-Specific Goal   Start Date: 11/26/2020  Expected End Date: 11/26/2021  This Visit's Progress: On track  Priority: High  Note:   Current Barriers:  . Unable to independently monitor therapeutic efficacy  Pharmacist Clinical Goal(s):  Marland Kitchen Patient will achieve adherence to monitoring guidelines and medication adherence to achieve therapeutic efficacy through collaboration with PharmD and provider.   Interventions: . 1:1 collaboration with Laurey Morale, MD regarding development and update of comprehensive plan of care as evidenced by provider attestation and co-signature . Inter-disciplinary care team collaboration (see longitudinal plan of care) . Comprehensive medication review performed; medication list updated in electronic medical record  Hypertension (BP goal <140/90) -Controlled -Current treatment:  Atenolol 25 mg daily - in AM  Olmesartan 20 mg daily - in AM -Medications previously tried: n/a -Current home readings: 74/53 (lowest), 120/73, 125/78, highest 148 -Current dietary habits: did not discuss -Current exercise habits: limited with recovering from hospitalizations -Reports hypotensive/hypertensive symptoms -Educated on Exercise goal of 150 minutes per week; Importance of home blood pressure monitoring; Proper BP monitoring technique; -Counseled to monitor BP  at home weekly, document, and provide log at future appointments -Counseled on diet and exercise extensively Recommended to continue current medication  Hyperlipidemia: (LDL goal < 70) -Uncontrolled -Current treatment: . No medications -Medications previously tried: Simvastatin (Myalgias), Atorvastatin (myalgias)   -Current dietary patterns: eating red meat once weekly and rarely eats out at restaurants -Current exercise habits: limited with recovering from hospitalizations -Educated on Cholesterol goals;  Importance of limiting foods high in cholesterol; Exercise goal of 150 minutes per week; -Counseled on diet and exercise extensively Recommended discussion with PCP about consideration of cholesterol medications  Atrial Fibrillation (Goal: prevent stroke and major bleeding) -Controlled -CHADSVASC: 5 -Current treatment:  Rate control: Atenolol 25 mg daily  Anticoagulation: Warfarin 5 mg 1.5 tablets Wed/Sat, 1 tablet all other days -Medications previously tried: n/a -Home BP and HR readings: 74/53 (lowest), 120/73, 125/78, highest 148 -Counseled on bleeding risk associated with warfarin and importance of self-monitoring for signs/symptoms of bleeding; avoidance of NSAIDs due to increased bleeding risk with anticoagulants; -Recommended to continue current medication Discussed bleed risk with taking both aspirin and warfarin and will plan to address with cardiology  Osteoporosis (Goal prevent fractures) -Controlled -Last DEXA Scan: 10/30/15              T-Score femoral neck: -3.0             T-Score total hip: -3.0             T-Score lumbar spine: +0.0             T-Score forearm radius: n/a             10-year probability of major osteoporotic fracture: n/a             10-year probability of hip fracture: n/a -Patient is a  candidate for pharmacologic treatment due to T-Score < -2.5 in femoral neck and T-Score < -2.5 in total hip  -Current treatment   Alendronate 70 mg  weekly (Started 06/07/11)   Calcium Citrate + Vit D 315-200 mg twice daily -Medications previously tried: none  -Recommend [phone] units of vitamin D daily. Recommend 1200 mg of calcium daily from dietary and supplemental sources. Recommend weight-bearing and muscle strengthening exercises for building and maintaining bone density. -Counseled on diet and exercise extensively Recommended to continue current medication  Memory loss (Goal: maintain memory function) -Controlled -Current treatment   Donepezil 10 mg at bedtime  Memantine 10 mg twice daily -Medications previously tried: none  -Recommended to continue current medication   BPH/Overactive bladder (Goal: minimize symptoms) -Controlled -Current treatment  . Oxybutynin ER 5 mg 1 tablet daily -Medications previously tried: Oxybutynin (cost), solifenacin (cost)   -Recommended to continue current medication Patient noted a major difference and a huge improvement  CAD (Goal: prevent heart events/strokes) -Controlled -Current treatment  . Aspirin 81 mg 1 tablet daily -Medications previously tried: none  -Counseled on risk for bleeding while taking both warfarin and aspirin   Health Maintenance -Vaccine gaps: shingles, COVID vaccine (once out of 90 day window) -Current therapy:   APAP 500 mg 2 tablets QHS   Vitamin C 500 mg   Vitamin B Complex daily   Biotin 5000 mcg daily   Multivitamin (Senior Multivitamin) daily   Zinc 50 mg 1 tablet daily  -Educated on Cost vs benefit of each product must be carefully weighed by individual consumer -Patient is satisfied with current therapy and denies issues -Recommended to continue current medication  Patient Goals/Self-Care Activities . Patient will:  - take medications as prescribed check blood pressure weekly, document, and provide at future appointments target a minimum of 150 minutes of moderate intensity exercise weekly  Follow Up Plan: Face to Face appointment  with care management team member scheduled for:  6 months       Patient verbalizes understanding of instructions provided today and agrees to view in [place].  Face to Face appointment with pharmacist scheduled for:  6 months  Viona Gilmore, [facility]  High Cholesterol  High cholesterol is a condition in which the blood has high levels of a white, waxy substance similar to fat (cholesterol). The liver makes all the cholesterol that the body needs. The human body needs small amounts of cholesterol to help build cells. A person gets extra or excess cholesterol from the food that he or she eats. The blood carries cholesterol from the liver to the rest of the body. If you have high cholesterol, deposits (plaques) may build up on the walls of your arteries. Arteries are the blood vessels that carry blood away from your heart. These plaques make the arteries narrow and stiff. Cholesterol plaques increase your risk for heart attack and stroke. Work with your health care provider to keep your cholesterol levels in a healthy range. What increases the risk? The following factors may make you more likely to develop this condition:  Eating foods that are high in animal fat (saturated fat) or cholesterol.  Being overweight.  Not getting enough exercise.  A family history of high cholesterol (familial hypercholesterolemia).  Use of tobacco products.  Having diabetes. What are the signs or symptoms? There are no symptoms of this condition. How is this diagnosed? This condition may be diagnosed based on the results of a blood test.  If you are older than 81 years  of age, your health care provider may check your cholesterol levels every 4-6 years.  You may be checked more often if you have high cholesterol or other risk factors for heart disease. The blood test for cholesterol measures:  "Bad" cholesterol, or LDL cholesterol. This is the main type of cholesterol that causes heart disease. The  desired level is less than 100 mg/dL.  "Good" cholesterol, or HDL cholesterol. HDL helps protect against heart disease by cleaning the arteries and carrying the LDL to the liver for processing. The desired level for HDL is 60 mg/dL or higher.  Triglycerides. These are fats that your body can store or burn for energy. The desired level is less than 150 mg/dL.  Total cholesterol. This measures the total amount of cholesterol in your blood and includes LDL, HDL, and triglycerides. The desired level is less than 200 mg/dL. How is this treated? This condition may be treated with:  Diet changes. You may be asked to eat foods that have more fiber and less saturated fats or added sugar.  Lifestyle changes. These may include regular exercise, maintaining a healthy weight, and quitting use of tobacco products.  Medicines. These are given when diet and lifestyle changes have not worked. You may be prescribed a statin medicine to help lower your cholesterol levels. Follow these instructions at home: Eating and drinking  Eat a healthy, balanced diet. This diet includes: ? Daily servings of a variety of fresh, frozen, or canned fruits and vegetables. ? Daily servings of whole grain foods that are rich in fiber. ? Foods that are low in saturated fats and trans fats. These include poultry and fish without skin, lean cuts of meat, and low-fat dairy products. ? A variety of fish, especially oily fish that contain omega-3 fatty acids. Aim to eat fish at least 2 times a week.  Avoid foods and drinks that have added sugar.  Use healthy cooking methods, such as roasting, grilling, broiling, baking, poaching, steaming, and stir-frying. Do not fry your food except for stir-frying.   Lifestyle  Get regular exercise. Aim to exercise for a total of 150 minutes a week. Increase your activity level by doing activities such as gardening, walking, and taking the stairs.  Do not use any products that contain nicotine  or tobacco, such as cigarettes, e-cigarettes, and chewing tobacco. If you need help quitting, ask your health care provider.   General instructions  Take over-the-counter and prescription medicines only as told by your health care provider.  Keep all follow-up visits as told by your health care provider. This is important. Where to find more information  American Heart Association: www.heart.org  National Heart, Lung, and Blood Institute: https://wilson-eaton.com/ Contact a health care provider if:  You have trouble achieving or maintaining a healthy diet or weight.  You are starting an exercise program.  You are unable to stop smoking. Get help right away if:  You have chest pain.  You have trouble breathing.  You have any symptoms of a stroke. "BE FAST" is an easy way to remember the main warning signs of a stroke: ? B - Balance. Signs are dizziness, sudden trouble walking, or loss of balance. ? E - Eyes. Signs are trouble seeing or a sudden change in vision. ? F - Face. Signs are sudden weakness or numbness of the face, or the face or eyelid drooping on one side. ? A - Arms. Signs are weakness or numbness in an arm. This happens suddenly and usually on one  side of the body. ? S - Speech. Signs are sudden trouble speaking, slurred speech, or trouble understanding what people say. ? T - Time. Time to call emergency services. Write down what time symptoms started.  You have other signs of a stroke, such as: ? A sudden, severe headache with no known cause. ? Nausea or vomiting. ? Seizure. These symptoms may represent a serious problem that is an emergency. Do not wait to see if the symptoms will go away. Get medical help right away. Call your local emergency services (911 in the [place] Do not drive yourself to the hospital. Summary  Cholesterol plaques increase your risk for heart attack and stroke. Work with your health care provider to keep your cholesterol levels in a healthy  range.  Eat a healthy, balanced diet, get regular exercise, and maintain a healthy weight.  Do not use any products that contain nicotine or tobacco, such as cigarettes, e-cigarettes, and chewing tobacco.  Get help right away if you have any symptoms of a stroke. This information is not intended to replace advice given to you by your health care provider. Make sure you discuss any questions you have with your health care provider. Document Revised: 07/23/2019 Document Reviewed: 07/23/2019 Elsevier Patient Education  2021 [place] protect organs, store calcium, anchor muscles, and support the whole body. Keeping your bones strong is important, especially as you get older. You can take actions to help keep your bones strong and healthy. Why is keeping my bones healthy important? Keeping your bones healthy is important because your body constantly replaces bone cells. Cells get old, and new cells take their place. As we age, we lose bone cells because the body may not be able to make enough new cells to replace the old cells. The amount of bone cells and bone tissue you have is referred to as bone mass. The higher your bone mass, the stronger your bones. The aging process leads to an overall loss of bone mass in the body, which can increase the likelihood of:  Joint pain and stiffness.  Broken bones.  A condition in which the bones become weak and brittle (osteoporosis). A large decline in bone mass occurs in older adults. In women, it occurs about the time of menopause.   What actions can I take to keep my bones healthy? Good health habits are important for maintaining healthy bones. This includes eating nutritious foods and exercising regularly. To have healthy bones, you need to get enough of the right minerals and vitamins. Most nutrition experts recommend getting these nutrients from the foods that you eat. In some cases, taking supplements may also be recommended.  Doing certain types of exercise is also important for bone health. What are the nutritional recommendations for healthy bones? Eating a well-balanced diet with plenty of calcium and vitamin D will help to protect your bones. Nutritional recommendations vary from person to person. Ask your health care provider what is healthy for you. Here are some general guidelines. Get enough calcium Calcium is the most important (essential) mineral for bone health. Most people can get enough calcium from their diet, but supplements may be recommended for people who are at risk for osteoporosis. Good sources of calcium include:  Dairy products, such as low-fat or nonfat milk, cheese, and yogurt.  Dark green leafy vegetables, such as bok choy and broccoli.  Calcium-fortified foods, such as orange juice, cereal, bread, soy beverages, and tofu products.  Nuts, such as  almonds. Follow these recommended amounts for daily calcium intake:  Children, age 31-3: 700 mg.  Children, age 61-8: 1,000 mg.  Children, age 75-13: 1,300 mg.  Teens, age 73-18: 1,300 mg.  Adults, age 88-50: 1,000 mg.  Adults, age 317-70: ? Men: 1,000 mg. ? Women: 1,200 mg.  Adults, age 50 or older: 1,200 mg.  Pregnant and breastfeeding females: ? Teens: 1,300 mg. ? Adults: 1,000 mg. Get enough vitamin D Vitamin D is the most essential vitamin for bone health. It helps the body absorb calcium. Sunlight stimulates the skin to make vitamin D, so be sure to get enough sunlight. If you live in a cold climate or you do not get outside often, your health care provider may recommend that you take vitamin D supplements. Good sources of vitamin D in your diet include:  Egg yolks.  Saltwater fish.  Milk and cereal fortified with vitamin D. Follow these recommended amounts for daily vitamin D intake:  Children and teens, age 31-18: 600 international units.  Adults, age 37 or younger: 400-800 international units.  Adults, age 61 or older:  800-1,000 international units. Get other important nutrients Other nutrients that are important for bone health include:  Phosphorus. This mineral is found in meat, poultry, dairy foods, nuts, and legumes. The recommended daily intake for adult men and adult women is 700 mg.  Magnesium. This mineral is found in seeds, nuts, dark green vegetables, and legumes. The recommended daily intake for adult men is 400-420 mg. For adult women, it is 310-320 mg.  Vitamin K. This vitamin is found in green leafy vegetables. The recommended daily intake is 120 mg for adult men and 90 mg for adult women.   What type of physical activity is best for building and maintaining healthy bones? Weight-bearing and strength-building activities are important for building and maintaining healthy bones. Weight-bearing activities cause muscles and bones to work against gravity. Strength-building activities increase the strength of the muscles that support bones. Weight-bearing and muscle-building activities include:  Walking and hiking.  Jogging and running.  Dancing.  Gym exercises.  Lifting weights.  Tennis and racquetball.  Climbing stairs.  Aerobics. Adults should get at least 30 minutes of moderate physical activity on most days. Children should get at least 60 minutes of moderate physical activity on most days. Ask your health care provider what type of exercise is best for you.   How can I find out if my bone mass is low? Bone mass can be measured with an X-ray test called a bone mineral density (BMD) test. This test is recommended for all women who are age 32 or older. It may also be recommended for:  Men who are age 55 or older.  People who are at risk for osteoporosis because of: ? Having bones that break easily. ? Having a long-term disease that weakens bones, such as kidney disease or rheumatoid arthritis. ? Having menopause earlier than normal. ? Taking medicine that weakens bones, such as  steroids, thyroid hormones, or hormone treatment for breast cancer or prostate cancer. ? Smoking. ? Drinking three or more alcoholic drinks a day. If you find that you have a low bone mass, you may be able to prevent osteoporosis or further bone loss by changing your diet and lifestyle. Where can I find more information? For more information, check out the following websites:  [place]: AviationTales.fr  [employer] of Health: www.bones.SouthExposed.es  International Osteoporosis Foundation: Administrator.iofbonehealth.org Summary  The aging process leads to an  overall loss of bone mass in the body, which can increase the likelihood of broken bones and osteoporosis.  Eating a well-balanced diet with plenty of calcium and vitamin D will help to protect your bones.  Weight-bearing and strength-building activities are also important for building and maintaining strong bones.  Bone mass can be measured with an X-ray test called a bone mineral density (BMD) test. This information is not intended to replace advice given to you by your health care provider. Make sure you discuss any questions you have with your health care provider. Document Revised: 09/19/2017 Document Reviewed: 09/19/2017 Elsevier Patient Education  2021 [employer].

## 2020-12-01 ENCOUNTER — Other Ambulatory Visit (HOSPITAL_BASED_OUTPATIENT_CLINIC_OR_DEPARTMENT_OTHER): Payer: Self-pay

## 2020-12-01 ENCOUNTER — Encounter: Payer: Self-pay | Admitting: Family Medicine

## 2020-12-01 ENCOUNTER — Other Ambulatory Visit: Payer: Self-pay

## 2020-12-01 ENCOUNTER — Ambulatory Visit (INDEPENDENT_AMBULATORY_CARE_PROVIDER_SITE_OTHER): Payer: Medicare Other

## 2020-12-01 ENCOUNTER — Ambulatory Visit (INDEPENDENT_AMBULATORY_CARE_PROVIDER_SITE_OTHER): Payer: Medicare Other | Admitting: Family Medicine

## 2020-12-01 VITALS — BP 110/60 | HR 60 | Temp 98.7°F | Wt 159.2 lb

## 2020-12-01 DIAGNOSIS — I4811 Longstanding persistent atrial fibrillation: Secondary | ICD-10-CM

## 2020-12-01 DIAGNOSIS — R0602 Shortness of breath: Secondary | ICD-10-CM | POA: Diagnosis not present

## 2020-12-01 DIAGNOSIS — I712 Thoracic aortic aneurysm, without rupture, unspecified: Secondary | ICD-10-CM

## 2020-12-01 DIAGNOSIS — U071 COVID-19: Secondary | ICD-10-CM

## 2020-12-01 DIAGNOSIS — I10 Essential (primary) hypertension: Secondary | ICD-10-CM

## 2020-12-01 NOTE — Progress Notes (Signed)
   Subjective:    Patient ID: Tanner Hensley, male    DOB: 10-11-1940, 80 y.o.   MRN: [id number]  HPI Here to follow up a hospital stay from 10-25-20 to 10-28-20 for Covid pneumonia. He was treated with steroids and antivirals, and he has made a slow recovery since then. He still has a lot of fatigue and he still has a dry cough. Also on 11-05-20 he went to the ED for bruising on the abdomen. His INR was elevated to 6.5 and he had a large hematoma oin the abdominal wall. His Coumadin was adjusted. 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    Review of Systems  Constitutional: Positive for fatigue.  HENT: Negative.   Eyes: Negative.   Respiratory: Positive for cough and wheezing. Negative for shortness of breath.   Cardiovascular: Negative.        Objective:   Physical Exam Constitutional:      Appearance: Normal appearance. He is not ill-appearing.  Cardiovascular:     Rate and Rhythm: Normal rate. Rhythm irregular.     Pulses: Normal pulses.     Heart sounds: Normal heart sounds.  Pulmonary:     Effort: Pulmonary effort is normal. No respiratory distress.     Breath sounds: No stridor. No rales.     Comments: Scattered soft wheezes and rhonchi  Neurological:     Mental Status: He is alert.           Assessment & Plan:  He is slowly recovering from Covid pneumonia, although he still has weakness and a cough. We will get a CXR today. He will follow up with the Coumadin clinic to monitor his INR. He has a moderate thoracic aortic aneurysm, and we plan to follow thi with yearly CT scans.  Alysia Penna, MD

## 2020-12-03 ENCOUNTER — Other Ambulatory Visit (HOSPITAL_BASED_OUTPATIENT_CLINIC_OR_DEPARTMENT_OTHER): Payer: Self-pay

## 2020-12-03 ENCOUNTER — Other Ambulatory Visit (HOSPITAL_COMMUNITY): Payer: Self-pay

## 2020-12-15 ENCOUNTER — Other Ambulatory Visit: Payer: Self-pay

## 2020-12-15 ENCOUNTER — Ambulatory Visit (INDEPENDENT_AMBULATORY_CARE_PROVIDER_SITE_OTHER): Payer: Medicare Other | Admitting: General Practice

## 2020-12-15 DIAGNOSIS — I4891 Unspecified atrial fibrillation: Secondary | ICD-10-CM | POA: Diagnosis not present

## 2020-12-15 LAB — POCT INR: INR: 3.9 — AB (ref 2.0–3.0)

## 2020-12-15 NOTE — Patient Instructions (Signed)
Pre visit review using our clinic review tool, if applicable. No additional management support is needed unless otherwise documented below in the visit note.  Skip coumadin tomorrow (4/12) and then change dosage and take Take 1 (5 mg) tablet daily except 1/2 tablet (2.5 mg) on Wednesdays and Saturdays.   Re-check in 3 weeks.

## 2020-12-16 DIAGNOSIS — M79642 Pain in left hand: Secondary | ICD-10-CM | POA: Diagnosis not present

## 2020-12-16 DIAGNOSIS — M353 Polymyalgia rheumatica: Secondary | ICD-10-CM | POA: Diagnosis not present

## 2020-12-16 DIAGNOSIS — M150 Primary generalized (osteo)arthritis: Secondary | ICD-10-CM | POA: Diagnosis not present

## 2020-12-16 DIAGNOSIS — M79641 Pain in right hand: Secondary | ICD-10-CM | POA: Diagnosis not present

## 2020-12-22 ENCOUNTER — Encounter: Payer: Self-pay | Admitting: Family Medicine

## 2020-12-23 NOTE — Telephone Encounter (Signed)
Tell him to stay OFF the Olmesartan. I would like to see the systolic reading stay between 100 and 844 and the diastolic between 60 and 90 most of the time.

## 2021-01-05 ENCOUNTER — Ambulatory Visit: Payer: Medicare Other

## 2021-01-07 ENCOUNTER — Other Ambulatory Visit: Payer: Self-pay

## 2021-01-07 ENCOUNTER — Ambulatory Visit (INDEPENDENT_AMBULATORY_CARE_PROVIDER_SITE_OTHER): Payer: Medicare Other | Admitting: General Practice

## 2021-01-07 DIAGNOSIS — I4891 Unspecified atrial fibrillation: Secondary | ICD-10-CM | POA: Diagnosis not present

## 2021-01-07 LAB — POCT INR: INR: 2.8 (ref 2.0–3.0)

## 2021-01-07 NOTE — Patient Instructions (Addendum)
Pre visit review using our clinic review tool, if applicable. No additional management support is needed unless otherwise documented below in the visit note.  Continue to take 1 (5 mg) tablet daily except 1/2 tablet (2.5 mg) on Wednesdays and Saturdays.   Re-check in 4 weeks.

## 2021-01-08 ENCOUNTER — Other Ambulatory Visit: Payer: Self-pay | Admitting: Family Medicine

## 2021-01-15 DIAGNOSIS — M79641 Pain in right hand: Secondary | ICD-10-CM | POA: Diagnosis not present

## 2021-01-15 DIAGNOSIS — M353 Polymyalgia rheumatica: Secondary | ICD-10-CM | POA: Diagnosis not present

## 2021-01-15 DIAGNOSIS — M79642 Pain in left hand: Secondary | ICD-10-CM | POA: Diagnosis not present

## 2021-01-15 DIAGNOSIS — M150 Primary generalized (osteo)arthritis: Secondary | ICD-10-CM | POA: Diagnosis not present

## 2021-01-20 ENCOUNTER — Telehealth: Payer: Self-pay | Admitting: Pharmacist

## 2021-01-20 NOTE — Chronic Care Management (AMB) (Signed)
Chronic Care Management Pharmacy Assistant   Name: Tanner Hensley  MRN: [id number] DOB: 04/08/1941  Reason for Encounter: Disease State/General Assessment Call.   Conditions to be addressed/monitored: HTN, HLD and and A-FIB (Atrial Fibrillation).   Recent office visits:  12/01/20 Alysia Penna MD ( PCP) - presented to clinic for COVID 19 and other chronic conditions. Discontinued ondansetron 4mg . No follow up noted.   Recent consult visits:  None.   Hospital visits:  None in previous 6 months  Medications: Outpatient Encounter Medications as of 01/20/2021  Medication Sig Note  . acetaminophen (TYLENOL) 500 MG tablet Take 1,000 mg by mouth every 4 (four) hours as needed for mild pain. 10/25/2020: For covid treatment.  Marland Kitchen alendronate (FOSAMAX) 70 MG tablet TAKE 1 TABLET BY MOUTH  WEEKLY WITH 8 OUNCE OF  PLAIN WATER 1/2 HOUR BEFORE FIRST FOOD DRINK OR MEDS.  STAY UPRIGHT FOR 1/2 HOUR (Patient taking differently: Take 70 mg by mouth every Friday.)   . ascorbic acid (VITAMIN C) 500 MG tablet Take 500 mg by mouth daily.   Marland Kitchen aspirin EC 81 MG tablet Take 81 mg by mouth daily.   Marland Kitchen atenolol (TENORMIN) 25 MG tablet Take 25 mg by mouth daily.   Marland Kitchen b complex vitamins tablet Take 1 tablet by mouth daily.   . benzonatate (TESSALON) 200 MG capsule Take 1 capsule (200 mg total) by mouth every 6 (six) hours as needed for cough.   . Biotin 1000 MCG tablet Take 5,000 mcg by mouth daily.    . calcium citrate-vitamin D (CITRACAL+D) 315-200 MG-UNIT tablet Take 1 tablet by mouth 2 (two) times daily.   Marland Kitchen dextromethorphan-guaiFENesin (MUCINEX DM) 30-600 MG 12hr tablet Take 1 tablet by mouth 2 (two) times daily.   Marland Kitchen donepezil (ARICEPT) 10 MG tablet TAKE 1 TABLET BY MOUTH AT  BEDTIME   . memantine (NAMENDA) 10 MG tablet TAKE 1 TABLET BY MOUTH  TWICE DAILY (Patient taking differently: Take 10 mg by mouth 2 (two) times daily.)   . Multiple Vitamin (MULTIVITAMIN WITH MINERALS) TABS tablet Take 1 tablet by mouth  daily. SENIOR MULTIVITAMIN   . olmesartan (BENICAR) 20 MG tablet TAKE 1 TABLET BY MOUTH  DAILY   . oxybutynin (DITROPAN-XL) 5 MG 24 hr tablet Take 1 tablet (5 mg total) by mouth at bedtime.   Marland Kitchen warfarin (COUMADIN) 5 MG tablet TAKE 1 TABLET BY MOUTH  DAILY EXCEPT 1.5 TABLETS ON WEDNESDAY AND SATURDAY OR  AS DIRECTED BY  ANTICOAGULATION CLINIC (Patient taking differently: Take 2.5-5 mg by mouth See admin instructions. Take 5mg  every day except Wednesday (2.5mg )) 10/25/2020: Regimen confirmed per wife.   No facility-administered encounter medications on file as of 01/20/2021.   Reviewed chart prior to disease state call. Spoke with patient regarding BP  Recent Office Vitals: BP Readings from Last 3 Encounters:  12/01/20 110/60  11/05/20 (!) 160/66  10/28/20 132/68   Pulse Readings from Last 3 Encounters:  12/01/20 60  11/05/20 60  10/28/20 (!) 58    Wt Readings from Last 3 Encounters:  12/01/20 159 lb 3.2 oz (72.2 kg)  11/05/20 167 lb 8.8 oz (76 kg)  11/03/20 167 lb (75.8 kg)     Kidney Function Lab Results  Component Value Date/Time   CREATININE 1.17 11/05/2020 12:06 PM   CREATININE 0.93 10/28/2020 04:31 AM   CREATININE 1.20 (H) 06/24/2020 11:04 AM   GFR 72.11 06/21/2019 11:57 AM   GFRNONAA >60 11/05/2020 12:06 PM   GFRAA >60 10/16/2016 05:22  AM    BMP Latest Ref Rng & Units 11/05/2020 10/28/2020 10/27/2020  Glucose 70 - 99 mg/dL 129(H) 107(H) 91  BUN 8 - 23 mg/dL 13 15 15   Creatinine 0.61 - 1.24 mg/dL 1.17 0.93 1.09  BUN/Creat Ratio 6 - 22 (calc) - - -  Sodium 135 - 145 mmol/L 141 138 138  Potassium 3.5 - 5.1 mmol/L 4.2 3.9 4.3  Chloride 98 - 111 mmol/L 106 108 106  CO2 22 - 32 mmol/L 27 23 24   Calcium 8.9 - 10.3 mg/dL 9.4 8.3(L) 8.5(L)    . Current antihypertensive regimen:   Atenolol 25 mg daily - in AM  Olmesartan 20 mg daily - in AM  . How often are you checking your Blood Pressure?   . Current home BP readings:   . What recent interventions/DTPs have been made  by any provider to improve Blood Pressure control since last CPP Visit: None.   . Any recent hospitalizations or ED visits since last visit with CPP? No  . What diet changes have been made to improve Blood Pressure Control?  o   . What exercise is being done to improve your Blood Pressure Control?  o   Adherence Review: Is the patient currently on ACE/ARB medication?  Does the patient have >5 day gap between last estimated fill dates?    Comprehensive medication review performed; Spoke to patient regarding cholesterol  Lipid Panel    Component Value Date/Time   CHOL 192 06/24/2020 1104   TRIG 129 06/24/2020 1104   HDL 49 06/24/2020 1104   LDLCALC 119 (H) 06/24/2020 1104   LDLDIRECT 176.1 07/20/2012 1050    10-year ASCVD risk score: The ASCVD Risk score Mikey Bussing DC Jr., et al., 2013) failed to calculate for the following reasons:   The 2013 ASCVD risk score is only valid for ages 93 to 33  . Current antihyperlipidemic regimen:  o None.   . Previous antihyperlipidemic medications tried: Simvastatin (Myalgias), Atorvastatin (myalgias)  . ASCVD risk enhancing conditions: age >42, DM and HTN  . What recent interventions/DTPs have been made by any provider to improve Cholesterol control since last CPP Visit: None.   . Any recent hospitalizations or ED visits since last visit with CPP? No  . What diet changes have been made to improve Cholesterol?  o  . What exercise is being done to improve Cholesterol?  o   Adherence Review: Does the patient have >5 day gap between last estimated fill dates?   Multiple unsuccessful attempts to reach patient by phone.   Star Rating Drugs:   olmesartan 20mg  - last filled on 11/14/20 90DS at [facility].  [place] [phone]

## 2021-01-23 NOTE — Telephone Encounter (Cosign Needed)
2 attempts.

## 2021-01-26 NOTE — Telephone Encounter (Cosign Needed)
3 attempts

## 2021-01-29 ENCOUNTER — Encounter: Payer: Self-pay | Admitting: Family Medicine

## 2021-01-29 DIAGNOSIS — M810 Age-related osteoporosis without current pathological fracture: Secondary | ICD-10-CM

## 2021-02-03 NOTE — Telephone Encounter (Signed)
I ordered the scan

## 2021-02-04 ENCOUNTER — Ambulatory Visit (INDEPENDENT_AMBULATORY_CARE_PROVIDER_SITE_OTHER): Payer: Medicare Other | Admitting: General Practice

## 2021-02-04 ENCOUNTER — Other Ambulatory Visit: Payer: Self-pay

## 2021-02-04 DIAGNOSIS — I4891 Unspecified atrial fibrillation: Secondary | ICD-10-CM | POA: Diagnosis not present

## 2021-02-04 LAB — POCT INR: INR: 3 (ref 2.0–3.0)

## 2021-02-04 NOTE — Patient Instructions (Addendum)
Pre visit review using our clinic review tool, if applicable. No additional management support is needed unless otherwise documented below in the visit note.  Continue to take 1 (5 mg) tablet daily except 1/2 tablet (2.5 mg) on Wednesdays and Saturdays.   Re-check in 6 weeks.

## 2021-02-05 ENCOUNTER — Telehealth: Payer: Self-pay | Admitting: Pharmacist

## 2021-02-05 NOTE — Chronic Care Management (AMB) (Signed)
Chronic Care Management Pharmacy Assistant   Name: Tanner Hensley  MRN: [id number] DOB: [birth year]   Reason for Encounter: Disease State/ General Assessment Call.    Conditions to be addressed/monitored: HTN, HLD and and A-FIB.   Recent office visits:  None.   Recent consult visits:  None.   Hospital visits:  None in previous 6 months  Medications: Outpatient Encounter Medications as of 02/05/2021  Medication Sig Note   acetaminophen (TYLENOL) 500 MG tablet Take 1,000 mg by mouth every 4 (four) hours as needed for mild pain. 10/25/2020: For covid treatment.   alendronate (FOSAMAX) 70 MG tablet TAKE 1 TABLET BY MOUTH  WEEKLY WITH 8 OUNCE OF  PLAIN WATER 1/2 HOUR BEFORE FIRST FOOD DRINK OR MEDS.  STAY UPRIGHT FOR 1/2 HOUR (Patient taking differently: Take 70 mg by mouth every Friday.)    ascorbic acid (VITAMIN C) 500 MG tablet Take 500 mg by mouth daily.    aspirin EC 81 MG tablet Take 81 mg by mouth daily.    atenolol (TENORMIN) 25 MG tablet Take 25 mg by mouth daily.    b complex vitamins tablet Take 1 tablet by mouth daily.    benzonatate (TESSALON) 200 MG capsule Take 1 capsule (200 mg total) by mouth every 6 (six) hours as needed for cough.    Biotin 1000 MCG tablet Take 5,000 mcg by mouth daily.     calcium citrate-vitamin D (CITRACAL+D) 315-200 MG-UNIT tablet Take 1 tablet by mouth 2 (two) times daily.    dextromethorphan-guaiFENesin (MUCINEX DM) 30-600 MG 12hr tablet Take 1 tablet by mouth 2 (two) times daily.    donepezil (ARICEPT) 10 MG tablet TAKE 1 TABLET BY MOUTH AT  BEDTIME    memantine (NAMENDA) 10 MG tablet TAKE 1 TABLET BY MOUTH  TWICE DAILY (Patient taking differently: Take 10 mg by mouth 2 (two) times daily.)    Multiple Vitamin (MULTIVITAMIN WITH MINERALS) TABS tablet Take 1 tablet by mouth daily. SENIOR MULTIVITAMIN    olmesartan (BENICAR) 20 MG tablet TAKE 1 TABLET BY MOUTH  DAILY    oxybutynin (DITROPAN-XL) 5 MG 24 hr tablet Take 1 tablet (5 mg total) by  mouth at bedtime.    warfarin (COUMADIN) 5 MG tablet TAKE 1 TABLET BY MOUTH  DAILY EXCEPT 1.5 TABLETS ON WEDNESDAY AND SATURDAY OR  AS DIRECTED BY  ANTICOAGULATION CLINIC (Patient taking differently: Take 2.5-5 mg by mouth See admin instructions. Take 5mg  every day except Wednesday (2.5mg )) 10/25/2020: Regimen confirmed per wife.   No facility-administered encounter medications on file as of 02/05/2021.    Reviewed chart prior to disease state call. Spoke with patient regarding BP  Recent Office Vitals: BP Readings from Last 3 Encounters:  12/01/20 110/60  11/05/20 (!) 160/66  10/28/20 132/68   Pulse Readings from Last 3 Encounters:  12/01/20 60  11/05/20 60  10/28/20 (!) 58    Wt Readings from Last 3 Encounters:  12/01/20 159 lb 3.2 oz (72.2 kg)  11/05/20 167 lb 8.8 oz (76 kg)  11/03/20 167 lb (75.8 kg)     Kidney Function Lab Results  Component Value Date/Time   CREATININE 1.17 11/05/2020 12:06 PM   CREATININE 0.93 10/28/2020 04:31 AM   CREATININE 1.20 (H) 06/24/2020 11:04 AM   GFR 72.11 06/21/2019 11:57 AM   GFRNONAA >60 11/05/2020 12:06 PM   GFRAA >60 10/16/2016 05:22 AM    BMP Latest Ref Rng & Units 11/05/2020 10/28/2020 10/27/2020  Glucose 70 - 99 mg/dL 129(H) 107(H)  91  BUN 8 - 23 mg/dL 13 15 15   Creatinine 0.61 - 1.24 mg/dL 1.17 0.93 1.09  BUN/Creat Ratio 6 - 22 (calc) - - -  Sodium 135 - 145 mmol/L 141 138 138  Potassium 3.5 - 5.1 mmol/L 4.2 3.9 4.3  Chloride 98 - 111 mmol/L 106 108 106  CO2 22 - 32 mmol/L 27 23 24   Calcium 8.9 - 10.3 mg/dL 9.4 8.3(L) 8.5(L)    Current antihypertensive regimen:  Atenolol 25 mg daily - in AM Olmesartan 20 mg daily - in AM  How often are you checking your Blood Pressure?  Current home BP readings:  What recent interventions/DTPs have been made by any provider to improve Blood Pressure control since last CPP Visit: None.  Any recent hospitalizations or ED visits since last visit with CPP? No What diet changes have been made to  improve Blood Pressure Control?   What exercise is being done to improve your Blood Pressure Control?    Adherence Review: Is the patient currently on ACE/ARB medication? Yes Does the patient have >5 day gap between last estimated fill dates? No  Comprehensive medication review performed; Spoke to patient regarding cholesterol  Lipid Panel    Component Value Date/Time   CHOL 192 06/24/2020 1104   TRIG 129 06/24/2020 1104   HDL 49 06/24/2020 1104   LDLCALC 119 (H) 06/24/2020 1104   LDLDIRECT 176.1 07/20/2012 1050    10-year ASCVD risk score: The ASCVD Risk score Mikey Bussing DC Jr., et al., 2013) failed to calculate for the following reasons:   The 2013 ASCVD risk score is only valid for ages 66 to 68  Current antihyperlipidemic regimen:  None.  Previous antihyperlipidemic medications tried: Simvastatin (Myalgias), Atorvastatin (myalgias)    ASCVD risk enhancing conditions: age >75, DM and HTN What recent interventions/DTPs have been made by any provider to improve Cholesterol control since last CPP Visit: None.  Any recent hospitalizations or ED visits since last visit with CPP? No What diet changes have been made to improve Cholesterol?   What exercise is being done to improve Cholesterol?    Adherence Review: Does the patient have >5 day gap between last estimated fill dates? No  Multiple unsuccessful attempts to reach patient by phone.   Star Rating Drugs:  olmesartan 20mg  - last filled on 11/14/20 90DS at [facility].  [facility]  Clinical Pharmacist Assistant [phone]

## 2021-02-10 NOTE — Telephone Encounter (Cosign Needed)
2nd attempt

## 2021-02-11 NOTE — Telephone Encounter (Cosign Needed)
3rd attempt

## 2021-02-13 ENCOUNTER — Ambulatory Visit (INDEPENDENT_AMBULATORY_CARE_PROVIDER_SITE_OTHER)
Admission: RE | Admit: 2021-02-13 | Discharge: 2021-02-13 | Disposition: A | Discharge status: Home / Self Care | Payer: Medicare Other | Source: Ambulatory Visit | Attending: Family Medicine | Admitting: Family Medicine

## 2021-02-13 ENCOUNTER — Other Ambulatory Visit: Payer: Self-pay

## 2021-02-13 DIAGNOSIS — M810 Age-related osteoporosis without current pathological fracture: Secondary | ICD-10-CM

## 2021-02-16 ENCOUNTER — Encounter: Payer: Self-pay | Admitting: Family Medicine

## 2021-02-17 NOTE — Telephone Encounter (Signed)
Spoke with pt advised that Dr Sarajane Jews has not viewed his Bone density results, advised pt that I will call him tomorrow which pt stated that it was ok

## 2021-02-19 NOTE — Telephone Encounter (Signed)
Reviewed Dexa scan with pt and his wife verbalized understanding of the results and recommendations

## 2021-02-20 NOTE — Progress Notes (Signed)
Spoke with pt wife verbalized understanding of Dr Sarajane Jews advise/ recommendation

## 2021-02-26 ENCOUNTER — Other Ambulatory Visit: Payer: Medicare Other

## 2021-03-14 ENCOUNTER — Encounter: Payer: Self-pay | Admitting: Nurse Practitioner

## 2021-03-14 ENCOUNTER — Telehealth: Payer: Medicare Other

## 2021-03-14 ENCOUNTER — Telehealth: Payer: Medicare Other | Admitting: Nurse Practitioner

## 2021-03-14 DIAGNOSIS — Z20822 Contact with and (suspected) exposure to covid-19: Secondary | ICD-10-CM | POA: Diagnosis not present

## 2021-03-14 DIAGNOSIS — U071 COVID-19: Secondary | ICD-10-CM

## 2021-03-14 MED ORDER — MOLNUPIRAVIR EUA 200MG CAPSULE: 4.0000 | ORAL_CAPSULE | Freq: Two times a day (BID) | ORAL | 0 refills | Status: AC

## 2021-03-14 NOTE — Progress Notes (Signed)
Virtual Visit Consent   Tanner Hensley, you are scheduled for a virtual visit with Mary-Margaret Hassell Done, [place], a [facility] provider, today.     Just as with appointments in the office, your consent must be obtained to participate.  Your consent will be active for this visit and any virtual visit you may have with one of our providers in the next 365 days.     If you have a MyChart account, a copy of this consent can be sent to you electronically.  All virtual visits are billed to your insurance company just like a traditional visit in the office.    As this is a virtual visit, video technology does not allow for your provider to perform a traditional examination.  This may limit your provider's ability to fully assess your condition.  If your provider identifies any concerns that need to be evaluated in person or the need to arrange testing (such as labs, EKG, etc.), we will make arrangements to do so.     Although advances in technology are sophisticated, we cannot ensure that it will always work on either your end or our end.  If the connection with a video visit is poor, the visit may have to be switched to a telephone visit.  With either a video or telephone visit, we are not always able to ensure that we have a secure connection.     I need to obtain your verbal consent now.   Are you willing to proceed with your visit today? YES   Tanner Hensley has provided verbal consent on 03/14/2021 for a virtual visit (video or telephone).   Mary-Margaret Hassell Done, FNP   Date: 03/14/2021 1:08 PM   Virtual Visit via Video Note   I, Mary-Margaret Hassell Done, connected with Tanner Hensley ([id number], October 22, 1940) on 03/14/21 at  1:15 PM EDT by a video-enabled telemedicine application and verified that I am speaking with the correct person using two identifiers.  Location: Patient: Virtual Visit Location Patient: Home Provider: Virtual Visit Location Provider: Mobile   I discussed the limitations of  evaluation and management by telemedicine and the availability of in person appointments. The patient expressed understanding and agreed to proceed.    History of Present Illness: Tanner Hensley is a 80 y.o. who identifies as a male who was assigned male at birth, and is being seen today for covid positive.  HPI: HPI   Patient wife tested positive yesterday. Went ahead and tested patient today even though he is asymptomatic. Wanted to get something to prevent him from getting sick. He was oin hospital with covid in February this year.  Review of Systems  Constitutional:  Negative for chills and fever.  HENT:  Negative for congestion.   Respiratory:  Negative for cough, sputum production and shortness of breath.   Musculoskeletal:  Negative for myalgias.  Neurological:  Negative for headaches.   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   KNEE SPRAIN 11/10/2009   INGUINAL  HERNIA 09/17/2009   CHEST WALL PAIN, ACUTE 09/17/2009   Dyslipidemia 09/13/2008   OVERACTIVE BLADDER 01/26/2008   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   Essential hypertension 05/22/2007   GERD 05/22/2007   ATRIAL FIBRILLATION, CHRONIC 05/01/2007    Allergies:  Allergies  Allergen Reactions   Codeine Rash and Other (See Comments)    Dizziness, low blood pressure and heart rate   Hydrocodone-Acetaminophen Rash and Other (See Comments)    Dizziness, low blood pressure and  heart rate   Tramadol Nausea And Vomiting   Lipitor [Atorvastatin] Other (See Comments)    Myalgias    Robaxin [Methocarbamol] Other (See Comments)    Dropped blood pressure too much   Zocor [Simvastatin] Other (See Comments)    myalgias   Medications:  Current Outpatient Medications:    acetaminophen (TYLENOL) 500 MG tablet, Take 1,000 mg by mouth every 4 (four) hours as needed for mild pain., Disp: , Rfl:    alendronate (FOSAMAX) 70 MG tablet, TAKE 1 TABLET BY MOUTH  WEEKLY WITH 8 OUNCE OF  PLAIN WATER 1/2 HOUR BEFORE FIRST FOOD DRINK OR MEDS.  STAY UPRIGHT FOR 1/2 HOUR (Patient taking differently: Take 70 mg by mouth every Friday.), Disp: 12 tablet, Rfl: 3   ascorbic acid (VITAMIN C) 500 MG tablet, Take 500 mg by mouth daily., Disp: , Rfl:    aspirin EC 81 MG tablet, Take 81 mg by mouth daily., Disp: , Rfl:    atenolol (TENORMIN) 25 MG tablet, Take 25 mg by mouth daily., Disp: , Rfl: 0   b complex vitamins tablet, Take 1 tablet by mouth daily., Disp: , Rfl:    benzonatate (TESSALON) 200 MG capsule, Take 1 capsule (200 mg total) by mouth every 6 (six) hours as needed for cough., Disp: 60 capsule, Rfl: 2   Biotin 1000 MCG tablet, Take 5,000 mcg by mouth daily. , Disp: , Rfl:    calcium citrate-vitamin D (CITRACAL+D) 315-200 MG-UNIT tablet, Take 1 tablet by mouth 2 (two) times daily., Disp: , Rfl:    dextromethorphan-guaiFENesin (MUCINEX DM) 30-600 MG 12hr tablet, Take 1 tablet by mouth 2 (two) times daily., Disp: 60 tablet, Rfl: 2   donepezil (ARICEPT) 10 MG tablet, TAKE 1 TABLET BY MOUTH AT  BEDTIME, Disp: 90 tablet, Rfl: 1   memantine (NAMENDA) 10 MG tablet, TAKE 1 TABLET BY MOUTH  TWICE DAILY (Patient taking differently: Take 10 mg by mouth 2 (two) times daily.), Disp: 180 tablet, Rfl: 3   Multiple Vitamin (MULTIVITAMIN WITH MINERALS) TABS tablet, Take 1 tablet by mouth daily. SENIOR MULTIVITAMIN, Disp: , Rfl:    olmesartan (BENICAR) 20 MG tablet, TAKE 1 TABLET BY MOUTH  DAILY, Disp: 90  tablet, Rfl: 1   oxybutynin (DITROPAN-XL) 5 MG 24 hr tablet, Take 1 tablet (5 mg total) by mouth at bedtime., Disp: 90 tablet, Rfl: 3   warfarin (COUMADIN) 5 MG tablet, TAKE 1 TABLET BY MOUTH  DAILY EXCEPT 1.5 TABLETS ON WEDNESDAY AND SATURDAY OR  AS DIRECTED BY  ANTICOAGULATION CLINIC (Patient taking differently: Take 2.5-5 mg by mouth See admin instructions. Take 5mg  every day except Wednesday (2.5mg )), Disp: 105 tablet, Rfl: 3  Observations/Objective: Patient is well-developed, well-nourished in no acute distress.  Resting comfortably  at home.  Head is normocephalic, atraumatic.  No labored breathing.  Speech is clear and coherent with logical content.  Patient is alert and oriented at baseline.    Assessment and Plan:  Tanner Hensley in today with  chief complaint of covid positive  1. Lab test positive for detection of COVID-19 virus Treat symptoms as they occurred Force fluids Rest RTO prn  Meds ordered this encounter  Medications   molnupiravir EUA 200 mg CAPS    Sig: Take 4 capsules (800 mg total) by mouth 2 (two) times daily for 5 days.    Dispense:  40 capsule    Refill:  0    Order Specific Question:   Supervising Provider    Answer:   Noemi Chapel [[id number]]     The above assessment and management plan was discussed with the patient. The patient verbalized understanding of and has agreed to the management plan. Patient is aware to call the clinic if symptoms persist or worsen. Patient is aware when to return to the clinic for a follow-up visit. Patient educated on when it is appropriate to go to the emergency department.   Mary-Margaret Hassell Done, FNP    Follow Up Instructions: I discussed the assessment and treatment plan with the patient. The patient was provided an opportunity to ask questions and all were answered. The patient agreed with the plan and demonstrated an understanding of the instructions.  A copy of instructions were sent to the patient via  MyChart.  The patient was advised to call back or seek an in-person evaluation if the symptoms worsen or if the condition fails to improve as anticipated.  Time:  I spent 12 minutes with the patient via telehealth technology discussing the above problems/concerns.    Mary-Margaret Hassell Done, FNP

## 2021-03-18 ENCOUNTER — Ambulatory Visit (INDEPENDENT_AMBULATORY_CARE_PROVIDER_SITE_OTHER): Payer: Medicare Other | Admitting: General Practice

## 2021-03-18 ENCOUNTER — Telehealth: Payer: Self-pay | Admitting: Pharmacist

## 2021-03-18 ENCOUNTER — Other Ambulatory Visit: Payer: Self-pay

## 2021-03-18 DIAGNOSIS — Z955 Presence of coronary angioplasty implant and graft: Secondary | ICD-10-CM | POA: Diagnosis not present

## 2021-03-18 DIAGNOSIS — Z950 Presence of cardiac pacemaker: Secondary | ICD-10-CM | POA: Diagnosis not present

## 2021-03-18 DIAGNOSIS — I4891 Unspecified atrial fibrillation: Secondary | ICD-10-CM | POA: Diagnosis not present

## 2021-03-18 DIAGNOSIS — R413 Other amnesia: Secondary | ICD-10-CM | POA: Diagnosis not present

## 2021-03-18 DIAGNOSIS — I2510 Atherosclerotic heart disease of native coronary artery without angina pectoris: Secondary | ICD-10-CM | POA: Diagnosis not present

## 2021-03-18 DIAGNOSIS — I4819 Other persistent atrial fibrillation: Secondary | ICD-10-CM | POA: Diagnosis not present

## 2021-03-18 DIAGNOSIS — I495 Sick sinus syndrome: Secondary | ICD-10-CM | POA: Diagnosis not present

## 2021-03-18 DIAGNOSIS — Z7901 Long term (current) use of anticoagulants: Secondary | ICD-10-CM | POA: Diagnosis not present

## 2021-03-18 DIAGNOSIS — I4439 Other atrioventricular block: Secondary | ICD-10-CM | POA: Diagnosis not present

## 2021-03-18 LAB — POCT INR: INR: 3.9 — AB (ref 2.0–3.0)

## 2021-03-18 NOTE — Chronic Care Management (AMB) (Signed)
Chronic Care Management Pharmacy Assistant   Name: Tanner Hensley  MRN: [id number] DOB: 1940/09/29   Reason for Encounter: Disease State/ General Assessment Call.   Conditions to be addressed/monitored: HTN, HLD, and A-FIB   Recent office visits:  03/14/21 Tanner Hensley [facility]) - video visit for COVID-19. Patient started on Molnupiravir 800mg  twice daily. Follow up as needed.   Recent consult visits:  None.   Hospital visits:  None in previous 6 months  Medications: Outpatient Encounter Medications as of 03/18/2021  Medication Sig Note   acetaminophen (TYLENOL) 500 MG tablet Take 1,000 mg by mouth every 4 (four) hours as needed for mild pain. 10/25/2020: For covid treatment.   alendronate (FOSAMAX) 70 MG tablet TAKE 1 TABLET BY MOUTH  WEEKLY WITH 8 OUNCE OF  PLAIN WATER 1/2 HOUR BEFORE FIRST FOOD DRINK OR MEDS.  STAY UPRIGHT FOR 1/2 HOUR (Patient taking differently: Take 70 mg by mouth every Friday.)    ascorbic acid (VITAMIN C) 500 MG tablet Take 500 mg by mouth daily.    aspirin EC 81 MG tablet Take 81 mg by mouth daily.    atenolol (TENORMIN) 25 MG tablet Take 25 mg by mouth daily.    b complex vitamins tablet Take 1 tablet by mouth daily.    benzonatate (TESSALON) 200 MG capsule Take 1 capsule (200 mg total) by mouth every 6 (six) hours as needed for cough.    Biotin 1000 MCG tablet Take 5,000 mcg by mouth daily.     calcium citrate-vitamin D (CITRACAL+D) 315-200 MG-UNIT tablet Take 1 tablet by mouth 2 (two) times daily.    dextromethorphan-guaiFENesin (MUCINEX DM) 30-600 MG 12hr tablet Take 1 tablet by mouth 2 (two) times daily.    donepezil (ARICEPT) 10 MG tablet TAKE 1 TABLET BY MOUTH AT  BEDTIME    memantine (NAMENDA) 10 MG tablet TAKE 1 TABLET BY MOUTH  TWICE DAILY (Patient taking differently: Take 10 mg by mouth 2 (two) times daily.)    molnupiravir EUA 200 mg CAPS Take 4 capsules (800 mg total) by mouth 2 (two) times daily for 5 days.    Multiple  Vitamin (MULTIVITAMIN WITH MINERALS) TABS tablet Take 1 tablet by mouth daily. SENIOR MULTIVITAMIN    olmesartan (BENICAR) 20 MG tablet TAKE 1 TABLET BY MOUTH  DAILY    oxybutynin (DITROPAN-XL) 5 MG 24 hr tablet Take 1 tablet (5 mg total) by mouth at bedtime.    warfarin (COUMADIN) 5 MG tablet TAKE 1 TABLET BY MOUTH  DAILY EXCEPT 1.5 TABLETS ON WEDNESDAY AND SATURDAY OR  AS DIRECTED BY  ANTICOAGULATION CLINIC (Patient taking differently: Take 2.5-5 mg by mouth See admin instructions. Take 5mg  every day except Wednesday (2.5mg )) 10/25/2020: Regimen confirmed per wife.   No facility-administered encounter medications on file as of 03/18/2021.   Fill History: ALENDRONATE SODIUM  70 MG TABS 11/14/2020 84   ATENOLOL  25 MG TABS 11/14/2020 90   BENZONATATE 200 MG CAPSULE 11/26/2020 15   DONEPEZIL HCL  10 MG TABS 11/14/2020 90   MEMANTINE HYDROCHLORIDE  10 MG TABS 11/27/2020 90   LAGEVRIO 200 MG CAP (EUA) 03/14/2021 5   OLMESARTAN MEDOXOMIL  20 MG TABS 11/14/2020 90   OXYBUTYNIN CHLORIDE ER  5 MG TB24 01/03/2021 90   WARFARIN SODIUM  5 MG TABS 01/11/2021 90    Comprehensive medication review performed; Spoke to patient regarding cholesterol  Lipid Panel    Component Value Date/Time   CHOL 192 06/24/2020 1104  TRIG 129 06/24/2020 1104   HDL 49 06/24/2020 1104   LDLCALC 119 (H) 06/24/2020 1104   LDLDIRECT 176.1 07/20/2012 1050    10-year ASCVD risk score: The ASCVD Risk score Tanner Hensley., et al., 2013) failed to calculate for the following reasons:   The 2013 ASCVD risk score is only valid for ages 64 to 31  Current antihyperlipidemic regimen:  None.  Previous antihyperlipidemic medications tried: None.  ASCVD risk enhancing conditions: age >31, DM, and HTN. What recent interventions/DTPs have been made by any provider to improve Cholesterol control since last CPP Visit: None.  Any recent hospitalizations or ED visits since last visit with CPP? No What diet changes have been  made to improve Cholesterol?   What exercise is being done to improve Cholesterol?    Adherence Review: Does the patient have >5 day gap between last estimated fill dates? Yes  Reviewed chart prior to disease state call. Spoke with patient regarding BP  Recent Office Vitals: BP Readings from Last 3 Encounters:  12/01/20 110/60  11/05/20 (!) 160/66  10/28/20 132/68   Pulse Readings from Last 3 Encounters:  12/01/20 60  11/05/20 60  10/28/20 (!) 58    Wt Readings from Last 3 Encounters:  12/01/20 159 lb 3.2 oz (72.2 kg)  11/05/20 167 lb 8.8 oz (76 kg)  11/03/20 167 lb (75.8 kg)     Kidney Function Lab Results  Component Value Date/Time   CREATININE 1.17 11/05/2020 12:06 PM   CREATININE 0.93 10/28/2020 04:31 AM   CREATININE 1.20 (H) 06/24/2020 11:04 AM   GFR 72.11 06/21/2019 11:57 AM   GFRNONAA >60 11/05/2020 12:06 PM   GFRAA >60 10/16/2016 05:22 AM    BMP Latest Ref Rng & Units 11/05/2020 10/28/2020 10/27/2020  Glucose 70 - 99 mg/dL 129(H) 107(H) 91  BUN 8 - 23 mg/dL 13 15 15   Creatinine 0.61 - 1.24 mg/dL 1.17 0.93 1.09  BUN/Creat Ratio 6 - 22 (calc) - - -  Sodium 135 - 145 mmol/L 141 138 138  Potassium 3.5 - 5.1 mmol/L 4.2 3.9 4.3  Chloride 98 - 111 mmol/L 106 108 106  CO2 22 - 32 mmol/L 27 23 24   Calcium 8.9 - 10.3 mg/dL 9.4 8.3(L) 8.5(L)    Current antihypertensive regimen:  Atenolol 25 mg daily - in AM Olmesartan 20 mg daily - in AM How often are you checking your Blood Pressure?  Current home BP readings:  What recent interventions/DTPs have been made by any provider to improve Blood Pressure control since last CPP Visit: None.  Any recent hospitalizations or ED visits since last visit with CPP? No What diet changes have been made to improve Blood Pressure Control?   What exercise is being done to improve your Blood Pressure Control?    Adherence Review: Is the patient currently on ACE/ARB medication? Yes Does the patient have >5 day gap between last  estimated fill dates? Yes  Multiple attempts to reach patient by phone.    Care Gaps:  Covid-19 vaccine - never done. Zoster vaccines - never done. Eye exam - over due since 10/30/20 Hemoglobin A1C  - overdue since 12/23/20.    Tanner Rating Drugs:  olmesartan 20mg  - last filled on 11/14/20 at [place] Pharmacist Assistant [phone]

## 2021-03-18 NOTE — Patient Instructions (Addendum)
Pre visit review using our clinic review tool, if applicable. No additional management support is needed unless otherwise documented below in the visit note.  Skip dosage tomorrow and then change dosage and take 1 (5 mg) tablet daily except 1/2 tablet (2.5 mg) on Mondays Wednesdays and Saturdays.   Re-check in 3 weeks.

## 2021-03-20 NOTE — Telephone Encounter (Cosign Needed)
2nd attempt

## 2021-03-24 NOTE — Telephone Encounter (Cosign Needed)
3rd attempt

## 2021-04-08 ENCOUNTER — Other Ambulatory Visit: Payer: Self-pay

## 2021-04-08 ENCOUNTER — Ambulatory Visit (INDEPENDENT_AMBULATORY_CARE_PROVIDER_SITE_OTHER): Payer: Medicare Other | Admitting: General Practice

## 2021-04-08 DIAGNOSIS — I4891 Unspecified atrial fibrillation: Secondary | ICD-10-CM | POA: Diagnosis not present

## 2021-04-08 LAB — POCT INR: INR: 1.9 — AB (ref 2.0–3.0)

## 2021-04-08 NOTE — Patient Instructions (Signed)
Pre visit review using our clinic review tool, if applicable. No additional management support is needed unless otherwise documented below in the visit note.  Take 1 tablet today (8/3) and then continue to take 1 tablet daily except take 1/2 tablet on Mon Wed and Saturdays.  Re-check in 4 weeks.

## 2021-04-13 ENCOUNTER — Telehealth: Payer: Self-pay | Admitting: Pharmacist

## 2021-04-13 NOTE — Chronic Care Management (AMB) (Signed)
Chronic Care Management Pharmacy Assistant   Name: Tanner Hensley  MRN: [id number] DOB: 1941-04-27   Reason for Encounter: Disease State/ General Assessment Call.   Conditions to be addressed/monitored: HTN and HLD  Primary concerns for visit include: Hypertension, Hyperlipidemia and A-FIB.  Recent office visits:  04/08/21 Tanner Sprague RN - seen for anticoagulation visit. No medication changes or follow up noted.   Recent consult visits:  None.   Hospital visits:  None in previous 6 months  Medications: Outpatient Encounter Medications as of 04/13/2021  Medication Sig Note   acetaminophen (TYLENOL) 500 MG tablet Take 1,000 mg by mouth every 4 (four) hours as needed for mild pain. 10/25/2020: For covid treatment.   alendronate (FOSAMAX) 70 MG tablet TAKE 1 TABLET BY MOUTH  WEEKLY WITH 8 OUNCE OF  PLAIN WATER 1/2 HOUR BEFORE FIRST FOOD DRINK OR MEDS.  STAY UPRIGHT FOR 1/2 HOUR (Patient taking differently: Take 70 mg by mouth every Friday.)    ascorbic acid (VITAMIN C) 500 MG tablet Take 500 mg by mouth daily.    aspirin EC 81 MG tablet Take 81 mg by mouth daily.    atenolol (TENORMIN) 25 MG tablet Take 25 mg by mouth daily.    b complex vitamins tablet Take 1 tablet by mouth daily.    benzonatate (TESSALON) 200 MG capsule Take 1 capsule (200 mg total) by mouth every 6 (six) hours as needed for cough.    Biotin 1000 MCG tablet Take 5,000 mcg by mouth daily.     calcium citrate-vitamin D (CITRACAL+D) 315-200 MG-UNIT tablet Take 1 tablet by mouth 2 (two) times daily.    dextromethorphan-guaiFENesin (MUCINEX DM) 30-600 MG 12hr tablet Take 1 tablet by mouth 2 (two) times daily.    donepezil (ARICEPT) 10 MG tablet TAKE 1 TABLET BY MOUTH AT  BEDTIME    memantine (NAMENDA) 10 MG tablet TAKE 1 TABLET BY MOUTH  TWICE DAILY (Patient taking differently: Take 10 mg by mouth 2 (two) times daily.)    Multiple Vitamin (MULTIVITAMIN WITH MINERALS) TABS tablet Take 1 tablet by mouth daily. SENIOR  MULTIVITAMIN    olmesartan (BENICAR) 20 MG tablet TAKE 1 TABLET BY MOUTH  DAILY    oxybutynin (DITROPAN-XL) 5 MG 24 hr tablet Take 1 tablet (5 mg total) by mouth at bedtime.    warfarin (COUMADIN) 5 MG tablet TAKE 1 TABLET BY MOUTH  DAILY EXCEPT 1.5 TABLETS ON WEDNESDAY AND SATURDAY OR  AS DIRECTED BY  ANTICOAGULATION CLINIC (Patient taking differently: Take 2.5-5 mg by mouth See admin instructions. Take '5mg'$  every day except Wednesday (2.'5mg'$ )) 10/25/2020: Regimen confirmed per wife.   No facility-administered encounter medications on file as of 04/13/2021.   Fill History:  ALENDRONATE SODIUM  70 MG TABS 11/14/2020 84   ATENOLOL  25 MG TABS 11/14/2020 90   BENZONATATE 200 MG CAPSULE 11/26/2020 15   DONEPEZIL HCL  10 MG TABS 11/14/2020 90   MEMANTINE HYDROCHLORIDE  10 MG TABS 11/27/2020 90   LAGEVRIO 200 MG CAP (EUA) 03/14/2021 5   OLMESARTAN MEDOXOMIL  20 MG TABS 11/14/2020 90   OXYBUTYNIN CHLORIDE ER  5 MG TB24 01/03/2021 90   WARFARIN SODIUM  5 MG TABS 01/11/2021 90   PREDNISONE 5 MG TABLET 03/10/2021 30    Reviewed chart prior to disease state call. Spoke with patient regarding BP  Recent Office Vitals: BP Readings from Last 3 Encounters:  12/01/20 110/60  11/05/20 (!) 160/66  10/28/20 132/68   Pulse Readings from  Last 3 Encounters:  12/01/20 60  11/05/20 60  10/28/20 (!) 58    Wt Readings from Last 3 Encounters:  12/01/20 159 lb 3.2 oz (72.2 kg)  11/05/20 167 lb 8.8 oz (76 kg)  11/03/20 167 lb (75.8 kg)     Kidney Function Lab Results  Component Value Date/Time   CREATININE 1.17 11/05/2020 12:06 PM   CREATININE 0.93 10/28/2020 04:31 AM   CREATININE 1.20 (H) 06/24/2020 11:04 AM   GFR 72.11 06/21/2019 11:57 AM   GFRNONAA >60 11/05/2020 12:06 PM   GFRAA >60 10/16/2016 05:22 AM    BMP Latest Ref Rng & Units 11/05/2020 10/28/2020 10/27/2020  Glucose 70 - 99 mg/dL 129(H) 107(H) 91  BUN 8 - 23 mg/dL '13 15 15  '$ Creatinine 0.61 - 1.24 mg/dL 1.17 0.93 1.09  BUN/Creat  Ratio 6 - 22 (calc) - - -  Sodium 135 - 145 mmol/L 141 138 138  Potassium 3.5 - 5.1 mmol/L 4.2 3.9 4.3  Chloride 98 - 111 mmol/L 106 108 106  CO2 22 - 32 mmol/L '27 23 24  '$ Calcium 8.9 - 10.3 mg/dL 9.4 8.3(L) 8.5(L)    Current antihypertensive regimen:  Atenolol 25 mg daily - in AM Olmesartan 20 mg daily - in AM How often are you checking your Blood Pressure?  Current home BP readings:  What recent interventions/DTPs have been made by any provider to improve Blood Pressure control since last CPP Visit: None.  Any recent hospitalizations or ED visits since last visit with CPP? No What diet changes have been made to improve Blood Pressure Control?   What exercise is being done to improve your Blood Pressure Control?    Adherence Review: Is the patient currently on ACE/ARB medication? Yes Does the patient have >5 day gap between last estimated fill dates? Yes   Comprehensive medication review performed; Spoke to patient regarding cholesterol  Lipid Panel    Component Value Date/Time   CHOL 192 06/24/2020 1104   TRIG 129 06/24/2020 1104   HDL 49 06/24/2020 1104   LDLCALC 119 (H) 06/24/2020 1104   LDLDIRECT 176.1 07/20/2012 1050    10-year ASCVD risk score: The ASCVD Risk score Mikey Bussing DC Jr., et al., 2013) failed to calculate for the following reasons:   The 2013 ASCVD risk score is only valid for ages 67 to 80  Current antihyperlipidemic regimen:  None. Previous antihyperlipidemic medications tried: None.  ASCVD risk enhancing conditions: age >54, DM, and HTN What recent interventions/DTPs have been made by any provider to improve Cholesterol control since last CPP Visit: None.  Any recent hospitalizations or ED visits since last visit with CPP? No What diet changes have been made to improve Cholesterol?   What exercise is being done to improve Cholesterol?    Adherence Review: Does the patient have >5 day gap between last estimated fill dates? Yes  Multiple unsuccessful  attempts to reach patient by phone or reschedule.  Care Gaps:  Covid-19 vaccine - never done. Zoster vaccines - never done. Eye exam - over due since 10/30/20 Hemoglobin A1C  - overdue since 12/23/20  Star Rating Drugs:  olmesartan '20mg'$  - last filled on 11/14/20 at [place] Pharmacist Assistant [phone]

## 2021-04-15 NOTE — Telephone Encounter (Cosign Needed)
2nd attempt

## 2021-04-17 NOTE — Telephone Encounter (Cosign Needed)
3rd attempt

## 2021-04-20 DIAGNOSIS — M150 Primary generalized (osteo)arthritis: Secondary | ICD-10-CM | POA: Diagnosis not present

## 2021-04-20 DIAGNOSIS — M0609 Rheumatoid arthritis without rheumatoid factor, multiple sites: Secondary | ICD-10-CM | POA: Diagnosis not present

## 2021-04-20 DIAGNOSIS — M79642 Pain in left hand: Secondary | ICD-10-CM | POA: Diagnosis not present

## 2021-04-20 DIAGNOSIS — M353 Polymyalgia rheumatica: Secondary | ICD-10-CM | POA: Diagnosis not present

## 2021-04-20 DIAGNOSIS — M79641 Pain in right hand: Secondary | ICD-10-CM | POA: Diagnosis not present

## 2021-05-06 ENCOUNTER — Other Ambulatory Visit: Payer: Self-pay

## 2021-05-06 ENCOUNTER — Ambulatory Visit (INDEPENDENT_AMBULATORY_CARE_PROVIDER_SITE_OTHER): Payer: Medicare Other

## 2021-05-06 DIAGNOSIS — Z7901 Long term (current) use of anticoagulants: Secondary | ICD-10-CM | POA: Diagnosis not present

## 2021-05-06 LAB — POCT INR: INR: 2.7 (ref 2.0–3.0)

## 2021-05-06 NOTE — Patient Instructions (Addendum)
Pre visit review using our clinic review tool, if applicable. No additional management support is needed unless otherwise documented below in the visit note.  Continue to take 1 tablet daily except take 1/2 tablet on Mon Wed and Saturdays.  Re-check in 5 weeks.

## 2021-05-14 ENCOUNTER — Telehealth: Payer: Self-pay | Admitting: Pharmacist

## 2021-05-14 NOTE — Chronic Care Management (AMB) (Addendum)
Chronic Care Management Pharmacy Assistant   Name: Tanner Hensley  MRN: [id number] DOB: 09-Apr-1941  Reason for Encounter: Disease State/ General Assessment Call   Conditions to be addressed/monitored: HTN and HLD  Primary concerns for visit include: Hypertension, Hyperlipidemia and A-FIB.  Recent office visits:  05/06/2021 [name] RN - seen for anticoagulation visit. No medication changes or follow up noted  Recent consult visits:  None  Hospital visits:  None in previous 6 months  Medications: Outpatient Encounter Medications as of 05/14/2021  Medication Sig Note   acetaminophen (TYLENOL) 500 MG tablet Take 1,000 mg by mouth every 4 (four) hours as needed for mild pain. 10/25/2020: For covid treatment.   alendronate (FOSAMAX) 70 MG tablet TAKE 1 TABLET BY MOUTH  WEEKLY WITH 8 OUNCE OF  PLAIN WATER 1/2 HOUR BEFORE FIRST FOOD DRINK OR MEDS.  STAY UPRIGHT FOR 1/2 HOUR (Patient taking differently: Take 70 mg by mouth every Friday.)    ascorbic acid (VITAMIN C) 500 MG tablet Take 500 mg by mouth daily.    aspirin EC 81 MG tablet Take 81 mg by mouth daily.    atenolol (TENORMIN) 25 MG tablet Take 25 mg by mouth daily.    b complex vitamins tablet Take 1 tablet by mouth daily.    benzonatate (TESSALON) 200 MG capsule Take 1 capsule (200 mg total) by mouth every 6 (six) hours as needed for cough.    Biotin 1000 MCG tablet Take 5,000 mcg by mouth daily.     calcium citrate-vitamin D (CITRACAL+D) 315-200 MG-UNIT tablet Take 1 tablet by mouth 2 (two) times daily.    dextromethorphan-guaiFENesin (MUCINEX DM) 30-600 MG 12hr tablet Take 1 tablet by mouth 2 (two) times daily.    donepezil (ARICEPT) 10 MG tablet TAKE 1 TABLET BY MOUTH AT  BEDTIME    memantine (NAMENDA) 10 MG tablet TAKE 1 TABLET BY MOUTH  TWICE DAILY (Patient taking differently: Take 10 mg by mouth 2 (two) times daily.)    Multiple Vitamin (MULTIVITAMIN WITH MINERALS) TABS tablet Take 1 tablet by mouth daily. SENIOR  MULTIVITAMIN    olmesartan (BENICAR) 20 MG tablet TAKE 1 TABLET BY MOUTH  DAILY    oxybutynin (DITROPAN-XL) 5 MG 24 hr tablet Take 1 tablet (5 mg total) by mouth at bedtime.    warfarin (COUMADIN) 5 MG tablet TAKE 1 TABLET BY MOUTH  DAILY EXCEPT 1.5 TABLETS ON WEDNESDAY AND SATURDAY OR  AS DIRECTED BY  ANTICOAGULATION CLINIC (Patient taking differently: Take 2.5-5 mg by mouth See admin instructions. Take '5mg'$  every day except Wednesday (2.'5mg'$ )) 10/25/2020: Regimen confirmed per wife.   No facility-administered encounter medications on file as of 05/14/2021.    Fill History:    ALENDRONATE SODIUM  70 MG TABS 11/14/2020 84   ATENOLOL  25 MG TABS 11/14/2020 90   BENZONATATE 200 MG CAPSULE 11/26/2020 15   DONEPEZIL HCL  10 MG TABS 11/14/2020 90   MEMANTINE HYDROCHLORIDE  10 MG TABS 11/27/2020 90   OLMESARTAN MEDOXOMIL  20 MG TABS 11/14/2020 90   OXYBUTYNIN CHLORIDE ER  5 MG TB24 01/03/2021 90   WARFARIN SODIUM  5 MG TABS 01/11/2021 90   Reviewed chart prior to disease state call. Spoke with patient regarding BP  Recent Office Vitals: BP Readings from Last 3 Encounters:  12/01/20 110/60  11/05/20 (!) 160/66  10/28/20 132/68   Pulse Readings from Last 3 Encounters:  12/01/20 60  11/05/20 60  10/28/20 (!) 58    Wt Readings  from Last 3 Encounters:  12/01/20 159 lb 3.2 oz (72.2 kg)  11/05/20 167 lb 8.8 oz (76 kg)  11/03/20 167 lb (75.8 kg)     Kidney Function Lab Results  Component Value Date/Time   CREATININE 1.17 11/05/2020 12:06 PM   CREATININE 0.93 10/28/2020 04:31 AM   CREATININE 1.20 (H) 06/24/2020 11:04 AM   GFR 72.11 06/21/2019 11:57 AM   GFRNONAA >60 11/05/2020 12:06 PM   GFRAA >60 10/16/2016 05:22 AM    BMP Latest Ref Rng & Units 11/05/2020 10/28/2020 10/27/2020  Glucose 70 - 99 mg/dL 129(H) 107(H) 91  BUN 8 - 23 mg/dL '13 15 15  '$ Creatinine 0.61 - 1.24 mg/dL 1.17 0.93 1.09  BUN/Creat Ratio 6 - 22 (calc) - - -  Sodium 135 - 145 mmol/L 141 138 138  Potassium 3.5 -  5.1 mmol/L 4.2 3.9 4.3  Chloride 98 - 111 mmol/L 106 108 106  CO2 22 - 32 mmol/L '27 23 24  '$ Calcium 8.9 - 10.3 mg/dL 9.4 8.3(L) 8.5(L)    Current antihypertensive regimen:  Atenolol 25 mg daily - in AM Olmesartan 20 mg daily - in AM How often are you checking your Blood Pressure?  Current home BP readings:  What recent interventions/DTPs have been made by any provider to improve Blood Pressure control since last CPP Visit: None. Any recent hospitalizations or ED visits since last visit with CPP? No What diet changes have been made to improve Blood Pressure Control?   What exercise is being done to improve your Blood Pressure Control?    Adherence Review: Is the patient currently on ACE/ARB medication? Yes Does the patient have >5 day gap between last estimated fill dates?   Comprehensive medication review performed; Spoke to patient regarding cholesterol  Lipid Panel    Component Value Date/Time   CHOL 192 06/24/2020 1104   TRIG 129 06/24/2020 1104   HDL 49 06/24/2020 1104   LDLCALC 119 (H) 06/24/2020 1104   LDLDIRECT 176.1 07/20/2012 1050    10-year ASCVD risk score: The ASCVD Risk score (Arnett DK, et al., 2019) failed to calculate for the following reasons:   The 2019 ASCVD risk score is only valid for ages 76 to 65  Current antihyperlipidemic regimen:  None. Previous antihyperlipidemic medications tried: None. ASCVD risk enhancing conditions: age >1, DM, and HTN What recent interventions/DTPs have been made by any provider to improve Cholesterol control since last CPP Visit: None. Any recent hospitalizations or ED visits since last visit with CPP? No What diet changes have been made to improve Cholesterol?   What exercise is being done to improve Cholesterol?    Adherence Review: Does the patient have >5 day gap between last estimated fill dates?   Multiple unsuccessful attempts to reach patient by phone.  Care Gaps:  Covid-19 vaccine - never done. Zoster  vaccines - never done. Eye exam - over due since 10/30/20 Hemoglobin A1C  - overdue since 12/23/20    Star Rating Drugs:  olmesartan '20mg'$  - last filled on 11/14/20 at [place] Pharmacist Assistant [phone]

## 2021-05-19 NOTE — Telephone Encounter (Cosign Needed)
2nd attempt

## 2021-05-22 NOTE — Telephone Encounter (Cosign Needed)
3rd attempt

## 2021-05-27 ENCOUNTER — Other Ambulatory Visit: Payer: Self-pay | Admitting: Family Medicine

## 2021-05-27 DIAGNOSIS — Z7901 Long term (current) use of anticoagulants: Secondary | ICD-10-CM

## 2021-06-02 ENCOUNTER — Other Ambulatory Visit: Payer: Self-pay

## 2021-06-03 ENCOUNTER — Ambulatory Visit (INDEPENDENT_AMBULATORY_CARE_PROVIDER_SITE_OTHER): Payer: Medicare Other

## 2021-06-03 DIAGNOSIS — Z7901 Long term (current) use of anticoagulants: Secondary | ICD-10-CM | POA: Diagnosis not present

## 2021-06-03 DIAGNOSIS — I4811 Longstanding persistent atrial fibrillation: Secondary | ICD-10-CM

## 2021-06-03 LAB — POCT INR: INR: 2.9 (ref 2.0–3.0)

## 2021-06-03 NOTE — Progress Notes (Signed)
I have reviewed and agree with this plan  

## 2021-06-03 NOTE — Patient Instructions (Addendum)
Pre visit review using our clinic review tool, if applicable. No additional management support is needed unless otherwise documented below in the visit note.  Continue to take 1 tablet daily except take 1/2 tablet on Mon, Wed, and Saturdays.  Re-check in 6 weeks. 

## 2021-07-07 ENCOUNTER — Other Ambulatory Visit: Payer: Self-pay | Admitting: Family Medicine

## 2021-07-11 ENCOUNTER — Other Ambulatory Visit: Payer: Self-pay | Admitting: Family Medicine

## 2021-07-15 ENCOUNTER — Ambulatory Visit (INDEPENDENT_AMBULATORY_CARE_PROVIDER_SITE_OTHER): Payer: Medicare Other

## 2021-07-15 ENCOUNTER — Ambulatory Visit: Payer: Medicare Other

## 2021-07-15 ENCOUNTER — Other Ambulatory Visit: Payer: Self-pay

## 2021-07-15 DIAGNOSIS — Z23 Encounter for immunization: Secondary | ICD-10-CM

## 2021-07-15 DIAGNOSIS — Z7901 Long term (current) use of anticoagulants: Secondary | ICD-10-CM

## 2021-07-15 LAB — POCT INR: INR: 3.1 — AB (ref 2.0–3.0)

## 2021-07-15 NOTE — Progress Notes (Signed)
Hold dose today and then continue to take 1 tablet daily except take 1/2 tablet on Mon, Wed, and Saturdays.  Re-check in 4 weeks.

## 2021-07-15 NOTE — Patient Instructions (Addendum)
Pre visit review using our clinic review tool, if applicable. No additional management support is needed unless otherwise documented below in the visit note.  Hold dose today and then continue to take 1 tablet daily except take 1/2 tablet on Mon, Wed, and Saturdays.  Re-check in 4 weeks.

## 2021-07-27 ENCOUNTER — Encounter: Payer: Medicare Other | Admitting: Family Medicine

## 2021-07-29 ENCOUNTER — Encounter: Payer: Medicare Other | Admitting: Family Medicine

## 2021-08-11 ENCOUNTER — Encounter: Payer: Self-pay | Admitting: Family Medicine

## 2021-08-11 ENCOUNTER — Ambulatory Visit (INDEPENDENT_AMBULATORY_CARE_PROVIDER_SITE_OTHER): Payer: Medicare Other | Admitting: Family Medicine

## 2021-08-11 VITALS — BP 140/80 | HR 59 | Temp 98.4°F | Ht 65.0 in | Wt 165.2 lb

## 2021-08-11 DIAGNOSIS — N138 Other obstructive and reflux uropathy: Secondary | ICD-10-CM

## 2021-08-11 DIAGNOSIS — I4811 Longstanding persistent atrial fibrillation: Secondary | ICD-10-CM

## 2021-08-11 DIAGNOSIS — R413 Other amnesia: Secondary | ICD-10-CM

## 2021-08-11 DIAGNOSIS — N401 Enlarged prostate with lower urinary tract symptoms: Secondary | ICD-10-CM | POA: Diagnosis not present

## 2021-08-11 DIAGNOSIS — E785 Hyperlipidemia, unspecified: Secondary | ICD-10-CM | POA: Diagnosis not present

## 2021-08-11 DIAGNOSIS — N318 Other neuromuscular dysfunction of bladder: Secondary | ICD-10-CM

## 2021-08-11 DIAGNOSIS — E1169 Type 2 diabetes mellitus with other specified complication: Secondary | ICD-10-CM | POA: Diagnosis not present

## 2021-08-11 DIAGNOSIS — I10 Essential (primary) hypertension: Secondary | ICD-10-CM | POA: Diagnosis not present

## 2021-08-11 DIAGNOSIS — M1990 Unspecified osteoarthritis, unspecified site: Secondary | ICD-10-CM | POA: Diagnosis not present

## 2021-08-11 DIAGNOSIS — Z7901 Long term (current) use of anticoagulants: Secondary | ICD-10-CM

## 2021-08-11 DIAGNOSIS — K219 Gastro-esophageal reflux disease without esophagitis: Secondary | ICD-10-CM | POA: Diagnosis not present

## 2021-08-11 DIAGNOSIS — R739 Hyperglycemia, unspecified: Secondary | ICD-10-CM

## 2021-08-11 DIAGNOSIS — D539 Nutritional anemia, unspecified: Secondary | ICD-10-CM

## 2021-08-11 DIAGNOSIS — I2510 Atherosclerotic heart disease of native coronary artery without angina pectoris: Secondary | ICD-10-CM

## 2021-08-11 LAB — PSA: PSA: 1.82 ng/mL (ref 0.10–4.00)

## 2021-08-11 LAB — HEPATIC FUNCTION PANEL
ALT: 21 U/L (ref 0–53)
AST: 28 U/L (ref 0–37)
Albumin: 4.4 g/dL (ref 3.5–5.2)
Alkaline Phosphatase: 63 U/L (ref 39–117)
Bilirubin, Direct: 0.1 mg/dL (ref 0.0–0.3)
Total Bilirubin: 0.9 mg/dL (ref 0.2–1.2)
Total Protein: 6.6 g/dL (ref 6.0–8.3)

## 2021-08-11 LAB — LIPID PANEL
Cholesterol: 198 mg/dL (ref 0–200)
HDL: 55.2 mg/dL (ref >39.00)
LDL Cholesterol: 123 mg/dL — ABNORMAL HIGH (ref 0–99)
NonHDL: 143.21
Total CHOL/HDL Ratio: 4
Triglycerides: 102 mg/dL (ref 0.0–149.0)
VLDL: 20.4 mg/dL (ref 0.0–40.0)

## 2021-08-11 LAB — CBC WITH DIFFERENTIAL/PLATELET
Basophils Absolute: 0 10*3/uL (ref 0.0–0.1)
Basophils Relative: 0.6 % (ref 0.0–3.0)
Eosinophils Absolute: 0 10*3/uL (ref 0.0–0.7)
Eosinophils Relative: 0.6 % (ref 0.0–5.0)
HCT: 43.1 % (ref 39.0–52.0)
Hemoglobin: 14.2 g/dL (ref 13.0–17.0)
Lymphocytes Relative: 10.9 % — ABNORMAL LOW (ref 12.0–46.0)
Lymphs Abs: 0.7 10*3/uL (ref 0.7–4.0)
MCHC: 32.9 g/dL (ref 30.0–36.0)
MCV: 107 fl — ABNORMAL HIGH (ref 78.0–100.0)
Monocytes Absolute: 0.6 10*3/uL (ref 0.1–1.0)
Monocytes Relative: 8.5 % (ref 3.0–12.0)
Neutro Abs: 5.4 10*3/uL (ref 1.4–7.7)
Neutrophils Relative %: 79.4 % — ABNORMAL HIGH (ref 43.0–77.0)
Platelets: 146 10*3/uL — ABNORMAL LOW (ref 150.0–400.0)
RBC: 4.02 Mil/uL — ABNORMAL LOW (ref 4.22–5.81)
RDW: 12.8 % (ref 11.5–15.5)
WBC: 6.8 10*3/uL (ref 4.0–10.5)

## 2021-08-11 LAB — BASIC METABOLIC PANEL
BUN: 18 mg/dL (ref 6–23)
CO2: 30 mEq/L (ref 19–32)
Calcium: 10.3 mg/dL (ref 8.4–10.5)
Chloride: 104 mEq/L (ref 96–112)
Creatinine, Ser: 1.06 mg/dL (ref 0.40–1.50)
GFR: 66.07 mL/min (ref >60.00)
Glucose, Bld: 104 mg/dL — ABNORMAL HIGH (ref 70–99)
Potassium: 4.6 mEq/L (ref 3.5–5.1)
Sodium: 141 mEq/L (ref 135–145)

## 2021-08-11 LAB — TSH: TSH: 1.54 u[IU]/mL (ref 0.35–5.50)

## 2021-08-11 LAB — HEMOGLOBIN A1C: Hgb A1c MFr Bld: 6.1 % (ref 4.6–6.5)

## 2021-08-11 MED ORDER — OXYBUTYNIN CHLORIDE ER 10 MG PO TB24: 10.0000 mg | ORAL_TABLET | Freq: Every day | ORAL | 3 refills | Status: DC

## 2021-08-11 MED ORDER — WARFARIN SODIUM 5 MG PO TABS: ORAL_TABLET | ORAL | 3 refills | Status: DC

## 2021-08-11 NOTE — Progress Notes (Signed)
Subjective:    Patient ID: Tanner Hensley, male    DOB: 1940-12-12, 80 y.o.   MRN: [id number]  HPI Here with his wife to follow up on issues. He is doing well. His BP is stable. The Oxybutynin does not help his OAB as much as it used to however. He has some urgency and some occasional incontinence. CAD and GERD are stable. He remains on Warfarin for atrial fibrillation. His memory is stable, and both of them think the Namenda and Aricept have made a big difference.    Review of Systems  Constitutional: Negative.   HENT: Negative.    Eyes: Negative.   Respiratory: Negative.    Cardiovascular: Negative.   Gastrointestinal: Negative.   Genitourinary:  Positive for frequency and urgency. Negative for dysuria.  Musculoskeletal: Negative.   Skin: Negative.   Neurological: Negative.   Psychiatric/Behavioral: Negative.        Objective:   Physical Exam Constitutional:      General: He is not in acute distress.    Appearance: Normal appearance. He is well-developed. He is not diaphoretic.  HENT:     Head: Normocephalic and atraumatic.     Right Ear: External ear normal.     Left Ear: External ear normal.     Nose: Nose normal.     Mouth/Throat:     Pharynx: No oropharyngeal exudate.  Eyes:     General: No scleral icterus.       Right eye: No discharge.        Left eye: No discharge.     Conjunctiva/sclera: Conjunctivae normal.     Pupils: Pupils are equal, round, and reactive to light.  Neck:     Thyroid: No thyromegaly.     Vascular: No JVD.     Trachea: No tracheal deviation.  Cardiovascular:     Rate and Rhythm: Normal rate and regular rhythm.     Heart sounds: Normal heart sounds. No murmur heard.   No friction rub. No gallop.  Pulmonary:     Effort: Pulmonary effort is normal. No respiratory distress.     Breath sounds: Normal breath sounds. No wheezing or rales.  Chest:     Chest wall: No tenderness.  Abdominal:     General: Bowel sounds are normal. There is no  distension.     Palpations: Abdomen is soft. There is no mass.     Tenderness: There is no abdominal tenderness. There is no guarding or rebound.  Genitourinary:    Penis: No tenderness.   Musculoskeletal:        General: No tenderness. Normal range of motion.     Cervical back: Neck supple.  Lymphadenopathy:     Cervical: No cervical adenopathy.  Skin:    General: Skin is warm and dry.     Coloration: Skin is not pale.     Findings: No erythema or rash.  Neurological:     Mental Status: He is alert and oriented to person, place, and time.     Cranial Nerves: No cranial nerve deficit.     Motor: No abnormal muscle tone.     Coordination: Coordination normal.     Deep Tendon Reflexes: Reflexes are normal and symmetric. Reflexes normal.  Psychiatric:        Behavior: Behavior normal.        Thought Content: Thought content normal.        Judgment: Judgment normal.          Assessment &  Plan:  His atrial fibrillation and CAD are stable. He is in NSR today. 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 He will get fasting labs to check lipids, etc. For the OAB, we will increase the Oxybutynin ER to 10 mg daily. We spent a total of ( 34  ) minutes reviewing records and discussing these issues.  Alysia Penna, MD

## 2021-08-12 ENCOUNTER — Ambulatory Visit (INDEPENDENT_AMBULATORY_CARE_PROVIDER_SITE_OTHER): Payer: Medicare Other

## 2021-08-12 DIAGNOSIS — Z7901 Long term (current) use of anticoagulants: Secondary | ICD-10-CM | POA: Diagnosis not present

## 2021-08-12 LAB — POCT INR: INR: 2.7 (ref 2.0–3.0)

## 2021-08-12 NOTE — Patient Instructions (Addendum)
Pre visit review using our clinic review tool, if applicable. No additional management support is needed unless otherwise documented below in the visit note.  Continue to take 1 tablet daily except take 1/2 tablet on Mon, Wed, and Saturdays.  Re-check in 6 weeks. 

## 2021-08-12 NOTE — Progress Notes (Signed)
Continue to take 1 tablet daily except take 1/2 tablet on Mon, Wed, and Saturdays.  Re-check in 6 weeks. 

## 2021-09-09 ENCOUNTER — Telehealth: Payer: Self-pay | Admitting: Pharmacist

## 2021-09-09 NOTE — Chronic Care Management (AMB) (Signed)
Chronic Care Management Pharmacy Assistant   Name: NABIL BUBOLZ  MRN: [id number] DOB: 02-27-1941  Reason for Encounter: Disease State / Diabetes Assessment Call   Conditions to be addressed/monitored: DMII  Recent office visits:  08/11/2021 Alysia Penna MD - Patient was seen for overactive bladder and additional issues. Increased Oxybutynin to 10 mg daily. Discontinued Fosamax, Mucinex and Benicar. No follow up noted  Recent consult visits:  None  Hospital visits:  None  Medications: Outpatient Encounter Medications as of 09/09/2021  Medication Sig Note   acetaminophen (TYLENOL) 500 MG tablet Take 1,000 mg by mouth every 4 (four) hours as needed for mild pain. 10/25/2020: For covid treatment.   ascorbic acid (VITAMIN C) 500 MG tablet Take 500 mg by mouth daily.    aspirin EC 81 MG tablet Take 81 mg by mouth daily.    atenolol (TENORMIN) 25 MG tablet Take 25 mg by mouth daily.    b complex vitamins tablet Take 1 tablet by mouth daily.    benzonatate (TESSALON) 200 MG capsule Take 1 capsule (200 mg total) by mouth every 6 (six) hours as needed for cough.    Biotin 1000 MCG tablet Take 5,000 mcg by mouth daily.     calcium citrate-vitamin D (CITRACAL+D) 315-200 MG-UNIT tablet Take 1 tablet by mouth 2 (two) times daily.    Cholecalciferol (VITAMIN D3 PO) Take by mouth daily at 2 PM.    donepezil (ARICEPT) 10 MG tablet TAKE 1 TABLET BY MOUTH AT  BEDTIME    memantine (NAMENDA) 10 MG tablet TAKE 1 TABLET BY MOUTH  TWICE DAILY    Multiple Vitamin (MULTIVITAMIN WITH MINERALS) TABS tablet Take 1 tablet by mouth daily. SENIOR MULTIVITAMIN    oxybutynin (DITROPAN-XL) 10 MG 24 hr tablet Take 1 tablet (10 mg total) by mouth at bedtime.    warfarin (COUMADIN) 5 MG tablet TAKE 1 TABLET BY MOUTH  DAILY EXCEPT 1.5 TABLETS ON WEDNESDAY AND SATURDAY OR  AS DIRECTED BY  ANTICOAGULATION CLINIC    No facility-administered encounter medications on file as of 09/09/2021.  Fill History: ATENOLOL  25  MG TABS 07/24/2021 90   DONEPEZIL HCL  10 MG TABS 04/30/2021 90   MEMANTINE HYDROCHLORIDE  10 MG TABS 08/06/2021 90   OXYBUTYNIN CHLORIDE ER  10 MG TB24 08/11/2021 90   WARFARIN SODIUM  5 MG TABS 08/11/2021 90   Recent Relevant Labs: Lab Results  Component Value Date/Time   HGBA1C 6.1 08/11/2021 02:06 PM   HGBA1C 5.6 06/24/2020 11:04 AM   MICROALBUR 1.3 08/09/2014 10:27 AM   MICROALBUR 2.4 (H) 07/13/2010 10:58 AM    Kidney Function Lab Results  Component Value Date/Time   CREATININE 1.06 08/11/2021 02:06 PM   CREATININE 1.17 11/05/2020 12:06 PM   CREATININE 1.20 (H) 06/24/2020 11:04 AM   GFR 66.07 08/11/2021 02:06 PM   GFRNONAA >60 11/05/2020 12:06 PM   GFRAA >60 10/16/2016 05:22 AM   Current antihyperglycemic regimen:  No current medications.  What recent interventions/DTPs have been made to improve glycemic control:  No current interventions however, he does try to eat a healthy diet including meats, vegetables and limited fruits.  Patients wife mentioned he had an episode of passing out yesterday, EMS came and evaluated him, they checked his blood pressure which was a little low and his blood sugar which was 189. ( Patient had just eaten some eggs with onions and peppers) Patients wife states she does have a call in for Dr. Sarajane Jews about this  and an appointment scheduled with Dr. Sarajane Jews next week.   Have there been any recent hospitalizations or ED visits since last visit with CPP? No recent hospital visits.  Patient reports hypoglycemic symptom of passing out yesterday, EMS checked blood sugar and it was 189 non fasting.  Patient denies hyperglycemic symptoms.  How often are you checking your blood sugar? Patients wife states they are not checking blood sugars at this time.  Are you checking your feet daily/regularly? Patients wife is not checking his feet regularly.  Adherence Review: Is the patient currently on a STATIN medication? No Is the patient currently on ACE/ARB  medication? No Does the patient have >5 day gap between last estimated fill dates? No  Appointment scheduled for April  Care Gaps: AWV - scheduled for 09/30/2021 Last BP - 140/80 on 08/08/2021 Last A1C - 6.1 on 08/11/2021 Covid vaccine - never done Shingrix - never done Malb - overdue  Star Rating Drugs: None  [place] Pharmacist Assistant [phone]

## 2021-09-15 ENCOUNTER — Ambulatory Visit (INDEPENDENT_AMBULATORY_CARE_PROVIDER_SITE_OTHER): Payer: Medicare Other | Admitting: Family Medicine

## 2021-09-15 ENCOUNTER — Encounter: Payer: Self-pay | Admitting: Family Medicine

## 2021-09-15 VITALS — BP 136/80 | HR 60 | Temp 98.4°F | Wt 165.1 lb

## 2021-09-15 DIAGNOSIS — R55 Syncope and collapse: Secondary | ICD-10-CM

## 2021-09-15 MED ORDER — SOLIFENACIN SUCCINATE 10 MG PO TABS: 10.0000 mg | ORAL_TABLET | Freq: Every day | ORAL | 2 refills | Status: DC

## 2021-09-15 NOTE — Progress Notes (Signed)
° °  Subjective:    Patient ID: Tanner Hensley, male    DOB: Jan 22, 1941, 81 y.o.   MRN: [id number]  HPI Here with is wife to discuss a syncopal episode that occurred at home on 09-09-21. He had gone to the bathroom to have a BM, and after hearing some strange noises his wife went in to check on him. She found him sitting on the toilet, leaning up against a wall. He had his eyes open but he would not respond to her. His right arm and right leg were shaking. She called EMS, and when she checked him a few minutes later the shaking had stopped. At this point he was still unresponsive to her. The EMS crew arrived about 15 minutes later, and by the time they examined him he was responsive and answering questions. There was no tongue biting. The EMS team examined him and found nothing wrong. His vital signs were normal and his glucose was 185. They then left and advised him to see his PCP. His wife says he was "still a bit out of it" for the next 3 hours, and he did not seem to be back to his baseline for about 3 days. She says he has "passed out" in the bathroom several times before, but during these times he was briefly unconscious and he completely recovered with 30 minutes or so. She has never seen him shake like this. Now for the past week he has seemed totally normal. He feels fine.    Review of Systems  Constitutional: Negative.   Respiratory: Negative.    Cardiovascular: Negative.   Neurological: Negative.       Objective:   Physical Exam Constitutional:      Appearance: Normal appearance. He is not ill-appearing.  Cardiovascular:     Rate and Rhythm: Normal rate. Rhythm irregular.     Pulses: Normal pulses.     Heart sounds: Normal heart sounds.  Pulmonary:     Effort: Pulmonary effort is normal.     Breath sounds: Normal breath sounds.  Neurological:     General: No focal deficit present.     Mental Status: He is alert and oriented to person, place, and time. Mental status is at baseline.           Assessment & Plan:  It seems he had another vasovagal episode while passing a BM, and this in itself is not worrisome. However this episode was different in a number of ways. He could have had a seizure or even a pseudoseizure. We will set him up for a  head CT without contrast, and we will refer him to Neurology to evaluate. (We will not order an MRI because he has a pacemaker.)  We spent a total of ( 34  ) minutes reviewing records and discussing these issues.  Alysia Penna, MD

## 2021-09-16 ENCOUNTER — Ambulatory Visit: Payer: Medicare Other | Admitting: Family Medicine

## 2021-09-16 ENCOUNTER — Telehealth: Payer: Self-pay | Admitting: Family Medicine

## 2021-09-16 NOTE — Telephone Encounter (Signed)
Patient wife stated that the patient and Dr.Fry discussed neurology and she called back to provide the name of the last neurology the patient been to. The patient last seen Dr.Penumalli at [facility].  Patient could be contacted at [phone].  Please advise.

## 2021-09-16 NOTE — Telephone Encounter (Signed)
I did a referral to Dr. Felecia Shelling for him yesterday, but now he wants to see Dr. Leta Baptist instead (he saw him in the past). Please change the referral to reflect this, thanks

## 2021-09-16 NOTE — Telephone Encounter (Signed)
Requesting neurology referral.  Last neurology referral 04/13/2018.

## 2021-09-18 ENCOUNTER — Other Ambulatory Visit: Payer: Self-pay

## 2021-09-18 ENCOUNTER — Ambulatory Visit (INDEPENDENT_AMBULATORY_CARE_PROVIDER_SITE_OTHER)
Admission: RE | Admit: 2021-09-18 | Discharge: 2021-09-18 | Disposition: A | Discharge status: Home / Self Care | Payer: Medicare Other | Source: Ambulatory Visit | Attending: Family Medicine | Admitting: Family Medicine

## 2021-09-18 DIAGNOSIS — R569 Unspecified convulsions: Secondary | ICD-10-CM | POA: Diagnosis not present

## 2021-09-18 DIAGNOSIS — R55 Syncope and collapse: Secondary | ICD-10-CM

## 2021-09-18 DIAGNOSIS — I6381 Other cerebral infarction due to occlusion or stenosis of small artery: Secondary | ICD-10-CM | POA: Diagnosis not present

## 2021-09-18 NOTE — Telephone Encounter (Signed)
Referral sent  to work - queue  To schedule  [facility] Phone: [phone] Their office will contact pt to schedule directly

## 2021-09-23 ENCOUNTER — Ambulatory Visit: Payer: Medicare Other

## 2021-09-23 ENCOUNTER — Encounter: Payer: Self-pay | Admitting: Family Medicine

## 2021-09-28 ENCOUNTER — Ambulatory Visit (INDEPENDENT_AMBULATORY_CARE_PROVIDER_SITE_OTHER): Payer: Medicare Other

## 2021-09-28 DIAGNOSIS — Z7901 Long term (current) use of anticoagulants: Secondary | ICD-10-CM | POA: Diagnosis not present

## 2021-09-28 LAB — POCT INR: INR: 3.2 — AB (ref 2.0–3.0)

## 2021-09-28 NOTE — Progress Notes (Signed)
Decrease dose tomorrow to take 1/2 tablet and then continue to take 1 tablet daily except take 1/2 tablet on Mon, Wed, and Saturdays.  Re-check in 4 weeks.

## 2021-09-28 NOTE — Patient Instructions (Addendum)
Pre visit review using our clinic review tool, if applicable. No additional management support is needed unless otherwise documented below in the visit note.  Decrease dose tomorrow to take 1/2 tablet and then continue to take 1 tablet daily except take 1/2 tablet on Mon, Wed, and Saturdays.  Re-check in 4 weeks.

## 2021-09-30 ENCOUNTER — Telehealth: Payer: Self-pay

## 2021-09-30 ENCOUNTER — Ambulatory Visit: Payer: Medicare Other

## 2021-09-30 NOTE — Telephone Encounter (Signed)
Unsuccessful attempt to reach patient on preferred number listed in notes for scheduled AWV. Left message on voicemail. Okay to reschedule

## 2021-09-30 NOTE — Telephone Encounter (Signed)
Unsuccessful attempt to reach patient on preferred number listed in notes for scheduled AWV. Left message on voicemail okay to reschedule. 

## 2021-10-07 ENCOUNTER — Ambulatory Visit (INDEPENDENT_AMBULATORY_CARE_PROVIDER_SITE_OTHER): Payer: Medicare Other

## 2021-10-07 VITALS — Ht 65.0 in | Wt 160.0 lb

## 2021-10-07 DIAGNOSIS — Z0000 Encounter for general adult medical examination without abnormal findings: Secondary | ICD-10-CM | POA: Diagnosis not present

## 2021-10-07 NOTE — Progress Notes (Signed)
Subjective:   VIRGLE ARTH is a 81 y.o. male who presents for Medicare Annual/Subsequent preventive examination.  Review of Systems    Virtual Visit via Telephone Note  I connected with  [name] on 10/07/21 at 10:30 AM EST by telephone and verified that I am speaking with the correct person using two identifiers.  Location: Patient: Home Provider: Office Persons participating in the virtual visit: patient/Nurse Health Advisor   I discussed the limitations, risks, security and privacy concerns of performing an evaluation and management service by telephone and the availability of in person appointments. The patient expressed understanding and agreed to proceed.  Interactive audio and video telecommunications were attempted between this nurse and patient, however failed, due to patient having technical difficulties OR patient did not have access to video capability.  We continued and completed visit with audio only.  Some vital signs may be absent or patient reported.   Criselda Peaches, LPN  Cardiac Risk Factors include: advanced age (>23men, >56 women);hypertension     Objective:    Today's Vitals   10/07/21 1037  Weight: 160 lb (72.6 kg)  Height: 5\' 5"  (1.651 m)   Body mass index is 26.63 kg/m.  Advanced Directives 10/07/2021 11/05/2020 10/25/2020 10/25/2020 09/24/2020 10/14/2016 10/14/2016  Does Patient Have a Medical Advance Directive? Yes No Yes Yes Yes No Yes  Type of Paramedic of [place];Living will - [place];Living will [place];Living will [place];Living will - Living will  Does patient want to make changes to medical advance directive? No - Patient declined No - Patient declined No - Patient declined - - No - Patient declined No - Patient declined  Copy of [place] in Chart? No - copy requested - No - copy requested - No - copy requested - -  Would patient  like information on creating a medical advance directive? - No - Patient declined - - - No - Patient declined -    Current Medications (verified) Outpatient Encounter Medications as of 10/07/2021  Medication Sig   acetaminophen (TYLENOL) 500 MG tablet Take 1,000 mg by mouth every 4 (four) hours as needed for mild pain.   ascorbic acid (VITAMIN C) 500 MG tablet Take 500 mg by mouth daily.   aspirin EC 81 MG tablet Take 81 mg by mouth daily.   atenolol (TENORMIN) 25 MG tablet Take 25 mg by mouth daily.   b complex vitamins tablet Take 1 tablet by mouth daily.   benzonatate (TESSALON) 200 MG capsule Take 1 capsule (200 mg total) by mouth every 6 (six) hours as needed for cough.   Biotin 1000 MCG tablet Take 5,000 mcg by mouth daily.    calcium citrate-vitamin D (CITRACAL+D) 315-200 MG-UNIT tablet Take 1 tablet by mouth 2 (two) times daily.   Cholecalciferol (VITAMIN D3 PO) Take by mouth daily at 2 PM.   donepezil (ARICEPT) 10 MG tablet TAKE 1 TABLET BY MOUTH AT  BEDTIME   memantine (NAMENDA) 10 MG tablet TAKE 1 TABLET BY MOUTH  TWICE DAILY   Multiple Vitamin (MULTIVITAMIN WITH MINERALS) TABS tablet Take 1 tablet by mouth daily. SENIOR MULTIVITAMIN   solifenacin (VESICARE) 10 MG tablet Take 1 tablet (10 mg total) by mouth daily.   warfarin (COUMADIN) 5 MG tablet TAKE 1 TABLET BY MOUTH  DAILY EXCEPT 1.5 TABLETS ON WEDNESDAY AND SATURDAY OR  AS DIRECTED BY  ANTICOAGULATION CLINIC   No facility-administered encounter medications  on file as of 10/07/2021.    Allergies (verified) Codeine, Hydrocodone-acetaminophen, Tramadol, Lipitor [atorvastatin], Robaxin [methocarbamol], and Zocor [simvastatin]   History: Past Medical History:  Diagnosis Date   Arthritis    sees Dr. Charlestine Night   Atrial fibrillation [facility])    (Dr. Phylliss Blakes at [facility] and Dr. Lovena Le at [facility]. Goes to [facility] polymyalgia rheumatica (Dr. Charlestine Night)   CHF (congestive heart failure) ([place])    NOT SURE WHEN    Coronary artery disease    Diabetes mellitus type II    DIET CONTROLLED AND LOST 40 LBS   Dysrhythmia    chronic a=fib with cardiac ablations   GERD (gastroesophageal reflux disease)    Gout    High cholesterol    Hypertension    Insomnia    Memory loss    Neuromuscular disorder ([place])    POLY MYALGIA   RHEUMATICA   Polymyalgia rheumatica ([place])    sees Dr. Charlestine Night    Presence of permanent cardiac pacemaker    [employer]  08/2011   Urticaria    Past Surgical History:  Procedure Laterality Date   ATRIAL FIBRILLATION ABLATION  2006, 2008, 2013   at [facility]  by Dr. Kathrynn Ducking SURGERY     CARDIAC CATHETERIZATION     has had multiple, but last one 2017 (10/13/2016)   COLONOSCOPY W/ BIOPSIES AND POLYPECTOMY  04/05/2017   per Dr. Oretha Caprice, no polyps, no repeats needed    CORONARY ANGIOPLASTY WITH STENT PLACEMENT  ~ 2014   "he has 2 stents" (10/13/2016)   ESOPHAGOGASTRODUODENOSCOPY     INGUINAL HERNIA REPAIR Right 11/28/2009   per Dr. Donnie Mesa   KNEE ARTHROSCOPY Left 02/03/2010   per Dr. Lindwood Qua   POSTERIOR LUMBAR FUSION  10/14/2016   SPINE SURGERY  10/13/2016   per Dr. Lynann Bologna, L3-4 fusion   TONSILLECTOMY     Family History  Problem Relation Age of Onset   Coronary artery disease Other    Hypertension Other    Heart disease Other    Heart disease Mother    Heart attack Father    Heart disease Sister    Diabetes Sister    Hypertension Sister    Other Brother        brain tumor   Hypertension Brother    Heart disease Brother    Social History   Socioeconomic History   Marital status: Married    Spouse name: [name]   Number of children: 3   Years of education: 14   Highest education level: Not on file  Occupational History    Comment: retired  Tobacco Use   Smoking status: Never   Smokeless tobacco: Never  Vaping Use   Vaping Use: Never used  Substance and Sexual Activity   Alcohol use: No    Alcohol/week: 0.0 standard  drinks   Drug use: No   Sexual activity: Never  Other Topics Concern   Not on file  Social History Narrative   Lives with wife, at home   Caffeine- coffee, 2 cups, Dr Malachi Bonds 8 oz   Social Determinants of Health   Financial Resource Strain: Low Risk    Difficulty of Paying Living Expenses: Not hard at all  Food Insecurity: No Food Insecurity   Worried About Charity fundraiser in the Last Year: Never true   [place] in the Last Year: Never true  Transportation Needs: No Transportation Needs   Lack of Transportation (Medical):  No   Lack of Transportation (Non-Medical): No  Physical Activity: Sufficiently Active   Days of Exercise per Week: 3 days   Minutes of Exercise per Session: 60 min  Stress: No Stress Concern Present   Feeling of Stress : Not at all  Social Connections: Socially Integrated   Frequency of Communication with Friends and Family: More than three times a week   Frequency of Social Gatherings with Friends and Family: More than three times a week   Attends Religious Services: More than 4 times per year   Active Member of [employer] or Organizations: Yes   Attends Music therapist: More than 4 times per year   Marital Status: Married    Clinical Intake:  Pre-visit preparation completed: Yes  Pain : No/denies pain   BMI - recorded: 26.63 Nutritional Status: BMI 25 -29 Overweight Nutritional Risks: None Diabetes: Yes CBG done?: No Did pt. bring in CBG monitor from home?: No  How often do you need to have someone help you when you read instructions, pamphlets, or other written materials from your doctor or pharmacy?: 1 - Never  Diabetic?  No  Interpreter Needed?: No  Information entered by :: Weston Brass   Activities of Daily Living In your present state of health, do you have any difficulty performing the following activities: 10/07/2021 10/25/2020  Hearing? N N  Vision? N N  Difficulty concentrating or making decisions? N N   Walking or climbing stairs? N N  Dressing or bathing? N N  Doing errands, shopping? N N  Preparing Food and eating ? N -  Using the Toilet? N -  In the past six months, have you accidently leaked urine? N -  Do you have problems with loss of bowel control? N -  Managing your Medications? N -  Managing your Finances? N -  Housekeeping or managing your Housekeeping? N -  Some recent data might be hidden    Patient Care Team: Laurey Morale, MD as PCP - General (Family Medicine) Ebbie Ridge, MD as Referring Physician (Cardiology) Viona Gilmore, [facility] as Pharmacist (Pharmacist)  Indicate any recent Medical Services you may have received from other than [facility] providers in the past year (date may be approximate).     Assessment:   This is a routine wellness examination for [place].  Hearing/Vision screen Hearing Screening - Comments:: No difficulty hearing Vision Screening - Comments:: Wears glasses. Followed by Dr Kathlen Mody  Dietary issues and exercise activities discussed: Current Exercise Habits: Home exercise routine, Type of exercise: walking, Time (Minutes): 60, Frequency (Times/Week): 3, Weekly Exercise (Minutes/Week): 180, Intensity: Moderate, Exercise limited by: None identified   Goals Addressed             This Visit's Progress    Patient Stated       Continue playing golf.       Depression Screen PHQ 2/9 Scores 10/07/2021 09/15/2021 08/11/2021 08/11/2021 09/24/2020 06/24/2020 06/24/2020  PHQ - 2 Score 0 0 0 0 0 0 0  PHQ- 9 Score 0 2 3 0 - 0 -  Exception Documentation - - - - - Medical reason Medical reason    Fall Risk Fall Risk  10/07/2021 09/15/2021 08/11/2021 08/11/2021 10/15/2020  Falls in the past year? 0 0 0 0 0  Number falls in past yr: 0 0 0 0 0  Injury with Fall? 0 0 0 - 0  Risk for fall due to : No Fall Risks No Fall Risks No Fall  Risks No Fall Risks -  Follow up - - Falls evaluation completed Falls evaluation completed -    FALL RISK PREVENTION  PERTAINING TO THE HOME:  Any stairs in or around the home? Yes  If so, are there any without handrails? No  Home free of loose throw rugs in walkways, pet beds, electrical cords, etc? Yes  Adequate lighting in your home to reduce risk of falls? Yes   ASSISTIVE DEVICES UTILIZED TO PREVENT FALLS:  Life alert? No  Use of a cane, walker or w/c? Yes  Grab bars in the bathroom? No  Shower chair or bench in shower? Yes  Elevated toilet seat or a handicapped toilet? Yes   TIMED UP AND GO:  Was the test performed? No . Audio Visit  Cognitive Function: MMSE - Mini Mental State Exam 06/05/2018  Orientation to time 3  Orientation to Place 4  Registration 3  Attention/ Calculation 1  Recall 2  Language- name 2 objects 2  Language- repeat 0  Language- follow 3 step command 3  Language- read & follow direction 1  Write a sentence 1  Copy design 1  Total score 21   Montreal Cognitive Assessment  11/14/2014  Visuospatial/ Executive (0/5) 5  Naming (0/3) 3  Attention: Read list of digits (0/2) 2  Attention: Read list of letters (0/1) 1  Attention: Serial 7 subtraction starting at 100 (0/3) 3  Language: Repeat phrase (0/2) 2  Language : Fluency (0/1) 1  Abstraction (0/2) 2  Delayed Recall (0/5) 4  Orientation (0/6) 6  Total 29  Adjusted Score (based on education) 29   6CIT Screen 10/07/2021 09/24/2020  What Year? 0 points 0 points  What month? 0 points 0 points  What time? 0 points -  Count back from 20 0 points 0 points  Months in reverse 0 points 0 points  Repeat phrase 0 points 0 points  Total Score 0 -    Immunizations Immunization History  Administered Date(s) Administered   Fluad Quad(high Dose 65+) 06/15/2019, 06/24/2020, 07/15/2021   Influenza Split 06/07/2011, 06/21/2012   Influenza Whole 07/07/2005, 06/09/2007, 06/07/2008, 06/18/2009, 07/13/2010   Influenza, High Dose Seasonal PF 06/15/2016, 06/23/2017, 07/24/2018   Influenza,inj,Quad PF,6+ Mos 07/26/2013,  05/20/2014, 04/28/2015   Influenza-Unspecified 06/07/2011, 06/21/2012, 07/26/2013, 05/20/2014, 04/28/2015, 06/15/2016, 06/23/2017, 07/24/2018   Pneumococcal Conjugate-13 08/09/2014   Pneumococcal Polysaccharide-23 07/20/2012   Pneumococcal-Unspecified 07/20/2012, 08/09/2014   Td 07/13/2010   Tdap 07/13/2010    TDAP status: Up to date  Flu Vaccine status: Up to date  Pneumococcal vaccine status: Up to date  Covid-19 vaccine status: Declined, Education has been provided regarding the importance of this vaccine but patient still declined. Advised may receive this vaccine at local pharmacy or Health Dept.or vaccine clinic. Aware to provide a copy of the vaccination record if obtained from local pharmacy or Health Dept. Verbalized acceptance and understanding.  Qualifies for Shingles Vaccine? Yes   Zostavax completed No   Shingrix Completed?: No.    Education has been provided regarding the importance of this vaccine. Patient has been advised to call insurance company to determine out of pocket expense if they have not yet received this vaccine. Advised may also receive vaccine at local pharmacy or Health Dept. Verbalized acceptance and understanding.  Screening Tests Health Maintenance  Topic Date Due   FOOT EXAM  Never done   URINE MICROALBUMIN  08/10/2015   TETANUS/TDAP  07/13/2020   OPHTHALMOLOGY EXAM  10/06/2021   COVID-19 Vaccine (1) 10/23/2021 (Originally  02/21/1942)   Zoster Vaccines- Shingrix (1 of 2) 01/04/2022 (Originally 08/24/1960)   HEMOGLOBIN A1C  02/09/2022   Pneumonia Vaccine 11+ Years old  Completed   INFLUENZA VACCINE  Completed   HPV VACCINES  Aged Out    Health Maintenance  Health Maintenance Due  Topic Date Due   FOOT EXAM  Never done   URINE MICROALBUMIN  08/10/2015   TETANUS/TDAP  07/13/2020   OPHTHALMOLOGY EXAM  10/06/2021     Additional Screening:  Vision Screening: Recommended annual ophthalmology exams for early detection of glaucoma and other  disorders of the eye. Is the patient up to date with their annual eye exam?  Yes  Who is the provider or what is the name of the office in which the patient attends annual eye exams? Dr Kathlen Mody If pt is not established with a provider, would they like to be referred to a provider to establish care? No .   Dental Screening: Recommended annual dental exams for proper oral hygiene  Community Resource Referral / Chronic Care Management: CRR required this visit?  No   CCM required this visit?  No      Plan:     I have personally reviewed and noted the following in the patients chart:   Medical and social history Use of alcohol, tobacco or illicit drugs  Current medications and supplements including opioid prescriptions. Patient is not currently taking opioid prescriptions. Functional ability and status Nutritional status Physical activity Advanced directives List of other physicians Hospitalizations, surgeries, and ER visits in previous 12 months Vitals Screenings to include cognitive, depression, and falls Referrals and appointments  In addition, I have reviewed and discussed with patient certain preventive protocols, quality metrics, and best practice recommendations. A written personalized care plan for preventive services as well as general preventive health recommendations were provided to patient.     Criselda Peaches, LPN   11/12/298   Nurse Notes: None

## 2021-10-07 NOTE — Patient Instructions (Addendum)
Tanner Hensley , Thank you for taking time to come for your Medicare Wellness Visit. I appreciate your ongoing commitment to your health goals. Please review the following plan we discussed and let me know if I can assist you in the future.   These are the goals we discussed:  Goals      Patient Stated     Continue playing golf.        This is a list of the screening recommended for you and due dates:  Health Maintenance  Topic Date Due   Complete foot exam   Never done   Urine Protein Check  08/10/2015   Tetanus Vaccine  07/13/2020   Eye exam for diabetics  10/06/2021   COVID-19 Vaccine (1) 10/23/2021*   Zoster (Shingles) Vaccine (1 of 2) 01/04/2022*   Hemoglobin A1C  02/09/2022   Pneumonia Vaccine  Completed   Flu Shot  Completed   HPV Vaccine  Aged Out  *Topic was postponed. The date shown is not the original due date.     Advanced directives: Yes   Conditions/risks identified: None  Next appointment: Follow up in one year for your annual wellness visit.   Preventive Care 82 Years and Older, Male Preventive care refers to lifestyle choices and visits with your health care provider that can promote health and wellness. What does preventive care include? A yearly physical exam. This is also called an annual well check. Dental exams once or twice a year. Routine eye exams. Ask your health care provider how often you should have your eyes checked. Personal lifestyle choices, including: Daily care of your teeth and gums. Regular physical activity. Eating a healthy diet. Avoiding tobacco and drug use. Limiting alcohol use. Practicing safe sex. Taking low doses of aspirin every day. Taking vitamin and mineral supplements as recommended by your health care provider. What happens during an annual well check? The services and screenings done by your health care provider during your annual well check will depend on your age, overall health, lifestyle risk factors, and family  history of disease. Counseling  Your health care provider may ask you questions about your: Alcohol use. Tobacco use. Drug use. Emotional well-being. Home and relationship well-being. Sexual activity. Eating habits. History of falls. Memory and ability to understand (cognition). Work and work Statistician. Screening  You may have the following tests or measurements: Height, weight, and BMI. Blood pressure. Lipid and cholesterol levels. These may be checked every 5 years, or more frequently if you are over 53 years old. Skin check. Lung cancer screening. You may have this screening every year starting at age 70 if you have a 30-pack-year history of smoking and currently smoke or have quit within the past 15 years. Fecal occult blood test (FOBT) of the stool. You may have this test every year starting at age 25. Flexible sigmoidoscopy or colonoscopy. You may have a sigmoidoscopy every 5 years or a colonoscopy every 10 years starting at age 86. Prostate cancer screening. Recommendations will vary depending on your family history and other risks. Hepatitis C blood test. Hepatitis B blood test. Sexually transmitted disease (STD) testing. Diabetes screening. This is done by checking your blood sugar (glucose) after you have not eaten for a while (fasting). You may have this done every 1-3 years. Abdominal aortic aneurysm (AAA) screening. You may need this if you are a current or former smoker. Osteoporosis. You may be screened starting at age 76 if you are at high risk. Talk with your  health care provider about your test results, treatment options, and if necessary, the need for more tests. Vaccines  Your health care provider may recommend certain vaccines, such as: Influenza vaccine. This is recommended every year. Tetanus, diphtheria, and acellular pertussis (Tdap, Td) vaccine. You may need a Td booster every 10 years. Zoster vaccine. You may need this after age 5. Pneumococcal  13-valent conjugate (PCV13) vaccine. One dose is recommended after age 48. Pneumococcal polysaccharide (PPSV23) vaccine. One dose is recommended after age 68. Talk to your health care provider about which screenings and vaccines you need and how often you need them. This information is not intended to replace advice given to you by your health care provider. Make sure you discuss any questions you have with your health care provider. Document Released: 09/19/2015 Document Revised: 05/12/2016 Document Reviewed: 06/24/2015 Elsevier Interactive Patient Education  2017 [place] Prevention in the Home Falls can cause injuries. They can happen to people of all ages. There are many things you can do to make your home safe and to help prevent falls. What can I do on the outside of my home? Regularly fix the edges of walkways and driveways and fix any cracks. Remove anything that might make you trip as you walk through a door, such as a raised step or threshold. Trim any bushes or trees on the path to your home. Use bright outdoor lighting. Clear any walking paths of anything that might make someone trip, such as rocks or tools. Regularly check to see if handrails are loose or broken. Make sure that both sides of any steps have handrails. Any raised decks and porches should have guardrails on the edges. Have any leaves, snow, or ice cleared regularly. Use sand or salt on walking paths during winter. Clean up any spills in your garage right away. This includes oil or grease spills. What can I do in the bathroom? Use night lights. Install grab bars by the toilet and in the tub and shower. Do not use towel bars as grab bars. Use non-skid mats or decals in the tub or shower. If you need to sit down in the shower, use a plastic, non-slip stool. Keep the floor dry. Clean up any water that spills on the floor as soon as it happens. Remove soap buildup in the tub or shower regularly. Attach  bath mats securely with double-sided non-slip rug tape. Do not have throw rugs and other things on the floor that can make you trip. What can I do in the bedroom? Use night lights. Make sure that you have a light by your bed that is easy to reach. Do not use any sheets or blankets that are too big for your bed. They should not hang down onto the floor. Have a firm chair that has side arms. You can use this for support while you get dressed. Do not have throw rugs and other things on the floor that can make you trip. What can I do in the kitchen? Clean up any spills right away. Avoid walking on wet floors. Keep items that you use a lot in easy-to-reach places. If you need to reach something above you, use a strong step stool that has a grab bar. Keep electrical cords out of the way. Do not use floor polish or wax that makes floors slippery. If you must use wax, use non-skid floor wax. Do not have throw rugs and other things on the floor that can make you trip. What  can I do with my stairs? Do not leave any items on the stairs. Make sure that there are handrails on both sides of the stairs and use them. Fix handrails that are broken or loose. Make sure that handrails are as long as the stairways. Check any carpeting to make sure that it is firmly attached to the stairs. Fix any carpet that is loose or worn. Avoid having throw rugs at the top or bottom of the stairs. If you do have throw rugs, attach them to the floor with carpet tape. Make sure that you have a light switch at the top of the stairs and the bottom of the stairs. If you do not have them, ask someone to add them for you. What else can I do to help prevent falls? Wear shoes that: Do not have high heels. Have rubber bottoms. Are comfortable and fit you well. Are closed at the toe. Do not wear sandals. If you use a stepladder: Make sure that it is fully opened. Do not climb a closed stepladder. Make sure that both sides of the  stepladder are locked into place. Ask someone to hold it for you, if possible. Clearly mark and make sure that you can see: Any grab bars or handrails. First and last steps. Where the edge of each step is. Use tools that help you move around (mobility aids) if they are needed. These include: Canes. Walkers. Scooters. Crutches. Turn on the lights when you go into a dark area. Replace any light bulbs as soon as they burn out. Set up your furniture so you have a clear path. Avoid moving your furniture around. If any of your floors are uneven, fix them. If there are any pets around you, be aware of where they are. Review your medicines with your doctor. Some medicines can make you feel dizzy. This can increase your chance of falling. Ask your doctor what other things that you can do to help prevent falls. This information is not intended to replace advice given to you by your health care provider. Make sure you discuss any questions you have with your health care provider. Document Released: 06/19/2009 Document Revised: 01/29/2016 Document Reviewed: 09/27/2014 Elsevier Interactive Patient Education  2017 [employer].

## 2021-10-11 ENCOUNTER — Encounter: Payer: Self-pay | Admitting: Family Medicine

## 2021-10-12 NOTE — Telephone Encounter (Signed)
Yes, try taking only 1/2 a tablet per day

## 2021-10-21 DIAGNOSIS — M0609 Rheumatoid arthritis without rheumatoid factor, multiple sites: Secondary | ICD-10-CM | POA: Diagnosis not present

## 2021-10-21 DIAGNOSIS — M353 Polymyalgia rheumatica: Secondary | ICD-10-CM | POA: Diagnosis not present

## 2021-10-21 DIAGNOSIS — M79641 Pain in right hand: Secondary | ICD-10-CM | POA: Diagnosis not present

## 2021-10-21 DIAGNOSIS — M79642 Pain in left hand: Secondary | ICD-10-CM | POA: Diagnosis not present

## 2021-10-21 DIAGNOSIS — M150 Primary generalized (osteo)arthritis: Secondary | ICD-10-CM | POA: Diagnosis not present

## 2021-10-28 ENCOUNTER — Ambulatory Visit: Payer: Medicare Other

## 2021-10-28 ENCOUNTER — Telehealth: Payer: Self-pay

## 2021-10-28 NOTE — Telephone Encounter (Signed)
Pt LVM he needs to RS apt due to not feeling well.  LVM to contact coumadin clinic to RS with phone number.

## 2021-11-03 NOTE — Telephone Encounter (Signed)
LVM

## 2021-11-06 ENCOUNTER — Ambulatory Visit: Payer: Medicare Other | Admitting: Podiatry

## 2021-11-06 ENCOUNTER — Other Ambulatory Visit: Payer: Self-pay

## 2021-11-06 ENCOUNTER — Ambulatory Visit: Payer: Medicare Other | Admitting: Diagnostic Neuroimaging

## 2021-11-06 ENCOUNTER — Encounter: Payer: Self-pay | Admitting: Diagnostic Neuroimaging

## 2021-11-06 VITALS — BP 137/78 | HR 60 | Ht 65.0 in | Wt 163.0 lb

## 2021-11-06 DIAGNOSIS — M79675 Pain in left toe(s): Secondary | ICD-10-CM

## 2021-11-06 DIAGNOSIS — B351 Tinea unguium: Secondary | ICD-10-CM

## 2021-11-06 DIAGNOSIS — Z7901 Long term (current) use of anticoagulants: Secondary | ICD-10-CM | POA: Diagnosis not present

## 2021-11-06 DIAGNOSIS — R55 Syncope and collapse: Secondary | ICD-10-CM

## 2021-11-06 DIAGNOSIS — M79674 Pain in right toe(s): Secondary | ICD-10-CM | POA: Diagnosis not present

## 2021-11-06 DIAGNOSIS — L84 Corns and callosities: Secondary | ICD-10-CM | POA: Diagnosis not present

## 2021-11-06 NOTE — Patient Instructions (Addendum)
?  VASOVAGAL SYNCOPE x 4 (prodromal lightheadedness, situational on commode; no post-ictal confusion; mild tremors likely related to hypoperfusion, no convulsions) ?- monitor BP at home; doubt seizures ?- advised for using technology to help at home (smart speaker, wearables, life alert) ?- no driving ? ?MEMORY LOSS (suspected mild dementia) ?- continue memantine 10mg  twice a day + donepezil 10mg  at bedtime ?- safety / supervision issues reviewed ?- daily physical activity / exercise (at least 15-30 minutes) ?- eat more plants / vegetables ?- increase social activities, brain stimulation, games, puzzles, hobbies, crafts, arts, music ?- aim for at least 7-8 hours sleep per night (or more) ?- avoid smoking and alcohol ?- caution with medications, finances; no driving ?

## 2021-11-06 NOTE — Progress Notes (Signed)
[facility]  PATIENT: Tanner Hensley DOB: 11-29-1940  REFERRING CLINICIAN: Laurey Morale, MD  HISTORY FROM: patient  REASON FOR VISIT: new consult    HISTORICAL  CHIEF COMPLAINT:  Chief Complaint  Patient presents with   Loss of Consciousness    RM 7 with spouse [name] Pt is well, spouse states pt has had about four syncope episodes for about two yrs. Not sure if its seizures.     HISTORY OF PRESENT ILLNESS:   UPDATE (11/06/21, VRP): 81 year old male with recurrent syncope since 2021. Had 4 episodes of syncope, while on commode. Last event in Jan 2023. All episodes start with lightheaded, dizziness, then LOC x 2-3 minutes. All events associated with very low BP initially (SBP 50-60), then rebound high BP over next few hours up to (SBP 200-210). Then fluctuations over next few days. EMS called for all events. No tongue biting or convulsions. Mild fatigue and weakness afterwards. Last event in Jan 2023 was slightly different, with partial LOC, and mild tremors on right side noted (patient was slumped to left with left arm and leg resting against sink, counter).   PRIOR HPI (06/05/18): 81 year old male here for evaluation of memory problems.  Patient has had memory problems since 2012, with short-term forgetfulness, difficulty with driving directions, difficulty making decisions, difficulty with new situations and tasks.  Symptoms are worse with stress and aggravation.  Some agitation.  Some slower cognitive processes.  He was about it by another neurologist in 2015, diagnosed with MCI, and offered Aricept.  Patient took this for 1 weeks and had to stop due to side effects.  2017 patient was tried on memantine by PCP and was able to tolerate this.  In 2018 he was retried on donepezil and did well on it.  Patient still able to do lots of his activities of daily living but needs more help with that.  He still drives, only with short distances in familiar  locations.   REVIEW OF SYSTEMS: Full 14 system review of systems performed and negative with exception of: Restless legs memory loss confusion easy bruising cramps joint pain hearing loss runny nose.   ALLERGIES: Allergies  Allergen Reactions   Codeine Rash and Other (See Comments)    Dizziness, low blood pressure and heart rate   Hydrocodone-Acetaminophen Rash and Other (See Comments)    Dizziness, low blood pressure and heart rate   Tramadol Nausea And Vomiting   Lipitor [Atorvastatin] Other (See Comments)    Myalgias    Robaxin [Methocarbamol] Other (See Comments)    Dropped blood pressure too much   Zocor [Simvastatin] Other (See Comments)    myalgias    HOME MEDICATIONS: Outpatient Medications Prior to Visit  Medication Sig Dispense Refill   acetaminophen (TYLENOL) 500 MG tablet Take 1,000 mg by mouth every 4 (four) hours as needed for mild pain.     ascorbic acid (VITAMIN C) 500 MG tablet Take 500 mg by mouth daily.     aspirin EC 81 MG tablet Take 81 mg by mouth daily.     atenolol (TENORMIN) 25 MG tablet Take 25 mg by mouth daily.  0   b complex vitamins tablet Take 1 tablet by mouth daily.     benzonatate (TESSALON) 200 MG capsule Take 1 capsule (200 mg total) by mouth every 6 (six) hours as needed for cough. 60 capsule 2   Biotin 5000 MCG CAPS Take 5,000 mcg by mouth daily.  CALCIUM CITRATE-VITAMIN D PO Take 1 tablet by mouth 2 (two) times daily. [phone]     Cholecalciferol (VITAMIN D3 PO) Take by mouth daily at 2 PM.     donepezil (ARICEPT) 10 MG tablet TAKE 1 TABLET BY MOUTH AT  BEDTIME 90 tablet 1   memantine (NAMENDA) 10 MG tablet TAKE 1 TABLET BY MOUTH  TWICE DAILY 180 tablet 3   Multiple Vitamin (MULTIVITAMIN WITH MINERALS) TABS tablet Take 1 tablet by mouth daily. SENIOR MULTIVITAMIN     solifenacin (VESICARE) 10 MG tablet Take 1 tablet (10 mg total) by mouth daily. (Patient taking differently: Take 5 mg by mouth daily.) 30 tablet 2   warfarin  (COUMADIN) 5 MG tablet TAKE 1 TABLET BY MOUTH  DAILY EXCEPT 1.5 TABLETS ON WEDNESDAY AND SATURDAY OR  AS DIRECTED BY  ANTICOAGULATION CLINIC 105 tablet 3   No facility-administered medications prior to visit.    PAST MEDICAL HISTORY: Past Medical History:  Diagnosis Date   Arthritis    sees Dr. Charlestine Night   Atrial fibrillation [facility])    (Dr. Phylliss Blakes at [facility] and Dr. Lovena Le at [facility]. Goes to Coumadin Clinic polymyalgia rheumatica (Dr. Charlestine Night)   CHF (congestive heart failure) ([place])    NOT SURE WHEN   Coronary artery disease    Diabetes mellitus type II    DIET CONTROLLED AND LOST 40 LBS   Dysrhythmia    chronic a=fib with cardiac ablations   GERD (gastroesophageal reflux disease)    Gout    High cholesterol    Hypertension    Insomnia    Memory loss    Neuromuscular disorder ([place])    POLY MYALGIA   RHEUMATICA   Polymyalgia rheumatica ([place])    sees Dr. Charlestine Night    Presence of permanent cardiac pacemaker    [employer]  08/2011   Urticaria     PAST SURGICAL HISTORY: Past Surgical History:  Procedure Laterality Date   ATRIAL FIBRILLATION ABLATION  2006, 2008, 2013   at [facility]  by Dr. Kathrynn Ducking SURGERY     CARDIAC CATHETERIZATION     has had multiple, but last one 2017 (10/13/2016)   COLONOSCOPY W/ BIOPSIES AND POLYPECTOMY  04/05/2017   per Dr. Oretha Caprice, no polyps, no repeats needed    CORONARY ANGIOPLASTY WITH STENT PLACEMENT  ~ 2014   "he has 2 stents" (10/13/2016)   ESOPHAGOGASTRODUODENOSCOPY     INGUINAL HERNIA REPAIR Right 11/28/2009   per Dr. Donnie Mesa   KNEE ARTHROSCOPY Left 02/03/2010   per Dr. Lindwood Qua   POSTERIOR LUMBAR FUSION  10/14/2016   SPINE SURGERY  10/13/2016   per Dr. Lynann Bologna, L3-4 fusion   TONSILLECTOMY      FAMILY HISTORY: Family History  Problem Relation Age of Onset   Coronary artery disease Other    Hypertension Other    Heart disease Other    Heart disease Mother    Heart attack Father     Heart disease Sister    Diabetes Sister    Hypertension Sister    Other Brother        brain tumor   Hypertension Brother    Heart disease Brother     SOCIAL HISTORY: Social History   Socioeconomic History   Marital status: Married    Spouse name: [name]   Number of children: 3   Years of education: 14   Highest education level: Not on file  Occupational History    Comment: retired  Tobacco Use   Smoking status: Never   Smokeless tobacco: Never  Vaping Use   Vaping Use: Never used  Substance and Sexual Activity   Alcohol use: No    Alcohol/week: 0.0 standard drinks   Drug use: No   Sexual activity: Never  Other Topics Concern   Not on file  Social History Narrative   Lives with wife, at home   Caffeine- coffee, 2 cups, Dr Malachi Bonds 8 oz   Social Determinants of Health   Financial Resource Strain: Low Risk    Difficulty of Paying Living Expenses: Not hard at all  Food Insecurity: No Food Insecurity   Worried About Charity fundraiser in the Last Year: Never true   [place] in the Last Year: Never true  Transportation Needs: No Transportation Needs   Lack of Transportation (Medical): No   Lack of Transportation (Non-Medical): No  Physical Activity: Sufficiently Active   Days of Exercise per Week: 3 days   Minutes of Exercise per Session: 60 min  Stress: No Stress Concern Present   Feeling of Stress : Not at all  Social Connections: Socially Integrated   Frequency of Communication with Friends and Family: More than three times a week   Frequency of Social Gatherings with Friends and Family: More than three times a week   Attends Religious Services: More than 4 times per year   Active Member of [employer] or Organizations: Yes   Attends Music therapist: More than 4 times per year   Marital Status: Married  Human resources officer Violence: Not At Risk   Fear of Current or Ex-Partner: No   Emotionally Abused: No   Physically Abused: No   Sexually  Abused: No     PHYSICAL EXAM  GENERAL EXAM/CONSTITUTIONAL: Vitals:  Vitals:   11/06/21 0925  BP: 137/78  Pulse: 60  Weight: 163 lb (73.9 kg)  Height: 5\' 5"  (1.651 m)   Body mass index is 27.12 kg/m. Wt Readings from Last 3 Encounters:  11/06/21 163 lb (73.9 kg)  10/07/21 160 lb (72.6 kg)  09/15/21 165 lb 2 oz (74.9 kg)   Patient is in no distress; well developed, nourished and groomed; neck is supple  CARDIOVASCULAR: Examination of carotid arteries is normal; no carotid bruits Regular rate and rhythm, no murmurs Examination of peripheral vascular system by observation and palpation is normal  EYES: Ophthalmoscopic exam of optic discs and posterior segments is normal; no papilledema or hemorrhages No results found.  MUSCULOSKELETAL: Gait, strength, tone, movements noted in Neurologic exam below  NEUROLOGIC: MENTAL STATUS:   MMSE - Mini Mental State Exam 06/05/2018  Orientation to time 3  Orientation to Place 4  Registration 3  Attention/ Calculation 1  Recall 2  Language- name 2 objects 2  Language- repeat 0  Language- follow 3 step command 3  Language- read & follow direction 1  Write a sentence 1  Copy design 1  Total score 21   awake, alert, oriented to person, place and time recent and remote memory intact normal attention and concentration language fluent, comprehension intact, naming intact fund of knowledge appropriate  CRANIAL NERVE:  2nd - no papilledema on fundoscopic exam 2nd, 3rd, 4th, 6th - pupils equal and reactive to light, visual fields full to confrontation, extraocular muscles intact, no nystagmus 5th - facial sensation symmetric 7th - facial strength symmetric 8th - hearing intact 9th - palate elevates symmetrically, uvula midline 11th - shoulder shrug symmetric 12th - tongue protrusion  midline  MOTOR:  normal bulk and tone, full strength in the BUE, BLE  SENSORY:  normal and symmetric to light touch, temperature,  vibration  COORDINATION:  finger-nose-finger, fine finger movements normal  REFLEXES:  deep tendon reflexes present and symmetric  GAIT/STATION:  narrow based gait     DIAGNOSTIC DATA (LABS, IMAGING, TESTING) - I reviewed patient records, labs, notes, testing and imaging myself where available.  Lab Results  Component Value Date   WBC 6.8 08/11/2021   HGB 14.2 08/11/2021   HCT 43.1 08/11/2021   MCV 107.0 (H) 08/11/2021   PLT 146.0 (L) 08/11/2021      Component Value Date/Time   NA 141 08/11/2021 1406   K 4.6 08/11/2021 1406   CL 104 08/11/2021 1406   CO2 30 08/11/2021 1406   GLUCOSE 104 (H) 08/11/2021 1406   BUN 18 08/11/2021 1406   CREATININE 1.06 08/11/2021 1406   CREATININE 1.20 (H) 06/24/2020 1104   CALCIUM 10.3 08/11/2021 1406   PROT 6.6 08/11/2021 1406   ALBUMIN 4.4 08/11/2021 1406   AST 28 08/11/2021 1406   ALT 21 08/11/2021 1406   ALKPHOS 63 08/11/2021 1406   BILITOT 0.9 08/11/2021 1406   GFRNONAA >60 11/05/2020 1206   GFRAA >60 10/16/2016 0522   Lab Results  Component Value Date   CHOL 198 08/11/2021   HDL 55.20 08/11/2021   LDLCALC 123 (H) 08/11/2021   LDLDIRECT 176.1 07/20/2012   TRIG 102.0 08/11/2021   CHOLHDL 4 08/11/2021   Lab Results  Component Value Date   HGBA1C 6.1 08/11/2021   Lab Results  Component Value Date   VITAMINB12 341 10/26/2020   Lab Results  Component Value Date   TSH 1.54 08/11/2021    10/13/16 CT head [I reviewed images myself and agree with interpretation. -VRP]  1. No acute intracranial abnormality. 2. Advanced chronic microvascular ischemia.  09/18/21 CT head - No acute intracranial process.  10/26/20 TTE 1. Left ventricular ejection fraction, by estimation, is 60 to 65%. The  left ventricle has normal function. The left ventricle has no regional  wall motion abnormalities. There is mild concentric left ventricular  hypertrophy. Left ventricular diastolic  parameters are consistent with Grade III diastolic  dysfunction  (restrictive). Elevated left atrial pressure.   2. Right ventricular systolic function is normal. The right ventricular  size is normal. There is normal pulmonary artery systolic pressure. The  estimated right ventricular systolic pressure is 62.7 mmHg.   3. Left atrial size was mildly dilated.   4. The mitral valve is normal in structure. Mild mitral valve  regurgitation. No evidence of mitral stenosis.   5. The aortic valve is normal in structure. There is moderate  calcification of the aortic valve. There is moderate thickening of the  aortic valve. Aortic valve regurgitation is mild. Mild aortic valve  stenosis.   6. 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   7. The inferior vena cava is normal in size with greater than 50%  respiratory variability, suggesting right atrial pressure of 3 mmHg.    ASSESSMENT AND PLAN  81 y.o. year old male here with gradual onset progressive short-term memory loss confusion consistent with neurodegenerative dementia.  Dx:  1. Vasovagal syncope      PLAN:  VASOVAGAL SYNCOPE x 4 (prodromal lightheadedness, situational on commode; no post-ictal confusion; mild tremors likely related to hypoperfusion, no convulsions) - monitor BP at home; doubt seizures - advised for using technology  to help at home (smart speaker, wearables, life alert) - no driving  MEMORY LOSS (suspected mild dementia) - continue memantine 10mg  twice a day + donepezil 10mg  at bedtime - safety / supervision issues reviewed - daily physical activity / exercise (at least 15-30 minutes) - eat more plants / vegetables - increase social activities, brain stimulation, games, puzzles, hobbies, crafts, arts, music - aim for at least 7-8 hours sleep per night (or more) - avoid smoking and alcohol - caution with medications, finances; no driving  Return for return to PCP.    Penni Bombard, MD 1/0/2111, 73:56 AM Certified  in Neurology, Neurophysiology and Neuroimaging  [facility] [address], [place], [place] [address] [phone]

## 2021-11-08 DIAGNOSIS — I495 Sick sinus syndrome: Secondary | ICD-10-CM | POA: Diagnosis not present

## 2021-11-08 DIAGNOSIS — Z45018 Encounter for adjustment and management of other part of cardiac pacemaker: Secondary | ICD-10-CM | POA: Diagnosis not present

## 2021-11-09 NOTE — Telephone Encounter (Signed)
It appears pt has been RS for 3/8 ?

## 2021-11-11 ENCOUNTER — Ambulatory Visit (INDEPENDENT_AMBULATORY_CARE_PROVIDER_SITE_OTHER): Payer: Medicare Other

## 2021-11-11 DIAGNOSIS — Z7901 Long term (current) use of anticoagulants: Secondary | ICD-10-CM

## 2021-11-11 LAB — POCT INR: INR: 2.9 (ref 2.0–3.0)

## 2021-11-11 NOTE — Progress Notes (Signed)
Continue to take 1 tablet daily except take 1/2 tablet on Mon, Wed, and Saturdays.  Re-check in 5 weeks. ?

## 2021-11-11 NOTE — Patient Instructions (Addendum)
Pre visit review using our clinic review tool, if applicable. No additional management support is needed unless otherwise documented below in the visit note. ? ?Continue to take 1 tablet daily except take 1/2 tablet on Mon, Wed, and Saturdays.  Re-check in 5 weeks. ?

## 2021-11-15 NOTE — Progress Notes (Signed)
Subjective: ?81 y.o. returns the office today for painful, elongated, thickened toenails which he cannot trim himself as well as for a callus on the tip of the second toe.  He states the nails are growing into the skin causing discomfort, mostly to the left second digit toenail.  Denies any swelling redness or any drainage.  He has no other concerns today.  No recent injuries.  ? ?Currently on warfarin ? ?PCP: Laurey Morale, MD ?Last seen September 15, 2021 ? ?Objective: ?AAO ?3, NAD ?DP/PT pulses palpable, CRT less than 3 seconds ?Nails hypertrophic, dystrophic, elongated, brittle, discolored ?10. There is tenderness overlying the nails 1-5 bilaterally. There is no surrounding erythema or drainage along the nail sites. ?Hyperkeratotic lesion the distal aspect the left second toe from a hammertoe deformity.  There is no underlying ulceration identified today. ?No open lesions or other pre-ulcerative lesions are identified. ?No other areas of tenderness bilateral lower extremities. No overlying edema, erythema, increased warmth. ?No pain with calf compression, swelling, warmth, erythema. ? ?Assessment: ?Patient presents with symptomatic onychomycosis, hyperkeratotic lesion due to hammertoe ? ?Plan: ?-Treatment options including alternatives, risks, complications were discussed ?-Nails sharply debrided ?10 without complication/bleeding.  Monitor with ingrowing of the toenail.  If symptoms continue to worsen may need to have partial nail avulsion. ?-Hyperkeratotic lesion sharply debrided x1 without any complications or bleeding. Offloading  ?-Discussed daily foot inspection. If there are any changes, to call the office immediately.  ?-Follow-up in 3 months or sooner if any problems are to arise. In the meantime, encouraged to call the office with any questions, concerns, changes symptoms. ? ?Celesta Gentile, DPM ? ?

## 2021-11-25 ENCOUNTER — Encounter (HOSPITAL_COMMUNITY): Payer: Self-pay | Admitting: Radiology

## 2021-12-11 ENCOUNTER — Telehealth: Payer: Self-pay | Admitting: Pharmacist

## 2021-12-11 NOTE — Chronic Care Management (AMB) (Signed)
? ? ?  Chronic Care Management ?Pharmacy Assistant  ? ?Name: Tanner Hensley  MRN: [id number] DOB: [birth year] ? ?12/14/2021 APPOINTMENT REMINDER ? ? ?Called [name], No answer, left message of appointment on 12/14/2021 at 2:30 via telephone visit with Jeni Salles, Pharm D. Notified to have all medications, supplements, blood pressure and/or blood sugar logs available during appointment and to return call if need to reschedule. ? ? ?Care Gaps: ?AWV - scheduled for 10/11/2022 ?Last BP - 137/78 on 11/06/2021 ?Last A1C - 6.1 on 08/11/2021 ?Covid vaccine - never done ?Shingrix - never done ?Malb - overdue ?  ?Star Rating Drugs: ?None ? ? ?Any gaps in medications fill history? No ? ?Gennie Alma CMA  ?Clinical Pharmacist Assistant ?[phone] ? ?

## 2021-12-14 ENCOUNTER — Ambulatory Visit (INDEPENDENT_AMBULATORY_CARE_PROVIDER_SITE_OTHER): Payer: Medicare Other | Admitting: Pharmacist

## 2021-12-14 DIAGNOSIS — N138 Other obstructive and reflux uropathy: Secondary | ICD-10-CM

## 2021-12-14 DIAGNOSIS — E785 Hyperlipidemia, unspecified: Secondary | ICD-10-CM

## 2021-12-14 NOTE — Patient Instructions (Signed)
Hi [name] and Waunita Schooner, ? ?It was great to catch up over the telephone! ? ?Please reach out to me if you have any questions or need anything before I reach back out! ? ?Best, ?Maddie ? ?Jeni Salles, PharmD, BCACP ?Clinical Pharmacist ?Therapist, music at [facility] ?[phone] ? ?  ?Visit Information ? ? Goals Addressed   ?None ?  ? ?Patient Care Plan: [place]  ?  ? ?Problem Identified: Problem: Hypertension, Hyperlipidemia, Diabetes, Atrial Fibrillation, Coronary Artery Disease, GERD, Osteoporosis, Osteoarthritis, Overactive Bladder and BPH   ?  ? ?Long-Range Goal: Patient-Specific Goal   ?Start Date: 11/26/2020  ?Expected End Date: 11/26/2021  ?Recent Progress: On track  ?Priority: High  ?Note:   ?Current Barriers:  ?Unable to independently monitor therapeutic efficacy ? ?Pharmacist Clinical Goal(s):  ?Patient will achieve adherence to monitoring guidelines and medication adherence to achieve therapeutic efficacy through collaboration with PharmD and provider.  ? ?Interventions: ?1:1 collaboration with Laurey Morale, MD regarding development and update of comprehensive plan of care as evidenced by provider attestation and co-signature ?Inter-disciplinary care team collaboration (see longitudinal plan of care) ?Comprehensive medication review performed; medication list updated in electronic medical record ? ?Hypertension (BP goal <140/90) ?-Controlled ?-Current treatment: ?Atenolol 25 mg daily - in AM - Appropriate, Effective, Safe, Accessible ?Olmesartan 20 mg daily - in AM - Appropriate, Effective, Safe, Accessible ?-Medications previously tried: n/a ?-Current home readings: 127/70-148/78 (usually < 140s; checking several times a week) ?-Current dietary habits: did not discuss ?-Current exercise habits: limited with recovering from hospitalizations ?-Reports hypotensive/hypertensive symptoms ?-Educated on Exercise goal of 150 minutes per week; ?Importance of home blood pressure  monitoring; ?Proper BP monitoring technique; ?-Counseled to monitor BP at home weekly, document, and provide log at future appointments ?-Counseled on diet and exercise extensively ?Recommended to continue current medication ? ?Hyperlipidemia: (LDL goal < 70) ?-Uncontrolled ?-Current treatment: ?No medications ?-Medications previously tried: Simvastatin (Myalgias), Atorvastatin (myalgias)   ?-Current dietary patterns: eating red meat once weekly and rarely eats out at restaurants; using olive oil and uses air fryer ?-Current exercise habits: limited with recovering from hospitalizations ?-Educated on Cholesterol goals;  ?Importance of limiting foods high in cholesterol; ?Exercise goal of 150 minutes per week; ?-Counseled on diet and exercise extensively ?Recommended discussion with PCP about consideration of cholesterol medications ? ?Atrial Fibrillation (Goal: prevent stroke and major bleeding) ?-Controlled ?-CHADSVASC: 5 ?-Current treatment: ?Rate control: Atenolol 25 mg daily - Appropriate, Effective, Safe, Accessible ?Anticoagulation: Warfarin 5 mg 1.5 tablets Wed/Sat, 1 tablet all other days - Appropriate, Effective, Safe, Accessible ?-Medications previously tried: n/a ?-Home BP and HR readings: refer to below ?-Counseled on bleeding risk associated with warfarin and importance of self-monitoring for signs/symptoms of bleeding; ?avoidance of NSAIDs due to increased bleeding risk with anticoagulants; ?-Recommended to continue current medication ?Discussed bleed risk with taking both aspirin and warfarin and will plan to address with cardiology ? ?Osteoporosis (Goal prevent fractures) ?-Controlled ?-Last DEXA Scan: 6/22 ?            T-Score femoral neck: -2.5 ?            T-Score total hip: n/a ?            T-Score lumbar spine: +1.2 ?            T-Score forearm radius: -1.2 ?            10-year probability of major osteoporotic fracture: n/a ?  10-year probability of hip fracture: n/a ?-Patient is a  candidate for pharmacologic treatment due to T-Score < -2.5 in femoral neck and T-Score < -2.5 in total hip  ?-Current treatment  ?Calcium Citrate + Vit D 315-200 mg twice daily - Appropriate, Effective, Safe, Accessible ?-Medications previously tried: Alendronate (on for 10 years - holiday in 2022) ?-Recommend [phone] units of vitamin D daily. Recommend 1200 mg of calcium daily from dietary and supplemental sources. Recommend weight-bearing and muscle strengthening exercises for building and maintaining bone density. ?-Counseled on diet and exercise extensively ?Recommended to continue current medication ? ?Memory loss (Goal: maintain memory function) ?-Controlled ?-Current treatment  ?Donepezil 10 mg at bedtime - Appropriate, Effective, Safe, Accessible ?Memantine 10 mg twice daily - Appropriate, Effective, Safe, Accessible ?-Medications previously tried: none  ?-Recommended to continue current medication ? ? ?BPH/Overactive bladder (Goal: minimize symptoms) ?-Controlled ?-Current treatment  ?Solifenacin 10 mg 1 tablet daily - 1/2 tablet daily - Appropriate, Effective, Query Safe, Accessible ?-Medications previously tried: oxybutynin (cost), solifenacin (cost)   ?-Recommended to continue current medication ?Recommended trial of Gemtesa or Myrbetriq pending samples. ? ?CAD (Goal: prevent heart events/strokes) ?-Controlled ?-Current treatment  ?Aspirin 81 mg 1 tablet daily - Appropriate, Effective, Safe, Accessible ?-Medications previously tried: none  ?-Counseled on risk for bleeding while taking both warfarin and aspirin ? ? ?Health Maintenance ?-Vaccine gaps: shingles, COVID vaccine (once out of 90 day window) ?-Current therapy:  ?APAP 500 mg 2 tablets QHS  ?Vitamin C 500 mg  ?Vitamin B Complex daily  ?Biotin 5000 mcg daily  ?Multivitamin (Senior Multivitamin) daily  ?Zinc 50 mg 1 tablet daily  ?-Educated on Cost vs benefit of each product must be carefully weighed by individual consumer ?-Patient is satisfied  with current therapy and denies issues ?-Recommended to continue current medication ? ?Patient Goals/Self-Care Activities ?Patient will:  ?- take medications as prescribed ?check blood pressure weekly, document, and provide at future appointments ?target a minimum of 150 minutes of moderate intensity exercise weekly ? ?Follow Up Plan: The care management team will reach out to the patient again over the next 7 days.   ? ?  ?  ? ?Patient verbalizes understanding of instructions and care plan provided today and agrees to view in [place]. Active MyChart status confirmed with patient.   ?The pharmacy team will reach out to the patient again over the next 7 days.  ? ?Viona Gilmore, RPH  ?

## 2021-12-14 NOTE — Progress Notes (Signed)
? ?Chronic Care Management ?Pharmacy Note ? ?12/14/2021 ?Name:  Tanner Hensley MRN:  [id number] DOB:  [birth year] ? ?Summary: ?LDL not at goal < 70 ?Pt is still having fatigue with solifenacin ? ?Recommendations/Changes made from today's visit: ?-Recommend trial of Gemtesa or Myrbetriq given lack of anticholinergic side effects ?-Recommended trial of Zetia or consider PCSK9 for LDL lowering given CAD diagnosis ? ?Plan: ?Follow up tolerance assessment if medication is changed 3-4 weeks ? ?Subjective: ?Tanner Hensley is an 81 y.o. year old male who is a primary patient of Laurey Morale, MD.  The CCM team was consulted for assistance with disease management and care coordination needs.   ? ?Engaged with patient by telephone for follow up visit in response to provider referral for pharmacy case management and/or care coordination services.  ? ?Consent to Services:  ?The patient was given information about Chronic Care Management services, agreed to services, and gave verbal consent prior to initiation of services.  Please see initial visit note for detailed documentation.  ? ?Patient Care Team: ?Laurey Morale, MD as PCP - General (Family Medicine) ?Ebbie Ridge, MD as Referring Physician (Cardiology) ?Viona Gilmore, [facility] as Pharmacist (Pharmacist) ? ?Recent office visits: ?11/11/21 [name], RN: Patient presented for anti-coag visit. INR 2.9, goal 2-3. Continued 2.5 mg (5 mg x 0.5) every Mon,Wed and Sat; 5 mg (5 mg x 1) all other days and follow up in 5 weeks. ? ?10/07/21 Rolene Arbour, LPN: Patient presented for AWV. ? ?09/15/21 Alysia Penna, MD: Patient presented for vasovagal syncope. Plan for head CT and referred to neurology for evaluation. Prescribed Vesicare for bladder. ? ?08/11/2021 Alysia Penna MD - Patient was seen for overactive bladder and additional issues. Increased Oxybutynin to 10 mg daily. Discontinued Fosamax, Mucinex and Benicar. No follow up noted ? ?Recent consult visits: ?11/19/21  Leafy Kindle (rheumatology): Patient presented for polymyalgia rheumatica follow up. Unable to access notes. ? ?11/06/21 Celesta Gentile, DPM (podiatry): Patient presented for nail trim. ? ?11/06/21 Andrey Spearman, MD (neurology): Patient presented for loss of consciousness. Recommended BP monitoring at home. Advised against driving. ? ?10/21/21 Leafy Kindle (rheumatology): Patient presented for polymyalgia rheumatica follow up. Unable to access notes. ? ?Hospital visits: ?None in previous 6 months. ? ?Objective: ? ?Lab Results  ?Component Value Date  ? CREATININE 1.06 08/11/2021  ? BUN 18 08/11/2021  ? GFR 66.07 08/11/2021  ? GFRNONAA >60 11/05/2020  ? GFRAA >60 10/16/2016  ? NA 141 08/11/2021  ? K 4.6 08/11/2021  ? CALCIUM 10.3 08/11/2021  ? CO2 30 08/11/2021  ? GLUCOSE 104 (H) 08/11/2021  ? ? ?Lab Results  ?Component Value Date/Time  ? HGBA1C 6.1 08/11/2021 02:06 PM  ? HGBA1C 5.6 06/24/2020 11:04 AM  ? GFR 66.07 08/11/2021 02:06 PM  ? GFR 72.11 06/21/2019 11:57 AM  ? MICROALBUR 1.3 08/09/2014 10:27 AM  ? MICROALBUR 2.4 (H) 07/13/2010 10:58 AM  ?  ?Last diabetic Eye exam: No results found for: HMDIABEYEEXA  ?Last diabetic Foot exam: No results found for: HMDIABFOOTEX  ? ?Lab Results  ?Component Value Date  ? CHOL 198 08/11/2021  ? HDL 55.20 08/11/2021  ? LDLCALC 123 (H) 08/11/2021  ? LDLDIRECT 176.1 07/20/2012  ? TRIG 102.0 08/11/2021  ? CHOLHDL 4 08/11/2021  ? ? ? ?  Latest Ref Rng & Units 08/11/2021  ?  2:06 PM 11/05/2020  ? 12:06 PM 10/28/2020  ?  4:31 AM  ?Hepatic Function  ?Total Protein 6.0 - 8.3 g/dL 6.6  6.3   5.5    ?Albumin 3.5 - 5.2 g/dL 4.4   3.6   3.2    ?AST 0 - 37 U/L 28   31   64    ?ALT 0 - 53 U/L '21   26   24    ' ?Alk Phosphatase 39 - 117 U/L 63   60   48    ?Total Bilirubin 0.2 - 1.2 mg/dL 0.9   1.1   0.8    ?Bilirubin, Direct 0.0 - 0.3 mg/dL 0.1   0.1     ? ? ?Lab Results  ?Component Value Date/Time  ? TSH 1.54 08/11/2021 02:06 PM  ? TSH 2.68 06/24/2020 11:04 AM  ? ? ? ?  Latest Ref Rng & Units  08/11/2021  ?  2:06 PM 11/05/2020  ? 12:06 PM 10/28/2020  ?  4:31 AM  ?CBC  ?WBC 4.0 - 10.5 K/uL 6.8   7.2   3.7    ?Hemoglobin 13.0 - 17.0 g/dL 14.2   11.5   11.8    ?Hematocrit 39.0 - 52.0 % 43.1   34.4   36.6    ?Platelets 150.0 - 400.0 K/uL 146.0   213   91    ? ? ?No results found for: VD25OH ? ?Clinical ASCVD: Yes  ?The ASCVD Risk score (Arnett DK, et al., 2019) failed to calculate for the following reasons: ?  The 2019 ASCVD risk score is only valid for ages 82 to 4   ? ? ?  10/07/2021  ? 10:52 AM 09/15/2021  ? 10:53 AM 08/11/2021  ?  2:10 PM  ?Depression screen PHQ 2/9  ?Decreased Interest 0 0 0  ?Down, Depressed, Hopeless 0 0 0  ?PHQ - 2 Score 0 0 0  ?Altered sleeping 0 0 2  ?Tired, decreased energy 0 2 1  ?Change in appetite 0 0 0  ?Feeling bad or failure about yourself  0 0 0  ?Trouble concentrating 0 0 0  ?Moving slowly or fidgety/restless 0 0 0  ?Suicidal thoughts 0 0 0  ?PHQ-9 Score 0 2 3  ?Difficult doing work/chores Not difficult at all  Not difficult at all  ?  ? ?CHA2DS2/VAS Stroke Risk Points  Current as of just now  ?   5 >= 2 Points: High Risk  ?1 - 1.99 Points: Medium Risk  ?0 Points: Low Risk  ?  Last Change: N/A   ?  ? Details   ? This score determines the patient's risk of having a stroke if the  ?patient has atrial fibrillation.  ?  ?  ? Points Metrics  ?0 Has Congestive Heart Failure:  No   ? Current as of just now  ?1 Has Vascular Disease:  Yes   ? Current as of just now  ?1 Has Hypertension:  Yes   ? Current as of just now  ?2 Age:  81   ? Current as of just now  ?1 Has Diabetes:  Yes   ? Current as of just now  ?0 Had Stroke:  No  Had TIA:  No  Had Thromboembolism:  No   ? Current as of just now  ?0 Male:  No   ? Current as of just now  ? ? ?Social History  ? ?Tobacco Use  ?Smoking Status Never  ?Smokeless Tobacco Never  ? ?BP Readings from Last 3 Encounters:  ?11/06/21 137/78  ?09/15/21 136/80  ?08/11/21 140/80  ? ?Pulse Readings from Last 3 Encounters:  ?  11/06/21 60  ?09/15/21 60   ?08/11/21 (!) 59  ? ?Wt Readings from Last 3 Encounters:  ?11/06/21 163 lb (73.9 kg)  ?10/07/21 160 lb (72.6 kg)  ?09/15/21 165 lb 2 oz (74.9 kg)  ? ?BMI Readings from Last 3 Encounters:  ?11/06/21 27.12 kg/m?  ?10/07/21 26.63 kg/m?  ?09/15/21 27.48 kg/m?  ? ? ?Assessment/Interventions: Review of patient past medical history, allergies, medications, health status, including review of consultants reports, laboratory and other test data, was performed as part of comprehensive evaluation and provision of chronic care management services.  ? ?SDOH:  (Social Determinants of Health) assessments and interventions performed: No ? ? ?CCM Care Plan ? ?Allergies  ?Allergen Reactions  ? Codeine Rash and Other (See Comments)  ?  Dizziness, low blood pressure and heart rate  ? Hydrocodone-Acetaminophen Rash and Other (See Comments)  ?  Dizziness, low blood pressure and heart rate  ? Tramadol Nausea And Vomiting  ? Lipitor [Atorvastatin] Other (See Comments)  ?  Myalgias ?  ? Robaxin [Methocarbamol] Other (See Comments)  ?  Dropped blood pressure too much  ? Zocor [Simvastatin] Other (See Comments)  ?  myalgias  ? ? ?Medications Reviewed Today   ? ? Reviewed by Viona Gilmore, [place] (Pharmacist) on 12/14/21 at 1439  Med List Status: <None>  ? ?Medication Order Taking? Sig Documenting Provider Last Dose Status Informant  ?acetaminophen (TYLENOL) 500 MG tablet [id number]  Take 1,000 mg by mouth every 4 (four) hours as needed for mild pain. [provider]  Active Spouse/Significant Other  ?         ?Med Note Daymon Larsen   Sat Oct 25, 2020  1:44 PM) For covid treatment.  ?ascorbic acid (VITAMIN C) 500 MG tablet [id number]  Take 500 mg by mouth daily. [provider]  Active Spouse/Significant Other  ?aspirin EC 81 MG tablet [id number]  Take 81 mg by mouth daily. [provider]  Active Spouse/Significant Other  ?atenolol (TENORMIN) 25 MG tablet [id number]  Take 25 mg by mouth daily. [provider]  Active Spouse/Significant Other  ?b complex vitamins tablet [id number]  Take 1 tablet by mouth daily. [provider]  Active Spouse/Significant Other  ?benzonatate (TESSALON) 200 MG capsule [id number]

## 2021-12-16 ENCOUNTER — Ambulatory Visit: Payer: Medicare Other

## 2021-12-16 ENCOUNTER — Telehealth: Payer: Self-pay | Admitting: Family Medicine

## 2021-12-16 MED ORDER — EZETIMIBE 10 MG PO TABS: 10.0000 mg | ORAL_TABLET | Freq: Every day | ORAL | 3 refills | Status: DC

## 2021-12-16 NOTE — Telephone Encounter (Signed)
Done

## 2021-12-17 NOTE — Telephone Encounter (Signed)
Patient's wife called back. She is aware to have the patient start taking the ezetimibe when it comes in from the mail order pharmacy. Plan to follow up on samples of the Myrbetriq in 2 weeks. ?

## 2021-12-17 NOTE — Telephone Encounter (Signed)
Called patient's wife to make her aware of the new medication sent to the pharmacy. Left a voicemail and requested a call back. ?

## 2021-12-21 ENCOUNTER — Ambulatory Visit (INDEPENDENT_AMBULATORY_CARE_PROVIDER_SITE_OTHER): Payer: Medicare Other

## 2021-12-21 DIAGNOSIS — Z7901 Long term (current) use of anticoagulants: Secondary | ICD-10-CM

## 2021-12-21 LAB — POCT INR: INR: 2.5 (ref 2.0–3.0)

## 2021-12-21 NOTE — Progress Notes (Signed)
Continue to take 1 tablet daily except take 1/2 tablet on Mon, Wed, and Saturdays.  Re-check in 7 weeks per pt request due to holiday the week before. ?

## 2021-12-21 NOTE — Patient Instructions (Addendum)
Pre visit review using our clinic review tool, if applicable. No additional management support is needed unless otherwise documented below in the visit note. ? ?Continue to take 1 tablet daily except take 1/2 tablet on Mon, Wed, and Saturdays.  Re-check in 7 weeks  ?

## 2022-01-03 DIAGNOSIS — E785 Hyperlipidemia, unspecified: Secondary | ICD-10-CM

## 2022-01-03 DIAGNOSIS — N401 Enlarged prostate with lower urinary tract symptoms: Secondary | ICD-10-CM

## 2022-01-03 DIAGNOSIS — N138 Other obstructive and reflux uropathy: Secondary | ICD-10-CM

## 2022-01-04 IMAGING — CT CT ABD-PELV W/ CM
2 of 5 series · 16 of 46 positions shown, 18 images · IV contrast (omnipaque)
Comparison: CT 10/25/2020

CLINICAL DATA: Abdominal trauma. Abdominal bruising with no known
injury.

EXAM:
CT ABDOMEN AND PELVIS WITH CONTRAST
TECHNIQUE: Multidetector CT imaging of the abdomen and pelvis was performed
using the standard protocol following bolus administration of
intravenous contrast.
CONTRAST:  100mL OMNIPAQUE IOHEXOL 300 MG/ML  SOLN

[Series 2: axial st · axial · 0.81mm/px · z∈[-568,-182]mm · 13 of 91 slices shown, 15 images]
[im 7/91  soft-tissue]
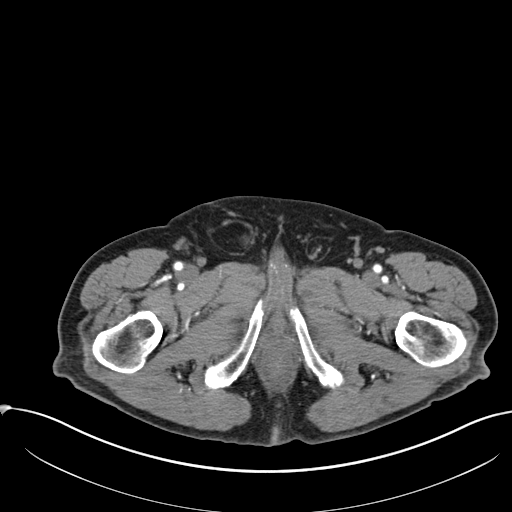
[im 7/91  bone]
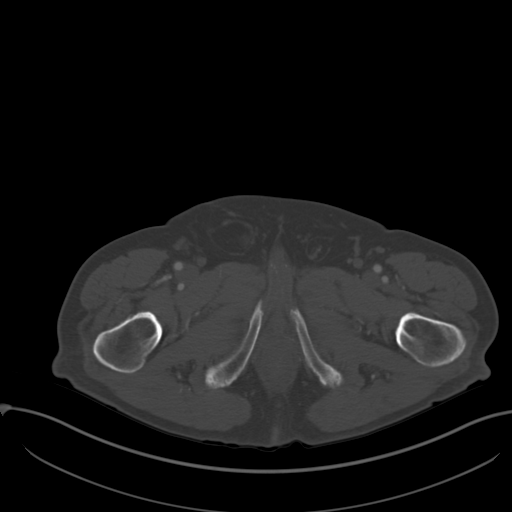
[im 13/91  soft-tissue]
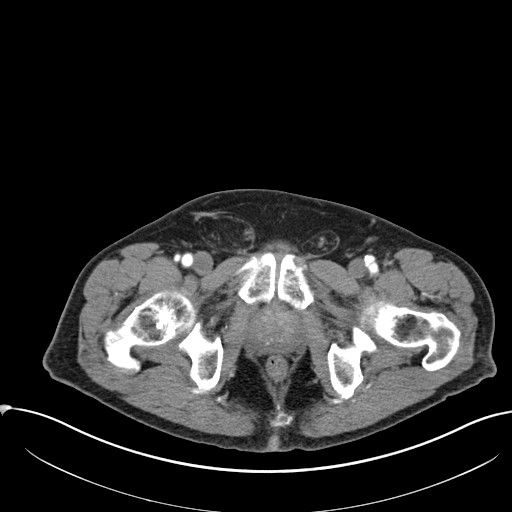
[im 20/91  soft-tissue]
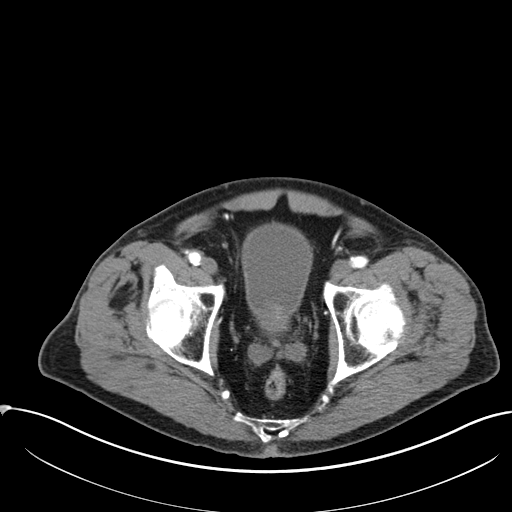
[im 26/91  soft-tissue]
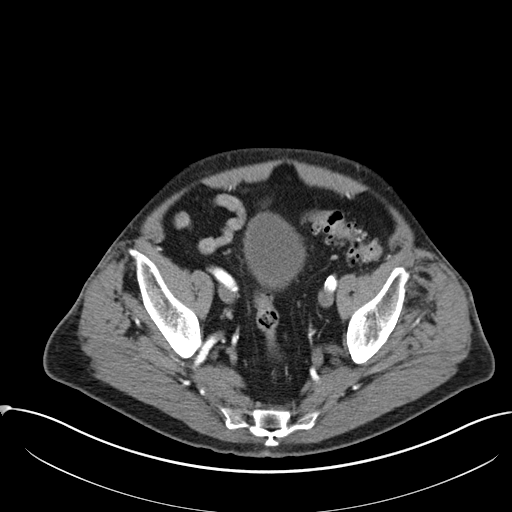
[im 33/91  soft-tissue]
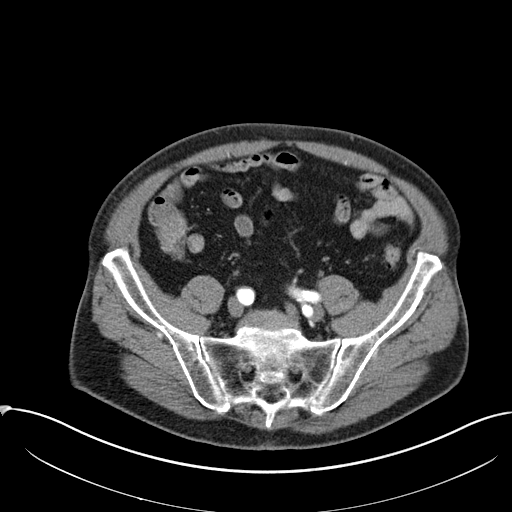
[im 39/91  soft-tissue]
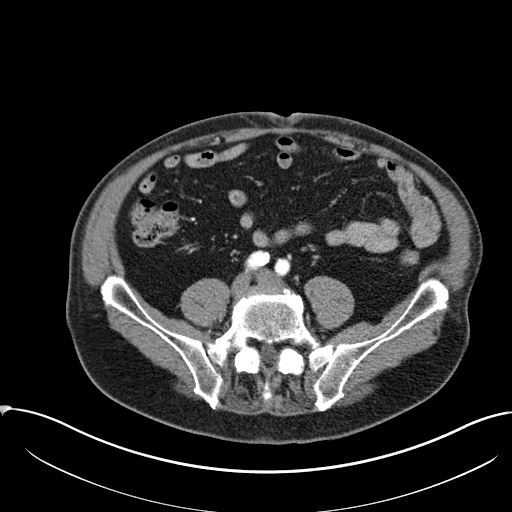
[im 46/91  soft-tissue]
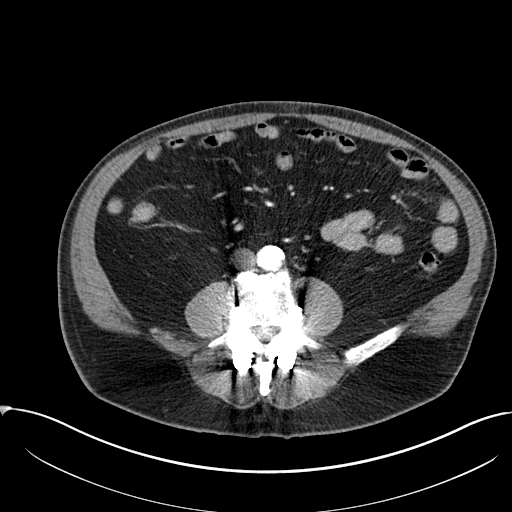
[im 52/91  soft-tissue]
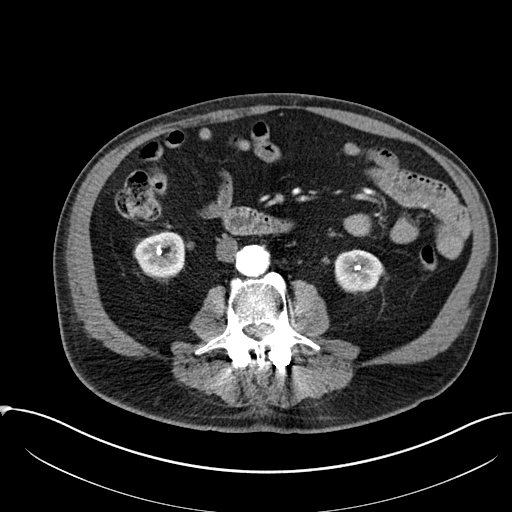
[im 58/91  soft-tissue]
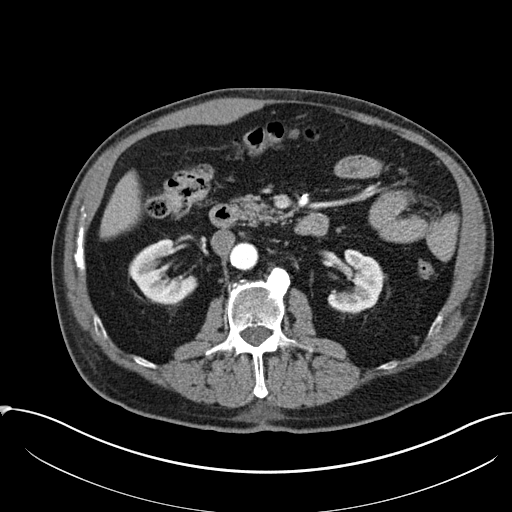
[im 58/91  bone]
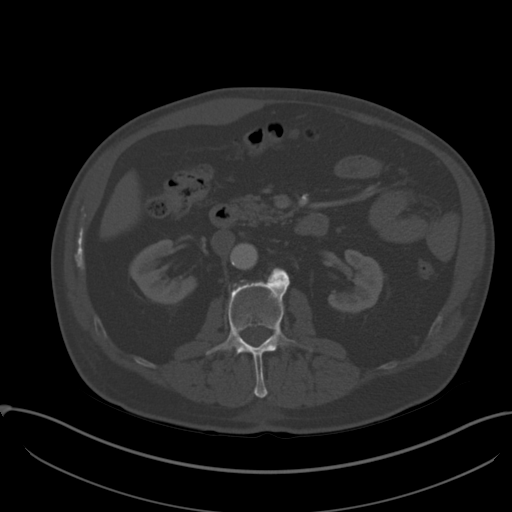
[im 65/91  soft-tissue]
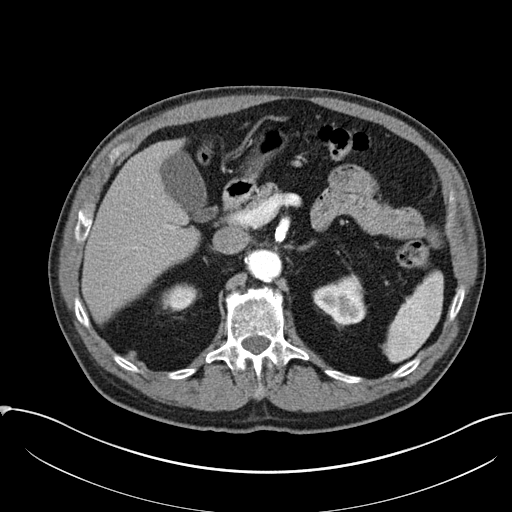
[im 71/91  soft-tissue]
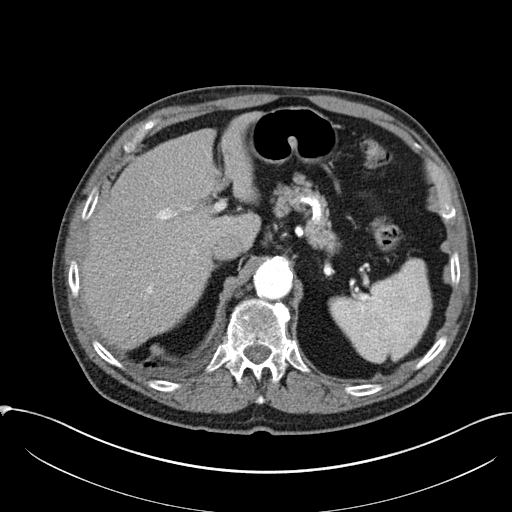
[im 78/91  soft-tissue]
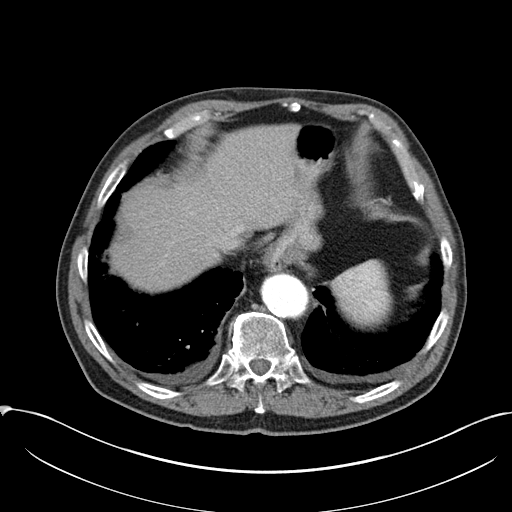
[im 84/91  soft-tissue]
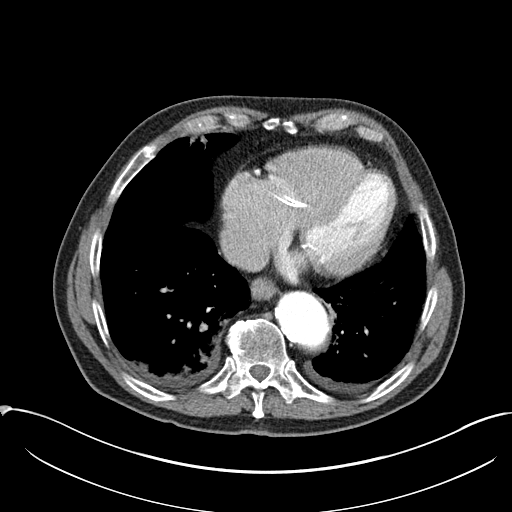

[Series 4: coronal st · coronal · 0.80mm/px · 3 of 151 slices shown]
[im 51/151  soft-tissue]
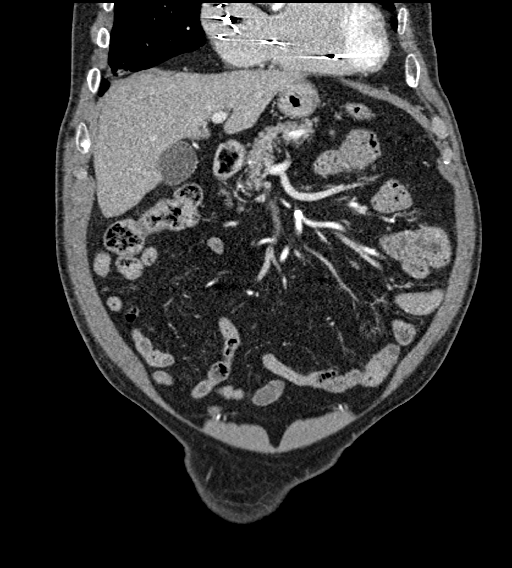
[im 67/151  soft-tissue]
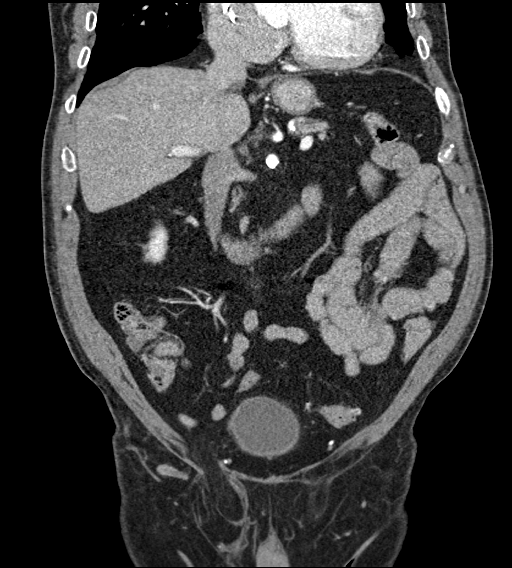
[im 84/151  soft-tissue]
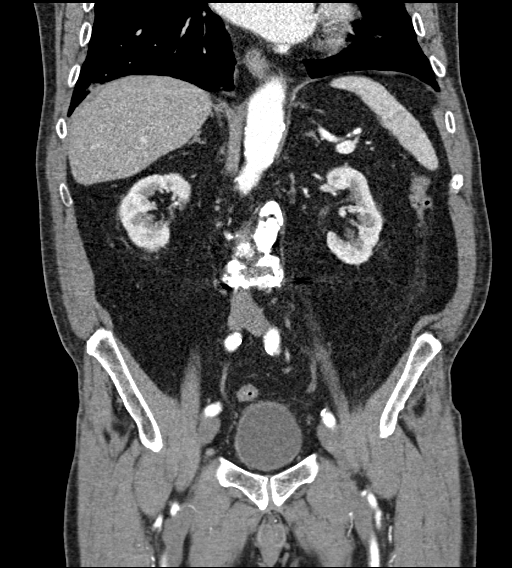

[16 of 46 positions shown; findings below may reference images not displayed]

FINDINGS: Lower chest: Bilateral small pleural effusions increased from prior

New nodular airspace densities in LEFT and RIGHT lower lobe. Greater
density in the RIGHT lower lobe. Individual nodules measure 5 to 10
mm.

Hepatobiliary: No focal hepatic lesion. No biliary duct dilatation.
Common bile duct is normal.

Pancreas: Pancreas is normal. No ductal dilatation. No pancreatic
inflammation.

Spleen: Normal spleen

Adrenals/urinary tract: Adrenal glands normal. Nonobstructing
calculus in LEFT and RIGHT kidney. No ureterolithiasis or
obstructive uropathy. No bladder calculi

Stomach/Bowel: Small hiatal hernia. Stomach, duodenum and
small-bowel normal. Appendix normal. Colon and rectosigmoid colon
normal. There are diverticula sigmoid colon without acute
inflammation.

Vascular/Lymphatic: Abdominal aorta is normal caliber with
atherosclerotic calcification. There is no retroperitoneal or
periportal lymphadenopathy. No pelvic lymphadenopathy.

Reproductive: Prostate enlarged.

Other: No free fluid.

Musculoskeletal: Posterior lumbar fusion.

There is a rounded lesion within the RIGHT rectus muscle which is
new from 10/25/2020. Lesion measures 3.0 x 2.3 by a 4.0 cm. Lesion
seen on axial image 32/2. Lesion relatively small volume.)
IMPRESSION: 1. Fusiform focal enlargement of the RIGHT rectus abdominus muscle
consistent with spontaneous hemorrhage. Small volume rectus sheath
hematoma.
2. New nodular airspace disease in lung bases. Differential includes
aspiration pneumonitis, bibasilar pneumonia, or viral pneumonia
(COVID pneumonia).

## 2022-01-06 ENCOUNTER — Other Ambulatory Visit: Payer: Self-pay

## 2022-01-06 ENCOUNTER — Encounter: Payer: Self-pay | Admitting: Family Medicine

## 2022-01-06 MED ORDER — MIRABEGRON ER 50 MG PO TB24: 50.0000 mg | ORAL_TABLET | Freq: Every day | ORAL | 0 refills | Status: DC

## 2022-01-06 NOTE — Telephone Encounter (Signed)
Called patient's wife back as we got samples in of Myrbetriq 50 mg. Set aside 1 months worth of supply for pick up. Patient's wife verbalized her understanding that this is to replace the solifenacin. Will check back in with patient in 3-4 weeks to see how he tolerates. ?

## 2022-01-07 ENCOUNTER — Other Ambulatory Visit: Payer: Self-pay

## 2022-01-07 DIAGNOSIS — R413 Other amnesia: Secondary | ICD-10-CM

## 2022-01-07 MED ORDER — DONEPEZIL HCL 10 MG PO TABS: 10.0000 mg | ORAL_TABLET | Freq: Every day | ORAL | 1 refills | Status: DC

## 2022-01-07 MED ORDER — DONEPEZIL HCL 10 MG PO TABS: 10.0000 mg | ORAL_TABLET | Freq: Every day | ORAL | 0 refills | Status: DC

## 2022-01-08 ENCOUNTER — Ambulatory Visit: Payer: Medicare Other | Admitting: Podiatry

## 2022-01-08 ENCOUNTER — Other Ambulatory Visit: Payer: Self-pay

## 2022-01-08 DIAGNOSIS — R413 Other amnesia: Secondary | ICD-10-CM

## 2022-01-08 DIAGNOSIS — L84 Corns and callosities: Secondary | ICD-10-CM | POA: Diagnosis not present

## 2022-01-08 DIAGNOSIS — Z7901 Long term (current) use of anticoagulants: Secondary | ICD-10-CM

## 2022-01-08 DIAGNOSIS — B351 Tinea unguium: Secondary | ICD-10-CM | POA: Diagnosis not present

## 2022-01-08 DIAGNOSIS — M79675 Pain in left toe(s): Secondary | ICD-10-CM | POA: Diagnosis not present

## 2022-01-08 DIAGNOSIS — M79674 Pain in right toe(s): Secondary | ICD-10-CM

## 2022-01-08 MED ORDER — DONEPEZIL HCL 10 MG PO TABS: 10.0000 mg | ORAL_TABLET | Freq: Every day | ORAL | 0 refills | Status: DC

## 2022-01-12 NOTE — Progress Notes (Signed)
Subjective: ?81 y.o. returns the office today for painful, elongated, thickened toenails which he cannot trim himself as well as for a callus on the tip of the second toe.  Denies any open sores.  No swelling redness or any drainage.  He has no new concerns today.   ?Currently on warfarin ? ?PCP: Laurey Morale, MD ?Last seen September 15, 2021 ?Last A1c was 6.1 on August 11, 2021 ? ?Objective: ?AAO ?3, NAD ?DP/PT pulses palpable, CRT less than 3 seconds ?Nails hypertrophic, dystrophic, elongated, brittle, discolored ?10. There is tenderness overlying the nails 1-5 bilaterally. There is no surrounding erythema or drainage along the nail sites.  He is getting ingrown toenail left big toe without any signs of infection today. ?Hyperkeratotic lesion the distal aspect the left second toe from a hammertoe deformity.  There is no underlying ulceration identified today. ?No open lesions or other pre-ulcerative lesions are identified. ?No other areas of tenderness bilateral lower extremities. No overlying edema, erythema, increased warmth. ?No pain with calf compression, swelling, warmth, erythema. ? ?Assessment: ?Patient presents with symptomatic onychomycosis, hyperkeratotic lesion due to hammertoe ? ?Plan: ?-Treatment options including alternatives, risks, complications were discussed ?-Nails sharply debrided ?10 without complication/bleeding.  Monitor with ingrowing of the toenail.  If symptoms continue to worsen may need to have partial nail avulsion. ?-Monitor the ingrown toenail for any signs or symptoms of infection. ?-Hyperkeratotic lesion sharply debrided x1 without any complications or bleeding. Offloading  ?-Discussed daily foot inspection. If there are any changes, to call the office immediately.  ?-Follow-up in 3 months or sooner if any problems are to arise. In the meantime, encouraged to call the office with any questions, concerns, changes symptoms. ? ?Celesta Gentile, DPM ? ?

## 2022-01-21 DIAGNOSIS — D3132 Benign neoplasm of left choroid: Secondary | ICD-10-CM | POA: Diagnosis not present

## 2022-01-28 ENCOUNTER — Other Ambulatory Visit: Payer: Self-pay | Admitting: *Deleted

## 2022-01-28 MED ORDER — MIRABEGRON ER 50 MG PO TB24: 50.0000 mg | ORAL_TABLET | Freq: Every day | ORAL | 0 refills | Status: DC

## 2022-02-03 NOTE — Telephone Encounter (Signed)
Patient is tolerating Myrbetriq and reports this is working the best with the least amount of side effects compared with previous OAB medications. Patient's wife dropped off paperwork for [employer] as the company informed her that they will qualify. Filled out and faxed a completed copy of patient assistance form for [facility] as patient will not qualify for Myrbetriq assistance. Removed Vesicare from medication list.  Plan to follow up on the status of PAP in 2 weeks.

## 2022-02-03 NOTE — Addendum Note (Signed)
Addended by: Viona Gilmore on: 02/03/2022 05:27 PM   Modules accepted: Orders

## 2022-02-05 ENCOUNTER — Telehealth: Payer: Self-pay

## 2022-02-05 NOTE — Telephone Encounter (Signed)
Pt has coumadin clinic apt on 6/5 but no nurse will be available. Need pt to RS to 6/7 or 6/12.  LVM. Pt's wife LVM trying to RS. Tried to contact pt's wife but no answer, LVM

## 2022-02-05 NOTE — Telephone Encounter (Signed)
LVM

## 2022-02-08 ENCOUNTER — Ambulatory Visit: Payer: Medicare Other

## 2022-02-09 ENCOUNTER — Encounter: Payer: Self-pay | Admitting: Family Medicine

## 2022-02-09 ENCOUNTER — Ambulatory Visit (INDEPENDENT_AMBULATORY_CARE_PROVIDER_SITE_OTHER): Payer: Medicare Other | Admitting: Family Medicine

## 2022-02-09 VITALS — BP 118/60 | HR 71 | Temp 98.7°F | Wt 162.1 lb

## 2022-02-09 DIAGNOSIS — G4710 Hypersomnia, unspecified: Secondary | ICD-10-CM | POA: Diagnosis not present

## 2022-02-09 DIAGNOSIS — N318 Other neuromuscular dysfunction of bladder: Secondary | ICD-10-CM | POA: Diagnosis not present

## 2022-02-09 DIAGNOSIS — E1169 Type 2 diabetes mellitus with other specified complication: Secondary | ICD-10-CM | POA: Diagnosis not present

## 2022-02-09 LAB — CBC WITH DIFFERENTIAL/PLATELET
Basophils Absolute: 0 10*3/uL (ref 0.0–0.1)
Basophils Relative: 0.5 % (ref 0.0–3.0)
Eosinophils Absolute: 0 10*3/uL (ref 0.0–0.7)
Eosinophils Relative: 0.6 % (ref 0.0–5.0)
HCT: 42.5 % (ref 39.0–52.0)
Hemoglobin: 14.4 g/dL (ref 13.0–17.0)
Lymphocytes Relative: 7.4 % — ABNORMAL LOW (ref 12.0–46.0)
Lymphs Abs: 0.6 10*3/uL — ABNORMAL LOW (ref 0.7–4.0)
MCHC: 34 g/dL (ref 30.0–36.0)
MCV: 107 fl — ABNORMAL HIGH (ref 78.0–100.0)
Monocytes Absolute: 0.6 10*3/uL (ref 0.1–1.0)
Monocytes Relative: 7 % (ref 3.0–12.0)
Neutro Abs: 7.1 10*3/uL (ref 1.4–7.7)
Neutrophils Relative %: 84.5 % — ABNORMAL HIGH (ref 43.0–77.0)
Platelets: 148 10*3/uL — ABNORMAL LOW (ref 150.0–400.0)
RBC: 3.97 Mil/uL — ABNORMAL LOW (ref 4.22–5.81)
RDW: 12.6 % (ref 11.5–15.5)
WBC: 8.4 10*3/uL (ref 4.0–10.5)

## 2022-02-09 LAB — BASIC METABOLIC PANEL
BUN: 20 mg/dL (ref 6–23)
CO2: 30 mEq/L (ref 19–32)
Calcium: 10.3 mg/dL (ref 8.4–10.5)
Chloride: 103 mEq/L (ref 96–112)
Creatinine, Ser: 1.17 mg/dL (ref 0.40–1.50)
GFR: 58.48 mL/min — ABNORMAL LOW (ref >60.00)
Glucose, Bld: 167 mg/dL — ABNORMAL HIGH (ref 70–99)
Potassium: 4.5 mEq/L (ref 3.5–5.1)
Sodium: 140 mEq/L (ref 135–145)

## 2022-02-09 LAB — HEMOGLOBIN A1C: Hgb A1c MFr Bld: 6.3 % (ref 4.6–6.5)

## 2022-02-09 NOTE — Progress Notes (Signed)
   Subjective:    Patient ID: Tanner Hensley, male    DOB: 05-21-1941, 81 y.o.   MRN: [id number]  HPI Here for 2 weeks of extreme fatigue and sleepiness. He sleeps well at night, but then he feels like sleeping throughout the day. He has also had some slight SOB. 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 He was given samples of Myrbetriq to try. He says these have worked quite well for the OAB symptoms.    Review of Systems  Constitutional:  Positive for fatigue.  Respiratory:  Positive for shortness of breath. Negative for cough and wheezing.   Cardiovascular: Negative.   Gastrointestinal: Negative.   Genitourinary:  Positive for frequency. Negative for flank pain, hematuria and urgency.      Objective:   Physical Exam Constitutional:      Appearance: Normal appearance. He is not ill-appearing.  Cardiovascular:     Rate and Rhythm: Normal rate and regular rhythm.     Pulses: Normal pulses.     Heart sounds: Normal heart sounds.  Pulmonary:     Effort: Pulmonary effort is normal.     Breath sounds: Normal breath sounds.  Neurological:     Mental Status: He is alert and oriented to person, place, and time. Mental status is at baseline.          Assessment & Plan:  He has been experiencing fatigue and sleepiness, and I suspect these are side effects of the Myrbetriq. We agreed that he will stop this and we will see how he does over the next few weeks. We will also screen with some labs today to rule out anemia, etc. If the sleepiness goes away, then we may try the Myrbetriq at a lower dose (25 mg).  Alysia Penna, MD

## 2022-02-10 ENCOUNTER — Ambulatory Visit (INDEPENDENT_AMBULATORY_CARE_PROVIDER_SITE_OTHER): Payer: Medicare Other

## 2022-02-10 DIAGNOSIS — Z7901 Long term (current) use of anticoagulants: Secondary | ICD-10-CM

## 2022-02-10 DIAGNOSIS — Z45018 Encounter for adjustment and management of other part of cardiac pacemaker: Secondary | ICD-10-CM | POA: Diagnosis not present

## 2022-02-10 DIAGNOSIS — I499 Cardiac arrhythmia, unspecified: Secondary | ICD-10-CM | POA: Diagnosis not present

## 2022-02-10 LAB — POCT INR: INR: 2.8 (ref 2.0–3.0)

## 2022-02-10 NOTE — Patient Instructions (Addendum)
Pre visit review using our clinic review tool, if applicable. No additional management support is needed unless otherwise documented below in the visit note.  Continue to take 1 tablet daily except take 1/2 tablet on Mon, Wed, and Saturdays.  Re-check in 6 weeks. 

## 2022-02-10 NOTE — Progress Notes (Signed)
Continue to take 1 tablet daily except take 1/2 tablet on Mon, Wed, and Saturdays.  Re-check in 6 weeks. 

## 2022-02-12 ENCOUNTER — Telehealth: Payer: Self-pay | Admitting: Pharmacist

## 2022-02-12 NOTE — Chronic Care Management (AMB) (Unsigned)
    Chronic Care Management Pharmacy Assistant   Name: SAMANYU TINNELL  MRN: [id number] DOB: May 04, 1941  Reason for Encounter: Follow up patient assistance for [employer].  [name] is needing patients proof of income, 1099 and SS year 2022 for both spouse and patient. They only have on file 2022 SS for patients spouse.     [place] Pharmacist Assistant [phone]

## 2022-02-23 ENCOUNTER — Encounter: Payer: Self-pay | Admitting: Family Medicine

## 2022-02-24 NOTE — Telephone Encounter (Signed)
There is a hand written RX for Gentessa 25 mg ready on your desk

## 2022-02-26 ENCOUNTER — Telehealth: Payer: Self-pay

## 2022-03-03 NOTE — Chronic Care Management (AMB) (Signed)
Called [employer] to follow up with patients [employer] patient assistance. Spoke with [name], the patient has been approved through 09/05/2022.  They have tried to contact the patient to confirm delivery address and were unsuccessful, [name] will try to contact them again today.  I left a message for Tanner Hensley and [name] (wife) that [name] will be calling them today and if they are unable to answer that call to be sure and call them back with the instructions they leave, as [facility] will not ship the medication without patients confirmation of shipping address.

## 2022-03-15 ENCOUNTER — Ambulatory Visit: Payer: Medicare Other | Admitting: Podiatry

## 2022-03-15 DIAGNOSIS — L84 Corns and callosities: Secondary | ICD-10-CM

## 2022-03-15 DIAGNOSIS — M79675 Pain in left toe(s): Secondary | ICD-10-CM

## 2022-03-15 DIAGNOSIS — M79674 Pain in right toe(s): Secondary | ICD-10-CM | POA: Diagnosis not present

## 2022-03-15 DIAGNOSIS — Z7901 Long term (current) use of anticoagulants: Secondary | ICD-10-CM

## 2022-03-15 DIAGNOSIS — B351 Tinea unguium: Secondary | ICD-10-CM | POA: Diagnosis not present

## 2022-03-16 NOTE — Progress Notes (Signed)
Subjective: 81 y.o. returns the office today for painful, elongated, thickened toenails which he cannot trim himself as well as for a callus on the tip of the second toe.  Denies any open sores.  No swelling redness or any drainage.  He has no new concerns today.    Currently on warfarin  PCP: Laurey Morale, MD Last seen February 09, 2022 Last A1c was 6.3 on February 09, 2022  Objective: AAO 3, NAD DP/PT pulses palpable, CRT less than 3 seconds Nails hypertrophic, dystrophic, elongated, brittle, discolored 10. There is tenderness overlying the nails 1-5 bilaterally. There is no surrounding erythema or drainage along the nail sites.  Incurvation of nail border. Hyperkeratotic lesion the distal aspect the left second toe from a hammertoe deformity.  There is no underlying ulceration identified today. No open lesions or other pre-ulcerative lesions are identified. No other areas of tenderness bilateral lower extremities. No overlying edema, erythema, increased warmth. No pain with calf compression, swelling, warmth, erythema.  Assessment: Patient presents with symptomatic onychomycosis, hyperkeratotic lesion due to hammertoe  Plan: -Treatment options including alternatives, risks, complications were discussed -Nails sharply debrided 10 without complication/bleeding.  Monitor with ingrowing of the toenail.  If symptoms continue to worsen may need to have partial nail avulsion. -Hyperkeratotic lesion sharply debrided x1 without any complications or bleeding. Offloading  -Discussed daily foot inspection. If there are any changes, to call the office immediately.  -Follow-up in 3 months or sooner if any problems are to arise. In the meantime, encouraged to call the office with any questions, concerns, changes symptoms.  Celesta Gentile, DPM

## 2022-03-17 ENCOUNTER — Telehealth: Payer: Self-pay | Admitting: Pharmacist

## 2022-03-17 NOTE — Chronic Care Management (AMB) (Signed)
Chronic Care Management Pharmacy Assistant   Name: Tanner Hensley  MRN: [id number] DOB: 1941-06-22  Reason for Encounter: Disease State / Hypertension Assessment Call   Conditions to be addressed/monitored: HTN  Recent office visits:  02/09/2022 Alysia Penna MD - Patient was seen for Excessive sleepiness and additional issues. No medication changes. No follow up noted.   Recent consult visits:  03/15/2022 Celesta Gentile DPM (podiatry) - Patient was seen for Dermatophytosis of nail and additional issues. No medication changes. Follow up in 2 months.   01/08/2022  Celesta Gentile DPM (podiatry) - Patient was seen for Dermatophytosis of nail and additional issues. No medication changes. Follow up in 2 months.   Hospital visits:  None  Medications: Outpatient Encounter Medications as of 03/17/2022  Medication Sig Note   acetaminophen (TYLENOL) 500 MG tablet Take 1,000 mg by mouth every 4 (four) hours as needed for mild pain. 10/25/2020: For covid treatment.   ascorbic acid (VITAMIN C) 500 MG tablet Take 500 mg by mouth daily.    aspirin EC 81 MG tablet Take 81 mg by mouth daily.    atenolol (TENORMIN) 25 MG tablet Take 25 mg by mouth daily.    b complex vitamins tablet Take 1 tablet by mouth daily.    benzonatate (TESSALON) 200 MG capsule Take 1 capsule (200 mg total) by mouth every 6 (six) hours as needed for cough.    Biotin 5000 MCG CAPS Take 5,000 mcg by mouth daily.     CALCIUM CITRATE-VITAMIN D PO Take 1 tablet by mouth 2 (two) times daily. [phone]    Cholecalciferol (VITAMIN D3 PO) Take by mouth daily at 2 PM.    donepezil (ARICEPT) 10 MG tablet Take 1 tablet (10 mg total) by mouth at bedtime.    ezetimibe (ZETIA) 10 MG tablet Take 1 tablet (10 mg total) by mouth daily.    memantine (NAMENDA) 10 MG tablet TAKE 1 TABLET BY MOUTH  TWICE DAILY    mirabegron ER (MYRBETRIQ) 50 MG TB24 tablet Take 1 tablet (50 mg total) by mouth daily.    Multiple Vitamin (MULTIVITAMIN WITH  MINERALS) TABS tablet Take 1 tablet by mouth daily. SENIOR MULTIVITAMIN    warfarin (COUMADIN) 5 MG tablet TAKE 1 TABLET BY MOUTH  DAILY EXCEPT 1.5 TABLETS ON WEDNESDAY AND SATURDAY OR  AS DIRECTED BY  ANTICOAGULATION CLINIC    No facility-administered encounter medications on file as of 03/17/2022.  Fill History: ezetimibe (ZETIA) tablet 10 mg 12/16/2021 90   DONEPEZIL HCL  10 MG TABS 04/30/2021 90   MEMANTINE HYDROCHLORIDE  10 MG TABS 10/27/2021 90   WARFARIN SODIUM  5 MG TABS 11/04/2021 90   Recent Relevant Labs: Lab Results  Component Value Date/Time   HGBA1C 6.3 02/09/2022 10:47 AM   HGBA1C 6.1 08/11/2021 02:06 PM   MICROALBUR 1.3 08/09/2014 10:27 AM   MICROALBUR 2.4 (H) 07/13/2010 10:58 AM    Kidney Function Lab Results  Component Value Date/Time   CREATININE 1.17 02/09/2022 10:47 AM   CREATININE 1.06 08/11/2021 02:06 PM   CREATININE 1.20 (H) 06/24/2020 11:04 AM   GFR 58.48 (L) 02/09/2022 10:47 AM   GFRNONAA >60 11/05/2020 12:06 PM   GFRAA >60 10/16/2016 05:22 AM  Reviewed chart prior to disease state call. Spoke with patient regarding BP  Recent Office Vitals: BP Readings from Last 3 Encounters:  02/09/22 118/60  11/06/21 137/78  09/15/21 136/80   Pulse Readings from Last 3 Encounters:  02/09/22 71  11/06/21 60  09/15/21 60    Wt Readings from Last 3 Encounters:  02/09/22 162 lb 2 oz (73.5 kg)  11/06/21 163 lb (73.9 kg)  10/07/21 160 lb (72.6 kg)     Kidney Function Lab Results  Component Value Date/Time   CREATININE 1.17 02/09/2022 10:47 AM   CREATININE 1.06 08/11/2021 02:06 PM   CREATININE 1.20 (H) 06/24/2020 11:04 AM   GFR 58.48 (L) 02/09/2022 10:47 AM   GFRNONAA >60 11/05/2020 12:06 PM   GFRAA >60 10/16/2016 05:22 AM       Latest Ref Rng & Units 02/09/2022   10:47 AM 08/11/2021    2:06 PM 11/05/2020   12:06 PM  BMP  Glucose 70 - 99 mg/dL 167  104  129   BUN 6 - 23 mg/dL '20  18  13   '$ Creatinine 0.40 - 1.50 mg/dL 1.17  1.06  1.17   Sodium 135 -  145 mEq/L 140  141  141   Potassium 3.5 - 5.1 mEq/L 4.5  4.6  4.2   Chloride 96 - 112 mEq/L 103  104  106   CO2 19 - 32 mEq/L '30  30  27   '$ Calcium 8.4 - 10.5 mg/dL 10.3  10.3  9.4     Current antihypertensive regimen:  Atenolol 25 mg daily  How often are you checking your Blood Pressure? Patient is checking blood pressures 2-3 times per week  Current home BP readings: Patients wife states his readings are between 110/60 and 130/80. He did recently injured his back and his blood pressure went up to around 200/100 twice, now that his back is better, his readings are back to normal.   What recent interventions/DTPs have been made by any provider to improve Blood Pressure control since last CPP Visit: No recent interventions  Any recent hospitalizations or ED visits since last visit with CPP? No recent hospital visits.   What diet changes have been made to improve Blood Pressure Control?  Patient follows a low sugar and low sodium diet Breakfast - patient will have oatmeal or peanut butter toast Lunch/Dinner - patient will have a meal with a meat, vegetable, starch and fruit.   What exercise is being done to improve your Blood Pressure Control?  Patient will golf 3 days and does gardening  Adherence Review: Is the patient currently on ACE/ARB medication? No Does the patient have >5 day gap between last estimated fill dates? No   Care Gaps: AWV - scheduled for 10/11/2022 Last BP - 118/60 on 02/09/2022 Last A1C - 6.3 on 02/09/2022 Covid vaccine - never done Foot exam - never done Malb - overdue Tdap - overdue Eye exam - overdue Shingrix - overdue  Star Rating Drugs: None  [place] Pharmacist Assistant [phone]

## 2022-03-22 ENCOUNTER — Ambulatory Visit (INDEPENDENT_AMBULATORY_CARE_PROVIDER_SITE_OTHER): Payer: Medicare Other

## 2022-03-22 DIAGNOSIS — Z7901 Long term (current) use of anticoagulants: Secondary | ICD-10-CM

## 2022-03-22 LAB — POCT INR: INR: 2.8 (ref 2.0–3.0)

## 2022-03-22 NOTE — Patient Instructions (Addendum)
Pre visit review using our clinic review tool, if applicable. No additional management support is needed unless otherwise documented below in the visit note.  Continue to take 1 tablet daily except take 1/2 tablet on Mon, Wed, and Saturdays.  Re-check in 6 weeks.

## 2022-03-22 NOTE — Progress Notes (Signed)
Continue to take 1 tablet daily except take 1/2 tablet on Mon, Wed, and Saturdays.  Re-check in 6 weeks.

## 2022-03-24 DIAGNOSIS — Z955 Presence of coronary angioplasty implant and graft: Secondary | ICD-10-CM | POA: Diagnosis not present

## 2022-03-24 DIAGNOSIS — I712 Thoracic aortic aneurysm, without rupture, unspecified: Secondary | ICD-10-CM | POA: Diagnosis not present

## 2022-03-24 DIAGNOSIS — I4729 Other ventricular tachycardia: Secondary | ICD-10-CM | POA: Diagnosis not present

## 2022-03-24 DIAGNOSIS — Z7901 Long term (current) use of anticoagulants: Secondary | ICD-10-CM | POA: Diagnosis not present

## 2022-03-24 DIAGNOSIS — I4819 Other persistent atrial fibrillation: Secondary | ICD-10-CM | POA: Diagnosis not present

## 2022-03-24 DIAGNOSIS — I4430 Unspecified atrioventricular block: Secondary | ICD-10-CM | POA: Diagnosis not present

## 2022-03-24 DIAGNOSIS — I2510 Atherosclerotic heart disease of native coronary artery without angina pectoris: Secondary | ICD-10-CM | POA: Diagnosis not present

## 2022-03-24 DIAGNOSIS — Z950 Presence of cardiac pacemaker: Secondary | ICD-10-CM | POA: Diagnosis not present

## 2022-03-30 ENCOUNTER — Other Ambulatory Visit: Payer: Self-pay | Admitting: Family Medicine

## 2022-03-30 DIAGNOSIS — Z7901 Long term (current) use of anticoagulants: Secondary | ICD-10-CM

## 2022-03-31 ENCOUNTER — Other Ambulatory Visit: Payer: Self-pay

## 2022-03-31 NOTE — Telephone Encounter (Signed)
Pt compliant with warfarin management and PCP apts. Sent in refill. 

## 2022-04-02 NOTE — Progress Notes (Signed)
Followed up Tanner Hensley, spoke with patients wife and she states they are getting shipments, the just received the second shipment today.

## 2022-04-05 DIAGNOSIS — N200 Calculus of kidney: Secondary | ICD-10-CM | POA: Diagnosis not present

## 2022-04-05 DIAGNOSIS — I517 Cardiomegaly: Secondary | ICD-10-CM | POA: Diagnosis not present

## 2022-04-05 DIAGNOSIS — I712 Thoracic aortic aneurysm, without rupture, unspecified: Secondary | ICD-10-CM | POA: Diagnosis not present

## 2022-04-05 DIAGNOSIS — I700 Atherosclerosis of aorta: Secondary | ICD-10-CM | POA: Diagnosis not present

## 2022-04-20 DIAGNOSIS — M0609 Rheumatoid arthritis without rheumatoid factor, multiple sites: Secondary | ICD-10-CM | POA: Diagnosis not present

## 2022-04-20 DIAGNOSIS — M79641 Pain in right hand: Secondary | ICD-10-CM | POA: Diagnosis not present

## 2022-04-20 DIAGNOSIS — M353 Polymyalgia rheumatica: Secondary | ICD-10-CM | POA: Diagnosis not present

## 2022-04-20 DIAGNOSIS — M1991 Primary osteoarthritis, unspecified site: Secondary | ICD-10-CM | POA: Diagnosis not present

## 2022-04-20 DIAGNOSIS — M79642 Pain in left hand: Secondary | ICD-10-CM | POA: Diagnosis not present

## 2022-04-29 DIAGNOSIS — I7121 Aneurysm of the ascending aorta, without rupture: Secondary | ICD-10-CM | POA: Diagnosis not present

## 2022-04-29 DIAGNOSIS — I7123 Aneurysm of the descending thoracic aorta, without rupture: Secondary | ICD-10-CM | POA: Diagnosis not present

## 2022-05-03 ENCOUNTER — Ambulatory Visit: Payer: Medicare Other

## 2022-05-03 ENCOUNTER — Ambulatory Visit (INDEPENDENT_AMBULATORY_CARE_PROVIDER_SITE_OTHER): Payer: Medicare Other

## 2022-05-03 DIAGNOSIS — Z7901 Long term (current) use of anticoagulants: Secondary | ICD-10-CM | POA: Diagnosis not present

## 2022-05-03 LAB — POCT INR: INR: 1.6 — AB (ref 2.0–3.0)

## 2022-05-03 NOTE — Progress Notes (Signed)
Increase dose today to take 1 tablet and increase dose tomorrow to take 1 1/2 tablets  and then continue to take 1 tablet daily except take 1/2 tablet on Mon, Wed, and Saturdays.  Re-check in 2 weeks.

## 2022-05-03 NOTE — Patient Instructions (Addendum)
Pre visit review using our clinic review tool, if applicable. No additional management support is needed unless otherwise documented below in the visit note.  Increase dose today to take 1 tablet and increase dose tomorrow to take 1 1/2 tablets  and then continue to take 1 tablet daily except take 1/2 tablet on Mon, Wed, and Saturdays.  Re-check in 2 weeks.

## 2022-05-17 ENCOUNTER — Ambulatory Visit (INDEPENDENT_AMBULATORY_CARE_PROVIDER_SITE_OTHER): Payer: Medicare Other

## 2022-05-17 ENCOUNTER — Ambulatory Visit: Payer: Medicare Other | Admitting: Podiatry

## 2022-05-17 DIAGNOSIS — L84 Corns and callosities: Secondary | ICD-10-CM | POA: Diagnosis not present

## 2022-05-17 DIAGNOSIS — Z7901 Long term (current) use of anticoagulants: Secondary | ICD-10-CM

## 2022-05-17 DIAGNOSIS — M79674 Pain in right toe(s): Secondary | ICD-10-CM | POA: Diagnosis not present

## 2022-05-17 DIAGNOSIS — B351 Tinea unguium: Secondary | ICD-10-CM

## 2022-05-17 DIAGNOSIS — M79675 Pain in left toe(s): Secondary | ICD-10-CM

## 2022-05-17 LAB — POCT INR: INR: 2.8 (ref 2.0–3.0)

## 2022-05-17 NOTE — Progress Notes (Signed)
Continue to take 1 tablet daily except take 1/2 tablet on Mon, Wed, and Saturdays.  Re-check in 6 weeks.

## 2022-05-17 NOTE — Progress Notes (Unsigned)
Subjective: 81 y.o. returns the office today for painful, elongated, thickened toenails which he cannot trim himself. He also gets a callus at times to the tip of the toe.  Denies any open sores.  No swelling redness or any drainage.  He has no new concerns today.    Currently on warfarin  PCP: Laurey Morale, MD Last seen February 09, 2022 Last A1c was 6.3 on February 09, 2022  Objective: AAO 3, NAD DP/PT pulses palpable, CRT less than 3 seconds Nails hypertrophic, dystrophic, elongated, brittle, discolored 10. There is tenderness overlying the nails 1-5 bilaterally. There is no surrounding erythema or drainage along the nail sites.  Incurvation of nail border. Hyperkeratotic lesion the distal aspect the left second toe from a hammertoe deformity.  There is no underlying ulceration identified today. No open lesions or other pre-ulcerative lesions are identified. No pain with calf compression, swelling, warmth, erythema.  Assessment: Patient presents with symptomatic onychomycosis, hyperkeratotic lesion due to hammertoe  Plan: -Treatment options including alternatives, risks, complications were discussed -Nails sharply debrided 10 without complication/bleeding.  -Hyperkeratotic lesion sharply debrided x1 without any complications or bleeding. Offloading  -Discussed daily foot inspection. If there are any changes, to call the office immediately.  -Follow-up in 3 months or sooner if any problems are to arise. In the meantime, encouraged to call the office with any questions, concerns, changes symptoms.  Celesta Gentile, DPM

## 2022-05-17 NOTE — Patient Instructions (Addendum)
Pre visit review using our clinic review tool, if applicable. No additional management support is needed unless otherwise documented below in the visit note.  Continue to take 1 tablet daily except take 1/2 tablet on Mon, Wed, and Saturdays.  Re-check in 6 weeks.

## 2022-05-25 ENCOUNTER — Encounter: Payer: Self-pay | Admitting: *Deleted

## 2022-05-25 NOTE — Progress Notes (Signed)
[facility] Quality Team Note  Name: Tanner Hensley Date of Birth: [birth year] MRN: [id number] Date: 05/25/2022  [facility] Quality Team has reviewed this patient's chart, please see recommendations below:  [facility] Quality Other; (Pt has open gap for KED measure.  Would need urine albumin creatinine ratio test completed in order to close gap.  )

## 2022-06-04 DIAGNOSIS — I495 Sick sinus syndrome: Secondary | ICD-10-CM | POA: Diagnosis not present

## 2022-06-04 DIAGNOSIS — I77810 Thoracic aortic ectasia: Secondary | ICD-10-CM | POA: Diagnosis not present

## 2022-06-04 DIAGNOSIS — I4819 Other persistent atrial fibrillation: Secondary | ICD-10-CM | POA: Diagnosis not present

## 2022-06-04 DIAGNOSIS — I350 Nonrheumatic aortic (valve) stenosis: Secondary | ICD-10-CM | POA: Diagnosis not present

## 2022-06-17 ENCOUNTER — Other Ambulatory Visit: Payer: Self-pay | Admitting: Family Medicine

## 2022-06-17 DIAGNOSIS — R413 Other amnesia: Secondary | ICD-10-CM

## 2022-06-22 ENCOUNTER — Telehealth: Payer: Self-pay | Admitting: Pharmacist

## 2022-06-22 NOTE — Progress Notes (Unsigned)
Chronic Care Management Pharmacy Note  06/23/2022 Name:  Tanner Hensley MRN:  [id number] DOB:  1941-04-04  Summary: LDL not at goal < 70 Pt is having minimal fatigue with Gemtesa Pt did not want to start on Zetia  Recommendations/Changes made from today's visit: -Recommend bringing BP cuff to next office visit to ensure accuracy -Recommended trial of Zetia or consider PCSK9 for LDL lowering given CAD diagnosis  Plan: Scheduled CPE HLD and HTN assessment in 3 months Follow up in 6 months  Subjective: Tanner Hensley is an 81 y.o. year old male who is a primary patient of Laurey Morale, MD.  The CCM team was consulted for assistance with disease management and care coordination needs.    Engaged with patient by telephone for follow up visit in response to provider referral for pharmacy case management and/or care coordination services.   Consent to Services:  The patient was given information about Chronic Care Management services, agreed to services, and gave verbal consent prior to initiation of services.  Please see initial visit note for detailed documentation.   Patient Care Team: Laurey Morale, MD as PCP - General (Family Medicine) Ebbie Ridge, MD as Referring Physician (Cardiology) Viona Gilmore, [facility] as Pharmacist (Pharmacist)  Recent office visits: 05/17/22 [name], RN: Patient presented for anti-coag visit. INR 2.5, goal 2-3. Continued 2.5 mg (5 mg x 0.5) every Mon,Wed and Sat; 5 mg (5 mg x 1) all other days and follow up in 6 weeks.  02/09/2022 Alysia Penna MD - Patient was seen for Excessive sleepiness and additional issues. No medication changes. No follow up noted.   Recent consult visits: 05/17/22 Celesta Gentile, DPM (podiatry): Patient presented for nail trim.  04/29/22 Adrian Prows, MD (cardiothoratic surgery): Patient presented for initial consult for AAA. Follow up in 1 year.  04/20/22 Leafy Kindle (rheumatology): Patient presented  for polymyalgia rheumatica follow up. Unable to access notes.  03/24/22 Adrian Prows, MD (cardiothoratic surgery): Patient presented for Afib follow up.   03/15/2022 Celesta Gentile DPM (podiatry) - Patient was seen for Dermatophytosis of nail and additional issues. No medication changes. Follow up in 2 months.    01/08/2022  Celesta Gentile DPM (podiatry) - Patient was seen for Dermatophytosis of nail and additional issues. No medication changes. Follow up in 2 months.  11/06/21 Andrey Spearman, MD (neurology): Patient presented for loss of consciousness. Recommended BP monitoring at home. Advised against driving.  Hospital visits: None in previous 6 months.  Objective:  Lab Results  Component Value Date   CREATININE 1.17 02/09/2022   BUN 20 02/09/2022   GFR 58.48 (L) 02/09/2022   GFRNONAA >60 11/05/2020   GFRAA >60 10/16/2016   NA 140 02/09/2022   K 4.5 02/09/2022   CALCIUM 10.3 02/09/2022   CO2 30 02/09/2022   GLUCOSE 167 (H) 02/09/2022    Lab Results  Component Value Date/Time   HGBA1C 6.3 02/09/2022 10:47 AM   HGBA1C 6.1 08/11/2021 02:06 PM   GFR 58.48 (L) 02/09/2022 10:47 AM   GFR 66.07 08/11/2021 02:06 PM   MICROALBUR 1.3 08/09/2014 10:27 AM   MICROALBUR 2.4 (H) 07/13/2010 10:58 AM    Last diabetic Eye exam: No results found for: "HMDIABEYEEXA"  Last diabetic Foot exam: No results found for: "HMDIABFOOTEX"   Lab Results  Component Value Date   CHOL 198 08/11/2021   HDL 55.20 08/11/2021   LDLCALC 123 (H) 08/11/2021   LDLDIRECT 176.1 07/20/2012   TRIG 102.0 08/11/2021  CHOLHDL 4 08/11/2021       Latest Ref Rng & Units 08/11/2021    2:06 PM 11/05/2020   12:06 PM 10/28/2020    4:31 AM  Hepatic Function  Total Protein 6.0 - 8.3 g/dL 6.6  6.3  5.5   Albumin 3.5 - 5.2 g/dL 4.4  3.6  3.2   AST 0 - 37 U/L 28  31  64   ALT 0 - 53 U/L '21  26  24   ' Alk Phosphatase 39 - 117 U/L 63  60  48   Total Bilirubin 0.2 - 1.2 mg/dL 0.9  1.1  0.8   Bilirubin, Direct 0.0 -  0.3 mg/dL 0.1  0.1      Lab Results  Component Value Date/Time   TSH 1.54 08/11/2021 02:06 PM   TSH 2.68 06/24/2020 11:04 AM       Latest Ref Rng & Units 02/09/2022   10:47 AM 08/11/2021    2:06 PM 11/05/2020   12:06 PM  CBC  WBC 4.0 - 10.5 K/uL 8.4  6.8  7.2   Hemoglobin 13.0 - 17.0 g/dL 14.4  14.2  11.5   Hematocrit 39.0 - 52.0 % 42.5  43.1  34.4   Platelets 150.0 - 400.0 K/uL 148.0  146.0  213     No results found for: "VD25OH"  Clinical ASCVD: Yes  The ASCVD Risk score (Arnett DK, et al., 2019) failed to calculate for the following reasons:   The 2019 ASCVD risk score is only valid for ages 76 to 7       02/09/2022   10:45 AM 10/07/2021   10:52 AM 09/15/2021   10:53 AM  Depression screen PHQ 2/9  Decreased Interest 1 0 0  Down, Depressed, Hopeless  0 0  PHQ - 2 Score 1 0 0  Altered sleeping 3 0 0  Tired, decreased energy 3 0 2  Change in appetite 1 0 0  Feeling bad or failure about yourself  3 0 0  Trouble concentrating 1 0 0  Moving slowly or fidgety/restless 2 0 0  Suicidal thoughts 0 0 0  PHQ-9 Score 14 0 2  Difficult doing work/chores Extremely dIfficult Not difficult at all      CHA2DS2/VAS Stroke Risk Points  Current as of just now     5 >= 2 Points: High Risk  1 - 1.99 Points: Medium Risk  0 Points: Low Risk    Last Change: N/A      Details    This score determines the patient's risk of having a stroke if the  patient has atrial fibrillation.       Points Metrics  0 Has Congestive Heart Failure:  No    Current as of just now  1 Has Vascular Disease:  Yes    Current as of just now  1 Has Hypertension:  Yes    Current as of just now  2 Age:  8    Current as of just now  1 Has Diabetes:  Yes    Current as of just now  0 Had Stroke:  No  Had TIA:  No  Had Thromboembolism:  No    Current as of just now  0 Male:  No    Current as of just now    Social History   Tobacco Use  Smoking Status Never  Smokeless Tobacco Never   BP Readings from  Last 3 Encounters:  02/09/22 118/60  11/06/21 137/78  09/15/21  136/80   Pulse Readings from Last 3 Encounters:  02/09/22 71  11/06/21 60  09/15/21 60   Wt Readings from Last 3 Encounters:  02/09/22 162 lb 2 oz (73.5 kg)  11/06/21 163 lb (73.9 kg)  10/07/21 160 lb (72.6 kg)   BMI Readings from Last 3 Encounters:  02/09/22 26.98 kg/m  11/06/21 27.12 kg/m  10/07/21 26.63 kg/m    Assessment/Interventions: Review of patient past medical history, allergies, medications, health status, including review of consultants reports, laboratory and other test data, was performed as part of comprehensive evaluation and provision of chronic care management services.   SDOH:  (Social Determinants of Health) assessments and interventions performed: Yes  SDOH Interventions    Flowsheet Row Chronic Care Management from 06/23/2022 in [place] at [place] from 10/07/2021 in [place] at [place] Management from 04/30/2020 in [place] at [place] Interventions -- Intervention Not Indicated --  Housing Interventions -- Intervention Not Indicated --  Transportation Interventions -- Intervention Not Indicated Intervention Not Indicated  Financial Strain Interventions Intervention Not Indicated Intervention Not Indicated Intervention Not Indicated  Physical Activity Interventions -- Intervention Not Indicated --  Social Connections Interventions -- Intervention Not Indicated --       CCM Care Plan  Allergies  Allergen Reactions   Codeine Rash and Other (See Comments)    Dizziness, low blood pressure and heart rate   Hydrocodone-Acetaminophen Rash and Other (See Comments)    Dizziness, low blood pressure and heart rate   Tramadol Nausea And Vomiting   Lipitor [Atorvastatin] Other (See Comments)    Myalgias    Robaxin [Methocarbamol] Other (See Comments)    Dropped blood pressure too much    Zocor [Simvastatin] Other (See Comments)    myalgias    Medications Reviewed Today     Reviewed by Caron Presume, LPN (Licensed Practical Nurse) on 03/15/22 at 1135  Med List Status: <None>   Medication Order Taking? Sig Documenting Provider Last Dose Status Informant  acetaminophen (TYLENOL) 500 MG tablet [id number] No Take 1,000 mg by mouth every 4 (four) hours as needed for mild pain. [provider] Taking Active Spouse/Significant Other           Med Note Daymon Larsen   Sat Oct 25, 2020  1:44 PM) For covid treatment.  ascorbic acid (VITAMIN C) 500 MG tablet [id number] No Take 500 mg by mouth daily. [provider] Taking Active Spouse/Significant Other  aspirin EC 81 MG tablet [id number] No Take 81 mg by mouth daily. [provider] Taking Active Spouse/Significant Other  atenolol (TENORMIN) 25 MG tablet [id number] No Take 25 mg by mouth daily. [provider] Taking Active Spouse/Significant Other  b complex vitamins tablet [id number] No Take 1 tablet by mouth daily. [provider] Taking Active Spouse/Significant Other  benzonatate (TESSALON) 200 MG capsule [id number] No Take 1 capsule (200 mg total) by mouth every 6 (six) hours as needed for cough. Laurey Morale, MD Taking Active   Biotin 5000 MCG CAPS [id number] No Take 5,000 mcg by mouth daily.  [provider] Taking Active Spouse/Significant Other  CALCIUM CITRATE-VITAMIN D PO [id number] No Take 1 tablet by mouth 2 (two) times daily. [phone] [provider] Taking Active Spouse/Significant Other           Med Note Daymon Larsen   Sat Oct 25, 2020  1:40 PM)    Cholecalciferol (VITAMIN  D3 PO) [id number] No Take by mouth daily at 2 PM. [provider] Taking Active   donepezil (ARICEPT) 10 MG tablet [id number] No Take 1 tablet (10 mg total) by mouth at bedtime. Laurey Morale, MD Taking Active   ezetimibe (ZETIA) 10 MG tablet [id number] No Take 1 tablet (10 mg  total) by mouth daily. Laurey Morale, MD Taking Active   memantine Avala) 10 MG tablet [id number] No TAKE 1 TABLET BY MOUTH  TWICE DAILY Laurey Morale, MD Taking Active   mirabegron ER (MYRBETRIQ) 50 MG TB24 tablet [id number] No Take 1 tablet (50 mg total) by mouth daily. Laurey Morale, MD Taking Active   Multiple Vitamin (MULTIVITAMIN WITH MINERALS) TABS tablet [id number] No Take 1 tablet by mouth daily. SENIOR MULTIVITAMIN [provider] Taking Active Spouse/Significant Other  warfarin (COUMADIN) 5 MG tablet [id number] No TAKE 1 TABLET BY MOUTH  DAILY EXCEPT 1.5 TABLETS ON WEDNESDAY AND SATURDAY OR  AS DIRECTED BY  ANTICOAGULATION CLINIC Laurey Morale, MD Taking Active             Patient Active Problem List   Diagnosis Date Noted   Thoracic aortic aneurysm ([place]) 12/01/2020   Macrocytic anemia 10/26/2020   COVID-19 virus infection 10/15/2020   Arthritis 02/13/2019   Pulmonary infiltrates on CXR 06/21/2018   Upper airway cough syndrome 06/20/2018   Type 2 diabetes mellitus with other specified complication ([place]) 83/05/4075   Lower extremity weakness    Radiculopathy 10/13/2016   Syncope 10/13/2016   CAD (coronary artery disease) 10/13/2016   Memory loss 02/22/2014   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/13/2010   KNEE SPRAIN 11/10/2009   INGUINAL HERNIA 09/17/2009   CHEST WALL PAIN, ACUTE 09/17/2009   Dyslipidemia 09/13/2008   OVERACTIVE BLADDER 01/26/2008   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   Essential hypertension 05/22/2007   GERD 05/22/2007   ATRIAL FIBRILLATION, CHRONIC 05/01/2007    Immunization History  Administered Date(s) Administered   Fluad Quad(high Dose 65+) 06/15/2019, 06/24/2020, 07/15/2021   Influenza Split 06/07/2011, 06/21/2012   Influenza Whole 07/07/2005, 06/09/2007, 06/07/2008, 06/18/2009, 07/13/2010   Influenza, High Dose Seasonal PF 06/15/2016, 06/23/2017, 07/24/2018   Influenza,inj,Quad PF,6+ Mos  07/26/2013, 05/20/2014, 04/28/2015   Influenza-Unspecified 06/07/2011, 06/21/2012, 07/26/2013, 05/20/2014, 04/28/2015, 06/15/2016, 06/23/2017, 07/24/2018   Pneumococcal Conjugate-13 08/09/2014   Pneumococcal Polysaccharide-23 07/20/2012   Pneumococcal-Unspecified 07/20/2012, 08/09/2014   Td 07/13/2010   Tdap 07/13/2010   Zoster Recombinat (Shingrix) 01/15/2022, 03/22/2022     Patient is still taking Gemtesa and seems to be making him really sleepy when he takes the full tablet so they have been experimenting with taking slightly less than that. This seems to be working the best than anything else he has been on so they are trying 2/3 of the tablets seems to be helping more.  She is going to try to cut it with right now because the 1/2 tablet was not helping his symptoms as much.  Patient did not ever start on the Zetia because he was working on the bladder problem first. His wife then looked into side effects with the Zetia and they decided not to take it despite the small change of muscle related symptoms.  Conditions to be addressed/monitored:  Hypertension, Hyperlipidemia, Diabetes, Atrial Fibrillation, Coronary Artery Disease, GERD, Osteoporosis, Osteoarthritis, Overactive Bladder and BPH   Conditions addressed this visit: Overactive bladder, hyperlipidemia  Care Plan : [place]  Updates made by Viona Gilmore, [place] since 06/23/2022 12:00  AM     Problem: Problem: Hypertension, Hyperlipidemia, Diabetes, Atrial Fibrillation, Coronary Artery Disease, GERD, Osteoporosis, Osteoarthritis, Overactive Bladder and BPH      Long-Range Goal: Patient-Specific Goal   Start Date: 11/26/2020  Expected End Date: 11/26/2021  Recent Progress: On track  Priority: High  Note:   Current Barriers:  Unable to independently monitor therapeutic efficacy  Pharmacist Clinical Goal(s):  Patient will achieve adherence to monitoring guidelines and medication adherence to achieve therapeutic  efficacy through collaboration with PharmD and provider.   Interventions: 1:1 collaboration with Laurey Morale, MD regarding development and update of comprehensive plan of care as evidenced by provider attestation and co-signature Inter-disciplinary care team collaboration (see longitudinal plan of care) Comprehensive medication review performed; medication list updated in electronic medical record  Hypertension (BP goal <140/90) -Controlled -Current treatment: Atenolol 25 mg daily - in AM - Appropriate, Effective, Safe, Accessible Olmesartan 20 mg daily - in AM - Appropriate, Effective, Safe, Accessible -Medications previously tried: n/a -Current home readings: 130-140/70-75; 144/74 (usually < 140s; checking several times a week) -Current dietary habits: did not discuss -Current exercise habits: limited with recovering from hospitalizations -Reports hypotensive/hypertensive symptoms -Educated on Exercise goal of 150 minutes per week; Importance of home blood pressure monitoring; Proper BP monitoring technique; -Counseled to monitor BP at home weekly, document, and provide log at future appointments -Counseled on diet and exercise extensively Recommended to continue current medication  Hyperlipidemia: (LDL goal < 70) -Uncontrolled -Current treatment: No medications -Medications previously tried: Zetia (did not want to start); simvastatin (Myalgias), Atorvastatin (myalgias)   -Current dietary patterns: eating red meat once weekly and rarely eats out at restaurants; using olive oil and uses air fryer -Current exercise habits: limited with recovering from hospitalizations -Educated on Cholesterol goals;  Importance of limiting foods high in cholesterol; Exercise goal of 150 minutes per week; -Counseled on diet and exercise extensively Recommended discussion with PCP about consideration of cholesterol medications  Atrial Fibrillation (Goal: prevent stroke and major  bleeding) -Controlled -CHADSVASC: 5 -Current treatment: Rate control: Atenolol 25 mg daily - Appropriate, Effective, Safe, Accessible Anticoagulation: Warfarin 5 mg 1.5 tablets Wed/Sat, 1 tablet all other days - Appropriate, Effective, Safe, Accessible -Medications previously tried: n/a -Home BP and HR readings: refer to below -Counseled on bleeding risk associated with warfarin and importance of self-monitoring for signs/symptoms of bleeding; avoidance of NSAIDs due to increased bleeding risk with anticoagulants; -Recommended to continue current medication Discussed bleed risk with taking both aspirin and warfarin and will plan to address with cardiology  Osteoporosis (Goal prevent fractures) -Controlled -Last DEXA Scan: 6/22             T-Score femoral neck: -2.5             T-Score total hip: n/a             T-Score lumbar spine: +1.2             T-Score forearm radius: -1.2             10-year probability of major osteoporotic fracture: n/a             10-year probability of hip fracture: n/a -Patient is a candidate for pharmacologic treatment due to T-Score < -2.5 in femoral neck and T-Score < -2.5 in total hip  -Current treatment  Calcium Citrate + Vit D 315-200 mg twice daily - Appropriate, Effective, Safe, Accessible -Medications previously tried: Alendronate (on for 10 years - holiday in 2022) -Recommend [phone] units  of vitamin D daily. Recommend 1200 mg of calcium daily from dietary and supplemental sources. Recommend weight-bearing and muscle strengthening exercises for building and maintaining bone density. -Counseled on diet and exercise extensively Recommended to continue current medication  Memory loss (Goal: maintain memory function) -Controlled -Current treatment  Donepezil 10 mg at bedtime - Appropriate, Effective, Safe, Accessible Memantine 10 mg twice daily - Appropriate, Effective, Safe, Accessible -Medications previously tried: none  -Recommended to  continue current medication   BPH/Overactive bladder (Goal: minimize symptoms) -Controlled -Current treatment  Gemtesa 75 mg 1 tablet daily - Appropriate, Effective, Safe, Accessible -Medications previously tried: oxybutynin (cost), solifenacin (cost), Myrbetriq (sampled only) -Recommended to continue current medication  CAD (Goal: prevent heart events/strokes) -Controlled -Current treatment  Aspirin 81 mg 1 tablet daily - Appropriate, Effective, Safe, Accessible -Medications previously tried: none  -Counseled on risk for bleeding while taking both warfarin and aspirin  Health Maintenance -Vaccine gaps: COVID vaccine (once out of 90 day window) -Current therapy:  APAP 500 mg 2 tablets QHS  Vitamin C 500 mg  Vitamin B Complex daily  Biotin 5000 mcg daily  Multivitamin (Senior Multivitamin) daily  Zinc 50 mg 1 tablet daily  -Educated on Cost vs benefit of each product must be carefully weighed by individual consumer -Patient is satisfied with current therapy and denies issues -Recommended to continue current medication  Patient Goals/Self-Care Activities Patient will:  - take medications as prescribed check blood pressure weekly, document, and provide at future appointments target a minimum of 150 minutes of moderate intensity exercise weekly  Follow Up Plan: Telephone follow up appointment with care management team member scheduled for: 6 months        Medication Assistance:  Gemtesa obtained through [employer] medication assistance program.  Enrollment ends 09/06/23  Compliance/Adherence/Medication fill history: Care Gaps: COVID vaccine, microalbumin, tetanus, eye exam, influenza, foot exam Last BP - 118/60 on 02/09/2022 Last A1C - 6.3 on 02/09/2022  Star-Rating Drugs: None  Patient's preferred pharmacy is:  [facility] ([place], [address] [place] [address] Phone:  [phone] Fax: [phone]  [id number] [place] [address]Phone: [phone]: [phone] [address]Phone: [phone]: [phone] Uses pill box? Yes Pt endorses 100% compliance  We discussed: Current pharmacy is preferred with insurance plan and patient is satisfied with pharmacy services Patient decided to: Continue current medication management strategy  Care Plan and Follow Up Patient Decision:  Patient agrees to Care Plan and Follow-up.  Plan: Telephone follow up appointment with care management team member scheduled for:  6 months  MJeni Salles PharmD BGreshamPharmacist LMaharishi [place]at [phone]

## 2022-06-22 NOTE — Chronic Care Management (AMB) (Signed)
    Chronic Care Management Pharmacy Assistant   Name: Tanner Hensley  MRN: [id number] DOB: 10-01-1940  06/23/2022 APPOINTMENT REMINDER  Tanner Hensley was reminded to have all medications, supplements and any blood glucose and blood pressure readings available for review with Jeni Salles, Pharm. D, at his telephone visit on 06/23/2022 at 10:30.  Care Gaps: AWV - scheduled 10/11/2022 Last BP - 118/60 on 02/09/2022 Last A1C - 6.3 on 02/09/2022 Covid - never done Foot exam - never done Urine ACR - overdue Tdap - overdue Eye exam - overdue Flu - due  Star Rating Drug: None  Any gaps in medications fill history? No  [facility]  Catering manager [phone]

## 2022-06-23 ENCOUNTER — Ambulatory Visit (INDEPENDENT_AMBULATORY_CARE_PROVIDER_SITE_OTHER): Payer: Medicare Other | Admitting: Pharmacist

## 2022-06-23 DIAGNOSIS — N318 Other neuromuscular dysfunction of bladder: Secondary | ICD-10-CM

## 2022-06-23 DIAGNOSIS — I2510 Atherosclerotic heart disease of native coronary artery without angina pectoris: Secondary | ICD-10-CM

## 2022-06-23 DIAGNOSIS — I10 Essential (primary) hypertension: Secondary | ICD-10-CM

## 2022-06-23 NOTE — Patient Instructions (Signed)
Hi Tanner Hensley and Tanner Hensley,  It was great to catch up with you again! Don't forget to bring your blood pressure cuff to your next office visit with Dr. Sarajane Jews to make sure it is still accurate.  Please reach out to me if you have any questions or need anything before our follow up!  Best, Maddie  Jeni Salles, PharmD, [place] [facility] [place]  Visit Information   Goals Addressed   None    Patient Care Plan: CCM Pharmacy Care Plan     Problem Identified: Problem: Hypertension, Hyperlipidemia, Diabetes, Atrial Fibrillation, Coronary Artery Disease, GERD, Osteoporosis, Osteoarthritis, Overactive Bladder and BPH      Long-Range Goal: Patient-Specific Goal   Start Date: 11/26/2020  Expected End Date: 11/26/2021  Recent Progress: On track  Priority: High  Note:   Current Barriers:  Unable to independently monitor therapeutic efficacy  Pharmacist Clinical Goal(s):  Patient will achieve adherence to monitoring guidelines and medication adherence to achieve therapeutic efficacy through collaboration with PharmD and provider.   Interventions: 1:1 collaboration with Laurey Morale, MD regarding development and update of comprehensive plan of care as evidenced by provider attestation and co-signature Inter-disciplinary care team collaboration (see longitudinal plan of care) Comprehensive medication review performed; medication list updated in electronic medical record  Hypertension (BP goal <140/90) -Controlled -Current treatment: Atenolol 25 mg daily - in AM - Appropriate, Effective, Safe, Accessible Olmesartan 20 mg daily - in AM - Appropriate, Effective, Safe, Accessible -Medications previously tried: n/a -Current home readings: 130-140/70-75; 144/74 (usually < 140s; checking several times a week) -Current dietary habits: did not discuss -Current exercise habits: limited with recovering from hospitalizations -Reports  hypotensive/hypertensive symptoms -Educated on Exercise goal of 150 minutes per week; Importance of home blood pressure monitoring; Proper BP monitoring technique; -Counseled to monitor BP at home weekly, document, and provide log at future appointments -Counseled on diet and exercise extensively Recommended to continue current medication  Hyperlipidemia: (LDL goal < 70) -Uncontrolled -Current treatment: No medications -Medications previously tried: Zetia (did not want to start); simvastatin (Myalgias), Atorvastatin (myalgias)   -Current dietary patterns: eating red meat once weekly and rarely eats out at restaurants; using olive oil and uses air fryer -Current exercise habits: limited with recovering from hospitalizations -Educated on Cholesterol goals;  Importance of limiting foods high in cholesterol; Exercise goal of 150 minutes per week; -Counseled on diet and exercise extensively Recommended discussion with PCP about consideration of cholesterol medications  Atrial Fibrillation (Goal: prevent stroke and major bleeding) -Controlled -CHADSVASC: 5 -Current treatment: Rate control: Atenolol 25 mg daily - Appropriate, Effective, Safe, Accessible Anticoagulation: Warfarin 5 mg 1.5 tablets Wed/Sat, 1 tablet all other days - Appropriate, Effective, Safe, Accessible -Medications previously tried: n/a -Home BP and HR readings: refer to below -Counseled on bleeding risk associated with warfarin and importance of self-monitoring for signs/symptoms of bleeding; avoidance of NSAIDs due to increased bleeding risk with anticoagulants; -Recommended to continue current medication Discussed bleed risk with taking both aspirin and warfarin and will plan to address with cardiology  Osteoporosis (Goal prevent fractures) -Controlled -Last DEXA Scan: 6/22             T-Score femoral neck: -2.5             T-Score total hip: n/a             T-Score lumbar spine: +1.2             T-Score forearm  radius: -1.2  10-year probability of major osteoporotic fracture: n/a             10-year probability of hip fracture: n/a -Patient is a candidate for pharmacologic treatment due to T-Score < -2.5 in femoral neck and T-Score < -2.5 in total hip  -Current treatment  Calcium Citrate + Vit D 315-200 mg twice daily - Appropriate, Effective, Safe, Accessible -Medications previously tried: Alendronate (on for 10 years - holiday in 2022) -Recommend [phone] units of vitamin D daily. Recommend 1200 mg of calcium daily from dietary and supplemental sources. Recommend weight-bearing and muscle strengthening exercises for building and maintaining bone density. -Counseled on diet and exercise extensively Recommended to continue current medication  Memory loss (Goal: maintain memory function) -Controlled -Current treatment  Donepezil 10 mg at bedtime - Appropriate, Effective, Safe, Accessible Memantine 10 mg twice daily - Appropriate, Effective, Safe, Accessible -Medications previously tried: none  -Recommended to continue current medication   BPH/Overactive bladder (Goal: minimize symptoms) -Controlled -Current treatment  Gemtesa 75 mg 1 tablet daily - Appropriate, Effective, Safe, Accessible -Medications previously tried: oxybutynin (cost), solifenacin (cost), Myrbetriq (sampled only) -Recommended to continue current medication  CAD (Goal: prevent heart events/strokes) -Controlled -Current treatment  Aspirin 81 mg 1 tablet daily - Appropriate, Effective, Safe, Accessible -Medications previously tried: none  -Counseled on risk for bleeding while taking both warfarin and aspirin  Health Maintenance -Vaccine gaps: COVID vaccine (once out of 90 day window) -Current therapy:  APAP 500 mg 2 tablets QHS  Vitamin C 500 mg  Vitamin B Complex daily  Biotin 5000 mcg daily  Multivitamin (Senior Multivitamin) daily  Zinc 50 mg 1 tablet daily  -Educated on Cost vs benefit of each product  must be carefully weighed by individual consumer -Patient is satisfied with current therapy and denies issues -Recommended to continue current medication  Patient Goals/Self-Care Activities Patient will:  - take medications as prescribed check blood pressure weekly, document, and provide at future appointments target a minimum of 150 minutes of moderate intensity exercise weekly  Follow Up Plan: Telephone follow up appointment with care management team member scheduled for: 6 months        Patient verbalizes understanding of instructions and care plan provided today and agrees to view in [place]. Active MyChart status and patient understanding of how to access instructions and care plan via MyChart confirmed with patient.    Telephone follow up appointment with pharmacy team member scheduled for: 6 months  Viona Gilmore, [facility]

## 2022-06-28 ENCOUNTER — Ambulatory Visit: Payer: Medicare Other

## 2022-06-28 ENCOUNTER — Other Ambulatory Visit: Payer: Self-pay | Admitting: Family Medicine

## 2022-06-28 ENCOUNTER — Ambulatory Visit (INDEPENDENT_AMBULATORY_CARE_PROVIDER_SITE_OTHER): Payer: Medicare Other

## 2022-06-28 DIAGNOSIS — Z7901 Long term (current) use of anticoagulants: Secondary | ICD-10-CM

## 2022-06-28 DIAGNOSIS — Z23 Encounter for immunization: Secondary | ICD-10-CM

## 2022-06-28 LAB — POCT INR: INR: 2.9 (ref 2.0–3.0)

## 2022-06-28 NOTE — Progress Notes (Signed)
Continue to take 1 tablet daily except take 1/2 tablet on Mon, Wed, and Saturdays.  Re-check in 6 weeks.

## 2022-06-28 NOTE — Patient Instructions (Signed)
Continue to take 1 tablet daily except take 1/2 tablet on Mon, Wed, and Saturdays.  Re-check in 6 weeks.

## 2022-07-06 DIAGNOSIS — N3281 Overactive bladder: Secondary | ICD-10-CM | POA: Diagnosis not present

## 2022-07-06 DIAGNOSIS — I10 Essential (primary) hypertension: Secondary | ICD-10-CM

## 2022-07-06 DIAGNOSIS — I2510 Atherosclerotic heart disease of native coronary artery without angina pectoris: Secondary | ICD-10-CM | POA: Diagnosis not present

## 2022-07-06 DIAGNOSIS — N401 Enlarged prostate with lower urinary tract symptoms: Secondary | ICD-10-CM

## 2022-07-06 DIAGNOSIS — I4891 Unspecified atrial fibrillation: Secondary | ICD-10-CM | POA: Diagnosis not present

## 2022-07-06 DIAGNOSIS — E785 Hyperlipidemia, unspecified: Secondary | ICD-10-CM

## 2022-07-06 DIAGNOSIS — M810 Age-related osteoporosis without current pathological fracture: Secondary | ICD-10-CM | POA: Diagnosis not present

## 2022-07-14 ENCOUNTER — Telehealth: Payer: Self-pay | Admitting: *Deleted

## 2022-07-14 ENCOUNTER — Encounter: Payer: Self-pay | Admitting: *Deleted

## 2022-07-14 NOTE — Patient Instructions (Signed)
Visit Information  Thank you for taking time to visit with me today. Please don't hesitate to contact me if I can be of assistance to you.   Following are the goals we discussed today:   Goals Addressed             This Visit's Progress    COMPLETED: No needs       Care Coordination Interventions: Provided education to patient and/or caregiver about advanced directives Reviewed medications with patient and discussed adherence with no needed refills Reviewed scheduled/upcoming provider appointments including pending appointments with sufficient transportation Assessed social determinant of health barriers Declined any case management services at this time with no needs however will provide contact number for any related issues that may arise         Please call the care guide team at [phone] if you need to cancel or reschedule your appointment.   If you are experiencing a Mental Health or [place] or need someone to talk to, please call the Suicide and Crisis Lifeline: 988  Patient verbalizes understanding of instructions and care plan provided today and agrees to view in [place]. Active MyChart status and patient understanding of how to access instructions and care plan via MyChart confirmed with patient.     No further follow up required: No needs    Raina Mina, RN Care Management Coordinator [place] Office [phone]

## 2022-07-14 NOTE — Patient Outreach (Signed)
  Care Coordination   Initial Visit Note   07/14/2022 Name: Tanner Hensley MRN: [id number] DOB: [birth year]  Tanner Hensley is a 81 y.o. year old male who sees Laurey Morale, MD for primary care. I  spoke with spouse Tanner Hensley.  What matters to the patients health and wellness today?  No needs    Goals Addressed             This Visit's Progress    COMPLETED: No needs       Care Coordination Interventions: Provided education to patient and/or caregiver about advanced directives Reviewed medications with patient and discussed adherence with no needed refills Reviewed scheduled/upcoming provider appointments including pending appointments with sufficient transportation Assessed social determinant of health barriers Declined any case management services at this time with no needs however will provide contact number for any related issues that may arise         SDOH assessments and interventions completed:  Yes  SDOH Interventions Today    Flowsheet Row Most Recent Value  SDOH Interventions   Food Insecurity Interventions Intervention Not Indicated  Housing Interventions Intervention Not Indicated  Transportation Interventions Intervention Not Indicated  Utilities Interventions Intervention Not Indicated        Care Coordination Interventions Activated:  Yes  Care Coordination Interventions:  Yes, provided   Follow up plan: No further intervention required.   Encounter Outcome:  Pt. Visit Completed   Raina Mina, RN Care Management Coordinator [place] Office [phone]

## 2022-07-20 ENCOUNTER — Telehealth: Payer: Self-pay | Admitting: Family Medicine

## 2022-07-20 DIAGNOSIS — I10 Essential (primary) hypertension: Secondary | ICD-10-CM

## 2022-07-20 DIAGNOSIS — I4811 Longstanding persistent atrial fibrillation: Secondary | ICD-10-CM

## 2022-07-20 NOTE — Telephone Encounter (Signed)
Renewed the referral for CCM management

## 2022-08-06 DIAGNOSIS — I499 Cardiac arrhythmia, unspecified: Secondary | ICD-10-CM | POA: Diagnosis not present

## 2022-08-06 DIAGNOSIS — I4729 Other ventricular tachycardia: Secondary | ICD-10-CM | POA: Diagnosis not present

## 2022-08-06 DIAGNOSIS — Z45018 Encounter for adjustment and management of other part of cardiac pacemaker: Secondary | ICD-10-CM | POA: Diagnosis not present

## 2022-08-09 ENCOUNTER — Ambulatory Visit: Payer: Medicare Other

## 2022-08-11 ENCOUNTER — Ambulatory Visit (INDEPENDENT_AMBULATORY_CARE_PROVIDER_SITE_OTHER): Payer: Medicare Other

## 2022-08-11 DIAGNOSIS — Z7901 Long term (current) use of anticoagulants: Secondary | ICD-10-CM

## 2022-08-11 LAB — POCT INR: INR: 4 — AB (ref 2.0–3.0)

## 2022-08-11 NOTE — Patient Instructions (Addendum)
Pre visit review using our clinic review tool, if applicable. No additional management support is needed unless otherwise documented below in the visit note.  Pt already took today's dose of warfarin.  Hold dose tomorrow and then change weekly dose to take 1/2 tablet daily except take 1 tablet on Tuesday, Thursday and Friday. Recheck in 2 weeks.

## 2022-08-11 NOTE — Progress Notes (Signed)
Pt reports his heart monitor was analyzed yesterday and he was asked to come in for an apt with cardiology tomorrow. He is not sure what the cardiologist is going to say about the monitor reading.   Pt already took today's dose of warfarin.  Hold dose tomorrow and then change weekly dose to take 1/2 tablet daily except take 1 tablet on Tuesday, Thursday and Friday. Recheck in 2 weeks.

## 2022-08-12 DIAGNOSIS — I4729 Other ventricular tachycardia: Secondary | ICD-10-CM | POA: Diagnosis not present

## 2022-08-12 DIAGNOSIS — I4821 Permanent atrial fibrillation: Secondary | ICD-10-CM | POA: Diagnosis not present

## 2022-08-12 DIAGNOSIS — I2510 Atherosclerotic heart disease of native coronary artery without angina pectoris: Secondary | ICD-10-CM | POA: Diagnosis not present

## 2022-08-12 DIAGNOSIS — Z79899 Other long term (current) drug therapy: Secondary | ICD-10-CM | POA: Diagnosis not present

## 2022-08-19 ENCOUNTER — Encounter: Payer: Medicare Other | Admitting: Family Medicine

## 2022-08-20 ENCOUNTER — Encounter: Payer: Self-pay | Admitting: Family Medicine

## 2022-08-20 ENCOUNTER — Ambulatory Visit (INDEPENDENT_AMBULATORY_CARE_PROVIDER_SITE_OTHER): Payer: Medicare Other | Admitting: Family Medicine

## 2022-08-20 VITALS — BP 142/80 | HR 59 | Temp 98.1°F | Ht 65.0 in | Wt 166.0 lb

## 2022-08-20 DIAGNOSIS — R413 Other amnesia: Secondary | ICD-10-CM

## 2022-08-20 DIAGNOSIS — R29898 Other symptoms and signs involving the musculoskeletal system: Secondary | ICD-10-CM

## 2022-08-20 DIAGNOSIS — K219 Gastro-esophageal reflux disease without esophagitis: Secondary | ICD-10-CM

## 2022-08-20 DIAGNOSIS — I4811 Longstanding persistent atrial fibrillation: Secondary | ICD-10-CM

## 2022-08-20 DIAGNOSIS — N401 Enlarged prostate with lower urinary tract symptoms: Secondary | ICD-10-CM | POA: Diagnosis not present

## 2022-08-20 DIAGNOSIS — I10 Essential (primary) hypertension: Secondary | ICD-10-CM | POA: Diagnosis not present

## 2022-08-20 DIAGNOSIS — M353 Polymyalgia rheumatica: Secondary | ICD-10-CM

## 2022-08-20 DIAGNOSIS — N318 Other neuromuscular dysfunction of bladder: Secondary | ICD-10-CM

## 2022-08-20 DIAGNOSIS — N138 Other obstructive and reflux uropathy: Secondary | ICD-10-CM

## 2022-08-20 DIAGNOSIS — M1990 Unspecified osteoarthritis, unspecified site: Secondary | ICD-10-CM | POA: Diagnosis not present

## 2022-08-20 DIAGNOSIS — I2510 Atherosclerotic heart disease of native coronary artery without angina pectoris: Secondary | ICD-10-CM | POA: Diagnosis not present

## 2022-08-20 DIAGNOSIS — E1169 Type 2 diabetes mellitus with other specified complication: Secondary | ICD-10-CM

## 2022-08-20 DIAGNOSIS — E785 Hyperlipidemia, unspecified: Secondary | ICD-10-CM

## 2022-08-20 LAB — HEPATIC FUNCTION PANEL
ALT: 24 U/L (ref 0–53)
AST: 28 U/L (ref 0–37)
Albumin: 4.3 g/dL (ref 3.5–5.2)
Alkaline Phosphatase: 64 U/L (ref 39–117)
Bilirubin, Direct: 0.2 mg/dL (ref 0.0–0.3)
Total Bilirubin: 0.9 mg/dL (ref 0.2–1.2)
Total Protein: 6.5 g/dL (ref 6.0–8.3)

## 2022-08-20 LAB — CBC WITH DIFFERENTIAL/PLATELET
Basophils Absolute: 0 10*3/uL (ref 0.0–0.1)
Basophils Relative: 0.4 % (ref 0.0–3.0)
Eosinophils Absolute: 0.1 10*3/uL (ref 0.0–0.7)
Eosinophils Relative: 1.4 % (ref 0.0–5.0)
HCT: 41.9 % (ref 39.0–52.0)
Hemoglobin: 14.2 g/dL (ref 13.0–17.0)
Lymphocytes Relative: 10.1 % — ABNORMAL LOW (ref 12.0–46.0)
Lymphs Abs: 0.8 10*3/uL (ref 0.7–4.0)
MCHC: 33.9 g/dL (ref 30.0–36.0)
MCV: 106.8 fl — ABNORMAL HIGH (ref 78.0–100.0)
Monocytes Absolute: 0.8 10*3/uL (ref 0.1–1.0)
Monocytes Relative: 9.5 % (ref 3.0–12.0)
Neutro Abs: 6.4 10*3/uL (ref 1.4–7.7)
Neutrophils Relative %: 78.6 % — ABNORMAL HIGH (ref 43.0–77.0)
Platelets: 136 10*3/uL — ABNORMAL LOW (ref 150.0–400.0)
RBC: 3.92 Mil/uL — ABNORMAL LOW (ref 4.22–5.81)
RDW: 12.9 % (ref 11.5–15.5)
WBC: 8.2 10*3/uL (ref 4.0–10.5)

## 2022-08-20 LAB — LIPID PANEL
Cholesterol: 224 mg/dL — ABNORMAL HIGH (ref 0–200)
HDL: 58.8 mg/dL (ref >39.00)
LDL Cholesterol: 145 mg/dL — ABNORMAL HIGH (ref 0–99)
NonHDL: 165.14
Total CHOL/HDL Ratio: 4
Triglycerides: 102 mg/dL (ref 0.0–149.0)
VLDL: 20.4 mg/dL (ref 0.0–40.0)

## 2022-08-20 LAB — BASIC METABOLIC PANEL
BUN: 17 mg/dL (ref 6–23)
CO2: 31 mEq/L (ref 19–32)
Calcium: 10.3 mg/dL (ref 8.4–10.5)
Chloride: 105 mEq/L (ref 96–112)
Creatinine, Ser: 1.2 mg/dL (ref 0.40–1.50)
GFR: 56.52 mL/min — ABNORMAL LOW (ref >60.00)
Glucose, Bld: 112 mg/dL — ABNORMAL HIGH (ref 70–99)
Potassium: 5.1 mEq/L (ref 3.5–5.1)
Sodium: 142 mEq/L (ref 135–145)

## 2022-08-20 LAB — TSH: TSH: 2.29 u[IU]/mL (ref 0.35–5.50)

## 2022-08-20 LAB — PSA: PSA: 2.01 ng/mL (ref 0.10–4.00)

## 2022-08-20 LAB — HEMOGLOBIN A1C: Hgb A1c MFr Bld: 6.2 % (ref 4.6–6.5)

## 2022-08-20 NOTE — Progress Notes (Signed)
Subjective:    Patient ID: Tanner Hensley, male    DOB: 1941-03-13, 81 y.o.   MRN: [id number]  HPI Here to follow up on issues. He has no complaints today. He sees Cardiology regularly. 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 His HTN and CAD are stable. His diabetes is well controlled. His memory loss is stable, per his wife.    Review of Systems  Constitutional: Negative.   HENT: Negative.    Eyes: Negative.   Respiratory: Negative.    Cardiovascular: Negative.   Gastrointestinal: Negative.   Genitourinary: Negative.   Musculoskeletal: Negative.   Skin: Negative.   Neurological: Negative.   Psychiatric/Behavioral: Negative.         Objective:   Physical Exam Constitutional:      General: He is not in acute distress.    Appearance: Normal appearance. He is well-developed. He is not diaphoretic.  HENT:     Head: Normocephalic and atraumatic.     Right Ear: External ear normal.     Left Ear: External ear normal.     Nose: Nose normal.     Mouth/Throat:     Pharynx: No oropharyngeal exudate.  Eyes:     General: No scleral icterus.       Right eye: No discharge.        Left eye: No discharge.     Conjunctiva/sclera: Conjunctivae normal.     Pupils: Pupils are equal, round, and reactive to light.  Neck:     Thyroid: No thyromegaly.     Vascular: No JVD.     Trachea: No tracheal deviation.  Cardiovascular:     Rate and Rhythm: Normal rate and regular rhythm.     Heart sounds: Normal heart sounds. No murmur heard.    No friction rub. No gallop.  Pulmonary:     Effort: Pulmonary effort is normal. No respiratory distress.     Breath sounds: Normal breath sounds. No wheezing or rales.  Chest:     Chest wall: No tenderness.  Abdominal:     General: Bowel sounds are normal. There is no distension.     Palpations: Abdomen is soft. There is no mass.     Tenderness:  There is no abdominal tenderness. There is no guarding or rebound.  Genitourinary:    Penis: No tenderness.   Musculoskeletal:        General: No tenderness. Normal range of motion.     Cervical back: Neck supple.  Lymphadenopathy:     Cervical: No cervical adenopathy.  Skin:    General: Skin is warm and dry.     Coloration: Skin is not pale.     Findings: No erythema or rash.  Neurological:     Mental Status: He is alert and oriented to person, place, and time.     Cranial Nerves: No cranial nerve deficit.     Motor: No abnormal muscle tone.     Coordination: Coordination normal.     Deep Tendon Reflexes: Reflexes are normal and symmetric. Reflexes normal.  Psychiatric:        Behavior: Behavior normal.        Thought Content: Thought content normal.        Judgment: Judgment normal.           Assessment & Plan:  He is doing well with HTN, CAD, and chronic atrial  fibrillation. His GERD is stable. His memory loss is stable. His OAB and BPH are stable. Get fasting labs to check lipids, A1c, etc. We spent a total of ( 34  ) minutes reviewing records and discussing these issues.  Alysia Penna, MD

## 2022-08-23 ENCOUNTER — Ambulatory Visit: Payer: Medicare Other | Admitting: Podiatry

## 2022-08-25 ENCOUNTER — Ambulatory Visit (INDEPENDENT_AMBULATORY_CARE_PROVIDER_SITE_OTHER): Payer: Medicare Other

## 2022-08-25 DIAGNOSIS — Z7901 Long term (current) use of anticoagulants: Secondary | ICD-10-CM | POA: Diagnosis not present

## 2022-08-25 LAB — POCT INR: INR: 1.6 — AB (ref 2.0–3.0)

## 2022-08-25 NOTE — Progress Notes (Cosign Needed Addendum)
Pt denies any changes. Last INR was 4.0 so dosing changes were made. Pt is 1.6 today and does not know why the large change in INR.  Increase dose today to take 1 tablet and increase dose tomorrow and then continue  1/2 tablet daily except take 1 tablet on Tuesday, Thursday and Friday. Recheck in 2 weeks.

## 2022-08-25 NOTE — Patient Instructions (Addendum)
Pre visit review using our clinic review tool, if applicable. No additional management support is needed unless otherwise documented below in the visit note.  Increase dose today to take 1 tablet and increase dose tomorrow and then continue  1/2 tablet daily except take 1 tablet on Tuesday, Thursday and Friday. Recheck in 2 weeks.

## 2022-08-26 ENCOUNTER — Ambulatory Visit: Payer: Medicare Other | Admitting: Podiatry

## 2022-08-26 DIAGNOSIS — Z7901 Long term (current) use of anticoagulants: Secondary | ICD-10-CM | POA: Diagnosis not present

## 2022-08-26 DIAGNOSIS — B351 Tinea unguium: Secondary | ICD-10-CM | POA: Diagnosis not present

## 2022-08-26 DIAGNOSIS — M79674 Pain in right toe(s): Secondary | ICD-10-CM | POA: Diagnosis not present

## 2022-08-26 DIAGNOSIS — L84 Corns and callosities: Secondary | ICD-10-CM | POA: Diagnosis not present

## 2022-08-26 DIAGNOSIS — M79675 Pain in left toe(s): Secondary | ICD-10-CM | POA: Diagnosis not present

## 2022-08-27 ENCOUNTER — Telehealth: Payer: Self-pay

## 2022-08-27 NOTE — Progress Notes (Signed)
  Chronic Care Management   Note  08/27/2022 Name: Tanner Hensley MRN: [id number] DOB: 1941-07-17  Tanner Hensley is a 81 y.o. year old male who is a primary care patient of Laurey Morale, MD. I reached out to [name] by phone today in response to a referral sent by Tanner Hensley's PCP.  Tanner Hensley was given information about Chronic Care Management services today including:  CCM service includes personalized support from designated clinical staff supervised by the physician, including individualized plan of care and coordination with other care providers 24/7 contact phone numbers for assistance for urgent and routine care needs. Service will only be billed when office clinical staff spend 20 minutes or more in a month to coordinate care. Only one practitioner may furnish and bill the service in a calendar month. The patient may stop CCM services at amy time (effective at the end of the month) by phone call to the office staff. The patient will be responsible for cost sharing (co-pay) or up to 20% of the service fee (after annual deductible is met)  Tanner Hensley  agreedto scheduling an appointment with the CCM RN Case Manager   Follow up plan: Patient agreed to scheduled appointment with RN Case Manager on 09/16/2022(date/time).   Noreene Larsson, [place],  [address] Direct Dial: [phone] [email or web address]

## 2022-08-28 NOTE — Progress Notes (Signed)
Subjective: 81 y.o. returns the office today for painful, elongated, thickened toenails which he cannot trim himself.  Denies any open sores.  No swelling redness or any drainage.  He has no new concerns today.    Currently on warfarin  PCP: Laurey Morale, MD Last seen August 20, 2022 Last A1c was 6.2 on August 20, 2022  Objective: AAO 3, NAD DP/PT pulses palpable, CRT less than 3 seconds Nails hypertrophic, dystrophic, elongated, brittle, discolored 10. There is tenderness overlying the nails 1-5 bilaterally. There is no surrounding erythema or drainage along the nail sites.  Incurvation of nail border. Hyperkeratotic lesion the distal aspect the left second toe from a hammertoe deformity.  There is no underlying ulceration identified today. No open lesions are noted. No pain with calf compression, swelling, warmth, erythema.  Assessment: Patient presents with symptomatic onychomycosis, hyperkeratotic lesion due to hammertoe  Plan: -Treatment options including alternatives, risks, complications were discussed -Nails sharply debrided 10 without complication/bleeding.  -Hyperkeratotic lesion sharply debrided x1 without any complications or bleeding. Offloading  -Discussed daily foot inspection. If there are any changes, to call the office immediately.  -Follow-up in 3 months or sooner if any problems are to arise. In the meantime, encouraged to call the office with any questions, concerns, changes symptoms.  Celesta Gentile, DPM

## 2022-09-08 ENCOUNTER — Ambulatory Visit (INDEPENDENT_AMBULATORY_CARE_PROVIDER_SITE_OTHER): Payer: Medicare Other

## 2022-09-08 DIAGNOSIS — Z7901 Long term (current) use of anticoagulants: Secondary | ICD-10-CM | POA: Diagnosis not present

## 2022-09-08 LAB — POCT INR: INR: 2.2 (ref 2.0–3.0)

## 2022-09-08 NOTE — Patient Instructions (Addendum)
Pre visit review using our clinic review tool, if applicable. No additional management support is needed unless otherwise documented below in the visit note.  Continue 1/2 tablet daily except take 1 tablet on Tuesday, Thursday and Friday. Recheck in 4 weeks.

## 2022-09-08 NOTE — Progress Notes (Signed)
Continue 1/2 tablet daily except take 1 tablet on Tuesday, Thursday and Friday. Recheck in 4 weeks.

## 2022-09-14 ENCOUNTER — Telehealth: Payer: Self-pay | Admitting: Pharmacist

## 2022-09-14 NOTE — Progress Notes (Unsigned)
Care Management & Coordination Services Pharmacy Team  Name: Tanner Hensley  MRN: [id number] DOB: 27-Jan-1941  Reason for Encounter: Hypertension  Contacted patient on 09/15/2022 to discuss hypertension disease state.   Recent office visits:  08/20/2022 Alysia Penna MD - Patient was seen for essential hypertension and additional concerns. No medication changes.   Recent consult visits:  08/26/2022 Celesta Gentile DPM - Patient was seen for Dermatophytosis of nail and additional concerns. No medication changes.   08/12/2022 Genevie Ann NP (cardiology) - Patient was seen for NSVT and additional concerns. No medication changes.  Hospital visits:  None  Medications: Outpatient Encounter Medications as of 09/14/2022  Medication Sig Note   acetaminophen (TYLENOL) 500 MG tablet Take 1,000 mg by mouth every 4 (four) hours as needed for mild pain. 10/25/2020: For covid treatment.   ascorbic acid (VITAMIN C) 500 MG tablet Take 500 mg by mouth daily.    aspirin EC 81 MG tablet Take 81 mg by mouth daily.    atenolol (TENORMIN) 25 MG tablet Take 25 mg by mouth daily.    b complex vitamins tablet Take 1 tablet by mouth daily.    benzonatate (TESSALON) 200 MG capsule Take 1 capsule (200 mg total) by mouth every 6 (six) hours as needed for cough.    Biotin 5000 MCG CAPS Take 5,000 mcg by mouth daily.     CALCIUM CITRATE-VITAMIN D PO Take 1 tablet by mouth 2 (two) times daily. [phone]    Cholecalciferol (VITAMIN D3 PO) Take by mouth daily at 2 PM.    donepezil (ARICEPT) 10 MG tablet TAKE 1 TABLET BY MOUTH AT  BEDTIME    memantine (NAMENDA) 10 MG tablet TAKE 1 TABLET BY MOUTH TWICE  DAILY    Multiple Vitamin (MULTIVITAMIN WITH MINERALS) TABS tablet Take 1 tablet by mouth daily. SENIOR MULTIVITAMIN    Vibegron (GEMTESA) 75 MG TABS Take 1 tablet by mouth daily.    warfarin (COUMADIN) 5 MG tablet TAKE 1 TABLET BY MOUTH DAILY EXCEPT TAKE 1/2 TABLET BY ON MONDAYS, WEDNESDAY AND SATURDAY OR AS DIRECTED  BY  ANTICOAGULATION CLINIC    No facility-administered encounter medications on file as of 09/14/2022.  Fill History:   Dispensed Days Supply Quantity Provider Pharmacy  ATENOLOL  25 MG TABS 07/12/2022 100 100 tablet      Dispensed Days Supply Quantity Provider Pharmacy  BENZONATATE 200 MG CAPSULE 11/26/2020 15 60 each      Dispensed Days Supply Quantity Provider Pharmacy  DONEPEZIL HCL  10 MG TABS 06/20/2022 100 100 tablet      Dispensed Days Supply Quantity Provider Pharmacy  MEMANTINE HYDROCHLORIDE  10 MG TABS 06/29/2022 100 200 tablet      Dispensed Days Supply Quantity Provider Pharmacy  WARFARIN SODIUM  5 MG TABS 07/12/2022 100 129 tablet       Recent Office Vitals: BP Readings from Last 3 Encounters:  08/20/22 (!) 142/80  02/09/22 118/60  11/06/21 137/78   Pulse Readings from Last 3 Encounters:  08/20/22 (!) 59  02/09/22 71  11/06/21 60    Wt Readings from Last 3 Encounters:  08/20/22 166 lb (75.3 kg)  02/09/22 162 lb 2 oz (73.5 kg)  11/06/21 163 lb (73.9 kg)     Kidney Function Lab Results  Component Value Date/Time   CREATININE 1.20 08/20/2022 10:02 AM   CREATININE 1.17 02/09/2022 10:47 AM   CREATININE 1.20 (H) 06/24/2020 11:04 AM   GFR 56.52 (L) 08/20/2022 10:02 AM   GFRNONAA >60 11/05/2020  12:06 PM   GFRAA >60 10/16/2016 05:22 AM       Latest Ref Rng & Units 08/20/2022   10:02 AM 02/09/2022   10:47 AM 08/11/2021    2:06 PM  BMP  Glucose 70 - 99 mg/dL 112  167  104   BUN 6 - 23 mg/dL '17  20  18   '$ Creatinine 0.40 - 1.50 mg/dL 1.20  1.17  1.06   Sodium 135 - 145 mEq/L 142  140  141   Potassium 3.5 - 5.1 mEq/L 5.1  4.5  4.6   Chloride 96 - 112 mEq/L 105  103  104   CO2 19 - 32 mEq/L '31  30  30   '$ Calcium 8.4 - 10.5 mg/dL 10.3  10.3  10.3      Current antihypertensive regimen:  Atenolol 25 mg daily   Patient verbally confirms he is taking the above medications as directed. Yes  How often are you checking your Blood Pressure? Patient is checking  about once or twice per week  he checks his blood pressure at various times  Current home BP readings: Patients reading today was 143/74, he doesn't have a current log however, it is always between 140/70 and 145/80, he states it is never higher than 145/80.  Wrist or arm cuff: Arm  OTC medications including pseudoephedrine or NSAIDs? Patients wife denies.   Any readings above 180/120? No  What recent interventions/DTPs have been made by any provider to improve Blood Pressure control since last CPP Visit: No recent interventions  Any recent hospitalizations or ED visits since last visit with CPP? No recent hospital visits.   What diet changes have been made to improve Blood Pressure Control?  Patient follows a low sugar and low sodium diet Breakfast - patient will have oatmeal or peanut butter toast Lunch/Dinner - patient will have a meal with a meat, vegetable, starch and fruit.  Caffeine intake: Salt intake:  What exercise is being done to improve your Blood Pressure Control?  Patient will golf 3 days    Adherence Review: Is the patient currently on ACE/ARB medication? No Does the patient have >5 day gap between last estimated fill dates? No  Care Gaps: AWV - scheduled 10/11/2022 Last BP - 142/80 on 08/20/2022 Last A1C - 6.2 on 08/20/2022 Urine ACR - overdue Eye exam - overdue Covid - postponed  Star Rating Drugs: None  [facility] Pharmacist Assistant [phone]

## 2022-09-15 ENCOUNTER — Other Ambulatory Visit: Payer: Self-pay | Admitting: Family Medicine

## 2022-09-15 DIAGNOSIS — Z7901 Long term (current) use of anticoagulants: Secondary | ICD-10-CM

## 2022-09-16 ENCOUNTER — Telehealth: Payer: Self-pay

## 2022-09-16 ENCOUNTER — Telehealth: Payer: Medicare Other

## 2022-09-16 NOTE — Telephone Encounter (Signed)
Pt compliant with warfarin management and PCP apts. Sent in refill.

## 2022-09-16 NOTE — Telephone Encounter (Signed)
   CCM RN Visit Note   09/16/22 Name: CAEDAN SUMLER MRN: [id number]      DOB: 10-16-1940  Subjective: [name] is a 82 y.o. year old male who is a primary care patient of Laurey Morale, MD. The patient was referred to the Chronic Care Management team for assistance with care management needs subsequent to provider initiation of CCM services and plan of care.      An unsuccessful outreach attempt was made today for a scheduled CCM visit.   PLAN: A HIPAA compliant phone message was left for the patient providing contact information and requesting a return call.   Horris Latino RN Care Manager/Chronic Care Management [phone]

## 2022-09-24 ENCOUNTER — Telehealth: Payer: Self-pay

## 2022-09-24 NOTE — Progress Notes (Signed)
  Chronic Care Management   Note  09/24/2022 Name: Tanner Hensley MRN: [id number] DOB: 05/12/1941  Tanner Hensley is a 82 y.o. year old male who is a primary care patient of Laurey Morale, MD. I reached out to [name] by phone today in response to a referral sent by Tanner Hensley's PCP.  The first contact attempt was unsuccessful.   Follow up plan: Additional outreach attempts will be made.  Noreene Larsson, [place], [place] [address] Direct Dial: [phone] [email or web address]

## 2022-10-01 ENCOUNTER — Telehealth: Payer: Self-pay | Admitting: Family Medicine

## 2022-10-01 NOTE — Telephone Encounter (Signed)
Patient's wife dropped off paperwork needed to be filled out. Charge form was completed and placed in folder for completion and wife was made aware Dr.Fry was out of the office today.       Please advise

## 2022-10-04 ENCOUNTER — Ambulatory Visit (INDEPENDENT_AMBULATORY_CARE_PROVIDER_SITE_OTHER): Payer: Medicare Other

## 2022-10-04 DIAGNOSIS — Z7901 Long term (current) use of anticoagulants: Secondary | ICD-10-CM

## 2022-10-04 LAB — POCT INR: INR: 3.2 — AB (ref 2.0–3.0)

## 2022-10-04 NOTE — Patient Instructions (Addendum)
Pre visit review using our clinic review tool, if applicable. No additional management support is needed unless otherwise documented below in the visit note.  Reduce dose tomorrow to take 1/2 tablet and then continue 1/2 tablet daily except take 1 tablet on Tuesday, Thursday and Friday. Recheck in 3 weeks.

## 2022-10-04 NOTE — Telephone Encounter (Signed)
Pt form was received and placed on Dr Sarajane Jews red folder

## 2022-10-04 NOTE — Progress Notes (Signed)
  Chronic Care Management Note  10/04/2022 Name: Tanner Hensley MRN: [id number] DOB: Nov 18, 1940  Tanner Hensley is a 82 y.o. year old male who is a primary care patient of Laurey Morale, MD and is actively engaged with the Chronic Care Management team. I reached out to [name] by phone today to assist with re-scheduling a follow up visit with the RN Case Manager  Follow up plan: Telephone appointment with care management team member scheduled for:10/15/2022  Noreene Larsson, [facility]  [place], [place] [address] Direct Dial: [phone] [email or web address]

## 2022-10-04 NOTE — Progress Notes (Signed)
Pt already took dose today. Reduce dose tomorrow to take 1/2 tablet and then continue 1/2 tablet daily except take 1 tablet on Tuesday, Thursday and Friday. Recheck in 3 weeks.

## 2022-10-06 NOTE — Telephone Encounter (Signed)
Pt forms have been completed and fax to the number provided on paperwork. Pt was notified

## 2022-10-11 ENCOUNTER — Ambulatory Visit (INDEPENDENT_AMBULATORY_CARE_PROVIDER_SITE_OTHER): Payer: Medicare Other

## 2022-10-11 ENCOUNTER — Encounter (INDEPENDENT_AMBULATORY_CARE_PROVIDER_SITE_OTHER): Payer: Medicare Other

## 2022-10-11 VITALS — Ht 65.0 in | Wt 160.0 lb

## 2022-10-11 DIAGNOSIS — Z0000 Encounter for general adult medical examination without abnormal findings: Secondary | ICD-10-CM

## 2022-10-11 NOTE — Progress Notes (Addendum)
Subjective:   Tanner Hensley is a 82 y.o. male who presents for Medicare Annual/Subsequent preventive examination.  Review of Systems    Virtual Visit via Telephone Note  I connected with  Tanner Hensley on 10/22/22 at  1:30 PM EST by telephone and verified that I am speaking with the correct person using two identifiers.  Location: Patient: Home Provider: Office Persons participating in the virtual visit: patient/Nurse Health Advisor   I discussed the limitations, risks, security and privacy concerns of performing an evaluation and management service by telephone and the availability of in person appointments. The patient expressed understanding and agreed to proceed.  Interactive audio and video telecommunications were attempted between this nurse and patient, however failed, due to patient having technical difficulties OR patient did not have access to video capability.  We continued and completed visit with audio only.  Some vital signs may be absent or patient reported.   Criselda Peaches, LPN  Cardiac Risk Factors include: advanced age (>58mn, >>67women);male gender;hypertension     Objective:    Today's Vitals   10/11/22 1400  Weight: 160 lb (72.6 kg)  Height: 5' 5"$  (1.651 m)   Body mass index is 26.63 kg/m.     10/11/2022    2:10 PM 07/14/2022    9:37 AM 10/07/2021   11:01 AM 11/05/2020   12:08 PM 10/25/2020    6:00 PM 10/25/2020   10:38 AM 09/24/2020    1:14 PM  Advanced Directives  Does Patient Have a Medical Advance Directive? Yes Yes Yes No Yes Yes Yes  Type of AParamedicof AFairchildLiving will Living will HWilsonLiving will  HHenningLiving will HTomalesLiving will HBrackenLiving will  Does patient want to make changes to medical advance directive?  No - Patient declined No - Patient declined No - Patient declined No - Patient declined    Copy of  HFolkstonin Chart? No - copy requested  No - copy requested  No - copy requested  No - copy requested  Would patient like information on creating a medical advance directive?    No - Patient declined       Current Medications (verified) Outpatient Encounter Medications as of 10/11/2022  Medication Sig   acetaminophen (TYLENOL) 500 MG tablet Take 1,000 mg by mouth every 4 (four) hours as needed for mild pain.   ascorbic acid (VITAMIN C) 500 MG tablet Take 500 mg by mouth daily.   aspirin EC 81 MG tablet Take 81 mg by mouth daily.   atenolol (TENORMIN) 25 MG tablet Take 25 mg by mouth daily.   b complex vitamins tablet Take 1 tablet by mouth daily.   benzonatate (TESSALON) 200 MG capsule Take 1 capsule (200 mg total) by mouth every 6 (six) hours as needed for cough.   Biotin 5000 MCG CAPS Take 5,000 mcg by mouth daily.    CALCIUM CITRATE-VITAMIN D PO Take 1 tablet by mouth 2 (two) times daily. [phone]   Cholecalciferol (VITAMIN D3 PO) Take by mouth daily at 2 PM.   donepezil (ARICEPT) 10 MG tablet TAKE 1 TABLET BY MOUTH AT  BEDTIME   memantine (NAMENDA) 10 MG tablet TAKE 1 TABLET BY MOUTH TWICE  DAILY   Multiple Vitamin (MULTIVITAMIN WITH MINERALS) TABS tablet Take 1 tablet by mouth daily. SENIOR MULTIVITAMIN   Vibegron (GEMTESA) 75 MG TABS Take 1 tablet by mouth daily.  warfarin (COUMADIN) 5 MG tablet TAKE 1/2 TABLET BY MOUTH DAILY EXCEPT TAKE 1 TABLET BY  MOUTH ON TUESDAYS, THURSDAYS AND FRIDAYS OR AS DIRECTED BY  ANTICOAGULATION CLINIC   No facility-administered encounter medications on file as of 10/11/2022.    Allergies (verified) Codeine, Hydrocodone-acetaminophen, Tramadol, Lipitor [atorvastatin], Robaxin [methocarbamol], and Zocor [simvastatin]   History: Past Medical History:  Diagnosis Date   Arthritis    sees Dr. Charlestine Night   Atrial fibrillation [facility])    (Dr. Phylliss Blakes at [facility] and Dr. Lovena Le at [facility]. Goes to [facility]mad[facility] Clinic polymyalgia  rheumatica (Dr. Charlestine Night)   CHF (congestive heart failure) ([place])    NOT SURE WHEN   Coronary artery disease    Diabetes mellitus type II    DIET CONTROLLED AND LOST 40 LBS   Dysrhythmia    chronic a=fib with cardiac ablations   GERD (gastroesophageal reflux disease)    Gout    High cholesterol    Hypertension    Insomnia    Memory loss    Neuromuscular disorder ([place])    POLY MYALGIA   RHEUMATICA   Polymyalgia rheumatica ([place])    sees Dr. Charlestine Night    Presence of permanent cardiac pacemaker    [employer]  08/2011   Urticaria    Past Surgical History:  Procedure Laterality Date   ATRIAL FIBRILLATION ABLATION  2006, 2008, 2013   at [facility]  by Dr. Kathrynn Ducking SURGERY     CARDIAC CATHETERIZATION     has had multiple, but last one 2017 (10/13/2016)   COLONOSCOPY W/ BIOPSIES AND POLYPECTOMY  04/05/2017   per Dr. Oretha Caprice, no polyps, no repeats needed    CORONARY ANGIOPLASTY WITH STENT PLACEMENT  ~ 2014   "he has 2 stents" (10/13/2016)   ESOPHAGOGASTRODUODENOSCOPY     INGUINAL HERNIA REPAIR Right 11/28/2009   per Dr. Donnie Mesa   KNEE ARTHROSCOPY Left 02/03/2010   per Dr. [place] Qua   POSTERIOR LUMBAR FUSION  10/14/2016   SPINE SURGERY  10/13/2016   per Dr. Lynann Bologna, L3-4 fusion   TONSILLECTOMY     Family History  Problem Relation Age of Onset   Coronary artery disease Other    Hypertension Other    Heart disease Other    Heart disease Mother    Heart attack Father    Heart disease Sister    Diabetes Sister    Hypertension Sister    Other Brother        brain tumor   Hypertension Brother    Heart disease Brother    Social History   Socioeconomic History   Marital status: Married    Spouse name: [name]   Number of children: 3   Years of education: 14   Highest education level: Not on file  Occupational History    Comment: retired  Tobacco Use   Smoking status: Never   Smokeless tobacco: Never  Vaping Use   Vaping Use: Never used   Substance and Sexual Activity   Alcohol use: No    Alcohol/week: 0.0 standard drinks of alcohol   Drug use: No   Sexual activity: Never  Other Topics Concern   Not on file  Social History Narrative   Lives with wife, at home   Caffeine- coffee, 2 cups, Dr Malachi Bonds 8 oz   Social Determinants of Health   Financial Resource Strain: Low Risk  (10/11/2022)   Overall Financial Resource Strain (CARDIA)    Difficulty of Paying Living Expenses: Not  hard at all  Food Insecurity: No Food Insecurity (10/11/2022)   Hunger Vital Sign    Worried About Running Out of Food in the Last Year: Never true    Ran Out of Food in the Last Year: Never true  Transportation Needs: No Transportation Needs (10/11/2022)   PRAPARE - Hydrologist (Medical): No    Lack of Transportation (Non-Medical): No  Physical Activity: Sufficiently Active (10/11/2022)   Exercise Vital Sign    Days of Exercise per Week: 2 days    Minutes of Exercise per Session: 150+ min  Stress: No Stress Concern Present (10/11/2022)   [place]    Feeling of Stress : Not at all  Social Connections: [place] (10/11/2022)   Social Connection and Isolation Panel [NHANES]    Frequency of Communication with Friends and Family: More than three times a week    Frequency of Social Gatherings with Friends and Family: More than three times a week    Attends Religious Services: More than 4 times per year    Active Member of [employer] or Organizations: Yes    Attends Music therapist: More than 4 times per year    Marital Status: Married    Tobacco Counseling Counseling given: Not Answered   Clinical Intake:  Pre-visit preparation completed: No  Pain : No/denies pain     BMI - recorded: 26.63 Nutritional Status: BMI 25 -29 Overweight Nutritional Risks: None Diabetes: No  How often do you need to have someone help you when you  read instructions, pamphlets, or other written materials from your doctor or pharmacy?: 1 - Never  Diabetic?  No  Interpreter Needed?: No  Information entered by :: Rolene Arbour LPN   Activities of Daily Living    10/11/2022    2:09 PM  In your present state of health, do you have any difficulty performing the following activities:  Hearing? 0  Vision? 0  Difficulty concentrating or making decisions? 0  Walking or climbing stairs? 0  Dressing or bathing? 0  Doing errands, shopping? 0  Preparing Food and eating ? N  Using the Toilet? N  In the past six months, have you accidently leaked urine? N  Do you have problems with loss of bowel control? N  Managing your Medications? N  Managing your Finances? N  Housekeeping or managing your Housekeeping? N    Patient Care Team: Laurey Morale, MD as PCP - General (Family Medicine) Ebbie Ridge, MD as Referring Physician (Cardiology) Viona Gilmore, [facility] (Inactive) as Pharmacist (Pharmacist)  Indicate any recent Medical Services you may have received from other than [facility] providers in the past year (date may be approximate).     Assessment:   This is a routine wellness examination for [place].  Hearing/Vision screen Hearing Screening - Comments:: Denies hearing difficulties   Vision Screening - Comments:: Wears reading glasses - up to date with routine eye exams with  Dr Laban Emperor  Dietary issues and exercise activities discussed: Current Exercise Habits: Home exercise routine, Type of exercise: walking, Time (Minutes): > 60, Frequency (Times/Week): 2, Weekly Exercise (Minutes/Week): 0, Intensity: Moderate, Exercise limited by: None identified   Goals Addressed               This Visit's Progress     Patient Stated (pt-stated)        Continue playing golf.       Depression Screen  10/11/2022    2:07 PM 08/20/2022    9:24 AM 02/09/2022   10:45 AM 10/07/2021   10:52 AM 09/15/2021   10:53 AM 08/11/2021    2:10 PM  08/11/2021    1:38 PM  PHQ 2/9 Scores  PHQ - 2 Score 0 0 1 0 0 0 0  PHQ- 9 Score 0 1 14 0 2 3 0    Fall Risk    10/11/2022    2:10 PM 08/20/2022    9:24 AM 02/09/2022   10:44 AM 10/07/2021   10:57 AM 09/15/2021   10:54 AM  Fall Risk   Falls in the past year? 0 0 0 0 0  Number falls in past yr: 0 0 0 0 0  Injury with Fall? 0 0 0 0 0  Risk for fall due to : No Fall Risks No Fall Risks No Fall Risks No Fall Risks No Fall Risks  Follow up Falls prevention discussed Falls evaluation completed Falls evaluation completed      FALL RISK PREVENTION PERTAINING TO THE HOME:  Any stairs in or around the home? Yes If so, are there any without handrails? No  Home free of loose throw rugs in walkways, pet beds, electrical cords, etc? Yes  Adequate lighting in your home to reduce risk of falls? Yes   ASSISTIVE DEVICES UTILIZED TO PREVENT FALLS:  Life alert? No  Use of a cane, walker or w/c? No  Grab bars in the bathroom? Yes  Shower chair or bench in shower? Yes  Elevated toilet seat or a handicapped toilet? Yes   TIMED UP AND GO:  Was the test performed? No . Audio Visit  Cognitive Function:    06/05/2018    8:19 AM  MMSE - Mini Mental State Exam  Orientation to time 3  Orientation to Place 4  Registration 3  Attention/ Calculation 1  Recall 2  Language- name 2 objects 2  Language- repeat 0  Language- follow 3 step command 3  Language- read & follow direction 1  Write a sentence 1  Copy design 1  Total score 21      11/14/2014    8:45 AM  Montreal Cognitive Assessment   Visuospatial/ Executive (0/5) 5  Naming (0/3) 3  Attention: Read list of digits (0/2) 2  Attention: Read list of letters (0/1) 1  Attention: Serial 7 subtraction starting at 100 (0/3) 3  Language: Repeat phrase (0/2) 2  Language : Fluency (0/1) 1  Abstraction (0/2) 2  Delayed Recall (0/5) 4  Orientation (0/6) 6  Total 29  Adjusted Score (based on education) 29      10/11/2022    2:11 PM 10/07/2021    10:59 AM 09/24/2020    1:18 PM  6CIT Screen  What Year? 0 points 0 points 0 points  What month? 0 points 0 points 0 points  What time? 0 points 0 points   Count back from 20 0 points 0 points 0 points  Months in reverse 0 points 0 points 0 points  Repeat phrase 0 points 0 points 0 points  Total Score 0 points 0 points     Immunizations Immunization History  Administered Date(s) Administered   Fluad Quad(high Dose 65+) 06/15/2019, 06/24/2020, 07/15/2021, 06/28/2022   Influenza Split 06/07/2011, 06/21/2012   Influenza Whole 07/07/2005, 06/09/2007, 06/07/2008, 06/18/2009, 07/13/2010   Influenza, High Dose Seasonal PF 06/15/2016, 06/23/2017, 07/24/2018   Influenza,inj,Quad PF,6+ Mos 07/26/2013, 05/20/2014, 04/28/2015   Influenza-Unspecified 06/07/2011, 06/21/2012, 07/26/2013, 05/20/2014, 04/28/2015,  06/15/2016, 06/23/2017, 07/24/2018   Pneumococcal Conjugate-13 08/09/2014   Pneumococcal Polysaccharide-23 07/20/2012   Pneumococcal-Unspecified 07/20/2012, 08/09/2014   Td 07/13/2010   Tdap 07/13/2010   Zoster Recombinat (Shingrix) 01/15/2022, 03/22/2022      Flu Vaccine status: Up to date  Pneumococcal vaccine status: Up to date  Covid-19 vaccine status: Declined, Education has been provided regarding the importance of this vaccine but patient still declined. Advised may receive this vaccine at local pharmacy or Health Dept.or vaccine clinic. Aware to provide a copy of the vaccination record if obtained from local pharmacy or Health Dept. Verbalized acceptance and understanding.  Qualifies for Shingles Vaccine? Yes   Zostavax completed Yes   Shingrix Completed?: Yes  Screening Tests Health Maintenance  Topic Date Due   OPHTHALMOLOGY EXAM  10/22/2022 (Originally 10/06/2021)   Diabetic kidney evaluation - Urine ACR  10/23/2022 (Originally 08/10/2015)   COVID-19 Vaccine (1) 09/06/2023 (Originally 02/21/1942)   HEMOGLOBIN A1C  02/19/2023   Diabetic kidney evaluation - eGFR  measurement  08/21/2023   FOOT EXAM  08/27/2023   Medicare Annual Wellness (AWV)  10/12/2023   Pneumonia Vaccine 51+ Years old  Completed   INFLUENZA VACCINE  Completed   Zoster Vaccines- Shingrix  Completed   HPV VACCINES  Aged Out   DTaP/Tdap/Td  Discontinued    Health Maintenance  There are no preventive care reminders to display for this patient.   Colorectal cancer screening: No longer required.   Lung Cancer Screening: (Low Dose CT Chest recommended if Age 61-80 years, 30 pack-year currently smoking OR have quit w/in 15years.) does not qualify.     Additional Screening:  Hepatitis C Screening: does not qualify; Completed    Vision Screening: Recommended annual ophthalmology exams for early detection of glaucoma and other disorders of the eye. Is the patient up to date with their annual eye exam?  Yes  Who is the provider or what is the name of the office in which the patient attends annual eye exams? Dr Laban Emperor If pt is not established with a provider, would they like to be referred to a provider to establish care? No .   Dental Screening: Recommended annual dental exams for proper oral hygiene  Community Resource Referral / Chronic Care Management:  CRR required this visit?  No   CCM required this visit?  No      Plan:     I have personally reviewed and noted the following in the patient's chart:   Medical and social history Use of alcohol, tobacco or illicit drugs  Current medications and supplements including opioid prescriptions. Patient is not currently taking opioid prescriptions. Functional ability and status Nutritional status Physical activity Advanced directives List of other physicians Hospitalizations, surgeries, and ER visits in previous 12 months Vitals Screenings to include cognitive, depression, and falls Referrals and appointments  In addition, I have reviewed and discussed with patient certain preventive protocols, quality metrics, and  best practice recommendations. A written personalized care plan for preventive services as well as general preventive health recommendations were provided to patient.     Criselda Peaches, LPN   [id number]   Nurse Notes: Patient due Diabetic kidney evaluation- Urine ACR

## 2022-10-11 NOTE — Patient Instructions (Addendum)
[name] , Thank you for taking time to come for your Medicare Wellness Visit. I appreciate your ongoing commitment to your health goals. Please review the following plan we discussed and let me know if I can assist you in the future.   These are the goals we discussed:  Goals       Patient Stated (pt-stated)      Continue playing golf.        This is a list of the screening recommended for you and due dates:  Health Maintenance  Topic Date Due   Eye exam for diabetics  10/22/2022*   Yearly kidney health urinalysis for diabetes  10/23/2022*   COVID-19 Vaccine (1) 09/06/2023*   Hemoglobin A1C  02/19/2023   Yearly kidney function blood test for diabetes  08/21/2023   Complete foot exam   08/27/2023   Medicare Annual Wellness Visit  10/12/2023   Pneumonia Vaccine  Completed   Flu Shot  Completed   Zoster (Shingles) Vaccine  Completed   HPV Vaccine  Aged Out   DTaP/Tdap/Td vaccine  Discontinued  *Topic was postponed. The date shown is not the original due date.    Advanced directives: Please bring a copy of your health care power of attorney and living will to the office to be added to your chart at your convenience.   Conditions/risks identified: None  Next appointment: Follow up in one year for your annual wellness visit.    Preventive Care 83 Years and Older, Male  Preventive care refers to lifestyle choices and visits with your health care provider that can promote health and wellness. What does preventive care include? A yearly physical exam. This is also called an annual well check. Dental exams once or twice a year. Routine eye exams. Ask your health care provider how often you should have your eyes checked. Personal lifestyle choices, including: Daily care of your teeth and gums. Regular physical activity. Eating a healthy diet. Avoiding tobacco and drug use. Limiting alcohol use. Practicing safe sex. Taking low doses of aspirin every day. Taking vitamin and  mineral supplements as recommended by your health care provider. What happens during an annual well check? The services and screenings done by your health care provider during your annual well check will depend on your age, overall health, lifestyle risk factors, and family history of disease. Counseling  Your health care provider may ask you questions about your: Alcohol use. Tobacco use. Drug use. Emotional well-being. Home and relationship well-being. Sexual activity. Eating habits. History of falls. Memory and ability to understand (cognition). Work and work Statistician. Screening  You may have the following tests or measurements: Height, weight, and BMI. Blood pressure. Lipid and cholesterol levels. These may be checked every 5 years, or more frequently if you are over 60 years old. Skin check. Lung cancer screening. You may have this screening every year starting at age 69 if you have a 30-pack-year history of smoking and currently smoke or have quit within the past 15 years. Fecal occult blood test (FOBT) of the stool. You may have this test every year starting at age 13. Flexible sigmoidoscopy or colonoscopy. You may have a sigmoidoscopy every 5 years or a colonoscopy every 10 years starting at age 77. Prostate cancer screening. Recommendations will vary depending on your family history and other risks. Hepatitis C blood test. Hepatitis B blood test. Sexually transmitted disease (STD) testing. Diabetes screening. This is done by checking your blood sugar (glucose) after you have not  eaten for a while (fasting). You may have this done every 1-3 years. Abdominal aortic aneurysm (AAA) screening. You may need this if you are a current or former smoker. Osteoporosis. You may be screened starting at age 47 if you are at high risk. Talk with your health care provider about your test results, treatment options, and if necessary, the need for more tests. Vaccines  Your health care  provider may recommend certain vaccines, such as: Influenza vaccine. This is recommended every year. Tetanus, diphtheria, and acellular pertussis (Tdap, Td) vaccine. You may need a Td booster every 10 years. Zoster vaccine. You may need this after age 83. Pneumococcal 13-valent conjugate (PCV13) vaccine. One dose is recommended after age 83. Pneumococcal polysaccharide (PPSV23) vaccine. One dose is recommended after age 83. Talk to your health care provider about which screenings and vaccines you need and how often you need them. This information is not intended to replace advice given to you by your health care provider. Make sure you discuss any questions you have with your health care provider. Document Released: 09/19/2015 Document Revised: 05/12/2016 Document Reviewed: 06/24/2015 Elsevier Interactive Patient Education  2017 [place] Prevention in the Home Falls can cause injuries. They can happen to people of all ages. There are many things you can do to make your home safe and to help prevent falls. What can I do on the outside of my home? Regularly fix the edges of walkways and driveways and fix any cracks. Remove anything that might make you trip as you walk through a door, such as a raised step or threshold. Trim any bushes or trees on the path to your home. Use bright outdoor lighting. Clear any walking paths of anything that might make someone trip, such as rocks or tools. Regularly check to see if handrails are loose or broken. Make sure that both sides of any steps have handrails. Any raised decks and porches should have guardrails on the edges. Have any leaves, snow, or ice cleared regularly. Use sand or salt on walking paths during winter. Clean up any spills in your garage right away. This includes oil or grease spills. What can I do in the bathroom? Use night lights. Install grab bars by the toilet and in the tub and shower. Do not use towel bars as grab  bars. Use non-skid mats or decals in the tub or shower. If you need to sit down in the shower, use a plastic, non-slip stool. Keep the floor dry. Clean up any water that spills on the floor as soon as it happens. Remove soap buildup in the tub or shower regularly. Attach bath mats securely with double-sided non-slip rug tape. Do not have throw rugs and other things on the floor that can make you trip. What can I do in the bedroom? Use night lights. Make sure that you have a light by your bed that is easy to reach. Do not use any sheets or blankets that are too big for your bed. They should not hang down onto the floor. Have a firm chair that has side arms. You can use this for support while you get dressed. Do not have throw rugs and other things on the floor that can make you trip. What can I do in the kitchen? Clean up any spills right away. Avoid walking on wet floors. Keep items that you use a lot in easy-to-reach places. If you need to reach something above you, use a strong step stool that  has a grab bar. Keep electrical cords out of the way. Do not use floor polish or wax that makes floors slippery. If you must use wax, use non-skid floor wax. Do not have throw rugs and other things on the floor that can make you trip. What can I do with my stairs? Do not leave any items on the stairs. Make sure that there are handrails on both sides of the stairs and use them. Fix handrails that are broken or loose. Make sure that handrails are as long as the stairways. Check any carpeting to make sure that it is firmly attached to the stairs. Fix any carpet that is loose or worn. Avoid having throw rugs at the top or bottom of the stairs. If you do have throw rugs, attach them to the floor with carpet tape. Make sure that you have a light switch at the top of the stairs and the bottom of the stairs. If you do not have them, ask someone to add them for you. What else can I do to help prevent  falls? Wear shoes that: Do not have high heels. Have rubber bottoms. Are comfortable and fit you well. Are closed at the toe. Do not wear sandals. If you use a stepladder: Make sure that it is fully opened. Do not climb a closed stepladder. Make sure that both sides of the stepladder are locked into place. Ask someone to hold it for you, if possible. Clearly mark and make sure that you can see: Any grab bars or handrails. First and last steps. Where the edge of each step is. Use tools that help you move around (mobility aids) if they are needed. These include: Canes. Walkers. Scooters. Crutches. Turn on the lights when you go into a dark area. Replace any light bulbs as soon as they burn out. Set up your furniture so you have a clear path. Avoid moving your furniture around. If any of your floors are uneven, fix them. If there are any pets around you, be aware of where they are. Review your medicines with your doctor. Some medicines can make you feel dizzy. This can increase your chance of falling. Ask your doctor what other things that you can do to help prevent falls. This information is not intended to replace advice given to you by your health care provider. Make sure you discuss any questions you have with your health care provider. Document Released: 06/19/2009 Document Revised: 01/29/2016 Document Reviewed: 09/27/2014 Elsevier Interactive Patient Education  2017 [employer].

## 2022-10-11 NOTE — Progress Notes (Signed)
Subjective:   Tanner Hensley is a 82 y.o. male who presents for Medicare Annual/Subsequent preventive examination.  Review of Systems           Objective:    There were no vitals filed for this visit. There is no height or weight on file to calculate BMI.     10/11/2022    2:10 PM 07/14/2022    9:37 AM 10/07/2021   11:01 AM 11/05/2020   12:08 PM 10/25/2020    6:00 PM 10/25/2020   10:38 AM 09/24/2020    1:14 PM  Advanced Directives  Does Patient Have a Medical Advance Directive? Yes Yes Yes No Yes Yes Yes  Type of Paramedic of [place];Living will Living will [place];Living will  [place];Living will [place];Living will [place];Living will  Does patient want to make changes to medical advance directive?  No - Patient declined No - Patient declined No - Patient declined No - Patient declined    Copy of [place] in Chart? No - copy requested  No - copy requested  No - copy requested  No - copy requested  Would patient like information on creating a medical advance directive?    No - Patient declined       Current Medications (verified) Outpatient Encounter Medications as of 10/11/2022  Medication Sig   acetaminophen (TYLENOL) 500 MG tablet Take 1,000 mg by mouth every 4 (four) hours as needed for mild pain.   ascorbic acid (VITAMIN C) 500 MG tablet Take 500 mg by mouth daily.   aspirin EC 81 MG tablet Take 81 mg by mouth daily.   atenolol (TENORMIN) 25 MG tablet Take 25 mg by mouth daily.   b complex vitamins tablet Take 1 tablet by mouth daily.   benzonatate (TESSALON) 200 MG capsule Take 1 capsule (200 mg total) by mouth every 6 (six) hours as needed for cough.   Biotin 5000 MCG CAPS Take 5,000 mcg by mouth daily.    CALCIUM CITRATE-VITAMIN D PO Take 1 tablet by mouth 2 (two) times daily. [phone]   Cholecalciferol (VITAMIN D3 PO) Take by mouth daily at  2 PM.   donepezil (ARICEPT) 10 MG tablet TAKE 1 TABLET BY MOUTH AT  BEDTIME   memantine (NAMENDA) 10 MG tablet TAKE 1 TABLET BY MOUTH TWICE  DAILY   Multiple Vitamin (MULTIVITAMIN WITH MINERALS) TABS tablet Take 1 tablet by mouth daily. SENIOR MULTIVITAMIN   Vibegron (GEMTESA) 75 MG TABS Take 1 tablet by mouth daily.   warfarin (COUMADIN) 5 MG tablet TAKE 1/2 TABLET BY MOUTH DAILY EXCEPT TAKE 1 TABLET BY  MOUTH ON TUESDAYS, THURSDAYS AND FRIDAYS OR AS DIRECTED BY  ANTICOAGULATION CLINIC   No facility-administered encounter medications on file as of 10/11/2022.    Allergies (verified) Codeine, Hydrocodone-acetaminophen, Tramadol, Lipitor [atorvastatin], Robaxin [methocarbamol], and Zocor [simvastatin]   History: Past Medical History:  Diagnosis Date   Arthritis    sees Dr. Charlestine Night   Atrial fibrillation [facility])    (Dr. Phylliss Blakes at [facility] and Dr. Lovena Le at [facility]. Goes to [facility] polymyalgia rheumatica (Dr. Charlestine Night)   CHF (congestive heart failure) ([place])    NOT SURE WHEN   Coronary artery disease    Diabetes mellitus type II    DIET CONTROLLED AND LOST 40 LBS   Dysrhythmia    chronic a=fib with cardiac ablations   GERD (gastroesophageal reflux disease)  Gout    High cholesterol    Hypertension    Insomnia    Memory loss    Neuromuscular disorder ([place])    POLY MYALGIA   RHEUMATICA   Polymyalgia rheumatica (HCC)    sees Dr. Charlestine Night    Presence of permanent cardiac pacemaker    [employer]  08/2011   Urticaria    Past Surgical History:  Procedure Laterality Date   ATRIAL FIBRILLATION ABLATION  2006, 2008, 2013   at [facility]  by Dr. Kathrynn Ducking SURGERY     CARDIAC CATHETERIZATION     has had multiple, but last one 2017 (10/13/2016)   COLONOSCOPY W/ BIOPSIES AND POLYPECTOMY  04/05/2017   per Dr. Oretha Caprice, no polyps, no repeats needed    CORONARY ANGIOPLASTY WITH STENT PLACEMENT  ~ 2014   "he has 2 stents" (10/13/2016)    ESOPHAGOGASTRODUODENOSCOPY     INGUINAL HERNIA REPAIR Right 11/28/2009   per Dr. Donnie Mesa   KNEE ARTHROSCOPY Left 02/03/2010   per Dr. Lindwood Qua   POSTERIOR LUMBAR FUSION  10/14/2016   SPINE SURGERY  10/13/2016   per Dr. Lynann Bologna, L3-4 fusion   TONSILLECTOMY     Family History  Problem Relation Age of Onset   Coronary artery disease Other    Hypertension Other    Heart disease Other    Heart disease Mother    Heart attack Father    Heart disease Sister    Diabetes Sister    Hypertension Sister    Other Brother        brain tumor   Hypertension Brother    Heart disease Brother    Social History   Socioeconomic History   Marital status: Married    Spouse name: [name]   Number of children: 3   Years of education: 14   Highest education level: Not on file  Occupational History    Comment: retired  Tobacco Use   Smoking status: Never   Smokeless tobacco: Never  Vaping Use   Vaping Use: Never used  Substance and Sexual Activity   Alcohol use: No    Alcohol/week: 0.0 standard drinks of alcohol   Drug use: No   Sexual activity: Never  Other Topics Concern   Not on file  Social History Narrative   Lives with wife, at home   Caffeine- coffee, 2 cups, Dr Malachi Bonds 8 oz   Social Determinants of Health   Financial Resource Strain: Low Risk  (10/11/2022)   Overall Financial Resource Strain (CARDIA)    Difficulty of Paying Living Expenses: Not hard at all  Food Insecurity: No Food Insecurity (10/11/2022)   Hunger Vital Sign    Worried About Running Out of Food in the Last Year: Never true    [place] in the Last Year: Never true  Transportation Needs: No Transportation Needs (10/11/2022)   PRAPARE - Hydrologist (Medical): No    Lack of Transportation (Non-Medical): No  Physical Activity: Sufficiently Active (10/11/2022)   Exercise Vital Sign    Days of Exercise per Week: 2 days    Minutes of Exercise per Session: 150+ min  Stress:  No Stress Concern Present (10/11/2022)   [place]    Feeling of Stress : Not at all  Social Connections: [place] (10/11/2022)   Social Connection and Isolation Panel [NHANES]    Frequency of Communication with Friends and Family:  More than three times a week    Frequency of Social Gatherings with Friends and Family: More than three times a week    Attends Religious Services: More than 4 times per year    Active Member of [employer] or Organizations: Yes    Attends Music therapist: More than 4 times per year    Marital Status: Married    Tobacco Counseling Counseling given: Not Answered   Clinical Intake:                 Diabetic?         Activities of Daily Living    10/11/2022    2:09 PM  In your present state of health, do you have any difficulty performing the following activities:  Hearing? 0  Vision? 0  Difficulty concentrating or making decisions? 0  Walking or climbing stairs? 0  Dressing or bathing? 0  Doing errands, shopping? 0  Preparing Food and eating ? N  Using the Toilet? N  In the past six months, have you accidently leaked urine? N  Do you have problems with loss of bowel control? N  Managing your Medications? N  Managing your Finances? N  Housekeeping or managing your Housekeeping? N    Patient Care Team: Laurey Morale, MD as PCP - General (Family Medicine) Ebbie Ridge, MD as Referring Physician (Cardiology) Viona Gilmore, [facility] (Inactive) as Pharmacist (Pharmacist)  Indicate any recent Medical Services you may have received from other than [facility] providers in the past year (date may be approximate).     Assessment:   This is a routine wellness examination for [place].  Hearing/Vision screen No results found.  Dietary issues and exercise activities discussed:     Goals Addressed   None    Depression Screen    10/11/2022    2:07 PM  08/20/2022    9:24 AM 02/09/2022   10:45 AM 10/07/2021   10:52 AM 09/15/2021   10:53 AM 08/11/2021    2:10 PM 08/11/2021    1:38 PM  PHQ 2/9 Scores  PHQ - 2 Score 0 0 1 0 0 0 0  PHQ- 9 Score 0 1 14 0 2 3 0    Fall Risk    10/11/2022    2:10 PM 08/20/2022    9:24 AM 02/09/2022   10:44 AM 10/07/2021   10:57 AM 09/15/2021   10:54 AM  Fall Risk   Falls in the past year? 0 0 0 0 0  Number falls in past yr: 0 0 0 0 0  Injury with Fall? 0 0 0 0 0  Risk for fall due to : No Fall Risks No Fall Risks No Fall Risks No Fall Risks No Fall Risks  Follow up Falls prevention discussed Falls evaluation completed Falls evaluation completed      FALL RISK PREVENTION PERTAINING TO THE HOME:  Any stairs in or around the home?  If so, are there any without handrails?  Home free of loose throw rugs in walkways, pet beds, electrical cords, etc?  Adequate lighting in your home to reduce risk of falls?   ASSISTIVE DEVICES UTILIZED TO PREVENT FALLS:  Life alert?  Use of a cane, walker or w/c?  Grab bars in the bathroom?  Shower chair or bench in shower?  Elevated toilet seat or a handicapped toilet?   TIMED UP AND GO:  Was the test performed? .  Length of time to ambulate 10 feet:  sec.  Cognitive Function:    06/05/2018    8:19 AM  MMSE - Mini Mental State Exam  Orientation to time 3  Orientation to Place 4  Registration 3  Attention/ Calculation 1  Recall 2  Language- name 2 objects 2  Language- repeat 0  Language- follow 3 step command 3  Language- read & follow direction 1  Write a sentence 1  Copy design 1  Total score 21      11/14/2014    8:45 AM  Montreal Cognitive Assessment   Visuospatial/ Executive (0/5) 5  Naming (0/3) 3  Attention: Read list of digits (0/2) 2  Attention: Read list of letters (0/1) 1  Attention: Serial 7 subtraction starting at 100 (0/3) 3  Language: Repeat phrase (0/2) 2  Language : Fluency (0/1) 1  Abstraction (0/2) 2  Delayed Recall (0/5) 4   Orientation (0/6) 6  Total 29  Adjusted Score (based on education) 29      10/11/2022    2:11 PM 10/07/2021   10:59 AM 09/24/2020    1:18 PM  6CIT Screen  What Year? 0 points 0 points 0 points  What month? 0 points 0 points 0 points  What time? 0 points 0 points   Count back from 20 0 points 0 points 0 points  Months in reverse 0 points 0 points 0 points  Repeat phrase 0 points 0 points 0 points  Total Score 0 points 0 points     Immunizations Immunization History  Administered Date(s) Administered   Fluad Quad(high Dose 65+) 06/15/2019, 06/24/2020, 07/15/2021, 06/28/2022   Influenza Split 06/07/2011, 06/21/2012   Influenza Whole 07/07/2005, 06/09/2007, 06/07/2008, 06/18/2009, 07/13/2010   Influenza, High Dose Seasonal PF 06/15/2016, 06/23/2017, 07/24/2018   Influenza,inj,Quad PF,6+ Mos 07/26/2013, 05/20/2014, 04/28/2015   Influenza-Unspecified 06/07/2011, 06/21/2012, 07/26/2013, 05/20/2014, 04/28/2015, 06/15/2016, 06/23/2017, 07/24/2018   Pneumococcal Conjugate-13 08/09/2014   Pneumococcal Polysaccharide-23 07/20/2012   Pneumococcal-Unspecified 07/20/2012, 08/09/2014   Td 07/13/2010   Tdap 07/13/2010   Zoster Recombinat (Shingrix) 01/15/2022, 03/22/2022            Qualifies for Shingles Vaccine?   Zostavax completed     Screening Tests Health Maintenance  Topic Date Due   OPHTHALMOLOGY EXAM  10/22/2022 (Originally 10/06/2021)   Diabetic kidney evaluation - Urine ACR  10/23/2022 (Originally 08/10/2015)   COVID-19 Vaccine (1) 09/06/2023 (Originally 02/21/1942)   HEMOGLOBIN A1C  02/19/2023   Diabetic kidney evaluation - eGFR measurement  08/21/2023   FOOT EXAM  08/27/2023   Medicare Annual Wellness (AWV)  10/12/2023   Pneumonia Vaccine 4+ Years old  Completed   INFLUENZA VACCINE  Completed   Zoster Vaccines- Shingrix  Completed   HPV VACCINES  Aged Out   DTaP/Tdap/Td  Discontinued    Health Maintenance  There are no preventive care reminders to display for  this patient.     Lung Cancer Screening: (Low Dose CT Chest recommended if Age 23-80 years, 30 pack-year currently smoking OR have quit w/in 15years.)  qualify.   Lung Cancer Screening Referral:   Additional Screening:  Hepatitis C Screening:  qualify; Completed   Vision Screening: Recommended annual ophthalmology exams for early detection of glaucoma and other disorders of the eye. Is the patient up to date with their annual eye exam?   Who is the provider or what is the name of the office in which the patient attends annual eye exams?  If pt is not established with a provider, would they like to be referred to a provider  to establish care?    Dental Screening: Recommended annual dental exams for proper oral hygiene  Community Resource Referral / Chronic Care Management: CRR required this visit?    CCM required this visit?       Plan:     I have personally reviewed and noted the following in the patient's chart:   Medical and social history Use of alcohol, tobacco or illicit drugs  Current medications and supplements including opioid prescriptions.  Functional ability and status Nutritional status Physical activity Advanced directives List of other physicians Hospitalizations, surgeries, and ER visits in previous 12 months Vitals Screenings to include cognitive, depression, and falls Referrals and appointments  In addition, I have reviewed and discussed with patient certain preventive protocols, quality metrics, and best practice recommendations. A written personalized care plan for preventive services as well as general preventive health recommendations were provided to patient.     Criselda Peaches, LPN   [id number]   Nurse Notes:     This encounter was created in error - please disregard.

## 2022-10-15 ENCOUNTER — Telehealth: Payer: Medicare Other

## 2022-10-20 DIAGNOSIS — M0609 Rheumatoid arthritis without rheumatoid factor, multiple sites: Secondary | ICD-10-CM | POA: Diagnosis not present

## 2022-10-20 DIAGNOSIS — M79641 Pain in right hand: Secondary | ICD-10-CM | POA: Diagnosis not present

## 2022-10-20 DIAGNOSIS — M1991 Primary osteoarthritis, unspecified site: Secondary | ICD-10-CM | POA: Diagnosis not present

## 2022-10-20 DIAGNOSIS — M353 Polymyalgia rheumatica: Secondary | ICD-10-CM | POA: Diagnosis not present

## 2022-10-20 DIAGNOSIS — M79642 Pain in left hand: Secondary | ICD-10-CM | POA: Diagnosis not present

## 2022-10-22 ENCOUNTER — Telehealth: Payer: Self-pay

## 2022-10-22 NOTE — Telephone Encounter (Signed)
Pt LVM reporting he cannot make apt on 2/21 at 3:30 for coumadin clinic and would like to change it sometime between 11 and 2:30 of the same day.  Tried to contact pt but had to leave a VM. Advised could change the apt to 12:30 if needed on 2/21.

## 2022-10-27 ENCOUNTER — Ambulatory Visit (INDEPENDENT_AMBULATORY_CARE_PROVIDER_SITE_OTHER): Payer: Medicare Other

## 2022-10-27 DIAGNOSIS — Z7901 Long term (current) use of anticoagulants: Secondary | ICD-10-CM

## 2022-10-27 LAB — POCT INR: INR: 1.9 — AB (ref 2.0–3.0)

## 2022-10-27 NOTE — Patient Instructions (Addendum)
Pre visit review using our clinic review tool, if applicable. No additional management support is needed unless otherwise documented below in the visit note.  Increase dose today to take 1 tablet and then continue 1/2 tablet daily except take 1 tablet on Tuesday, Thursday and Friday. Recheck in 3 weeks.

## 2022-10-27 NOTE — Progress Notes (Signed)
Increase dose today to take 1 tablet and then continue 1/2 tablet daily except take 1 tablet on Tuesday, Thursday and Friday. Recheck in 3 weeks.

## 2022-11-01 ENCOUNTER — Other Ambulatory Visit: Payer: Medicare Other

## 2022-11-03 ENCOUNTER — Telehealth: Payer: Self-pay

## 2022-11-03 DIAGNOSIS — Z45018 Encounter for adjustment and management of other part of cardiac pacemaker: Secondary | ICD-10-CM | POA: Diagnosis not present

## 2022-11-03 DIAGNOSIS — I4821 Permanent atrial fibrillation: Secondary | ICD-10-CM | POA: Diagnosis not present

## 2022-11-03 NOTE — Progress Notes (Signed)
  Chronic Care Management Note  11/03/2022 Name: CLEASON FEICK MRN: [id number] DOB: 22-May-1941  FADIL KEYSOR is a 82 y.o. year old male who is a primary care patient of Laurey Morale, MD and is actively engaged with the Chronic Care Management team. I reached out to [name] by phone today to assist with re-scheduling an initial visit with the RN Case Manager  Follow up plan: Patient declines further follow up and engagement by the care management team. Appropriate care team members and provider have been notified via electronic communication.   Noreene Larsson, [place], [place] [address] Direct Dial: [phone] [email or web address]

## 2022-11-11 ENCOUNTER — Ambulatory Visit: Payer: Self-pay

## 2022-11-11 NOTE — Chronic Care Management (AMB) (Signed)
   11/11/2022  DOMINYCK CALIFF [birth year] [id number]   Message received from [place].  Patient declined Chronic Care Management services. The Chronic Care Management team will gladly assist if patient opts to participate at a later time.  CCM enrollment updated to reflect current status.   Horris Latino RN Care Manager/Chronic Care Management [phone]

## 2022-11-15 ENCOUNTER — Ambulatory Visit (INDEPENDENT_AMBULATORY_CARE_PROVIDER_SITE_OTHER): Payer: Medicare Other

## 2022-11-15 DIAGNOSIS — Z7901 Long term (current) use of anticoagulants: Secondary | ICD-10-CM

## 2022-11-15 LAB — POCT INR: INR: 1.7 — AB (ref 2.0–3.0)

## 2022-11-15 NOTE — Patient Instructions (Addendum)
Pre visit review using our clinic review tool, if applicable. No additional management support is needed unless otherwise documented below in the visit note.  Increase dose today to take 1 tablet and then change weekly dose to take 1 tablet daily except take 1/2 tablet on Mondays, Wednesdays, and Fridays.  Recheck in 2 weeks.

## 2022-11-15 NOTE — Progress Notes (Signed)
Increase dose today to take 1 tablet and then change weekly dose to take 1 tablet daily except take 1/2 tablet on Mondays, Wednesdays, and Fridays.  Recheck in 2 weeks.

## 2022-11-29 ENCOUNTER — Ambulatory Visit (INDEPENDENT_AMBULATORY_CARE_PROVIDER_SITE_OTHER): Payer: Medicare Other

## 2022-11-29 DIAGNOSIS — Z7901 Long term (current) use of anticoagulants: Secondary | ICD-10-CM

## 2022-11-29 LAB — POCT INR: INR: 2.6 (ref 2.0–3.0)

## 2022-11-29 NOTE — Progress Notes (Signed)
Continue 1 tablet daily except take 1/2 tablet on Mondays, Wednesdays, and Fridays. Recheck in 6 weeks.  

## 2022-11-29 NOTE — Patient Instructions (Addendum)
Pre visit review using our clinic review tool, if applicable. No additional management support is needed unless otherwise documented below in the visit note.  Continue 1 tablet daily except take 1/2 tablet on Mondays, Wednesdays, and Fridays. Recheck in 6 weeks.  

## 2022-12-02 ENCOUNTER — Ambulatory Visit: Payer: Medicare Other | Admitting: Podiatry

## 2022-12-02 DIAGNOSIS — M79675 Pain in left toe(s): Secondary | ICD-10-CM

## 2022-12-02 DIAGNOSIS — M79674 Pain in right toe(s): Secondary | ICD-10-CM

## 2022-12-02 DIAGNOSIS — B351 Tinea unguium: Secondary | ICD-10-CM

## 2022-12-02 DIAGNOSIS — Z7901 Long term (current) use of anticoagulants: Secondary | ICD-10-CM

## 2022-12-02 DIAGNOSIS — L84 Corns and callosities: Secondary | ICD-10-CM

## 2022-12-05 NOTE — Progress Notes (Signed)
Subjective: Chief Complaint  Patient presents with   routine foot care     82 y.o. returns the office today for painful, elongated, thickened toenails which he cannot trim himself.  Denies any open sores.  No swelling redness or any drainage.  He has no new concerns today.    Currently on warfarin  PCP: Laurey Morale, MD Last seen August 20, 2022 Last A1c was 6.2 on August 20, 2022  Objective: AAO 3, NAD DP/PT pulses palpable, CRT less than 3 seconds Nails hypertrophic, dystrophic, elongated, brittle, discolored 10. There is tenderness overlying the nails 1-5 bilaterally. There is no surrounding erythema or drainage along the nail sites.  Incurvation of nail border. Hyperkeratotic lesion the distal aspect the left second toe from a hammertoe deformity.  There is no underlying ulceration identified today. No open lesions are noted. No pain with calf compression, swelling, warmth, erythema.  Assessment: Patient presents with symptomatic onychomycosis, hyperkeratotic lesion due to hammertoe  Plan: -Treatment options including alternatives, risks, complications were discussed -Nails sharply debrided 10 without complication/bleeding.  -Hyperkeratotic lesion sharply debrided x1 without any complications or bleeding. Offloading  -Discussed daily foot inspection. If there are any changes, to call the office immediately.  -Follow-up in 3 months or sooner if any problems are to arise. In the meantime, encouraged to call the office with any questions, concerns, changes symptoms.  Return in about 9 weeks (around 02/03/2023).   Celesta Gentile, DPM

## 2022-12-20 ENCOUNTER — Telehealth: Payer: Self-pay | Admitting: Family Medicine

## 2022-12-20 NOTE — Telephone Encounter (Signed)
Pt wife is calling and only md needs to do is fax rx gemtesa 75 mg #90 with refills to [facility] fax number [phone]

## 2022-12-21 MED ORDER — GEMTESA 75 MG PO TABS: 1.0000 | ORAL_TABLET | Freq: Every day | ORAL | 3 refills | Status: DC

## 2022-12-21 NOTE — Telephone Encounter (Signed)
Ok to send this Rx as requested by pt, please advise

## 2022-12-21 NOTE — Telephone Encounter (Signed)
The RX is ready to fax  

## 2022-12-23 ENCOUNTER — Other Ambulatory Visit: Payer: Self-pay

## 2022-12-24 NOTE — Telephone Encounter (Signed)
Pt Rx was faxed to the number provided by [facility]

## 2023-01-10 ENCOUNTER — Ambulatory Visit (INDEPENDENT_AMBULATORY_CARE_PROVIDER_SITE_OTHER): Payer: Medicare Other

## 2023-01-10 DIAGNOSIS — Z7901 Long term (current) use of anticoagulants: Secondary | ICD-10-CM

## 2023-01-10 LAB — POCT INR: INR: 2.1 (ref 2.0–3.0)

## 2023-01-10 NOTE — Patient Instructions (Addendum)
Pre visit review using our clinic review tool, if applicable. No additional management support is needed unless otherwise documented below in the visit note.  Continue 1 tablet daily except take 1/2 tablet on Mondays, Wednesdays, and Fridays. Recheck in 6 weeks.  

## 2023-01-10 NOTE — Progress Notes (Signed)
Continue 1 tablet daily except take 1/2 tablet on Mondays, Wednesdays, and Fridays. Recheck in 6 weeks.  

## 2023-01-25 DIAGNOSIS — D3132 Benign neoplasm of left choroid: Secondary | ICD-10-CM | POA: Diagnosis not present

## 2023-02-03 ENCOUNTER — Ambulatory Visit: Payer: Medicare Other | Admitting: Podiatry

## 2023-02-03 DIAGNOSIS — Z7901 Long term (current) use of anticoagulants: Secondary | ICD-10-CM

## 2023-02-03 DIAGNOSIS — B351 Tinea unguium: Secondary | ICD-10-CM

## 2023-02-03 DIAGNOSIS — M79674 Pain in right toe(s): Secondary | ICD-10-CM | POA: Diagnosis not present

## 2023-02-03 DIAGNOSIS — M79675 Pain in left toe(s): Secondary | ICD-10-CM

## 2023-02-03 NOTE — Progress Notes (Signed)
Subjective: Chief Complaint  Patient presents with   Nail Problem    Nail trim     82 y.o. returns the office today for painful, elongated, thickened toenails which he cannot trim himself.  He did trim them some yesterday as they were long. Denies any open sores.  No swelling redness or any drainage.  He has no new concerns today.    Currently on warfarin  PCP: Nelwyn Salisbury, MD Last seen August 20, 2022 Last A1c was 6.2 on August 20, 2022  Objective: AAO 3, NAD DP/PT pulses palpable, CRT less than 3 seconds Nail are mildly hypertrophic, dystrophic, elongated, brittle, discolored 10. There is tenderness overlying the nails 1-5 bilaterally. There is no surrounding erythema or drainage along the nail sites.  Incurvation of nail border. No significant hyperkeratotic lesion the distal aspect the left second toe from a hammertoe deformity.  There is no underlying ulceration identified today. No open lesions are noted. No pain with calf compression, swelling, warmth, erythema.  Assessment: Patient presents with symptomatic onychomycosis, hyperkeratotic lesion due to hammertoe  Plan: -Treatment options including alternatives, risks, complications were discussed -Nails sharply debrided 10 without complication/bleeding.  -Discussed daily foot inspection. If there are any changes, to call the office immediately.  -Follow-up in 3 months or sooner if any problems are to arise. In the meantime, encouraged to call the office with any questions, concerns, changes symptoms.  Return in about 3 months (around 05/06/2023) for nail trim .  Ovid Curd, DPM

## 2023-02-23 ENCOUNTER — Ambulatory Visit (INDEPENDENT_AMBULATORY_CARE_PROVIDER_SITE_OTHER): Payer: Medicare Other

## 2023-02-23 DIAGNOSIS — Z7901 Long term (current) use of anticoagulants: Secondary | ICD-10-CM | POA: Diagnosis not present

## 2023-02-23 LAB — POCT INR: INR: 2.4 (ref 2.0–3.0)

## 2023-02-23 NOTE — Patient Instructions (Addendum)
Pre visit review using our clinic review tool, if applicable. No additional management support is needed unless otherwise documented below in the visit note.  Continue 1 tablet daily except take 1/2 tablet on Mondays, Wednesdays, and Fridays. Recheck in 6 weeks.  

## 2023-02-23 NOTE — Progress Notes (Signed)
Continue 1 tablet daily except take 1/2 tablet on Mondays, Wednesdays, and Fridays. Recheck in 6 weeks.  

## 2023-02-28 ENCOUNTER — Other Ambulatory Visit: Payer: Self-pay | Admitting: Family Medicine

## 2023-02-28 DIAGNOSIS — Z7901 Long term (current) use of anticoagulants: Secondary | ICD-10-CM

## 2023-02-28 DIAGNOSIS — R413 Other amnesia: Secondary | ICD-10-CM

## 2023-03-01 NOTE — Telephone Encounter (Signed)
Pt is compliant with warfarin management and PCP apts.  Sent in refill of warfarin to requested pharmacy.      

## 2023-03-11 ENCOUNTER — Other Ambulatory Visit: Payer: Self-pay | Admitting: Family Medicine

## 2023-03-28 DIAGNOSIS — I4821 Permanent atrial fibrillation: Secondary | ICD-10-CM | POA: Diagnosis not present

## 2023-04-06 ENCOUNTER — Ambulatory Visit (INDEPENDENT_AMBULATORY_CARE_PROVIDER_SITE_OTHER): Payer: Medicare Other

## 2023-04-06 DIAGNOSIS — Z7901 Long term (current) use of anticoagulants: Secondary | ICD-10-CM

## 2023-04-06 LAB — POCT INR: INR: 2.6 (ref 2.0–3.0)

## 2023-04-06 NOTE — Patient Instructions (Addendum)
Pre visit review using our clinic review tool, if applicable. No additional management support is needed unless otherwise documented below in the visit note.  Continue 1 tablet daily except take 1/2 tablet on Mondays, Wednesdays, and Fridays. Recheck in 6 weeks. 

## 2023-04-06 NOTE — Progress Notes (Signed)
Continue 1 tablet daily except take 1/2 tablet on Mondays, Wednesdays, and Fridays. Recheck in 6 weeks.  

## 2023-04-19 DIAGNOSIS — I351 Nonrheumatic aortic (valve) insufficiency: Secondary | ICD-10-CM | POA: Diagnosis not present

## 2023-04-20 DIAGNOSIS — M79642 Pain in left hand: Secondary | ICD-10-CM | POA: Diagnosis not present

## 2023-04-20 DIAGNOSIS — M353 Polymyalgia rheumatica: Secondary | ICD-10-CM | POA: Diagnosis not present

## 2023-04-20 DIAGNOSIS — M79641 Pain in right hand: Secondary | ICD-10-CM | POA: Diagnosis not present

## 2023-04-20 DIAGNOSIS — M0609 Rheumatoid arthritis without rheumatoid factor, multiple sites: Secondary | ICD-10-CM | POA: Diagnosis not present

## 2023-04-20 DIAGNOSIS — M1991 Primary osteoarthritis, unspecified site: Secondary | ICD-10-CM | POA: Diagnosis not present

## 2023-04-21 ENCOUNTER — Encounter (INDEPENDENT_AMBULATORY_CARE_PROVIDER_SITE_OTHER): Payer: Self-pay

## 2023-04-21 ENCOUNTER — Ambulatory Visit (INDEPENDENT_AMBULATORY_CARE_PROVIDER_SITE_OTHER): Payer: Medicare Other | Admitting: Family Medicine

## 2023-04-21 ENCOUNTER — Encounter: Payer: Self-pay | Admitting: Family Medicine

## 2023-04-21 VITALS — BP 110/62 | HR 64 | Temp 97.7°F | Wt 164.0 lb

## 2023-04-21 DIAGNOSIS — I10 Essential (primary) hypertension: Secondary | ICD-10-CM

## 2023-04-21 DIAGNOSIS — S51012D Laceration without foreign body of left elbow, subsequent encounter: Secondary | ICD-10-CM | POA: Diagnosis not present

## 2023-04-21 DIAGNOSIS — I4811 Longstanding persistent atrial fibrillation: Secondary | ICD-10-CM

## 2023-04-21 DIAGNOSIS — I2510 Atherosclerotic heart disease of native coronary artery without angina pectoris: Secondary | ICD-10-CM

## 2023-04-21 DIAGNOSIS — R29898 Other symptoms and signs involving the musculoskeletal system: Secondary | ICD-10-CM | POA: Diagnosis not present

## 2023-04-21 DIAGNOSIS — M353 Polymyalgia rheumatica: Secondary | ICD-10-CM

## 2023-04-21 DIAGNOSIS — E1169 Type 2 diabetes mellitus with other specified complication: Secondary | ICD-10-CM | POA: Diagnosis not present

## 2023-04-21 LAB — POC COVID19 BINAXNOW: SARS Coronavirus 2 Ag: NEGATIVE

## 2023-04-21 MED ORDER — PREDNISONE 20 MG PO TABS: 40.0000 mg | ORAL_TABLET | Freq: Every day | ORAL | 0 refills | Status: DC

## 2023-04-21 MED ORDER — ALENDRONATE SODIUM 70 MG PO TABS: 70.0000 mg | ORAL_TABLET | ORAL | 3 refills | Status: DC

## 2023-04-21 NOTE — Progress Notes (Signed)
   Subjective:    Patient ID: Tanner Hensley, male    DOB: 15-Dec-1940, 82 y.o.   MRN: [id number]  HPI Here with his wife for a number of issues. First he had the sudden onset of weakness in the legs about 6 weeks ago. It is very difficult for him to stand up out of a chair. He has been using a walker to keep from falling. He saw his rheumatology provider, Azucena Fallen, yesterday, and they asked her it this weakness could be a flare of his polymyalgia rheumatica. She felt that it was not related since he has little pain. He had seen his cardiologist at [facility] on 03-28-23, he felt his atrial fibrillation and other cardiac issues were stable. An ECHO that day showed his EF to be 45-50% with global LV hypokinesis, and this was unchanged from his last one a year ago. Labs that day showed a creatinine of 1.24, Hgb 14.3, TSH 2.016, albumin 4.4, and BNP 414. Theodoro Grist also asks me to check a skin tear on his left forearm, which was caused by a fall on carpet 4 days ago. It has been wrapped with gauze. Finally he asks for a Covid test, because there have been many cases of Covid-19 recently in his facility.    Review of Systems  Constitutional:  Positive for fatigue. Negative for appetite change and fever.  Respiratory: Negative.    Cardiovascular: Negative.   Gastrointestinal: Negative.   Genitourinary: Negative.   Skin:  Positive for wound.  Neurological:  Positive for weakness.  Psychiatric/Behavioral:  Positive for confusion. Negative for agitation, behavioral problems, dysphoric mood and sleep disturbance. The patient is not nervous/anxious.        Objective:   Physical Exam Constitutional:      Appearance: Normal appearance. He is not ill-appearing.  Cardiovascular:     Rate and Rhythm: Normal rate. Rhythm irregular.     Pulses: Normal pulses.     Heart sounds: Normal heart sounds.  Pulmonary:     Effort: Pulmonary effort is normal.     Breath sounds: Normal breath sounds.  Abdominal:      General: There is no distension.     Palpations: Abdomen is soft.     Tenderness: There is no abdominal tenderness. There is no right CVA tenderness or left CVA tenderness.  Skin:    Comments: There is a small superficial skin tear on the lateral left forearm. This is clean   Neurological:     Mental Status: He is alert and oriented to person, place, and time. Mental status is at baseline.           Assessment & Plan:  I think it very likely that his recent leg weakness is due to the PR after all. We will give him a 7 day course of Prednisone 40 mg a day to see if this helps with the weakness. He is doing well from a cardiologic standpoint. His recent skin tear is healing as expected. This was dressed today with Telfa and gauze. He will report back to [place] nect week.  Gershon Crane, MD

## 2023-04-21 NOTE — Addendum Note (Signed)
Addended by: Carola Rhine on: 04/21/2023 01:38 PM   Modules accepted: Orders

## 2023-04-26 ENCOUNTER — Encounter: Payer: Self-pay | Admitting: Family Medicine

## 2023-04-26 DIAGNOSIS — R29898 Other symptoms and signs involving the musculoskeletal system: Secondary | ICD-10-CM

## 2023-04-28 ENCOUNTER — Ambulatory Visit (INDEPENDENT_AMBULATORY_CARE_PROVIDER_SITE_OTHER): Payer: Medicare Other | Admitting: Adult Health

## 2023-04-28 ENCOUNTER — Encounter: Payer: Self-pay | Admitting: Adult Health

## 2023-04-28 ENCOUNTER — Ambulatory Visit: Payer: Medicare Other

## 2023-04-28 VITALS — BP 110/70 | HR 44 | Temp 98.4°F | Ht 65.0 in | Wt 167.0 lb

## 2023-04-28 DIAGNOSIS — M5135 Other intervertebral disc degeneration, thoracolumbar region: Secondary | ICD-10-CM | POA: Diagnosis not present

## 2023-04-28 DIAGNOSIS — M5136 Other intervertebral disc degeneration, lumbar region: Secondary | ICD-10-CM | POA: Diagnosis not present

## 2023-04-28 DIAGNOSIS — I700 Atherosclerosis of aorta: Secondary | ICD-10-CM | POA: Diagnosis not present

## 2023-04-28 DIAGNOSIS — R29898 Other symptoms and signs involving the musculoskeletal system: Secondary | ICD-10-CM

## 2023-04-28 NOTE — Progress Notes (Signed)
Subjective:    Patient ID: ARY PERE, male    DOB: 04/01/1941, 82 y.o.   MRN: [id number]  HPI  82 year old male who  has a past medical history of Arthritis, Atrial fibrillation (HCC), CHF (congestive heart failure) (HCC), Coronary artery disease, Diabetes mellitus type II, Dysrhythmia, GERD (gastroesophageal reflux disease), Gout, High cholesterol, Hypertension, Insomnia, Memory loss, Neuromuscular disorder (HCC), Polymyalgia rheumatica (HCC), Presence of permanent cardiac pacemaker, and Urticaria.  He is a patient of Dr. Clent Ridges who I am seeing today for followup.   He was seen by his PCP 1 week ago for sudden onset of weakness in his legs about 6 weeks prior.  It was very difficult for him to stand up out of his chair.  He had been using a walker to keep from falling.  He was having very little pain at this time.  Rheumatology at around this time did not think that the sudden leg weakness was due to his polymyalgia rheumatica.  Was also seen by his cardiologist in mid July 2024 and felt as though his cardiac issues including atrial fibrillation were stable.  He had an echo that showed an EF to be 45 to 50% with global LV hypokinesis; was unchanged.  Labs were all stable   He was seen by his PCP a week ago and was thought that this could be due to his PR and he was given a 7-day course of prednisone to see if this helps with the weakness.  Today he reports that unfortunately he did not seen any improvement and his wife feels as though he may have actually regressed slightly.   He is using a new seated walker but has to stop and sit several times of going anywhere in a facility except for there are small apartment.  Prior this he was very active playing disk golf, working out, playing corn hole  and walking.   He denies any low back pain, numbness tingling in his lower extremities, issues with bowel or bladder, double vision, facial droop, loss of strength in upper extremities, new  medications starts, recent vaccinations.    Review of Systems See HPI   Past Medical History:  Diagnosis Date   Arthritis    sees Dr. Kellie Simmering   Atrial fibrillation [facility])    (Dr. Eliane Decree at [facility] and Dr. Ladona Ridgel at [facility]. Goes to [facility] polymyalgia rheumatica (Dr. Kellie Simmering)   CHF (congestive heart failure) (HCC)    NOT SURE WHEN   Coronary artery disease    Diabetes mellitus type II    DIET CONTROLLED AND LOST 40 LBS   Dysrhythmia    chronic a=fib with cardiac ablations   GERD (gastroesophageal reflux disease)    Gout    High cholesterol    Hypertension    Insomnia    Memory loss    Neuromuscular disorder (HCC)    POLY MYALGIA   RHEUMATICA   Polymyalgia rheumatica (HCC)    sees Dr. Kellie Simmering    Presence of permanent cardiac pacemaker    [employer]  08/2011   Urticaria     Social History   Socioeconomic History   Marital status: Married    Spouse name: [name]   Number of children: 3   Years of education: 14   Highest education level: Not on file  Occupational History    Comment: retired  Tobacco Use   Smoking status: Never   Smokeless tobacco: Never  Vaping Use   Vaping status:  Never Used  Substance and Sexual Activity   Alcohol use: No    Alcohol/week: 0.0 standard drinks of alcohol   Drug use: No   Sexual activity: Never  Other Topics Concern   Not on file  Social History Narrative   Lives with wife, at home   Caffeine- coffee, 2 cups, Dr Reino Kent 8 oz   Social Determinants of Health   Financial Resource Strain: Low Risk  (10/11/2022)   Overall Financial Resource Strain (CARDIA)    Difficulty of Paying Living Expenses: Not hard at all  Food Insecurity: No Food Insecurity (10/11/2022)   Hunger Vital Sign    Worried About Running Out of Food in the Last Year: Never true    Ran Out of Food in the Last Year: Never true  Transportation Needs: No Transportation Needs (10/11/2022)   PRAPARE - Scientist, research (physical sciences) (Medical): No    Lack of Transportation (Non-Medical): No  Physical Activity: Sufficiently Active (10/11/2022)   Exercise Vital Sign    Days of Exercise per Week: 2 days    Minutes of Exercise per Session: 150+ min  Stress: No Stress Concern Present (10/11/2022)   [employer] of Occupational Health - Occupational Stress Questionnaire    Feeling of Stress : Not at all  Social Connections: Socially Integrated (10/11/2022)   Social Connection and Isolation Panel [NHANES]    Frequency of Communication with Friends and Family: More than three times a week    Frequency of Social Gatherings with Friends and Family: More than three times a week    Attends Religious Services: More than 4 times per year    Active Member of Clubs or Organizations: Yes    Attends Banker Meetings: More than 4 times per year    Marital Status: Married  Catering manager Violence: Not At Risk (10/11/2022)   Humiliation, Afraid, Rape, and Kick questionnaire    Fear of Current or Ex-Partner: No    Emotionally Abused: No    Physically Abused: No    Sexually Abused: No    Past Surgical History:  Procedure Laterality Date   ATRIAL FIBRILLATION ABLATION  2006, 2008, 2013   at [facility]  by Dr. Zara Chess SURGERY     CARDIAC CATHETERIZATION     has had multiple, but last one 2017 (10/13/2016)   COLONOSCOPY W/ BIOPSIES AND POLYPECTOMY  04/05/2017   per Dr. Wendall Papa, no polyps, no repeats needed    CORONARY ANGIOPLASTY WITH STENT PLACEMENT  ~ 2014   "he has 2 stents" (10/13/2016)   ESOPHAGOGASTRODUODENOSCOPY     INGUINAL HERNIA REPAIR Right 11/28/2009   per Dr. Manus Rudd   KNEE ARTHROSCOPY Left 02/03/2010   per Dr. Worthy Rancher   POSTERIOR LUMBAR FUSION  10/14/2016   SPINE SURGERY  10/13/2016   per Dr. Yevette Edwards, L3-4 fusion   TONSILLECTOMY      Family History  Problem Relation Age of Onset   Coronary artery disease Other    Hypertension Other    Heart disease  Other    Heart disease Mother    Heart attack Father    Heart disease Sister    Diabetes Sister    Hypertension Sister    Other Brother        brain tumor   Hypertension Brother    Heart disease Brother     Allergies  Allergen Reactions   Codeine Rash and Other (See Comments)    Dizziness, low  blood pressure and heart rate   Hydrocodone-Acetaminophen Rash and Other (See Comments)    Dizziness, low blood pressure and heart rate   Tramadol Nausea And Vomiting   Lipitor [Atorvastatin] Other (See Comments)    Myalgias    Robaxin [Methocarbamol] Other (See Comments)    Dropped blood pressure too much   Zocor [Simvastatin] Other (See Comments)    myalgias    Current Outpatient Medications on File Prior to Visit  Medication Sig Dispense Refill   acetaminophen (TYLENOL) 500 MG tablet Take 1,000 mg by mouth every 4 (four) hours as needed for mild pain.     alendronate (FOSAMAX) 70 MG tablet Take 1 tablet (70 mg total) by mouth every 7 (seven) days. Take with a full glass of water on an empty stomach. 12 tablet 3   ascorbic acid (VITAMIN C) 500 MG tablet Take 500 mg by mouth daily.     aspirin EC 81 MG tablet Take 81 mg by mouth daily.     atenolol (TENORMIN) 25 MG tablet Take 25 mg by mouth daily.  0   b complex vitamins tablet Take 1 tablet by mouth daily.     benzonatate (TESSALON) 200 MG capsule Take 1 capsule (200 mg total) by mouth every 6 (six) hours as needed for cough. 60 capsule 2   Biotin 5000 MCG CAPS Take 5,000 mcg by mouth daily.      CALCIUM CITRATE-VITAMIN D PO Take 1 tablet by mouth 2 (two) times daily. [phone]     Cholecalciferol (VITAMIN D3 PO) Take by mouth daily at 2 PM.     donepezil (ARICEPT) 10 MG tablet TAKE 1 TABLET BY MOUTH EVERY  NIGHT AT BEDTIME 100 tablet 0   memantine (NAMENDA) 10 MG tablet TAKE 1 TABLET BY MOUTH TWICE  DAILY 200 tablet 2   Multiple Vitamin (MULTIVITAMIN WITH MINERALS) TABS tablet Take 1 tablet by mouth daily. SENIOR MULTIVITAMIN      predniSONE (DELTASONE) 20 MG tablet Take 2 tablets (40 mg total) by mouth daily with breakfast. 14 tablet 0   Vibegron (GEMTESA) 75 MG TABS Take 1 tablet (75 mg total) by mouth daily. 90 tablet 3   VITAMIN D PO Take by mouth.     warfarin (COUMADIN) 5 MG tablet TAKE 1 TABLET BY MOUTH DAILY EXCEPT TAKE 1/2 TABLET BY  MOUTH ON MONDAYS, WEDNESDAYS AND FRIDAYS OR AS DIRECTED BY  ANTICOAGULATION CLINIC 135 tablet 1   No current facility-administered medications on file prior to visit.    BP 110/70   Pulse (!) 44   Temp 98.4 F (36.9 C) (Oral)   Ht 5\' 5"  (1.651 m)   Wt 167 lb (75.8 kg)   SpO2 96%   BMI 27.79 kg/m       Objective:   Physical Exam Vitals and nursing note reviewed.  Constitutional:      Appearance: Normal appearance.  Cardiovascular:     Rate and Rhythm: Normal rate and regular rhythm.     Pulses: Normal pulses.     Heart sounds: Normal heart sounds.  Pulmonary:     Effort: Pulmonary effort is normal.     Breath sounds: Normal breath sounds.  Musculoskeletal:        General: Normal range of motion.     Right lower leg: No edema.     Left lower leg: No edema.  Skin:    General: Skin is warm and dry.  Neurological:     General: No focal deficit present.  Mental Status: He is alert and oriented to person, place, and time.     Cranial Nerves: Cranial nerves 2-12 are intact.     Sensory: Sensation is intact.     Motor: Weakness present. No tremor or atrophy.     Coordination: Coordination abnormal.     Gait: Gait abnormal.     Deep Tendon Reflexes: Reflexes are normal and symmetric.     Comments: - 5/5 strength bilateral lower extremity with extension and flexion. - Sensation in tact in bilateral lower extremities  Psychiatric:        Mood and Affect: Mood normal.        Behavior: Behavior normal.        Thought Content: Thought content normal.        Judgment: Judgment normal.        Assessment & Plan:  1. Weakness of both lower  extremities -Unknown cause for sudden weakness in lower extremities.  Does not appear to be Guillain-Barr or myasthenia gravis.  Did not respond to steroid treatment so likely polymyalgia rheumatica.  Will check ANA, B12, copper levels today.  Also do lumbar spine x-ray.  Consider advanced imaging in the future.  Can also think about sending to neurology and/or physical therapy. - ANA; Future - Vitamin B12; Future - Ceruloplasmin; Future - DG Lumbar Spine Complete; Future - Ceruloplasmin - Vitamin B12 - ANA  Shirline Frees, NP  Time spent with patient today was 33 minutes which consisted of chart review, discussinglower extremity edema, treatment answering questions and documentation.

## 2023-04-29 LAB — VITAMIN B12: Vitamin B-12: 292 pg/mL (ref 211–911)

## 2023-05-01 LAB — ANTI-NUCLEAR AB-TITER (ANA TITER)
ANA TITER: 1:40 {titer} — ABNORMAL HIGH
ANA Titer 1: 1:40 {titer} — ABNORMAL HIGH

## 2023-05-01 LAB — ANA: Anti Nuclear Antibody (ANA): POSITIVE — AB

## 2023-05-01 LAB — CERULOPLASMIN: Ceruloplasmin: 15 mg/dL (ref 14–30)

## 2023-05-04 NOTE — Telephone Encounter (Signed)
I reviewed all his recent labs work, and nothing is reall y significant. I spoke to [facility], and now I wonder if Tanner Hensley may be having a neurologic condition such as myasthenia gravis or ALS. I have referred him to Neurology to evaluate for these conditions

## 2023-05-04 NOTE — Telephone Encounter (Signed)
 Checking on progress of this request.

## 2023-05-05 ENCOUNTER — Ambulatory Visit: Payer: Medicare Other | Admitting: Podiatry

## 2023-05-06 ENCOUNTER — Ambulatory Visit: Payer: Medicare Other | Admitting: Adult Health

## 2023-05-09 ENCOUNTER — Other Ambulatory Visit: Payer: Self-pay | Admitting: Family Medicine

## 2023-05-09 DIAGNOSIS — R413 Other amnesia: Secondary | ICD-10-CM

## 2023-05-10 DIAGNOSIS — Z45018 Encounter for adjustment and management of other part of cardiac pacemaker: Secondary | ICD-10-CM | POA: Diagnosis not present

## 2023-05-11 ENCOUNTER — Encounter: Payer: Self-pay | Admitting: Family Medicine

## 2023-05-12 NOTE — Telephone Encounter (Signed)
I agree we can try this. The RX for the PT is ready to fax

## 2023-05-16 NOTE — Telephone Encounter (Signed)
Please fax to [facility] [phone] and phone number [phone]

## 2023-05-17 NOTE — Telephone Encounter (Signed)
Pt order for PT was faxed to [facility] as requested. Copy sent to scanning

## 2023-05-18 ENCOUNTER — Telehealth: Payer: Self-pay | Admitting: Family Medicine

## 2023-05-18 ENCOUNTER — Ambulatory Visit: Payer: Medicare Other

## 2023-05-18 DIAGNOSIS — Z7901 Long term (current) use of anticoagulants: Secondary | ICD-10-CM

## 2023-05-18 NOTE — Telephone Encounter (Signed)
Tanner Drought, RN, at [facility], returned call. Pt's wife is requesting lab due to difficulty to get pt to coumadin clinic apt today. Labs will be drawn at her facility tomorrow by [facility]. She will email results when received. Provided email address. Tanner Hensley requested lab order be emailed to [email or web address], rather than faxing it. Tanner Hensley when result is received tomorrow this nurse will contact pt and pt's wife with dosing instructions and also send her an email if further lab INR testing is needed. Tanner Hensley verbalized understanding.   Added lab INR order and emailed to above email address.

## 2023-05-18 NOTE — Telephone Encounter (Signed)
Providing an updated fax number   [phone]  attn Alcario Drought

## 2023-05-18 NOTE — Addendum Note (Signed)
Addended by: Sherrie George A on: 05/18/2023 11:08 AM   Modules accepted: Orders

## 2023-05-18 NOTE — Telephone Encounter (Signed)
Requesting order for PT INR to be faxed to [phone]

## 2023-05-18 NOTE — Telephone Encounter (Signed)
LVM for Tanner Hensley at [facility] concerning lab INR order request.  Need further information concerning when lab will be drawn and how the results will be relayed to the coumadin clinic.  Please transfer any calls to the coumadin clinic line at [phone].

## 2023-05-18 NOTE — Telephone Encounter (Signed)
Pt order was faxed to [facility] to the fax number provided. Copy sent to scanning

## 2023-05-19 ENCOUNTER — Ambulatory Visit (INDEPENDENT_AMBULATORY_CARE_PROVIDER_SITE_OTHER): Payer: Medicare Other

## 2023-05-19 ENCOUNTER — Other Ambulatory Visit: Payer: Self-pay | Admitting: Family Medicine

## 2023-05-19 ENCOUNTER — Telehealth: Payer: Self-pay

## 2023-05-19 DIAGNOSIS — Z7901 Long term (current) use of anticoagulants: Secondary | ICD-10-CM | POA: Diagnosis not present

## 2023-05-19 LAB — POCT INR: INR: 2.5 (ref 2.0–3.0)

## 2023-05-19 NOTE — Progress Notes (Cosign Needed Addendum)
Pt has been experiencing some weakness. He has seen a provider for this.  Pt had lab INR draw at his assisted living facility, [facility]. Contact there is RN is Alcario Drought and her email is [email or web address]. Continue 1 tablet daily except take 1/2 tablet on Mondays, Wednesdays, and Fridays. Recheck in 4 weeks, 06/16/23. Contacted pt's wife, Donnamarie Poag, and pt and advised of dosing instructions and next test date. Sent email to [place] concerning dosing and next test date.   Adding lab order for next INR.

## 2023-05-19 NOTE — Telephone Encounter (Signed)
Noted  

## 2023-05-19 NOTE — Telephone Encounter (Signed)
Reviewing pt's chart for lab result and noticed [facility] called concerning PT/INR today. It was sent to provider instead of coumadin clinic nurse.   Sent msg to CMA and advise the coumadin clinic nurse will contact pt and in future send these results to coumadin clinic nurse.   Contacted pt with dosing and next test date.

## 2023-05-19 NOTE — Telephone Encounter (Signed)
Tanner Hensley from [facility] stated that pt lab for PT was high at 25.1 and INR was high at 2.5.

## 2023-05-19 NOTE — Patient Instructions (Addendum)
Pre visit review using our clinic review tool, if applicable. No additional management support is needed unless otherwise documented below in the visit note.  Continue 1 tablet daily except take 1/2 tablet on Mondays, Wednesdays, and Fridays. Recheck in 4 weeks.  

## 2023-05-20 LAB — PROTIME-INR
INR: 2.5 — ABNORMAL HIGH
Prothrombin Time: 25.1 s — ABNORMAL HIGH (ref 9.0–11.5)

## 2023-05-20 LAB — HOUSE ACCOUNT TRACKING

## 2023-05-20 NOTE — Telephone Encounter (Signed)
Tanner Hensley with [facility] is calling to report the same results and the reference [phone]

## 2023-05-20 NOTE — Telephone Encounter (Signed)
FYI. Noted

## 2023-05-20 NOTE — Addendum Note (Signed)
Addended by: Sherrie George A on: 05/20/2023 09:47 AM   Modules accepted: Orders

## 2023-05-20 NOTE — Telephone Encounter (Signed)
Contacted [facility] and they gave same PT INR result from yesterday. They wanted to make sure coumadin clinic had result.   Pt was contacted yesterday with dosing instructions.

## 2023-05-23 ENCOUNTER — Encounter: Payer: Self-pay | Admitting: Diagnostic Neuroimaging

## 2023-05-23 ENCOUNTER — Ambulatory Visit: Payer: Medicare Other | Admitting: Diagnostic Neuroimaging

## 2023-05-23 VITALS — BP 134/80 | HR 61 | Ht 64.0 in | Wt 163.0 lb

## 2023-05-23 DIAGNOSIS — R269 Unspecified abnormalities of gait and mobility: Secondary | ICD-10-CM | POA: Diagnosis not present

## 2023-05-23 DIAGNOSIS — R292 Abnormal reflex: Secondary | ICD-10-CM

## 2023-05-23 DIAGNOSIS — R413 Other amnesia: Secondary | ICD-10-CM

## 2023-05-23 DIAGNOSIS — R29898 Other symptoms and signs involving the musculoskeletal system: Secondary | ICD-10-CM

## 2023-05-23 NOTE — Patient Instructions (Signed)
  SUDDEN ONSET WEAKNESS / COGNITIVE CHANGES - check MRI brain and MRI cervical spine - check labs

## 2023-05-23 NOTE — Progress Notes (Signed)
[facility]  PATIENT: Tanner Hensley DOB: 1941/02/27  REFERRING CLINICIAN: Nelwyn Salisbury, MD  HISTORY FROM: patient  REASON FOR VISIT: follow up    HISTORICAL  CHIEF COMPLAINT:  Chief Complaint  Patient presents with   Follow-up    Patient in room #6 with his wife. Patient states he has some weakness in his legs and falling a few time lately.    HISTORY OF PRESENT ILLNESS:   UPDATE (05/23/23, VRP): Since last visit, patient was doing well.  He and wife moved into friend's home in April 2024 with a good transition.  He was continued to be very active.  In June 2024 he played several rounds of disc golf and did quite well.  However around March 23, 2023 he had fairly sudden onset of weakness in his legs.  He developed some weakness in his arms.  He also had some significant cognitive changes and cognitive Graciela Husbands around that time.  He went from walking dependently to using a walker to using a wheelchair.  He was evaluated by PCP.  He was treated apparently with prednisone without benefit.  In the last few weeks symptoms have slightly improved.  UPDATE (11/06/21, VRP): 82 year old male with recurrent syncope since 2021. Had 4 episodes of syncope, while on commode. Last event in Jan 2023. All episodes start with lightheaded, dizziness, then LOC x 2-3 minutes. All events associated with very low BP initially (SBP 50-60), then rebound high BP over next few hours up to (SBP 200-210). Then fluctuations over next few days. EMS called for all events. No tongue biting or convulsions. Mild fatigue and weakness afterwards. Last event in Jan 2023 was slightly different, with partial LOC, and mild tremors on right side noted (patient was slumped to left with left arm and leg resting against sink, counter).   PRIOR HPI (06/05/18): 82 year old male here for evaluation of memory problems.  Patient has had memory problems since 2012, with short-term forgetfulness, difficulty with driving  directions, difficulty making decisions, difficulty with new situations and tasks.  Symptoms are worse with stress and aggravation.  Some agitation.  Some slower cognitive processes.  He was about it by another neurologist in 2015, diagnosed with MCI, and offered Aricept.  Patient took this for 1 weeks and had to stop due to side effects.  2017 patient was tried on memantine by PCP and was able to tolerate this.  In 2018 he was retried on donepezil and did well on it.  Patient still able to do lots of his activities of daily living but needs more help with that.  He still drives, only with short distances in familiar locations.   REVIEW OF SYSTEMS: Full 14 system review of systems performed and negative with exception of: Restless legs memory loss confusion easy bruising cramps joint pain hearing loss runny nose.   ALLERGIES: Allergies  Allergen Reactions   Codeine Rash and Other (See Comments)    Dizziness, low blood pressure and heart rate   Hydrocodone-Acetaminophen Rash and Other (See Comments)    Dizziness, low blood pressure and heart rate   Tramadol Nausea And Vomiting   Lipitor [Atorvastatin] Other (See Comments)    Myalgias    Robaxin [Methocarbamol] Other (See Comments)    Dropped blood pressure too much   Zocor [Simvastatin] Other (See Comments)    myalgias    HOME MEDICATIONS: Outpatient Medications Prior to Visit  Medication Sig Dispense Refill   alendronate (FOSAMAX) 70 MG tablet Take  1 tablet (70 mg total) by mouth every 7 (seven) days. Take with a full glass of water on an empty stomach. 12 tablet 3   ascorbic acid (VITAMIN C) 500 MG tablet Take 500 mg by mouth daily.     aspirin EC 81 MG tablet Take 81 mg by mouth daily.     atenolol (TENORMIN) 25 MG tablet Take 25 mg by mouth daily.  0   b complex vitamins tablet Take 1 tablet by mouth daily.     benzonatate (TESSALON) 200 MG capsule Take 1 capsule (200 mg total) by mouth every 6 (six) hours as needed for cough.  60 capsule 2   Biotin 5000 MCG CAPS Take 5,000 mcg by mouth daily.      CALCIUM CITRATE-VITAMIN D PO Take 1 tablet by mouth 2 (two) times daily. [phone]     Cholecalciferol (VITAMIN D3 PO) Take by mouth daily at 2 PM.     donepezil (ARICEPT) 10 MG tablet TAKE 1 TABLET BY MOUTH EVERY  NIGHT AT BEDTIME 100 tablet 2   memantine (NAMENDA) 10 MG tablet TAKE 1 TABLET BY MOUTH TWICE  DAILY 200 tablet 2   Multiple Vitamin (MULTIVITAMIN WITH MINERALS) TABS tablet Take 1 tablet by mouth daily. SENIOR MULTIVITAMIN     predniSONE (DELTASONE) 20 MG tablet Take 2 tablets (40 mg total) by mouth daily with breakfast. 14 tablet 0   Vibegron (GEMTESA) 75 MG TABS Take 1 tablet (75 mg total) by mouth daily. 90 tablet 3   VITAMIN D PO Take by mouth.     warfarin (COUMADIN) 5 MG tablet TAKE 1 TABLET BY MOUTH DAILY EXCEPT TAKE 1/2 TABLET BY  MOUTH ON MONDAYS, WEDNESDAYS AND FRIDAYS OR AS DIRECTED BY  ANTICOAGULATION CLINIC 135 tablet 1   acetaminophen (TYLENOL) 500 MG tablet Take 1,000 mg by mouth every 4 (four) hours as needed for mild pain. (Patient not taking: Reported on 05/23/2023)     No facility-administered medications prior to visit.    PAST MEDICAL HISTORY: Past Medical History:  Diagnosis Date   Arthritis    sees Dr. Kellie Simmering   Atrial fibrillation [facility])    (Dr. Eliane Decree at [facility] and Dr. Ladona Ridgel at [facility]. Goes to [facility] polymyalgia rheumatica (Dr. Kellie Simmering)   CHF (congestive heart failure) (HCC)    NOT SURE WHEN   Coronary artery disease    Diabetes mellitus type II    DIET CONTROLLED AND LOST 40 LBS   Dysrhythmia    chronic a=fib with cardiac ablations   GERD (gastroesophageal reflux disease)    Gout    High cholesterol    Hypertension    Insomnia    Memory loss    Neuromuscular disorder (HCC)    POLY MYALGIA   RHEUMATICA   Polymyalgia rheumatica (HCC)    sees Dr. Kellie Simmering    Presence of permanent cardiac pacemaker    [employer]  08/2011   Urticaria      PAST SURGICAL HISTORY: Past Surgical History:  Procedure Laterality Date   ATRIAL FIBRILLATION ABLATION  2006, 2008, 2013   at [facility]  by Dr. Zara Chess SURGERY     CARDIAC CATHETERIZATION     has had multiple, but last one 2017 (10/13/2016)   COLONOSCOPY W/ BIOPSIES AND POLYPECTOMY  04/05/2017   per Dr. Wendall Papa, no polyps, no repeats needed    CORONARY ANGIOPLASTY WITH STENT PLACEMENT  ~ 2014   "he has 2 stents" (10/13/2016)   ESOPHAGOGASTRODUODENOSCOPY  INGUINAL HERNIA REPAIR Right 11/28/2009   per Dr. Manus Rudd   KNEE ARTHROSCOPY Left 02/03/2010   per Dr. Worthy Rancher   POSTERIOR LUMBAR FUSION  10/14/2016   SPINE SURGERY  10/13/2016   per Dr. Yevette Edwards, L3-4 fusion   TONSILLECTOMY      FAMILY HISTORY: Family History  Problem Relation Age of Onset   Coronary artery disease Other    Hypertension Other    Heart disease Other    Heart disease Mother    Heart attack Father    Heart disease Sister    Diabetes Sister    Hypertension Sister    Other Brother        brain tumor   Hypertension Brother    Heart disease Brother     SOCIAL HISTORY: Social History   Socioeconomic History   Marital status: Married    Spouse name: [name]   Number of children: 3   Years of education: 14   Highest education level: Not on file  Occupational History    Comment: retired  Tobacco Use   Smoking status: Never   Smokeless tobacco: Never  Vaping Use   Vaping status: Never Used  Substance and Sexual Activity   Alcohol use: No    Alcohol/week: 0.0 standard drinks of alcohol   Drug use: No   Sexual activity: Never  Other Topics Concern   Not on file  Social History Narrative   Lives with wife, at home   Caffeine- coffee, 2 cups, Dr Reino Kent 8 oz   Social Determinants of Health   Financial Resource Strain: Low Risk  (10/11/2022)   Overall Financial Resource Strain (CARDIA)    Difficulty of Paying Living Expenses: Not hard at all  Food Insecurity:  No Food Insecurity (10/11/2022)   Hunger Vital Sign    Worried About Running Out of Food in the Last Year: Never true    Ran Out of Food in the Last Year: Never true  Transportation Needs: No Transportation Needs (10/11/2022)   PRAPARE - Administrator, Civil Service (Medical): No    Lack of Transportation (Non-Medical): No  Physical Activity: Sufficiently Active (10/11/2022)   Exercise Vital Sign    Days of Exercise per Week: 2 days    Minutes of Exercise per Session: 150+ min  Stress: No Stress Concern Present (10/11/2022)   [employer] of Occupational Health - Occupational Stress Questionnaire    Feeling of Stress : Not at all  Social Connections: Socially Integrated (10/11/2022)   Social Connection and Isolation Panel [NHANES]    Frequency of Communication with Friends and Family: More than three times a week    Frequency of Social Gatherings with Friends and Family: More than three times a week    Attends Religious Services: More than 4 times per year    Active Member of [employer] or Organizations: Yes    Attends Engineer, structural: More than 4 times per year    Marital Status: Married  Catering manager Violence: Not At Risk (10/11/2022)   Humiliation, Afraid, Rape, and Kick questionnaire    Fear of Current or Ex-Partner: No    Emotionally Abused: No    Physically Abused: No    Sexually Abused: No     PHYSICAL EXAM  GENERAL EXAM/CONSTITUTIONAL: Vitals:  Vitals:   05/23/23 1156  BP: 134/80  Pulse: 61  Weight: 163 lb (73.9 kg)  Height: 5\' 4"  (1.626 m)   Body mass index is 27.98 kg/m. Wt Readings  from Last 3 Encounters:  05/23/23 163 lb (73.9 kg)  04/28/23 167 lb (75.8 kg)  04/21/23 164 lb (74.4 kg)   Patient is in no distress; well developed, nourished and groomed; neck is supple  CARDIOVASCULAR: Examination of carotid arteries is normal; no carotid bruits Regular rate and rhythm, no murmurs Examination of peripheral vascular system by observation  and palpation is normal  EYES: Ophthalmoscopic exam of optic discs and posterior segments is normal; no papilledema or hemorrhages No results found.  MUSCULOSKELETAL: Gait, strength, tone, movements noted in Neurologic exam below  NEUROLOGIC: MENTAL STATUS:      06/05/2018    8:19 AM  MMSE - Mini Mental State Exam  Orientation to time 3  Orientation to Place 4  Registration 3  Attention/ Calculation 1  Recall 2  Language- name 2 objects 2  Language- repeat 0  Language- follow 3 step command 3  Language- read & follow direction 1  Write a sentence 1  Copy design 1  Total score 21   awake, alert, oriented to person, place and time recent and remote memory intact normal attention and concentration language fluent, comprehension intact, naming intact fund of knowledge appropriate  CRANIAL NERVE:  2nd - no papilledema on fundoscopic exam 2nd, 3rd, 4th, 6th - pupils equal and reactive to light, visual fields full to confrontation, extraocular muscles intact, no nystagmus 5th - facial sensation symmetric 7th - facial strength symmetric 8th - hearing intact 9th - palate elevates symmetrically, uvula midline 11th - shoulder shrug symmetric 12th - tongue protrusion midline  MOTOR:  normal bulk and tone BUE 3-4 BLE 2-3 PROX AND 3-4 DISTAL  SENSORY:  normal and symmetric to light touch, temperature, vibration  COORDINATION:  finger-nose-finger, fine finger movements normal  REFLEXES:  deep tendon reflexes BRISK IN BUE AND KNEES; TRACE AT ANKLES NEGATIVE HOFFMANS  GAIT/STATION:  IN WHEEL CHAIR     DIAGNOSTIC DATA (LABS, IMAGING, TESTING) - I reviewed patient records, labs, notes, testing and imaging myself where available.  Lab Results  Component Value Date   WBC 8.2 08/20/2022   HGB 14.2 08/20/2022   HCT 41.9 08/20/2022   MCV 106.8 (H) 08/20/2022   PLT 136.0 (L) 08/20/2022      Component Value Date/Time   NA 142 08/20/2022 1002   K 5.1 08/20/2022  1002   CL 105 08/20/2022 1002   CO2 31 08/20/2022 1002   GLUCOSE 112 (H) 08/20/2022 1002   BUN 17 08/20/2022 1002   CREATININE 1.20 08/20/2022 1002   CREATININE 1.20 (H) 06/24/2020 1104   CALCIUM 10.3 08/20/2022 1002   PROT 6.5 08/20/2022 1002   ALBUMIN 4.3 08/20/2022 1002   AST 28 08/20/2022 1002   ALT 24 08/20/2022 1002   ALKPHOS 64 08/20/2022 1002   BILITOT 0.9 08/20/2022 1002   GFRNONAA >60 11/05/2020 1206   GFRAA >60 10/16/2016 0522   Lab Results  Component Value Date   CHOL 224 (H) 08/20/2022   HDL 58.80 08/20/2022   LDLCALC 145 (H) 08/20/2022   LDLDIRECT 176.1 07/20/2012   TRIG 102.0 08/20/2022   CHOLHDL 4 08/20/2022   Lab Results  Component Value Date   HGBA1C 6.2 08/20/2022   Lab Results  Component Value Date   VITAMINB12 292 04/28/2023   Lab Results  Component Value Date   TSH 2.29 08/20/2022    10/13/16 CT head [I reviewed images myself and agree with interpretation. -VRP]  1. No acute intracranial abnormality. 2. Advanced chronic microvascular ischemia.  09/18/21 CT  head - No acute intracranial process.  10/26/20 TTE 1. Left ventricular ejection fraction, by estimation, is 60 to 65%. The  left ventricle has normal function. The left ventricle has no regional  wall motion abnormalities. There is mild concentric left ventricular  hypertrophy. Left ventricular diastolic  parameters are consistent with Grade III diastolic dysfunction  (restrictive). Elevated left atrial pressure.   2. Right ventricular systolic function is normal. The right ventricular  size is normal. There is normal pulmonary artery systolic pressure. The  estimated right ventricular systolic pressure is 33.0 mmHg.   3. Left atrial size was mildly dilated.   4. The mitral valve is normal in structure. Mild mitral valve  regurgitation. No evidence of mitral stenosis.   5. The aortic valve is normal in structure. There is moderate  calcification of the aortic valve. There is moderate  thickening of the  aortic valve. Aortic valve regurgitation is mild. Mild aortic valve  stenosis.   6. 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   7. The inferior vena cava is normal in size with greater than 50%  respiratory variability, suggesting right atrial pressure of 3 mmHg.    ASSESSMENT AND PLAN  82 y.o. year old male here with gradual onset progressive short-term memory loss confusion consistent with neurodegenerative dementia.  Dx:  1. Memory loss   2. Gait difficulty   3. Weakness of both lower extremities   4. Left arm weakness   5. Hyperreflexia       PLAN:  SUDDEN ONSET WEAKNESS of upper and lower extremities / COGNITIVE CHANGES (since ~July 2024) - check MRI brain (rule out stroke, mass, inflamm) and MRI cervical spine (rule out myelopathy; now with weakness in the upper and lower extremities with hyperreflexia) - check labs (neuromuscular dz)  VASOVAGAL SYNCOPE x 4 (prodromal lightheadedness, situational on commode; no post-ictal confusion; mild tremors likely related to hypoperfusion, no convulsions) - now resolved; monitor BP at home; doubt seizures  MEMORY LOSS (suspected mild dementia) - continue memantine 10mg  twice a day + donepezil 10mg  at bedtime - safety / supervision issues reviewed - daily physical activity / exercise (at least 15-30 minutes) - eat more plants / vegetables - increase social activities, brain stimulation, games, puzzles, hobbies, crafts, arts, music - aim for at least 7-8 hours sleep per night (or more) - avoid smoking and alcohol - caution with medications, finances; no driving  Orders Placed This Encounter  Procedures   MR BRAIN W WO CONTRAST   MR CERVICAL SPINE WO CONTRAST   CK   Aldolase   AChR Abs with Reflex to MuSK   Return for pending test results, pending if symptoms worsen or fail to improve.    Suanne Marker, MD 05/23/2023, 12:20 PM Certified in Neurology,  Neurophysiology and Neuroimaging  [facility] [address], [address] [place], [place] [address] [phone]

## 2023-05-24 DIAGNOSIS — R262 Difficulty in walking, not elsewhere classified: Secondary | ICD-10-CM | POA: Diagnosis not present

## 2023-05-24 DIAGNOSIS — M6281 Muscle weakness (generalized): Secondary | ICD-10-CM | POA: Diagnosis not present

## 2023-05-25 ENCOUNTER — Telehealth: Payer: Self-pay | Admitting: Diagnostic Neuroimaging

## 2023-05-25 NOTE — Telephone Encounter (Signed)
[employer] medicare NPR sent to [facility] [phone] 

## 2023-05-27 DIAGNOSIS — M6281 Muscle weakness (generalized): Secondary | ICD-10-CM | POA: Diagnosis not present

## 2023-05-27 DIAGNOSIS — R262 Difficulty in walking, not elsewhere classified: Secondary | ICD-10-CM | POA: Diagnosis not present

## 2023-05-30 DIAGNOSIS — M6281 Muscle weakness (generalized): Secondary | ICD-10-CM | POA: Diagnosis not present

## 2023-05-30 DIAGNOSIS — R262 Difficulty in walking, not elsewhere classified: Secondary | ICD-10-CM | POA: Diagnosis not present

## 2023-05-30 NOTE — Telephone Encounter (Signed)
Pt's wife Porter Bustillos said [facility] could not do the MRI due to pt has a pacemaker and would need to be done at the hospital. Would like a call back to know what to do.

## 2023-05-30 NOTE — Telephone Encounter (Signed)
[employer] medicare NPR sent to [facility] for pacemaker [phone]

## 2023-06-01 DIAGNOSIS — M6281 Muscle weakness (generalized): Secondary | ICD-10-CM | POA: Diagnosis not present

## 2023-06-01 DIAGNOSIS — R262 Difficulty in walking, not elsewhere classified: Secondary | ICD-10-CM | POA: Diagnosis not present

## 2023-06-03 DIAGNOSIS — M6281 Muscle weakness (generalized): Secondary | ICD-10-CM | POA: Diagnosis not present

## 2023-06-03 DIAGNOSIS — R262 Difficulty in walking, not elsewhere classified: Secondary | ICD-10-CM | POA: Diagnosis not present

## 2023-06-06 DIAGNOSIS — R262 Difficulty in walking, not elsewhere classified: Secondary | ICD-10-CM | POA: Diagnosis not present

## 2023-06-06 DIAGNOSIS — M6281 Muscle weakness (generalized): Secondary | ICD-10-CM | POA: Diagnosis not present

## 2023-06-06 LAB — ACHR ABS WITH REFLEX TO MUSK: AChR Binding Ab, Serum: <0.03 nmol/L (ref 0.00–0.24)

## 2023-06-06 LAB — MUSK ANTIBODIES: MuSK Antibodies: <1 U/mL

## 2023-06-06 LAB — CK: Total CK: 60 U/L (ref 30–208)

## 2023-06-06 LAB — ALDOLASE: Aldolase: 3.9 U/L (ref 3.3–10.3)

## 2023-06-07 ENCOUNTER — Institutional Professional Consult (permissible substitution): Payer: Medicare Other | Admitting: Diagnostic Neuroimaging

## 2023-06-08 DIAGNOSIS — R278 Other lack of coordination: Secondary | ICD-10-CM | POA: Diagnosis not present

## 2023-06-08 DIAGNOSIS — R262 Difficulty in walking, not elsewhere classified: Secondary | ICD-10-CM | POA: Diagnosis not present

## 2023-06-08 DIAGNOSIS — M6281 Muscle weakness (generalized): Secondary | ICD-10-CM | POA: Diagnosis not present

## 2023-06-10 DIAGNOSIS — R262 Difficulty in walking, not elsewhere classified: Secondary | ICD-10-CM | POA: Diagnosis not present

## 2023-06-10 DIAGNOSIS — R278 Other lack of coordination: Secondary | ICD-10-CM | POA: Diagnosis not present

## 2023-06-10 DIAGNOSIS — M6281 Muscle weakness (generalized): Secondary | ICD-10-CM | POA: Diagnosis not present

## 2023-06-13 DIAGNOSIS — R262 Difficulty in walking, not elsewhere classified: Secondary | ICD-10-CM | POA: Diagnosis not present

## 2023-06-13 DIAGNOSIS — M6281 Muscle weakness (generalized): Secondary | ICD-10-CM | POA: Diagnosis not present

## 2023-06-13 DIAGNOSIS — R278 Other lack of coordination: Secondary | ICD-10-CM | POA: Diagnosis not present

## 2023-06-15 DIAGNOSIS — R278 Other lack of coordination: Secondary | ICD-10-CM | POA: Diagnosis not present

## 2023-06-15 DIAGNOSIS — M6281 Muscle weakness (generalized): Secondary | ICD-10-CM | POA: Diagnosis not present

## 2023-06-15 DIAGNOSIS — R262 Difficulty in walking, not elsewhere classified: Secondary | ICD-10-CM | POA: Diagnosis not present

## 2023-06-17 ENCOUNTER — Telehealth: Payer: Self-pay

## 2023-06-17 DIAGNOSIS — R262 Difficulty in walking, not elsewhere classified: Secondary | ICD-10-CM | POA: Diagnosis not present

## 2023-06-17 DIAGNOSIS — R278 Other lack of coordination: Secondary | ICD-10-CM | POA: Diagnosis not present

## 2023-06-17 DIAGNOSIS — M6281 Muscle weakness (generalized): Secondary | ICD-10-CM | POA: Diagnosis not present

## 2023-06-17 NOTE — Telephone Encounter (Signed)
Pt was to have lab draw INR at his facility, [facility], yesterday but no result was received. Emailed contact, Tanner Hensley, and she reported pt did not go to the lab yesterday. Today she reports she talked to the pt's wife, Tanner Hensley, and she said she was going to take the pt to the MD office for a lab today.   There is no record in pt's chart that he has any apt for a lab draw today.   Tried to contact pt's wife but number goes straight to VM. Advised to contact coumadin clinic with update concerning lab and that coumadin clinic does not recommend lab draw INR on Friday because the result will not be received until Monday.     No return call from pt's wife yet.

## 2023-06-20 DIAGNOSIS — R278 Other lack of coordination: Secondary | ICD-10-CM | POA: Diagnosis not present

## 2023-06-20 DIAGNOSIS — R262 Difficulty in walking, not elsewhere classified: Secondary | ICD-10-CM | POA: Diagnosis not present

## 2023-06-20 DIAGNOSIS — M6281 Muscle weakness (generalized): Secondary | ICD-10-CM | POA: Diagnosis not present

## 2023-06-20 NOTE — Telephone Encounter (Signed)
Pt's wife returned call. She reports pt would like to come back to the clinic for INR checks. Scheduled pt for 10/16 at [facility]. Donnamarie Poag verbalized understanding.

## 2023-06-20 NOTE — Telephone Encounter (Signed)
No note of having lab for INR performed on Friday.  LVM to inquire when pt will have INR performed this week.

## 2023-06-22 ENCOUNTER — Ambulatory Visit: Payer: Medicare Other

## 2023-06-22 DIAGNOSIS — M6281 Muscle weakness (generalized): Secondary | ICD-10-CM | POA: Diagnosis not present

## 2023-06-22 DIAGNOSIS — R278 Other lack of coordination: Secondary | ICD-10-CM | POA: Diagnosis not present

## 2023-06-22 DIAGNOSIS — Z7901 Long term (current) use of anticoagulants: Secondary | ICD-10-CM | POA: Diagnosis not present

## 2023-06-22 DIAGNOSIS — Z23 Encounter for immunization: Secondary | ICD-10-CM

## 2023-06-22 DIAGNOSIS — R262 Difficulty in walking, not elsewhere classified: Secondary | ICD-10-CM | POA: Diagnosis not present

## 2023-06-22 LAB — POCT INR: INR: 3.2 — AB (ref 2.0–3.0)

## 2023-06-22 NOTE — Patient Instructions (Addendum)
Pre visit review using our clinic review tool, if applicable. No additional management support is needed unless otherwise documented below in the visit note.  Pt already took warfarin today.  Reduce dose tomorrow to take 1/2 tablet and then continue 1 tablet daily except take 1/2 tablet on Mondays, Wednesdays, and Fridays. Recheck in 4 weeks.

## 2023-06-22 NOTE — Progress Notes (Signed)
Pt already took warfarin today.  Reduce dose tomorrow to take 1/2 tablet and then continue 1 tablet daily except take 1/2 tablet on Mondays, Wednesdays, and Fridays. Recheck in 4 weeks.  Pt requested high dose flu vaccine. Administered vaccine. Pt tolerated well.

## 2023-06-24 DIAGNOSIS — R262 Difficulty in walking, not elsewhere classified: Secondary | ICD-10-CM | POA: Diagnosis not present

## 2023-06-24 DIAGNOSIS — R278 Other lack of coordination: Secondary | ICD-10-CM | POA: Diagnosis not present

## 2023-06-24 DIAGNOSIS — M6281 Muscle weakness (generalized): Secondary | ICD-10-CM | POA: Diagnosis not present

## 2023-06-27 DIAGNOSIS — M6281 Muscle weakness (generalized): Secondary | ICD-10-CM | POA: Diagnosis not present

## 2023-06-27 DIAGNOSIS — R278 Other lack of coordination: Secondary | ICD-10-CM | POA: Diagnosis not present

## 2023-06-27 DIAGNOSIS — R262 Difficulty in walking, not elsewhere classified: Secondary | ICD-10-CM | POA: Diagnosis not present

## 2023-06-28 DIAGNOSIS — M6281 Muscle weakness (generalized): Secondary | ICD-10-CM | POA: Diagnosis not present

## 2023-06-28 DIAGNOSIS — R262 Difficulty in walking, not elsewhere classified: Secondary | ICD-10-CM | POA: Diagnosis not present

## 2023-06-28 DIAGNOSIS — R278 Other lack of coordination: Secondary | ICD-10-CM | POA: Diagnosis not present

## 2023-06-29 DIAGNOSIS — I4821 Permanent atrial fibrillation: Secondary | ICD-10-CM | POA: Diagnosis not present

## 2023-06-29 DIAGNOSIS — R278 Other lack of coordination: Secondary | ICD-10-CM | POA: Diagnosis not present

## 2023-06-29 DIAGNOSIS — Z950 Presence of cardiac pacemaker: Secondary | ICD-10-CM | POA: Diagnosis not present

## 2023-06-29 DIAGNOSIS — M6281 Muscle weakness (generalized): Secondary | ICD-10-CM | POA: Diagnosis not present

## 2023-06-29 DIAGNOSIS — R262 Difficulty in walking, not elsewhere classified: Secondary | ICD-10-CM | POA: Diagnosis not present

## 2023-07-01 DIAGNOSIS — M6281 Muscle weakness (generalized): Secondary | ICD-10-CM | POA: Diagnosis not present

## 2023-07-01 DIAGNOSIS — R262 Difficulty in walking, not elsewhere classified: Secondary | ICD-10-CM | POA: Diagnosis not present

## 2023-07-01 DIAGNOSIS — R278 Other lack of coordination: Secondary | ICD-10-CM | POA: Diagnosis not present

## 2023-07-04 DIAGNOSIS — R278 Other lack of coordination: Secondary | ICD-10-CM | POA: Diagnosis not present

## 2023-07-04 DIAGNOSIS — R262 Difficulty in walking, not elsewhere classified: Secondary | ICD-10-CM | POA: Diagnosis not present

## 2023-07-04 DIAGNOSIS — M6281 Muscle weakness (generalized): Secondary | ICD-10-CM | POA: Diagnosis not present

## 2023-07-06 DIAGNOSIS — M6281 Muscle weakness (generalized): Secondary | ICD-10-CM | POA: Diagnosis not present

## 2023-07-06 DIAGNOSIS — R262 Difficulty in walking, not elsewhere classified: Secondary | ICD-10-CM | POA: Diagnosis not present

## 2023-07-06 DIAGNOSIS — R278 Other lack of coordination: Secondary | ICD-10-CM | POA: Diagnosis not present

## 2023-07-07 DIAGNOSIS — I358 Other nonrheumatic aortic valve disorders: Secondary | ICD-10-CM | POA: Diagnosis not present

## 2023-07-07 DIAGNOSIS — I7121 Aneurysm of the ascending aorta, without rupture: Secondary | ICD-10-CM | POA: Diagnosis not present

## 2023-07-07 DIAGNOSIS — N200 Calculus of kidney: Secondary | ICD-10-CM | POA: Diagnosis not present

## 2023-07-07 DIAGNOSIS — I517 Cardiomegaly: Secondary | ICD-10-CM | POA: Diagnosis not present

## 2023-07-07 DIAGNOSIS — I2510 Atherosclerotic heart disease of native coronary artery without angina pectoris: Secondary | ICD-10-CM | POA: Diagnosis not present

## 2023-07-07 DIAGNOSIS — I700 Atherosclerosis of aorta: Secondary | ICD-10-CM | POA: Diagnosis not present

## 2023-07-08 DIAGNOSIS — R262 Difficulty in walking, not elsewhere classified: Secondary | ICD-10-CM | POA: Diagnosis not present

## 2023-07-08 DIAGNOSIS — M6281 Muscle weakness (generalized): Secondary | ICD-10-CM | POA: Diagnosis not present

## 2023-07-11 DIAGNOSIS — M6281 Muscle weakness (generalized): Secondary | ICD-10-CM | POA: Diagnosis not present

## 2023-07-11 DIAGNOSIS — R262 Difficulty in walking, not elsewhere classified: Secondary | ICD-10-CM | POA: Diagnosis not present

## 2023-07-12 DIAGNOSIS — I4821 Permanent atrial fibrillation: Secondary | ICD-10-CM | POA: Diagnosis not present

## 2023-07-12 DIAGNOSIS — Z950 Presence of cardiac pacemaker: Secondary | ICD-10-CM | POA: Diagnosis not present

## 2023-07-13 DIAGNOSIS — R262 Difficulty in walking, not elsewhere classified: Secondary | ICD-10-CM | POA: Diagnosis not present

## 2023-07-13 DIAGNOSIS — M6281 Muscle weakness (generalized): Secondary | ICD-10-CM | POA: Diagnosis not present

## 2023-07-15 DIAGNOSIS — M6281 Muscle weakness (generalized): Secondary | ICD-10-CM | POA: Diagnosis not present

## 2023-07-15 DIAGNOSIS — R262 Difficulty in walking, not elsewhere classified: Secondary | ICD-10-CM | POA: Diagnosis not present

## 2023-07-18 DIAGNOSIS — R262 Difficulty in walking, not elsewhere classified: Secondary | ICD-10-CM | POA: Diagnosis not present

## 2023-07-18 DIAGNOSIS — M6281 Muscle weakness (generalized): Secondary | ICD-10-CM | POA: Diagnosis not present

## 2023-07-20 ENCOUNTER — Ambulatory Visit (INDEPENDENT_AMBULATORY_CARE_PROVIDER_SITE_OTHER): Payer: Medicare Other

## 2023-07-20 DIAGNOSIS — Z7901 Long term (current) use of anticoagulants: Secondary | ICD-10-CM

## 2023-07-20 DIAGNOSIS — M6281 Muscle weakness (generalized): Secondary | ICD-10-CM | POA: Diagnosis not present

## 2023-07-20 DIAGNOSIS — R262 Difficulty in walking, not elsewhere classified: Secondary | ICD-10-CM | POA: Diagnosis not present

## 2023-07-20 LAB — POCT INR: INR: 2.1 (ref 2.0–3.0)

## 2023-07-20 NOTE — Progress Notes (Signed)
Continue 1 tablet daily except take 1/2 tablet on Mondays, Wednesdays, and Fridays. Recheck in 5 weeks.  ?

## 2023-07-20 NOTE — Patient Instructions (Addendum)
Pre visit review using our clinic review tool, if applicable. No additional management support is needed unless otherwise documented below in the visit note. ? ?Continue 1 tablet daily except take 1/2 tablet on Mondays, Wednesdays, and Fridays. Recheck in 5 weeks.  ?

## 2023-07-22 DIAGNOSIS — R262 Difficulty in walking, not elsewhere classified: Secondary | ICD-10-CM | POA: Diagnosis not present

## 2023-07-22 DIAGNOSIS — M6281 Muscle weakness (generalized): Secondary | ICD-10-CM | POA: Diagnosis not present

## 2023-07-25 DIAGNOSIS — R262 Difficulty in walking, not elsewhere classified: Secondary | ICD-10-CM | POA: Diagnosis not present

## 2023-07-25 DIAGNOSIS — M6281 Muscle weakness (generalized): Secondary | ICD-10-CM | POA: Diagnosis not present

## 2023-07-27 DIAGNOSIS — M6281 Muscle weakness (generalized): Secondary | ICD-10-CM | POA: Diagnosis not present

## 2023-07-27 DIAGNOSIS — R262 Difficulty in walking, not elsewhere classified: Secondary | ICD-10-CM | POA: Diagnosis not present

## 2023-07-29 DIAGNOSIS — M6281 Muscle weakness (generalized): Secondary | ICD-10-CM | POA: Diagnosis not present

## 2023-07-29 DIAGNOSIS — R262 Difficulty in walking, not elsewhere classified: Secondary | ICD-10-CM | POA: Diagnosis not present

## 2023-08-01 DIAGNOSIS — M6281 Muscle weakness (generalized): Secondary | ICD-10-CM | POA: Diagnosis not present

## 2023-08-01 DIAGNOSIS — R262 Difficulty in walking, not elsewhere classified: Secondary | ICD-10-CM | POA: Diagnosis not present

## 2023-08-03 DIAGNOSIS — M6281 Muscle weakness (generalized): Secondary | ICD-10-CM | POA: Diagnosis not present

## 2023-08-03 DIAGNOSIS — R262 Difficulty in walking, not elsewhere classified: Secondary | ICD-10-CM | POA: Diagnosis not present

## 2023-08-08 DIAGNOSIS — R262 Difficulty in walking, not elsewhere classified: Secondary | ICD-10-CM | POA: Diagnosis not present

## 2023-08-08 DIAGNOSIS — M6281 Muscle weakness (generalized): Secondary | ICD-10-CM | POA: Diagnosis not present

## 2023-08-10 DIAGNOSIS — M6281 Muscle weakness (generalized): Secondary | ICD-10-CM | POA: Diagnosis not present

## 2023-08-10 DIAGNOSIS — R262 Difficulty in walking, not elsewhere classified: Secondary | ICD-10-CM | POA: Diagnosis not present

## 2023-08-12 DIAGNOSIS — R262 Difficulty in walking, not elsewhere classified: Secondary | ICD-10-CM | POA: Diagnosis not present

## 2023-08-12 DIAGNOSIS — M6281 Muscle weakness (generalized): Secondary | ICD-10-CM | POA: Diagnosis not present

## 2023-08-15 ENCOUNTER — Ambulatory Visit (HOSPITAL_COMMUNITY)
Admission: RE | Admit: 2023-08-15 | Discharge: 2023-08-15 | Disposition: A | Discharge status: Home / Self Care | Payer: Medicare Other | Source: Ambulatory Visit | Attending: Diagnostic Neuroimaging | Admitting: Diagnostic Neuroimaging

## 2023-08-15 DIAGNOSIS — M4802 Spinal stenosis, cervical region: Secondary | ICD-10-CM | POA: Diagnosis not present

## 2023-08-15 DIAGNOSIS — R292 Abnormal reflex: Secondary | ICD-10-CM | POA: Insufficient documentation

## 2023-08-15 DIAGNOSIS — R29898 Other symptoms and signs involving the musculoskeletal system: Secondary | ICD-10-CM | POA: Diagnosis not present

## 2023-08-15 DIAGNOSIS — R269 Unspecified abnormalities of gait and mobility: Secondary | ICD-10-CM

## 2023-08-15 DIAGNOSIS — R413 Other amnesia: Secondary | ICD-10-CM | POA: Diagnosis not present

## 2023-08-15 DIAGNOSIS — M4712 Other spondylosis with myelopathy, cervical region: Secondary | ICD-10-CM | POA: Diagnosis not present

## 2023-08-15 DIAGNOSIS — R29890 Loss of height: Secondary | ICD-10-CM | POA: Diagnosis not present

## 2023-08-15 DIAGNOSIS — G319 Degenerative disease of nervous system, unspecified: Secondary | ICD-10-CM | POA: Diagnosis not present

## 2023-08-15 DIAGNOSIS — I6782 Cerebral ischemia: Secondary | ICD-10-CM | POA: Diagnosis not present

## 2023-08-15 DIAGNOSIS — M5001 Cervical disc disorder with myelopathy,  high cervical region: Secondary | ICD-10-CM | POA: Diagnosis not present

## 2023-08-15 MED ORDER — GADOBUTROL 1 MMOL/ML IV SOLN
7.0000 mL | Freq: Once | INTRAVENOUS | Status: AC | PRN
  Administered 2023-08-15: 7 mL via INTRAVENOUS

## 2023-08-17 DIAGNOSIS — M6281 Muscle weakness (generalized): Secondary | ICD-10-CM | POA: Diagnosis not present

## 2023-08-17 DIAGNOSIS — R262 Difficulty in walking, not elsewhere classified: Secondary | ICD-10-CM | POA: Diagnosis not present

## 2023-08-19 DIAGNOSIS — M6281 Muscle weakness (generalized): Secondary | ICD-10-CM | POA: Diagnosis not present

## 2023-08-19 DIAGNOSIS — R262 Difficulty in walking, not elsewhere classified: Secondary | ICD-10-CM | POA: Diagnosis not present

## 2023-08-22 ENCOUNTER — Other Ambulatory Visit: Payer: Self-pay

## 2023-08-22 ENCOUNTER — Telehealth: Payer: Self-pay | Admitting: Diagnostic Neuroimaging

## 2023-08-22 ENCOUNTER — Telehealth: Payer: Self-pay | Admitting: Family Medicine

## 2023-08-22 MED ORDER — GEMTESA 75 MG PO TABS: 1.0000 | ORAL_TABLET | Freq: Every day | ORAL | 3 refills | Status: DC

## 2023-08-22 NOTE — Telephone Encounter (Signed)
Pt call and stated [facility] need a new RX fax to [phone] Vibegron (GEMTESA) 75 MG TABS

## 2023-08-22 NOTE — Telephone Encounter (Signed)
This RX is ready to be faxed

## 2023-08-22 NOTE — Telephone Encounter (Signed)
Wife is asking to be called with results to MRI as soon as they have been reviewed and resulted by Dr Marjory Lies

## 2023-08-22 NOTE — Telephone Encounter (Signed)
Pt Rx faxed and confirmation received

## 2023-08-24 ENCOUNTER — Ambulatory Visit: Payer: Medicare Other

## 2023-08-24 DIAGNOSIS — Z7901 Long term (current) use of anticoagulants: Secondary | ICD-10-CM | POA: Diagnosis not present

## 2023-08-24 LAB — POCT INR: INR: 2.4 (ref 2.0–3.0)

## 2023-08-24 MED ORDER — WARFARIN SODIUM 5 MG PO TABS: ORAL_TABLET | ORAL | 1 refills | Status: DC

## 2023-08-24 NOTE — Patient Instructions (Addendum)
Pre visit review using our clinic review tool, if applicable. No additional management support is needed unless otherwise documented below in the visit note.  Continue 1 tablet daily except take 1/2 tablet on Mondays, Wednesdays, and Fridays. Recheck in 6 weeks. 

## 2023-08-24 NOTE — Progress Notes (Signed)
Continue 1 tablet daily except take 1/2 tablet on Mondays, Wednesdays, and Fridays. Recheck in 6 weeks.  Pt is compliant with warfarin management and PCP apts.  Sent in refill of warfarin to requested pharmacy.

## 2023-08-26 DIAGNOSIS — I4821 Permanent atrial fibrillation: Secondary | ICD-10-CM | POA: Diagnosis not present

## 2023-08-26 DIAGNOSIS — Z45018 Encounter for adjustment and management of other part of cardiac pacemaker: Secondary | ICD-10-CM | POA: Diagnosis not present

## 2023-09-14 ENCOUNTER — Encounter: Payer: Self-pay | Admitting: Family Medicine

## 2023-09-14 ENCOUNTER — Ambulatory Visit: Payer: Medicare Other | Admitting: Family Medicine

## 2023-09-14 VITALS — BP 138/80 | HR 59 | Temp 98.0°F | Ht 64.0 in | Wt 170.0 lb

## 2023-09-14 DIAGNOSIS — N138 Other obstructive and reflux uropathy: Secondary | ICD-10-CM

## 2023-09-14 DIAGNOSIS — K219 Gastro-esophageal reflux disease without esophagitis: Secondary | ICD-10-CM

## 2023-09-14 DIAGNOSIS — E538 Deficiency of other specified B group vitamins: Secondary | ICD-10-CM

## 2023-09-14 DIAGNOSIS — I4811 Longstanding persistent atrial fibrillation: Secondary | ICD-10-CM | POA: Diagnosis not present

## 2023-09-14 DIAGNOSIS — R29898 Other symptoms and signs involving the musculoskeletal system: Secondary | ICD-10-CM

## 2023-09-14 DIAGNOSIS — N318 Other neuromuscular dysfunction of bladder: Secondary | ICD-10-CM

## 2023-09-14 DIAGNOSIS — I10 Essential (primary) hypertension: Secondary | ICD-10-CM

## 2023-09-14 DIAGNOSIS — R413 Other amnesia: Secondary | ICD-10-CM

## 2023-09-14 DIAGNOSIS — E785 Hyperlipidemia, unspecified: Secondary | ICD-10-CM

## 2023-09-14 DIAGNOSIS — Z7901 Long term (current) use of anticoagulants: Secondary | ICD-10-CM | POA: Diagnosis not present

## 2023-09-14 DIAGNOSIS — E1169 Type 2 diabetes mellitus with other specified complication: Secondary | ICD-10-CM | POA: Diagnosis not present

## 2023-09-14 DIAGNOSIS — N401 Enlarged prostate with lower urinary tract symptoms: Secondary | ICD-10-CM

## 2023-09-14 DIAGNOSIS — M353 Polymyalgia rheumatica: Secondary | ICD-10-CM

## 2023-09-14 DIAGNOSIS — I2510 Atherosclerotic heart disease of native coronary artery without angina pectoris: Secondary | ICD-10-CM

## 2023-09-14 LAB — CBC WITH DIFFERENTIAL/PLATELET
Basophils Absolute: 0 10*3/uL (ref 0.0–0.1)
Basophils Relative: 0.5 % (ref 0.0–3.0)
Eosinophils Absolute: 0.1 10*3/uL (ref 0.0–0.7)
Eosinophils Relative: 1.2 % (ref 0.0–5.0)
HCT: 43.7 % (ref 39.0–52.0)
Hemoglobin: 14.9 g/dL (ref 13.0–17.0)
Lymphocytes Relative: 8.3 % — ABNORMAL LOW (ref 12.0–46.0)
Lymphs Abs: 0.7 10*3/uL (ref 0.7–4.0)
MCHC: 34 g/dL (ref 30.0–36.0)
MCV: 106.4 fL — ABNORMAL HIGH (ref 78.0–100.0)
Monocytes Absolute: 0.7 10*3/uL (ref 0.1–1.0)
Monocytes Relative: 8.6 % (ref 3.0–12.0)
Neutro Abs: 6.9 10*3/uL (ref 1.4–7.7)
Neutrophils Relative %: 81.4 % — ABNORMAL HIGH (ref 43.0–77.0)
Platelets: 155 10*3/uL (ref 150.0–400.0)
RBC: 4.11 Mil/uL — ABNORMAL LOW (ref 4.22–5.81)
RDW: 12.6 % (ref 11.5–15.5)
WBC: 8.4 10*3/uL (ref 4.0–10.5)

## 2023-09-14 LAB — HEPATIC FUNCTION PANEL
ALT: 17 U/L (ref 0–53)
AST: 24 U/L (ref 0–37)
Albumin: 4.5 g/dL (ref 3.5–5.2)
Alkaline Phosphatase: 72 U/L (ref 39–117)
Bilirubin, Direct: 0.2 mg/dL (ref 0.0–0.3)
Total Bilirubin: 1 mg/dL (ref 0.2–1.2)
Total Protein: 6.7 g/dL (ref 6.0–8.3)

## 2023-09-14 LAB — BASIC METABOLIC PANEL
BUN: 19 mg/dL (ref 6–23)
CO2: 31 meq/L (ref 19–32)
Calcium: 10.3 mg/dL (ref 8.4–10.5)
Chloride: 103 meq/L (ref 96–112)
Creatinine, Ser: 1.23 mg/dL (ref 0.40–1.50)
GFR: 54.46 mL/min — ABNORMAL LOW (ref >60.00)
Glucose, Bld: 122 mg/dL — ABNORMAL HIGH (ref 70–99)
Potassium: 4.5 meq/L (ref 3.5–5.1)
Sodium: 141 meq/L (ref 135–145)

## 2023-09-14 LAB — LIPID PANEL
Cholesterol: 254 mg/dL — ABNORMAL HIGH (ref 0–200)
HDL: 52.9 mg/dL (ref >39.00)
LDL Cholesterol: 171 mg/dL — ABNORMAL HIGH (ref 0–99)
NonHDL: 201.53
Total CHOL/HDL Ratio: 5
Triglycerides: 155 mg/dL — ABNORMAL HIGH (ref 0.0–149.0)
VLDL: 31 mg/dL (ref 0.0–40.0)

## 2023-09-14 LAB — HEMOGLOBIN A1C: Hgb A1c MFr Bld: 6.6 % — ABNORMAL HIGH (ref 4.6–6.5)

## 2023-09-14 LAB — VITAMIN B12: Vitamin B-12: 312 pg/mL (ref 211–911)

## 2023-09-14 LAB — CK: Total CK: 112 U/L (ref 7–232)

## 2023-09-14 LAB — TSH: TSH: 2.4 u[IU]/mL (ref 0.35–5.50)

## 2023-09-14 LAB — PSA: PSA: 2.93 ng/mL (ref 0.10–4.00)

## 2023-09-14 NOTE — Progress Notes (Signed)
 Subjective:    Patient ID: Tanner Hensley, male    DOB: 23-Mar-1941, 83 y.o.   MRN: [id number]  HPI Here with his wife to follow up on issues. His main concern today is muscular weakness, particularly in the legs. He sees Dr. Eduard Hanlon for neurologic care, and he recently had MRI scans of the brain and cervical spine. 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 He also has advanced microvascular ischemic changes throughout the brain. The cervical spine film showed old compression fractures to multiple vertebrae, mild foraminal stenoses, and mild spinal stenoses. After these results were back, Dr. Hanlon said to stay on his current regimen. Deatrice has completed 3 months of PT for leg strengthening and balance, but he says this has not helped him at all. He holds onto walls and furniture to get around his house, and he uses a rolling walker whenever he leaves home. The last imaging he had of his lumbar spine was a CT scan in 2018. Otherwise his memory loss seems to have stabilized, and he is pleased with how the Aricept  and Namenda  have helped him. He still has occasional leakage of urine, so he wears protective undergarments at all times.  His BP is stable. His chronic atrial fibrillation and CAD are stable.   Review of Systems  Constitutional: Negative.   HENT: Negative.    Eyes: Negative.   Respiratory: Negative.    Cardiovascular: Negative.   Gastrointestinal: Negative.   Genitourinary: Negative.   Musculoskeletal:  Positive for gait problem.  Skin: Negative.   Neurological:  Positive for weakness.  Psychiatric/Behavioral: Negative.         Objective:   Physical Exam Constitutional:      General: He is not in acute distress.    Appearance: He is well-developed. He is not diaphoretic.     Comments: Using a rolling walker. He needs assistance to walk, and he is unsteady on his feet    HENT:     Head: Normocephalic and atraumatic.     Right Ear: External ear normal.     Left Ear: External ear normal.     Nose: Nose normal.     Mouth/Throat:     Pharynx: No oropharyngeal exudate.  Eyes:     General: No scleral icterus.       Right eye: No discharge.        Left eye: No discharge.     Conjunctiva/sclera: Conjunctivae normal.     Pupils: Pupils are equal, round, and reactive to light.  Neck:     Thyroid : No thyromegaly.     Vascular: No JVD.     Trachea: No tracheal deviation.  Cardiovascular:     Rate and Rhythm: Normal rate. Rhythm irregular.     Pulses: Normal pulses.     Heart sounds: Normal heart sounds. No murmur heard.    No friction rub. No gallop.  Pulmonary:     Effort: Pulmonary effort is normal. No respiratory distress.     Breath sounds: Normal breath sounds. No wheezing or rales.  Chest:     Chest wall: No tenderness.  Abdominal:     General: Bowel sounds are normal. There is no distension.     Palpations: Abdomen is soft. There is no mass.     Tenderness: There is no abdominal tenderness. There is no guarding or rebound.  Genitourinary:    Penis: Normal. No tenderness.  Testes: Normal.  Musculoskeletal:        General: No tenderness. Normal range of motion.     Cervical back: Neck supple.  Lymphadenopathy:     Cervical: No cervical adenopathy.  Skin:    General: Skin is warm and dry.     Coloration: Skin is not pale.     Findings: No erythema or rash.  Neurological:     Mental Status: He is alert and oriented to person, place, and time.     Cranial Nerves: No cranial nerve deficit.     Motor: No abnormal muscle tone.     Coordination: Coordination normal.     Deep Tendon Reflexes: Reflexes are normal and symmetric. Reflexes normal.  Psychiatric:        Mood and Affect: Mood normal.        Behavior: Behavior normal.        Thought Content: Thought content normal.        Judgment: Judgment normal.           Assessment &  Plan:  He continues to struggle with leg weakness and balance. We will set up a lumbar spine MRI sometime soon. His memory loss is stable. His atrial fibrillation and CAD and HTN are stable. We will get fasting labs to check lipids, an A1c, etc. We spent a total of (35   ) minutes reviewing records and discussing these issues.  Garnette Olmsted, MD

## 2023-09-15 LAB — PROTIME-INR
INR: 1.7 — ABNORMAL HIGH
Prothrombin Time: 17.4 s — ABNORMAL HIGH (ref 9.0–11.5)

## 2023-10-03 ENCOUNTER — Ambulatory Visit (INDEPENDENT_AMBULATORY_CARE_PROVIDER_SITE_OTHER): Payer: Medicare Other

## 2023-10-03 DIAGNOSIS — Z7901 Long term (current) use of anticoagulants: Secondary | ICD-10-CM | POA: Diagnosis not present

## 2023-10-03 LAB — POCT INR: INR: 1.8 — AB (ref 2.0–3.0)

## 2023-10-03 NOTE — Patient Instructions (Addendum)
Pre visit review using our clinic review tool, if applicable. No additional management support is needed unless otherwise documented below in the visit note.  Increase dose today to take 1 tablet and then change weekly dose to take 1 tablet daily except take 1/2 tablet on Mondays and Fridays. Recheck in 3 weeks.

## 2023-10-03 NOTE — Progress Notes (Signed)
Pt had PCP apt on 1/8 and a lab INR was drawn with result of 1.7. The result was not forwarded to coumadin clinic.  Increase dose today to take 1 tablet and then change weekly dose to take 1 tablet daily except take 1/2 tablet on Mondays and Fridays. Recheck in 3 weeks.

## 2023-10-05 ENCOUNTER — Ambulatory Visit: Payer: Medicare Other

## 2023-10-21 ENCOUNTER — Ambulatory Visit: Payer: Medicare Other

## 2023-10-21 VITALS — Ht 64.0 in | Wt 166.0 lb

## 2023-10-21 DIAGNOSIS — Z0000 Encounter for general adult medical examination without abnormal findings: Secondary | ICD-10-CM

## 2023-10-21 NOTE — Patient Instructions (Addendum)
Mr. Rhoda , Thank you for taking time to come for your Medicare Wellness Visit. I appreciate your ongoing commitment to your health goals. Please review the following plan we discussed and let me know if I can assist you in the future.   Referrals/Orders/Follow-Ups/Clinician Recommendations:   This is a list of the screening recommended for you and due dates:  Health Maintenance  Topic Date Due   Yearly kidney health urinalysis for diabetes  08/10/2015   COVID-19 Vaccine (1 - 2024-25 season) Never done   Complete foot exam   08/27/2023   Eye exam for diabetics  02/07/2024*   Hemoglobin A1C  03/13/2024   Yearly kidney function blood test for diabetes  09/13/2024   Medicare Annual Wellness Visit  10/20/2024   Pneumonia Vaccine  Completed   Flu Shot  Completed   Zoster (Shingles) Vaccine  Completed   HPV Vaccine  Aged Out   DTaP/Tdap/Td vaccine  Discontinued  *Topic was postponed. The date shown is not the original due date.    Advanced directives: (Copy Requested) Please bring a copy of your health care power of attorney and living will to the office to be added to your chart at your convenience.  Next Medicare Annual Wellness Visit scheduled for next year: Yes

## 2023-10-21 NOTE — Progress Notes (Signed)
Subjective:   Tanner Hensley is a 83 y.o. male who presents for Medicare Annual/Subsequent preventive examination.  Visit Complete: Virtual I connected with  Tanner Hensley on 10/21/23 by a audio enabled telemedicine application and verified that I am speaking with the correct person using two identifiers.  Patient Location: Home  Provider Location: Home Office  I discussed the limitations of evaluation and management by telemedicine. The patient expressed understanding and agreed to proceed.  Vital Signs: Because this visit was a virtual/telehealth visit, some criteria may be missing or patient reported. Any vitals not documented were not able to be obtained and vitals that have been documented are patient reported.      Objective:    Today's Vitals   10/21/23 1503  Weight: 166 lb (75.3 kg)  Height: 5\' 4"  (1.626 m)   Body mass index is 28.49 kg/m.     10/21/2023    3:17 PM 10/11/2022    2:10 PM 07/14/2022    9:37 AM 10/07/2021   11:01 AM 11/05/2020   12:08 PM 10/25/2020    6:00 PM 10/25/2020   10:38 AM  Advanced Directives  Does Patient Have a Medical Advance Directive? Yes Yes Yes Yes No Yes Yes  Type of Estate agent of [place];Living will Healthcare Power of [place];Living will Living will Healthcare Power of [place];Living will  Healthcare Power of [place];Living will Healthcare Power of [place];Living will  Does patient want to make changes to medical advance directive?   No - Patient declined No - Patient declined No - Patient declined No - Patient declined   Copy of Healthcare Power of Attorney in Chart? No - copy requested No - copy requested  No - copy requested  No - copy requested   Would patient like information on creating a medical advance directive?     No - Patient declined      Current Medications (verified) Outpatient Encounter Medications as of 10/21/2023  Medication Sig   acetaminophen (TYLENOL) 500 MG tablet Take 1,000 mg by  mouth every 4 (four) hours as needed for mild pain (pain score 1-3).   alendronate (FOSAMAX) 70 MG tablet Take 1 tablet (70 mg total) by mouth every 7 (seven) days. Take with a full glass of water on an empty stomach.   ascorbic acid (VITAMIN C) 500 MG tablet Take 500 mg by mouth daily.   aspirin EC 81 MG tablet Take 81 mg by mouth daily.   atenolol (TENORMIN) 25 MG tablet Take 25 mg by mouth daily.   b complex vitamins tablet Take 1 tablet by mouth daily.   benzonatate (TESSALON) 200 MG capsule Take 1 capsule (200 mg total) by mouth every 6 (six) hours as needed for cough. (Patient not taking: Reported on 10/21/2023)   Biotin 5000 MCG CAPS Take 5,000 mcg by mouth daily.    CALCIUM CITRATE-VITAMIN D PO Take 1 tablet by mouth 2 (two) times daily. [phone]   Cholecalciferol (VITAMIN D3 PO) Take by mouth daily at 2 PM.   donepezil (ARICEPT) 10 MG tablet TAKE 1 TABLET BY MOUTH EVERY  NIGHT AT BEDTIME   memantine (NAMENDA) 10 MG tablet TAKE 1 TABLET BY MOUTH TWICE  DAILY   Multiple Vitamin (MULTIVITAMIN WITH MINERALS) TABS tablet Take 1 tablet by mouth daily. SENIOR MULTIVITAMIN   predniSONE (DELTASONE) 20 MG tablet Take 2 tablets (40 mg total) by mouth daily with breakfast. (Patient not taking: Reported on 10/21/2023)   Vibegron (GEMTESA) 75 MG TABS Take  1 tablet (75 mg total) by mouth daily.   VITAMIN D PO Take by mouth.   warfarin (COUMADIN) 5 MG tablet TAKE 1 TABLET BY MOUTH DAILY EXCEPT TAKE 1/2 TABLET BY  MOUTH ON MONDAYS, WEDNESDAYS AND FRIDAYS OR AS DIRECTED BY  ANTICOAGULATION CLINIC   No facility-administered encounter medications on file as of 10/21/2023.    Allergies (verified) Codeine, Hydrocodone-acetaminophen, Tramadol, Lipitor [atorvastatin], Robaxin [methocarbamol], and Zocor [simvastatin]   History: Past Medical History:  Diagnosis Date   Arthritis    sees Dr. Kellie Simmering   Atrial fibrillation [facility])    (Dr. Eliane Decree at [facility] and Dr. Ladona Ridgel at [facility]. Goes to  [facility] polymyalgia rheumatica (Dr. Kellie Simmering)   CHF (congestive heart failure) (HCC)    NOT SURE WHEN   Coronary artery disease    Diabetes mellitus type II    DIET CONTROLLED AND LOST 40 LBS   Dysrhythmia    chronic a=fib with cardiac ablations   GERD (gastroesophageal reflux disease)    Gout    High cholesterol    Hypertension    Insomnia    Memory loss    Neuromuscular disorder (HCC)    POLY MYALGIA   RHEUMATICA   Polymyalgia rheumatica (HCC)    sees Dr. Kellie Simmering    Presence of permanent cardiac pacemaker    [employer]  08/2011   Urticaria    Past Surgical History:  Procedure Laterality Date   ATRIAL FIBRILLATION ABLATION  2006, 2008, 2013   at [facility]  by Dr. Zara Chess SURGERY     CARDIAC CATHETERIZATION     has had multiple, but last one 2017 (10/13/2016)   COLONOSCOPY W/ BIOPSIES AND POLYPECTOMY  04/05/2017   per Dr. Wendall Papa, no polyps, no repeats needed    CORONARY ANGIOPLASTY WITH STENT PLACEMENT  ~ 2014   "he has 2 stents" (10/13/2016)   ESOPHAGOGASTRODUODENOSCOPY     INGUINAL HERNIA REPAIR Right 11/28/2009   per Dr. Manus Rudd   KNEE ARTHROSCOPY Left 02/03/2010   per Dr. Worthy Rancher   POSTERIOR LUMBAR FUSION  10/14/2016   SPINE SURGERY  10/13/2016   per Dr. Yevette Edwards, L3-4 fusion   TONSILLECTOMY     Family History  Problem Relation Age of Onset   Coronary artery disease Other    Hypertension Other    Heart disease Other    Heart disease Mother    Heart attack Father    Heart disease Sister    Diabetes Sister    Hypertension Sister    Other Brother        brain tumor   Hypertension Brother    Heart disease Brother    Social History   Socioeconomic History   Marital status: Married    Spouse name: [name]   Number of children: 3   Years of education: 14   Highest education level: Not on file  Occupational History    Comment: retired  Tobacco Use   Smoking status: Never   Smokeless tobacco: Never  Vaping  Use   Vaping status: Never Used  Substance and Sexual Activity   Alcohol use: No    Alcohol/week: 0.0 standard drinks of alcohol   Drug use: No   Sexual activity: Never  Other Topics Concern   Not on file  Social History Narrative   Lives with wife, at home   Caffeine- coffee, 2 cups, Dr Reino Kent 8 oz   Social Drivers of Health   Financial Resource Strain: Low Risk  (  10/21/2023)   Overall Financial Resource Strain (CARDIA)    Difficulty of Paying Living Expenses: Not hard at all  Food Insecurity: No Food Insecurity (10/21/2023)   Hunger Vital Sign    Worried About Running Out of Food in the Last Year: Never true    Ran Out of Food in the Last Year: Never true  Transportation Needs: No Transportation Needs (10/21/2023)   PRAPARE - Administrator, Civil Service (Medical): No    Lack of Transportation (Non-Medical): No  Physical Activity: Insufficiently Active (10/21/2023)   Exercise Vital Sign    Days of Exercise per Week: 3 days    Minutes of Exercise per Session: 40 min  Stress: No Stress Concern Present (10/21/2023)   [employer] of Occupational Health - Occupational Stress Questionnaire    Feeling of Stress : Not at all  Social Connections: Socially Integrated (10/21/2023)   Social Connection and Isolation Panel [NHANES]    Frequency of Communication with Friends and Family: More than three times a week    Frequency of Social Gatherings with Friends and Family: More than three times a week    Attends Religious Services: More than 4 times per year    Active Member of [employer] or Organizations: Yes    Attends Engineer, structural: More than 4 times per year    Marital Status: Married    Tobacco Counseling Counseling given: Not Answered   Clinical Intake:  Pre-visit preparation completed: Yes  Pain : No/denies pain     BMI - recorded: 28.49 Nutritional Status: BMI 25 -29 Overweight Nutritional Risks: None Diabetes: No  How often do you need to  have someone help you when you read instructions, pamphlets, or other written materials from your doctor or pharmacy?: 1 - Never  Interpreter Needed?: No  Information entered by :: Theresa Mulligan LPN   Activities of Daily Living    10/21/2023    3:14 PM  In your present state of health, do you have any difficulty performing the following activities:  Hearing? 0  Vision? 0  Difficulty concentrating or making decisions? 0  Walking or climbing stairs? 1  Comment Uses a Walker  Dressing or bathing? 0  Doing errands, shopping? 0  Preparing Food and eating ? N  Using the Toilet? N  In the past six months, have you accidently leaked urine? Y  Comment Wears Breifs. Followed by PCP  Do you have problems with loss of bowel control? N  Managing your Medications? N  Managing your Finances? N  Housekeeping or managing your Housekeeping? N    Patient Care Team: Nelwyn Salisbury, MD as PCP - General (Family Medicine) Gayla Doss, MD as Referring Physician (Cardiology)  Indicate any recent Medical Services you may have received from other than [facility] providers in the past year (date may be approximate).     Assessment:   This is a routine wellness examination for [place].  Hearing/Vision screen Hearing Screening - Comments:: Denies hearing difficulties   Vision Screening - Comments:: Wears rx glasses - up to date with routine eye exams with Dr Martha Clan    Goals Addressed               This Visit's Progress     Stay Active (pt-stated)        Continue playing Disc Golf.       Depression Screen    10/21/2023    3:12 PM 10/11/2022    2:07 PM 08/20/2022  9:24 AM 02/09/2022   10:45 AM 10/07/2021   10:52 AM 09/15/2021   10:53 AM 08/11/2021    2:10 PM  PHQ 2/9 Scores  PHQ - 2 Score 0 0 0 1 0 0 0  PHQ- 9 Score  0 1 14 0 2 3    Fall Risk    10/21/2023    3:16 PM 09/14/2023    9:12 AM 10/11/2022    2:10 PM 08/20/2022    9:24 AM 02/09/2022   10:44 AM  Fall Risk   Falls in the  past year? 1 1 0 0 0  Number falls in past yr: 0 1 0 0 0  Injury with Fall? 0 0 0 0 0  Risk for fall due to : No Fall Risks History of fall(s) No Fall Risks No Fall Risks No Fall Risks  Follow up Falls prevention discussed;Falls evaluation completed Falls evaluation completed Falls prevention discussed Falls evaluation completed Falls evaluation completed    MEDICARE RISK AT HOME: Medicare Risk at Home Any stairs in or around the home?: Yes If so, are there any without handrails?: No Home free of loose throw rugs in walkways, pet beds, electrical cords, etc?: Yes Adequate lighting in your home to reduce risk of falls?: Yes Life alert?: No Use of a cane, walker or w/c?: Yes Grab bars in the bathroom?: Yes Shower chair or bench in shower?: Yes Elevated toilet seat or a handicapped toilet?: No  TIMED UP AND GO:  Was the test performed?  No    Cognitive Function:    06/05/2018    8:19 AM  MMSE - Mini Mental State Exam  Orientation to time 3  Orientation to Place 4  Registration 3  Attention/ Calculation 1  Recall 2  Language- name 2 objects 2  Language- repeat 0  Language- follow 3 step command 3  Language- read & follow direction 1  Write a sentence 1  Copy design 1  Total score 21      11/14/2014    8:45 AM  Montreal Cognitive Assessment   Visuospatial/ Executive (0/5) 5  Naming (0/3) 3  Attention: Read list of digits (0/2) 2  Attention: Read list of letters (0/1) 1  Attention: Serial 7 subtraction starting at 100 (0/3) 3  Language: Repeat phrase (0/2) 2  Language : Fluency (0/1) 1  Abstraction (0/2) 2  Delayed Recall (0/5) 4  Orientation (0/6) 6  Total 29  Adjusted Score (based on education) 29      10/21/2023    3:17 PM 10/11/2022    2:11 PM 10/07/2021   10:59 AM 09/24/2020    1:18 PM  6CIT Screen  What Year? 0 points 0 points 0 points 0 points  What month? 0 points 0 points 0 points 0 points  What time? 0 points 0 points 0 points   Count back from 20 0  points 0 points 0 points 0 points  Months in reverse 0 points 0 points 0 points 0 points  Repeat phrase 6 points 0 points 0 points 0 points  Total Score 6 points 0 points 0 points     Immunizations Immunization History  Administered Date(s) Administered   Fluad Quad(high Dose 65+) 06/15/2019, 06/24/2020, 07/15/2021, 06/28/2022   Fluad Trivalent(High Dose 65+) 06/22/2023   Influenza Split 06/07/2011, 06/21/2012   Influenza Whole 07/07/2005, 06/09/2007, 06/07/2008, 06/18/2009, 07/13/2010   Influenza, High Dose Seasonal PF 06/15/2016, 06/23/2017, 07/24/2018   Influenza,inj,Quad PF,6+ Mos 07/26/2013, 05/20/2014, 04/28/2015   Influenza-Unspecified 06/07/2011, 06/21/2012,  07/26/2013, 05/20/2014, 04/28/2015, 06/15/2016, 06/23/2017, 07/24/2018   Pneumococcal Conjugate-13 08/09/2014   Pneumococcal Polysaccharide-23 07/20/2012   Pneumococcal-Unspecified 07/20/2012, 08/09/2014   Td 07/13/2010   Tdap 07/13/2010, 07/05/2022   Zoster Recombinant(Shingrix) 01/15/2022, 03/22/2022    TDAP status: Up to date  Flu Vaccine status: Up to date  Pneumococcal vaccine status: Up to date  Covid-19 vaccine status: Declined, Education has been provided regarding the importance of this vaccine but patient still declined. Advised may receive this vaccine at local pharmacy or Health Dept.or vaccine clinic. Aware to provide a copy of the vaccination record if obtained from local pharmacy or Health Dept. Verbalized acceptance and understanding.  Qualifies for Shingles Vaccine? Yes   Zostavax completed Yes   Shingrix Completed?: Yes  Screening Tests Health Maintenance  Topic Date Due   Diabetic kidney evaluation - Urine ACR  08/10/2015   COVID-19 Vaccine (1 - 2024-25 season) Never done   FOOT EXAM  08/27/2023   OPHTHALMOLOGY EXAM  02/07/2024 (Originally 10/06/2021)   HEMOGLOBIN A1C  03/13/2024   Diabetic kidney evaluation - eGFR measurement  09/13/2024   Medicare Annual Wellness (AWV)  10/20/2024    Pneumonia Vaccine 66+ Years old  Completed   INFLUENZA VACCINE  Completed   Zoster Vaccines- Shingrix  Completed   HPV VACCINES  Aged Out   DTaP/Tdap/Td  Discontinued    Health Maintenance  Health Maintenance Due  Topic Date Due   Diabetic kidney evaluation - Urine ACR  08/10/2015   COVID-19 Vaccine (1 - 2024-25 season) Never done   FOOT EXAM  08/27/2023    Additional Screening:    Vision Screening: Recommended annual ophthalmology exams for early detection of glaucoma and other disorders of the eye. Is the patient up to date with their annual eye exam?  Yes  Who is the provider or what is the name of the office in which the patient attends annual eye exams? Dr Martha Clan If pt is not established with a provider, would they like to be referred to a provider to establish care? No .   Dental Screening: Recommended annual dental exams for proper oral hygiene    Community Resource Referral / Chronic Care Management:  CRR required this visit?  No   CCM required this visit?  No     Plan:     I have personally reviewed and noted the following in the patient's chart:   Medical and social history Use of alcohol, tobacco or illicit drugs  Current medications and supplements including opioid prescriptions. Patient is not currently taking opioid prescriptions. Functional ability and status Nutritional status Physical activity Advanced directives List of other physicians Hospitalizations, surgeries, and ER visits in previous 12 months Vitals Screenings to include cognitive, depression, and falls Referrals and appointments  In addition, I have reviewed and discussed with patient certain preventive protocols, quality metrics, and best practice recommendations. A written personalized care plan for preventive services as well as general preventive health recommendations were provided to patient.     Tillie Rung, LPN   05/04/5620   After Visit Summary: (MyChart) Due to this  being a telephonic visit, the after visit summary with patients personalized plan was offered to patient via MyChart   Nurse Notes: None

## 2023-10-24 ENCOUNTER — Ambulatory Visit: Payer: Medicare Other

## 2023-10-24 ENCOUNTER — Ambulatory Visit (INDEPENDENT_AMBULATORY_CARE_PROVIDER_SITE_OTHER): Payer: Medicare Other

## 2023-10-24 DIAGNOSIS — Z7901 Long term (current) use of anticoagulants: Secondary | ICD-10-CM

## 2023-10-24 LAB — POCT INR: INR: 2.8 (ref 2.0–3.0)

## 2023-10-24 NOTE — Patient Instructions (Addendum)
Pre visit review using our clinic review tool, if applicable. No additional management support is needed unless otherwise documented below in the visit note.  Continue 1 tablet daily except take 1/2 tablet on Mondays and Fridays. Recheck in 4 weeks.  

## 2023-10-24 NOTE — Progress Notes (Signed)
Continue 1 tablet daily except take 1/2 tablet on Mondays and Fridays. Recheck in 4 weeks.

## 2023-10-25 ENCOUNTER — Ambulatory Visit (HOSPITAL_COMMUNITY)
Admission: RE | Admit: 2023-10-25 | Discharge: 2023-10-25 | Disposition: A | Discharge status: Home / Self Care | Payer: Medicare Other | Source: Ambulatory Visit | Attending: Family Medicine | Admitting: Family Medicine

## 2023-10-25 DIAGNOSIS — M0609 Rheumatoid arthritis without rheumatoid factor, multiple sites: Secondary | ICD-10-CM | POA: Diagnosis not present

## 2023-10-25 DIAGNOSIS — M4726 Other spondylosis with radiculopathy, lumbar region: Secondary | ICD-10-CM | POA: Diagnosis not present

## 2023-10-25 DIAGNOSIS — M79641 Pain in right hand: Secondary | ICD-10-CM | POA: Diagnosis not present

## 2023-10-25 DIAGNOSIS — M5117 Intervertebral disc disorders with radiculopathy, lumbosacral region: Secondary | ICD-10-CM | POA: Diagnosis not present

## 2023-10-25 DIAGNOSIS — M1991 Primary osteoarthritis, unspecified site: Secondary | ICD-10-CM | POA: Diagnosis not present

## 2023-10-25 DIAGNOSIS — M5116 Intervertebral disc disorders with radiculopathy, lumbar region: Secondary | ICD-10-CM | POA: Diagnosis not present

## 2023-10-25 DIAGNOSIS — M48061 Spinal stenosis, lumbar region without neurogenic claudication: Secondary | ICD-10-CM | POA: Diagnosis not present

## 2023-10-25 DIAGNOSIS — R29898 Other symptoms and signs involving the musculoskeletal system: Secondary | ICD-10-CM | POA: Insufficient documentation

## 2023-10-25 DIAGNOSIS — M79642 Pain in left hand: Secondary | ICD-10-CM | POA: Diagnosis not present

## 2023-10-25 DIAGNOSIS — M353 Polymyalgia rheumatica: Secondary | ICD-10-CM | POA: Diagnosis not present

## 2023-10-25 MED ORDER — GADOBUTROL 1 MMOL/ML IV SOLN
7.0000 mL | Freq: Once | INTRAVENOUS | Status: AC | PRN
  Administered 2023-10-25: 7 mL via INTRAVENOUS

## 2023-11-21 ENCOUNTER — Ambulatory Visit: Payer: Medicare Other

## 2023-11-21 ENCOUNTER — Ambulatory Visit (INDEPENDENT_AMBULATORY_CARE_PROVIDER_SITE_OTHER): Payer: Medicare Other

## 2023-11-21 DIAGNOSIS — Z7901 Long term (current) use of anticoagulants: Secondary | ICD-10-CM

## 2023-11-21 LAB — POCT INR: INR: 2.3 (ref 2.0–3.0)

## 2023-11-21 NOTE — Progress Notes (Signed)
Continue 1 tablet daily except take 1/2 tablet on Mondays and Fridays. Recheck in 4 weeks.

## 2023-11-21 NOTE — Patient Instructions (Addendum)
Pre visit review using our clinic review tool, if applicable. No additional management support is needed unless otherwise documented below in the visit note.  Continue 1 tablet daily except take 1/2 tablet on Mondays and Fridays. Recheck in 4 weeks.  

## 2023-12-02 ENCOUNTER — Other Ambulatory Visit: Payer: Self-pay | Admitting: Family Medicine

## 2023-12-19 ENCOUNTER — Ambulatory Visit (INDEPENDENT_AMBULATORY_CARE_PROVIDER_SITE_OTHER)

## 2023-12-19 DIAGNOSIS — Z7901 Long term (current) use of anticoagulants: Secondary | ICD-10-CM | POA: Diagnosis not present

## 2023-12-19 LAB — POCT INR: INR: 2.2 (ref 2.0–3.0)

## 2023-12-19 NOTE — Patient Instructions (Addendum)
 Pre visit review using our clinic review tool, if applicable. No additional management support is needed unless otherwise documented below in the visit note.  Continue 1 tablet daily except take 1/2 tablet on Mondays and Fridays. Recheck in 5 weeks.

## 2023-12-19 NOTE — Progress Notes (Signed)
Continue 1 tablet daily except take 1/2 tablet on Mondays and Fridays. Recheck in 5 weeks.

## 2024-01-25 ENCOUNTER — Ambulatory Visit (INDEPENDENT_AMBULATORY_CARE_PROVIDER_SITE_OTHER)

## 2024-01-25 DIAGNOSIS — Z7901 Long term (current) use of anticoagulants: Secondary | ICD-10-CM | POA: Diagnosis not present

## 2024-01-25 LAB — POCT INR: INR: 2.7 (ref 2.0–3.0)

## 2024-01-25 NOTE — Progress Notes (Signed)
Continue 1 tablet daily except take 1/2 tablet on Mondays and Fridays. Recheck in 6 weeks.  

## 2024-01-25 NOTE — Patient Instructions (Addendum)
Pre visit review using our clinic review tool, if applicable. No additional management support is needed unless otherwise documented below in the visit note.  Continue 1 tablet daily except take 1/2 tablet on Mondays and Fridays. Recheck in 6 weeks.  

## 2024-01-29 ENCOUNTER — Other Ambulatory Visit: Payer: Self-pay | Admitting: Family Medicine

## 2024-01-29 DIAGNOSIS — R413 Other amnesia: Secondary | ICD-10-CM

## 2024-02-07 DIAGNOSIS — D3132 Benign neoplasm of left choroid: Secondary | ICD-10-CM | POA: Diagnosis not present

## 2024-02-11 DIAGNOSIS — Z934 Other artificial openings of gastrointestinal tract status: Secondary | ICD-10-CM | POA: Diagnosis not present

## 2024-02-11 DIAGNOSIS — Z7901 Long term (current) use of anticoagulants: Secondary | ICD-10-CM | POA: Diagnosis not present

## 2024-02-11 DIAGNOSIS — Z743 Need for continuous supervision: Secondary | ICD-10-CM | POA: Diagnosis not present

## 2024-02-11 DIAGNOSIS — D696 Thrombocytopenia, unspecified: Secondary | ICD-10-CM | POA: Diagnosis not present

## 2024-02-11 DIAGNOSIS — I495 Sick sinus syndrome: Secondary | ICD-10-CM | POA: Diagnosis not present

## 2024-02-11 DIAGNOSIS — I110 Hypertensive heart disease with heart failure: Secondary | ICD-10-CM | POA: Diagnosis not present

## 2024-02-11 DIAGNOSIS — Z4682 Encounter for fitting and adjustment of non-vascular catheter: Secondary | ICD-10-CM | POA: Diagnosis not present

## 2024-02-11 DIAGNOSIS — M109 Gout, unspecified: Secondary | ICD-10-CM | POA: Diagnosis not present

## 2024-02-11 DIAGNOSIS — K219 Gastro-esophageal reflux disease without esophagitis: Secondary | ICD-10-CM | POA: Diagnosis not present

## 2024-02-11 DIAGNOSIS — G911 Obstructive hydrocephalus: Secondary | ICD-10-CM | POA: Diagnosis not present

## 2024-02-11 DIAGNOSIS — I2510 Atherosclerotic heart disease of native coronary artery without angina pectoris: Secondary | ICD-10-CM | POA: Diagnosis not present

## 2024-02-11 DIAGNOSIS — Z136 Encounter for screening for cardiovascular disorders: Secondary | ICD-10-CM | POA: Diagnosis not present

## 2024-02-11 DIAGNOSIS — I615 Nontraumatic intracerebral hemorrhage, intraventricular: Secondary | ICD-10-CM | POA: Diagnosis not present

## 2024-02-11 DIAGNOSIS — Z4659 Encounter for fitting and adjustment of other gastrointestinal appliance and device: Secondary | ICD-10-CM | POA: Diagnosis not present

## 2024-02-11 DIAGNOSIS — I4891 Unspecified atrial fibrillation: Secondary | ICD-10-CM | POA: Diagnosis not present

## 2024-02-11 DIAGNOSIS — R1111 Vomiting without nausea: Secondary | ICD-10-CM | POA: Diagnosis not present

## 2024-02-11 DIAGNOSIS — I610 Nontraumatic intracerebral hemorrhage in hemisphere, subcortical: Secondary | ICD-10-CM | POA: Diagnosis not present

## 2024-02-11 DIAGNOSIS — G8194 Hemiplegia, unspecified affecting left nondominant side: Secondary | ICD-10-CM | POA: Diagnosis not present

## 2024-02-11 DIAGNOSIS — I712 Thoracic aortic aneurysm, without rupture, unspecified: Secondary | ICD-10-CM | POA: Diagnosis not present

## 2024-02-11 DIAGNOSIS — G936 Cerebral edema: Secondary | ICD-10-CM | POA: Diagnosis not present

## 2024-02-11 DIAGNOSIS — I161 Hypertensive emergency: Secondary | ICD-10-CM | POA: Diagnosis not present

## 2024-02-11 DIAGNOSIS — R1110 Vomiting, unspecified: Secondary | ICD-10-CM | POA: Diagnosis not present

## 2024-02-11 DIAGNOSIS — Z95810 Presence of automatic (implantable) cardiac defibrillator: Secondary | ICD-10-CM | POA: Diagnosis not present

## 2024-02-11 DIAGNOSIS — I619 Nontraumatic intracerebral hemorrhage, unspecified: Secondary | ICD-10-CM | POA: Diagnosis not present

## 2024-02-11 DIAGNOSIS — R9431 Abnormal electrocardiogram [ECG] [EKG]: Secondary | ICD-10-CM | POA: Diagnosis not present

## 2024-02-11 DIAGNOSIS — I442 Atrioventricular block, complete: Secondary | ICD-10-CM | POA: Diagnosis not present

## 2024-02-11 DIAGNOSIS — I4821 Permanent atrial fibrillation: Secondary | ICD-10-CM | POA: Diagnosis not present

## 2024-02-11 DIAGNOSIS — I6389 Other cerebral infarction: Secondary | ICD-10-CM | POA: Diagnosis not present

## 2024-02-11 DIAGNOSIS — J9811 Atelectasis: Secondary | ICD-10-CM | POA: Diagnosis not present

## 2024-02-11 DIAGNOSIS — I639 Cerebral infarction, unspecified: Secondary | ICD-10-CM | POA: Diagnosis not present

## 2024-02-11 DIAGNOSIS — J8403 Idiopathic pulmonary hemosiderosis: Secondary | ICD-10-CM | POA: Diagnosis not present

## 2024-02-11 DIAGNOSIS — R531 Weakness: Secondary | ICD-10-CM | POA: Diagnosis not present

## 2024-02-11 DIAGNOSIS — I629 Nontraumatic intracranial hemorrhage, unspecified: Secondary | ICD-10-CM | POA: Diagnosis not present

## 2024-02-11 DIAGNOSIS — E876 Hypokalemia: Secondary | ICD-10-CM | POA: Diagnosis not present

## 2024-02-11 DIAGNOSIS — M353 Polymyalgia rheumatica: Secondary | ICD-10-CM | POA: Diagnosis not present

## 2024-02-11 DIAGNOSIS — R000 Tachycardia, unspecified: Secondary | ICD-10-CM | POA: Diagnosis not present

## 2024-02-11 DIAGNOSIS — I10 Essential (primary) hypertension: Secondary | ICD-10-CM | POA: Diagnosis not present

## 2024-02-11 DIAGNOSIS — I5032 Chronic diastolic (congestive) heart failure: Secondary | ICD-10-CM | POA: Diagnosis not present

## 2024-02-11 DIAGNOSIS — I611 Nontraumatic intracerebral hemorrhage in hemisphere, cortical: Secondary | ICD-10-CM | POA: Diagnosis not present

## 2024-02-11 DIAGNOSIS — Z982 Presence of cerebrospinal fluid drainage device: Secondary | ICD-10-CM | POA: Diagnosis not present

## 2024-02-11 DIAGNOSIS — Z978 Presence of other specified devices: Secondary | ICD-10-CM | POA: Diagnosis not present

## 2024-02-11 DIAGNOSIS — Z515 Encounter for palliative care: Secondary | ICD-10-CM | POA: Diagnosis not present

## 2024-02-11 DIAGNOSIS — Z66 Do not resuscitate: Secondary | ICD-10-CM | POA: Diagnosis not present

## 2024-02-11 DIAGNOSIS — I618 Other nontraumatic intracerebral hemorrhage: Secondary | ICD-10-CM | POA: Diagnosis not present

## 2024-02-13 DIAGNOSIS — Z978 Presence of other specified devices: Secondary | ICD-10-CM | POA: Diagnosis not present

## 2024-02-13 DIAGNOSIS — Z934 Other artificial openings of gastrointestinal tract status: Secondary | ICD-10-CM | POA: Diagnosis not present

## 2024-02-14 ENCOUNTER — Telehealth: Payer: Self-pay

## 2024-02-14 NOTE — Telephone Encounter (Signed)
 Copied from CRM [id number]. Topic: General - Other >> Feb 14, 2024 11:01 AM Luane Rumps D wrote: Reason for CRM: Patient's wife called to advise that Tanner Hensley has had a stroke and they are removing his life support tomorrow. She thought Dr. Alyne Babinski would like to know.

## 2024-02-14 NOTE — Telephone Encounter (Signed)
 I am so sorry to hear this. Please let the family know we are praying for them. Let us  know if we can help in any way

## 2024-02-14 NOTE — Telephone Encounter (Signed)
 Spoke with pt wife offered our prayers for pt who is currently in the hospital. Pt wife was grateful for our concern.

## 2024-03-06 DEATH — deceased

## 2024-03-07 ENCOUNTER — Ambulatory Visit: Payer: Self-pay

## 2024-03-07 ENCOUNTER — Ambulatory Visit

## 2024-03-07 NOTE — Telephone Encounter (Signed)
 INR reminder in basket today. Review of chart showed pt passed on 2024-02-19. LVM for pt's wife with condolences.

## 2024-03-07 NOTE — Progress Notes (Signed)
 Pt passed on 02/21/24. Resolving anticoagulation encounters.

## 2024-04-23 ENCOUNTER — Encounter: Payer: Self-pay | Admitting: Family Medicine

## 2024-04-26 NOTE — Telephone Encounter (Signed)
 Stopped the messages that I could. If this continues then pt needs to call MyChart Help Desk [phone]  [facility]

## 2024-10-26 ENCOUNTER — Ambulatory Visit: Payer: Medicare Other
